# Patient Record
Sex: Female | Born: 1955 | Race: White | Hispanic: No | Marital: Married | Smoking: Former smoker
Health system: Western US, Academic
[De-identification: ages and names within clinical notes are randomized; demographics above are authoritative.]

## PROBLEM LIST (undated history)

## (undated) DIAGNOSIS — F419 Anxiety disorder, unspecified: Secondary | ICD-10-CM

## (undated) DIAGNOSIS — J45909 Unspecified asthma, uncomplicated: Secondary | ICD-10-CM

## (undated) DIAGNOSIS — I7300 Raynaud's syndrome without gangrene: Secondary | ICD-10-CM

## (undated) DIAGNOSIS — K219 Gastro-esophageal reflux disease without esophagitis: Secondary | ICD-10-CM

## (undated) DIAGNOSIS — I10 Essential (primary) hypertension: Secondary | ICD-10-CM

## (undated) DIAGNOSIS — B182 Chronic viral hepatitis C: Secondary | ICD-10-CM

## (undated) DIAGNOSIS — N951 Menopausal and female climacteric states: Secondary | ICD-10-CM

## (undated) DIAGNOSIS — J302 Other seasonal allergic rhinitis: Secondary | ICD-10-CM

## (undated) DIAGNOSIS — D582 Other hemoglobinopathies: Secondary | ICD-10-CM

## (undated) DIAGNOSIS — I341 Nonrheumatic mitral (valve) prolapse: Secondary | ICD-10-CM

## (undated) DIAGNOSIS — M349 Systemic sclerosis, unspecified: Secondary | ICD-10-CM

## (undated) DIAGNOSIS — Z9981 Dependence on supplemental oxygen: Secondary | ICD-10-CM

## (undated) DIAGNOSIS — K589 Irritable bowel syndrome without diarrhea: Secondary | ICD-10-CM

## (undated) DIAGNOSIS — J8410 Pulmonary fibrosis, unspecified: Secondary | ICD-10-CM

## (undated) DIAGNOSIS — B070 Plantar wart: Secondary | ICD-10-CM

## (undated) DIAGNOSIS — T7840XA Allergy, unspecified, initial encounter: Secondary | ICD-10-CM

## (undated) DIAGNOSIS — M545 Low back pain, unspecified: Secondary | ICD-10-CM

## (undated) DIAGNOSIS — M4310 Spondylolisthesis, site unspecified: Secondary | ICD-10-CM

## (undated) DIAGNOSIS — G4700 Insomnia, unspecified: Secondary | ICD-10-CM

## (undated) DIAGNOSIS — D751 Secondary polycythemia: Secondary | ICD-10-CM

## (undated) DIAGNOSIS — M1990 Unspecified osteoarthritis, unspecified site: Secondary | ICD-10-CM

## (undated) HISTORY — DX: Unspecified osteoarthritis, unspecified site: M19.90

## (undated) HISTORY — DX: Pulmonary fibrosis, unspecified: J84.10

## (undated) HISTORY — DX: Spondylolisthesis, site unspecified: M43.10

## (undated) HISTORY — DX: Chronic viral hepatitis C: B18.2

## (undated) HISTORY — DX: Irritable bowel syndrome, unspecified: K58.9

## (undated) HISTORY — PX: ABDOMINAL HYSTERECTOMY: SHX81

## (undated) HISTORY — DX: Low back pain, unspecified: M54.50

## (undated) HISTORY — DX: Anxiety disorder, unspecified: F41.9

## (undated) HISTORY — PX: VAGINAL HYSTERECTOMY: SHX2639

## (undated) HISTORY — DX: Menopausal and female climacteric states: N95.1

## (undated) HISTORY — DX: Nonrheumatic mitral (valve) prolapse: I34.1

## (undated) HISTORY — DX: Essential (primary) hypertension: I10

## (undated) HISTORY — DX: Unspecified asthma, uncomplicated: J45.909

## (undated) HISTORY — DX: Gastro-esophageal reflux disease without esophagitis: K21.9

## (undated) HISTORY — DX: Insomnia, unspecified: G47.00

## (undated) HISTORY — DX: Dependence on supplemental oxygen: Z99.81

## (undated) HISTORY — DX: Secondary polycythemia: D75.1

## (undated) HISTORY — DX: Allergy, unspecified, initial encounter: T78.40XA

## (undated) HISTORY — DX: Other hemoglobinopathies: D58.2

## (undated) HISTORY — DX: Systemic sclerosis, unspecified: M34.9

## (undated) HISTORY — DX: Low back pain: M54.5

## (undated) HISTORY — DX: Other seasonal allergic rhinitis: J30.2

## (undated) HISTORY — PX: LUNG TRANSPLANT, DOUBLE: SHX704

## (undated) HISTORY — DX: Plantar wart: B07.0

---

## 2003-10-29 ENCOUNTER — Other Ambulatory Visit: Payer: Self-pay

## 2004-12-23 ENCOUNTER — Ambulatory Visit: Payer: Self-pay

## 2005-01-31 ENCOUNTER — Ambulatory Visit: Payer: Self-pay

## 2005-07-09 ENCOUNTER — Encounter: Payer: Self-pay | Admitting: Family Medicine

## 2005-07-10 ENCOUNTER — Encounter: Payer: Self-pay | Admitting: Family Medicine

## 2005-08-07 ENCOUNTER — Encounter: Payer: Self-pay | Admitting: Family Medicine

## 2005-09-22 ENCOUNTER — Ambulatory Visit: Payer: Self-pay | Admitting: Family Medicine

## 2005-10-08 ENCOUNTER — Ambulatory Visit: Payer: Self-pay

## 2005-12-26 ENCOUNTER — Other Ambulatory Visit: Payer: Self-pay

## 2005-12-29 ENCOUNTER — Inpatient Hospital Stay: Payer: Self-pay | Admitting: Obstetrics and Gynecology

## 2007-07-22 IMAGING — US US PELV - US TRANSVAGINAL
1 series · 17 of 25 positions shown · non-contrast
Comparison: none

REASON FOR EXAM: Abd and Pelvic Pain
COMMENTS:

[Series 1: us pelv - us transvaginal · 17 of 46 slices shown]
[im 1/46]
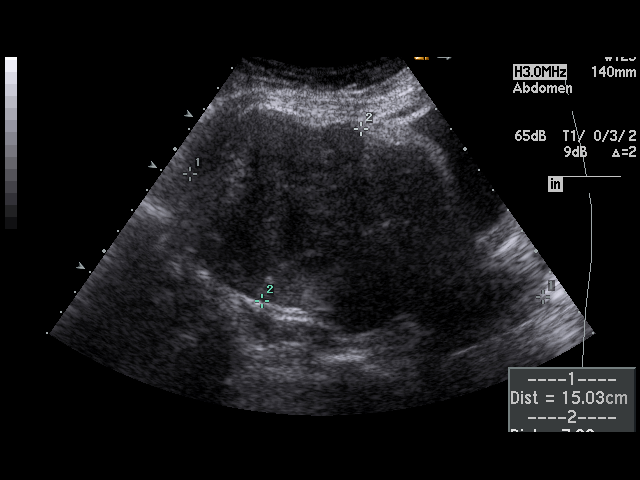
[im 4/46]
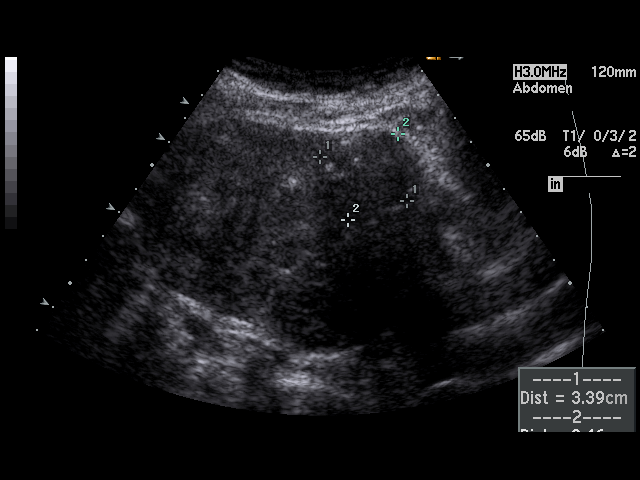
[im 6/46]
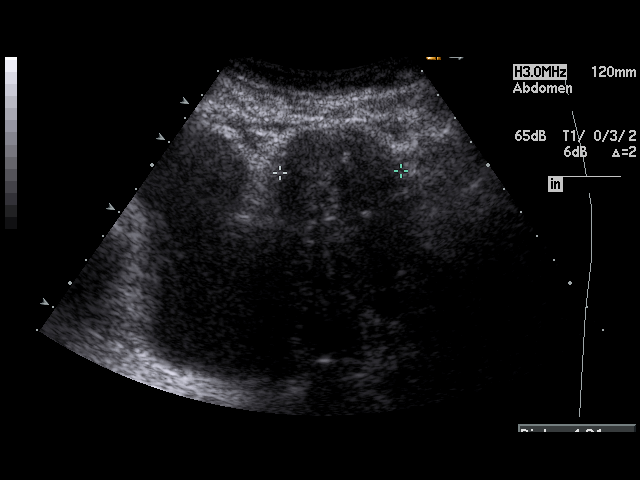
[im 10/46]
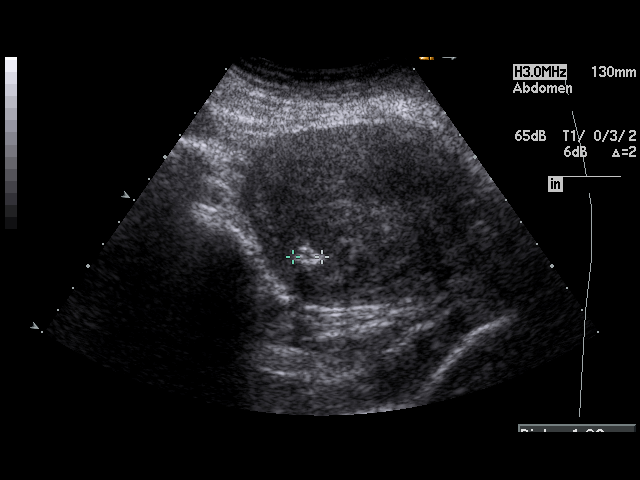
[im 12/46]
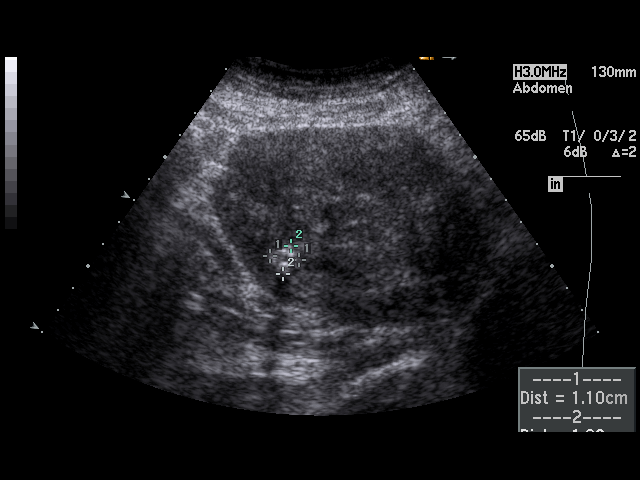
[im 16/46]
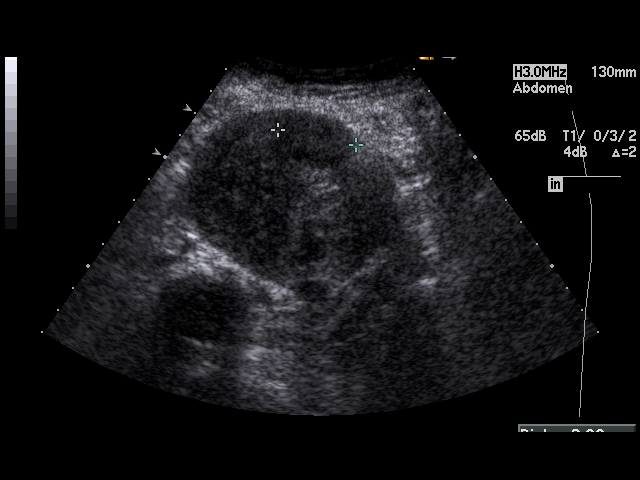
[im 17/46]
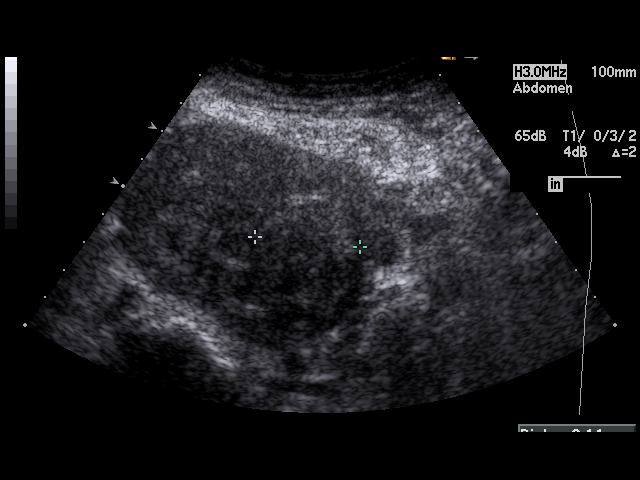
[im 21/46]
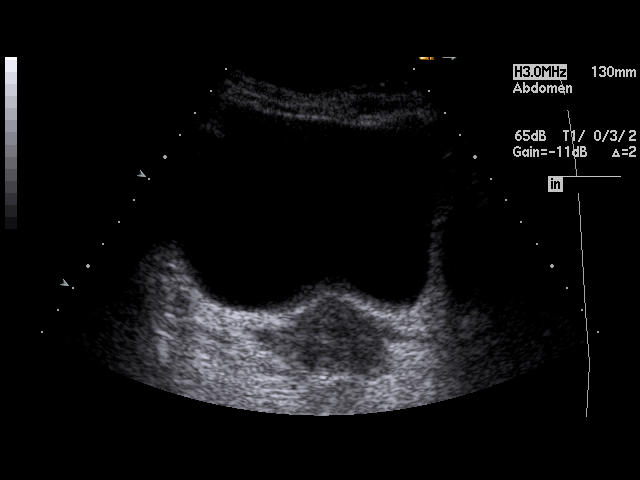
[im 23/46]
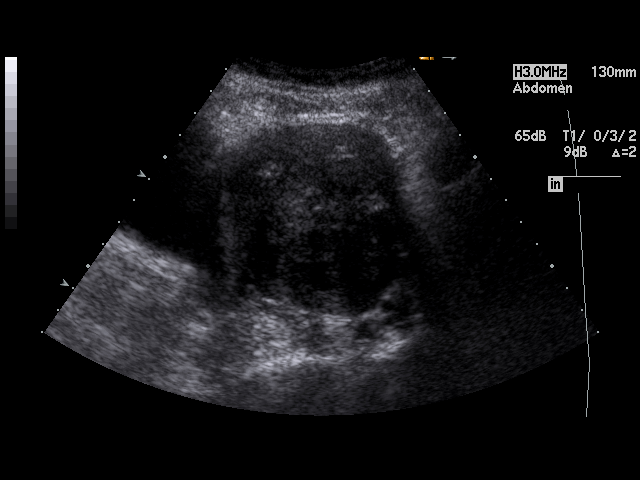
[im 25/46]
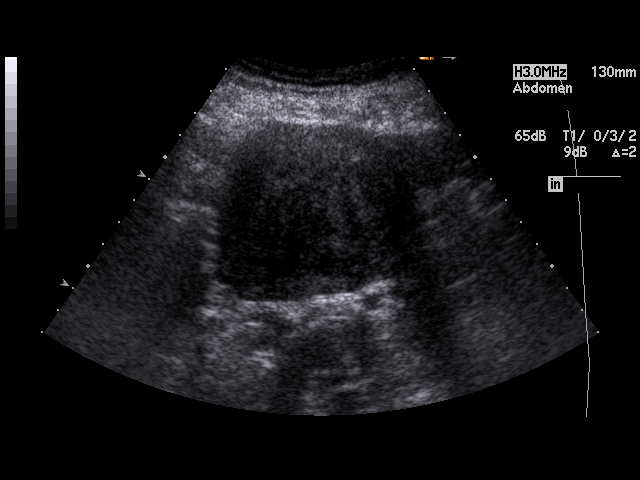
[im 29/46]
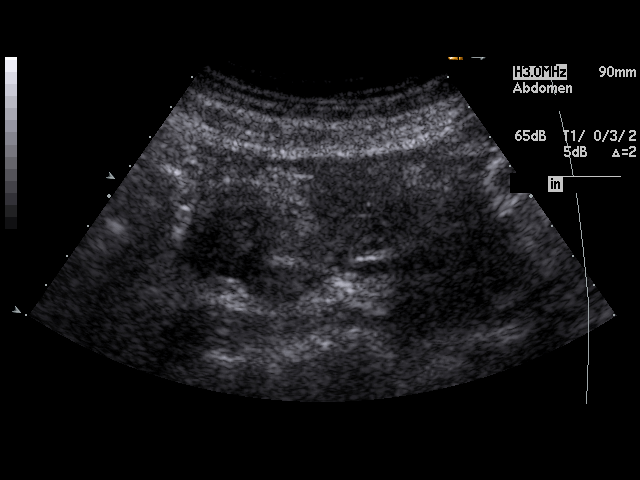
[im 31/46]
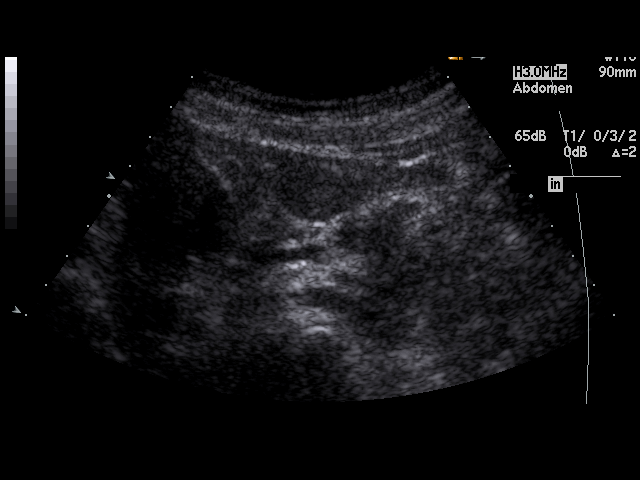
[im 34/46]
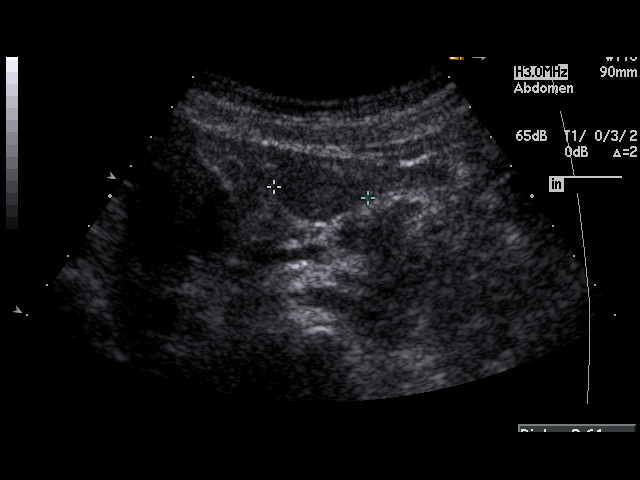
[im 36/46]
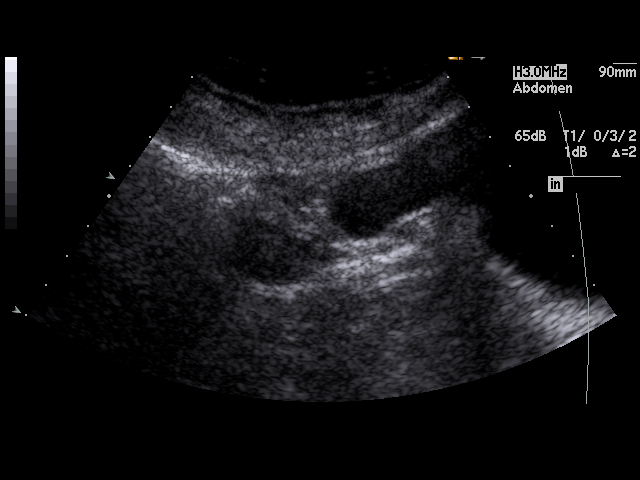
[im 40/46]
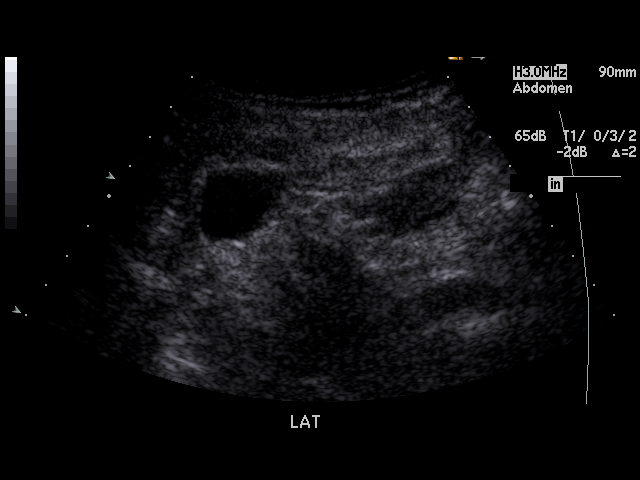
[im 42/46]
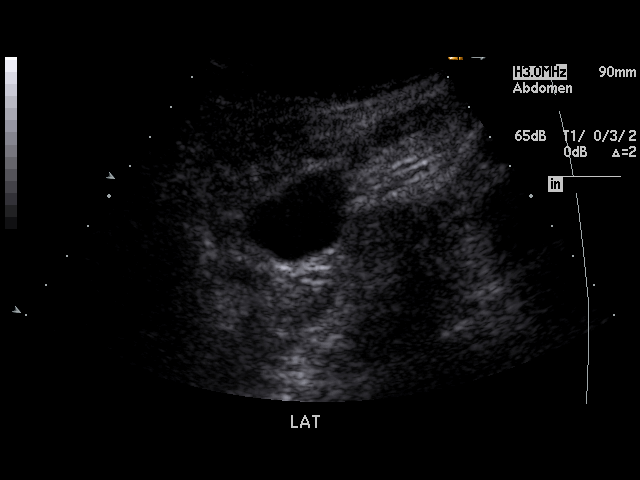
[im 46/46]
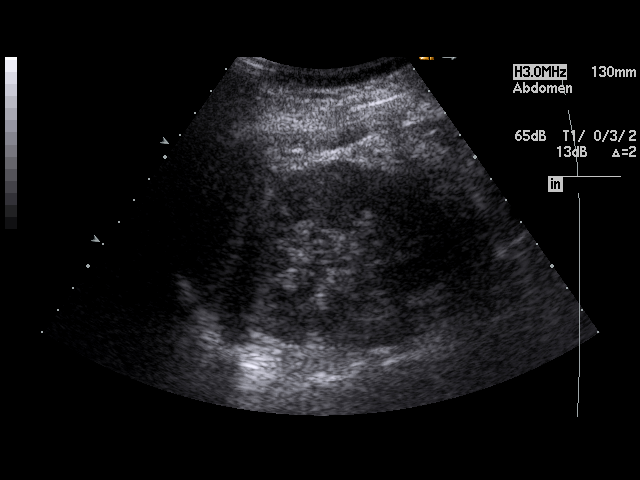

[17 of 25 positions shown; findings below may reference images not displayed]

PROCEDURE:     US  - US PELVIS MASS EXAM  - [DATE] [DATE] [DATE] [DATE]

RESULT:

REASON:  Abdominal and pelvic pain.
Ultrasound of the pelvis shows the uterus in the midline and measures 15 x 8
x 8.8 cm.  There are at least six separate fibroids noted by the
sonographer.  The largest of these measured approximately 6.8 x 3.9 cm.  The
endometrial stripe was normal at 7-mm.  The RIGHT ovary measures 2.7 x 1.7 x
2.8 cm and the LEFT ovary measures 2.1 x 1.7 x 2.6 cm.  No suspicious
adnexal mass is noted but there is evidence of anechoic structure lateral to
the LEFT ovary measuring 2.7 x 3.1 x 2.1 cm.  I am unsure what this may
represent.  It is perhaps a seroma or loculated fluid collection.  I would
recommend a follow-up ultrasound in 4-6 weeks to ensure resolution.
IMPRESSION: 1     Enlarged fibroid uterus.
2     No free fluid or suspicious adnexal mass is noted.
3     A 2.7 x 3.1 x 2.1 cm cystic lesion lateral to the LEFT ovary.
Follow-up study in 4-6 weeks is recommended to ensure resolution.

## 2007-07-30 DIAGNOSIS — N951 Menopausal and female climacteric states: Secondary | ICD-10-CM | POA: Insufficient documentation

## 2011-12-03 ENCOUNTER — Ambulatory Visit: Payer: Self-pay | Admitting: Internal Medicine

## 2011-12-03 LAB — CBC CANCER CENTER
Basophil %: 0.3 %
Eosinophil #: 0.3 x10 3/mm (ref 0.0–0.7)
HCT: 46.8 % (ref 35.0–47.0)
HGB: 15.9 g/dL (ref 12.0–16.0)
Lymphocyte #: 2 x10 3/mm (ref 1.0–3.6)
Lymphocyte %: 17.1 %
MCH: 30.5 pg (ref 26.0–34.0)
MCHC: 33.9 g/dL (ref 32.0–36.0)
MCV: 90 fL (ref 80–100)
Monocyte #: 0.7 x10 3/mm (ref 0.2–0.9)
Neutrophil #: 8.5 x10 3/mm — ABNORMAL HIGH (ref 1.4–6.5)
Platelet: 255 x10 3/mm (ref 150–440)
WBC: 11.6 x10 3/mm — ABNORMAL HIGH (ref 3.6–11.0)

## 2011-12-03 LAB — FERRITIN: Ferritin (ARMC): 238 ng/mL (ref 8–388)

## 2011-12-03 LAB — IRON AND TIBC: Iron Bind.Cap.(Total): 376 ug/dL (ref 250–450)

## 2011-12-08 ENCOUNTER — Ambulatory Visit: Payer: Self-pay | Admitting: Internal Medicine

## 2012-01-08 ENCOUNTER — Ambulatory Visit: Payer: Self-pay | Admitting: Internal Medicine

## 2013-07-14 LAB — LIPID PANEL
Cholesterol: 169 mg/dL (ref 0–200)
HDL: 58 mg/dL (ref 35–70)
LDL Cholesterol: 95 mg/dL
Triglycerides: 78 mg/dL (ref 40–160)

## 2013-07-21 ENCOUNTER — Ambulatory Visit: Payer: Self-pay | Admitting: Internal Medicine

## 2013-07-29 LAB — CBC CANCER CENTER
BASOS PCT: 0.5 %
Basophil #: 0.1 x10 3/mm (ref 0.0–0.1)
EOS PCT: 3.4 %
Eosinophil #: 0.4 x10 3/mm (ref 0.0–0.7)
HCT: 50.3 % — AB (ref 35.0–47.0)
HGB: 16.5 g/dL — AB (ref 12.0–16.0)
Lymphocyte #: 2.4 x10 3/mm (ref 1.0–3.6)
Lymphocyte %: 19.2 %
MCH: 29.1 pg (ref 26.0–34.0)
MCHC: 32.7 g/dL (ref 32.0–36.0)
MCV: 89 fL (ref 80–100)
MONOS PCT: 6.1 %
Monocyte #: 0.8 x10 3/mm (ref 0.2–0.9)
Neutrophil #: 8.8 x10 3/mm — ABNORMAL HIGH (ref 1.4–6.5)
Neutrophil %: 70.8 %
Platelet: 246 x10 3/mm (ref 150–440)
RBC: 5.65 10*6/uL — ABNORMAL HIGH (ref 3.80–5.20)
RDW: 13.5 % (ref 11.5–14.5)
WBC: 12.4 x10 3/mm — AB (ref 3.6–11.0)

## 2013-07-29 LAB — IRON AND TIBC
IRON SATURATION: 23 %
Iron Bind.Cap.(Total): 416 ug/dL (ref 250–450)
Iron: 97 ug/dL (ref 50–170)
Unbound Iron-Bind.Cap.: 319 ug/dL

## 2013-08-05 LAB — CANCER CENTER HEMATOCRIT: HCT: 42.5 % (ref 35.0–47.0)

## 2013-08-07 ENCOUNTER — Ambulatory Visit: Payer: Self-pay | Admitting: Internal Medicine

## 2013-08-17 ENCOUNTER — Ambulatory Visit: Payer: Self-pay | Admitting: Family Medicine

## 2013-08-19 LAB — CANCER CENTER HEMATOCRIT: HCT: 43.7 % (ref 35.0–47.0)

## 2013-09-03 ENCOUNTER — Inpatient Hospital Stay: Payer: Self-pay | Admitting: Internal Medicine

## 2013-09-03 LAB — URINALYSIS, COMPLETE
BACTERIA: NONE SEEN
Bilirubin,UR: NEGATIVE
Blood: NEGATIVE
Glucose,UR: NEGATIVE mg/dL (ref 0–75)
Ketone: NEGATIVE
Leukocyte Esterase: NEGATIVE
Nitrite: NEGATIVE
PH: 7 (ref 4.5–8.0)
Protein: NEGATIVE
Specific Gravity: 1.003 (ref 1.003–1.030)
Squamous Epithelial: 1
WBC UR: <1 /HPF (ref 0–5)

## 2013-09-03 LAB — COMPREHENSIVE METABOLIC PANEL
Albumin: 3.3 g/dL — ABNORMAL LOW (ref 3.4–5.0)
Alkaline Phosphatase: 106 U/L
Anion Gap: 5 — ABNORMAL LOW (ref 7–16)
BILIRUBIN TOTAL: 0.6 mg/dL (ref 0.2–1.0)
BUN: 10 mg/dL (ref 7–18)
CALCIUM: 8.4 mg/dL — AB (ref 8.5–10.1)
CHLORIDE: 104 mmol/L (ref 98–107)
CO2: 27 mmol/L (ref 21–32)
Creatinine: 1.02 mg/dL (ref 0.60–1.30)
EGFR (African American): >60
Glucose: 137 mg/dL — ABNORMAL HIGH (ref 65–99)
OSMOLALITY: 273 (ref 275–301)
Potassium: 3.3 mmol/L — ABNORMAL LOW (ref 3.5–5.1)
SGOT(AST): 39 U/L — ABNORMAL HIGH (ref 15–37)
SGPT (ALT): 30 U/L (ref 12–78)
Sodium: 136 mmol/L (ref 136–145)
TOTAL PROTEIN: 8.8 g/dL — AB (ref 6.4–8.2)

## 2013-09-03 LAB — CK TOTAL AND CKMB (NOT AT ARMC)
CK, Total: 173 U/L
CK-MB: 5.8 ng/mL — ABNORMAL HIGH (ref 0.5–3.6)

## 2013-09-03 LAB — CBC WITH DIFFERENTIAL/PLATELET
Basophil #: 0.1 10*3/uL (ref 0.0–0.1)
Basophil %: 0.5 %
EOS PCT: 0.8 %
Eosinophil #: 0.1 10*3/uL (ref 0.0–0.7)
HCT: 45.5 % (ref 35.0–47.0)
HGB: 15.2 g/dL (ref 12.0–16.0)
Lymphocyte #: 2.2 10*3/uL (ref 1.0–3.6)
Lymphocyte %: 12.8 %
MCH: 29.8 pg (ref 26.0–34.0)
MCHC: 33.4 g/dL (ref 32.0–36.0)
MCV: 89 fL (ref 80–100)
MONOS PCT: 6 %
Monocyte #: 1 x10 3/mm — ABNORMAL HIGH (ref 0.2–0.9)
NEUTROS PCT: 79.9 %
Neutrophil #: 14 10*3/uL — ABNORMAL HIGH (ref 1.4–6.5)
PLATELETS: 259 10*3/uL (ref 150–440)
RBC: 5.09 10*6/uL (ref 3.80–5.20)
RDW: 13.6 % (ref 11.5–14.5)
WBC: 17.5 10*3/uL — AB (ref 3.6–11.0)

## 2013-09-03 LAB — TROPONIN I
TROPONIN-I: 0.58 ng/mL — AB
TROPONIN-I: 0.65 ng/mL — AB

## 2013-09-03 LAB — PRO B NATRIURETIC PEPTIDE: B-Type Natriuretic Peptide: 1507 pg/mL — ABNORMAL HIGH (ref 0–125)

## 2013-09-03 LAB — MAGNESIUM: Magnesium: 2 mg/dL

## 2013-09-03 LAB — CK-MB: CK-MB: 5.5 ng/mL — ABNORMAL HIGH (ref 0.5–3.6)

## 2013-09-04 LAB — TROPONIN I: Troponin-I: 0.47 ng/mL — ABNORMAL HIGH

## 2013-09-04 LAB — CK-MB: CK-MB: 5 ng/mL — ABNORMAL HIGH (ref 0.5–3.6)

## 2013-09-05 LAB — BASIC METABOLIC PANEL
Anion Gap: 4 — ABNORMAL LOW (ref 7–16)
BUN: 8 mg/dL (ref 7–18)
Calcium, Total: 8.1 mg/dL — ABNORMAL LOW (ref 8.5–10.1)
Chloride: 110 mmol/L — ABNORMAL HIGH (ref 98–107)
Co2: 26 mmol/L (ref 21–32)
Creatinine: 1.01 mg/dL (ref 0.60–1.30)
EGFR (African American): >60
EGFR (Non-African Amer.): >60
GLUCOSE: 101 mg/dL — AB (ref 65–99)
OSMOLALITY: 278 (ref 275–301)
POTASSIUM: 3.6 mmol/L (ref 3.5–5.1)
Sodium: 140 mmol/L (ref 136–145)

## 2013-09-05 LAB — CBC WITH DIFFERENTIAL/PLATELET
Basophil #: 0 10*3/uL (ref 0.0–0.1)
Basophil %: 0.4 %
EOS PCT: 3.6 %
Eosinophil #: 0.4 10*3/uL (ref 0.0–0.7)
HCT: 39 % (ref 35.0–47.0)
HGB: 13 g/dL (ref 12.0–16.0)
LYMPHS PCT: 14.8 %
Lymphocyte #: 1.5 10*3/uL (ref 1.0–3.6)
MCH: 30.2 pg (ref 26.0–34.0)
MCHC: 33.5 g/dL (ref 32.0–36.0)
MCV: 90 fL (ref 80–100)
MONOS PCT: 9.9 %
Monocyte #: 1 x10 3/mm — ABNORMAL HIGH (ref 0.2–0.9)
NEUTROS PCT: 71.3 %
Neutrophil #: 7.3 10*3/uL — ABNORMAL HIGH (ref 1.4–6.5)
Platelet: 210 10*3/uL (ref 150–440)
RBC: 4.31 10*6/uL (ref 3.80–5.20)
RDW: 13.9 % (ref 11.5–14.5)
WBC: 10.2 10*3/uL (ref 3.6–11.0)

## 2013-09-07 ENCOUNTER — Ambulatory Visit: Payer: Self-pay | Admitting: Internal Medicine

## 2013-09-08 LAB — CULTURE, BLOOD (SINGLE)

## 2013-09-13 ENCOUNTER — Ambulatory Visit (INDEPENDENT_AMBULATORY_CARE_PROVIDER_SITE_OTHER): Payer: BC Managed Care – PPO | Admitting: Pulmonary Disease

## 2013-09-13 ENCOUNTER — Encounter: Payer: Self-pay | Admitting: Pulmonary Disease

## 2013-09-13 VITALS — BP 136/74 | HR 103 | Ht 62.0 in | Wt 118.0 lb

## 2013-09-13 DIAGNOSIS — J8410 Pulmonary fibrosis, unspecified: Secondary | ICD-10-CM | POA: Insufficient documentation

## 2013-09-13 DIAGNOSIS — J849 Interstitial pulmonary disease, unspecified: Secondary | ICD-10-CM

## 2013-09-13 DIAGNOSIS — J189 Pneumonia, unspecified organism: Secondary | ICD-10-CM

## 2013-09-13 DIAGNOSIS — J84112 Idiopathic pulmonary fibrosis: Secondary | ICD-10-CM | POA: Insufficient documentation

## 2013-09-13 DIAGNOSIS — D751 Secondary polycythemia: Secondary | ICD-10-CM

## 2013-09-13 DIAGNOSIS — J9610 Chronic respiratory failure, unspecified whether with hypoxia or hypercapnia: Secondary | ICD-10-CM

## 2013-09-13 DIAGNOSIS — R0902 Hypoxemia: Secondary | ICD-10-CM

## 2013-09-13 DIAGNOSIS — J9611 Chronic respiratory failure with hypoxia: Secondary | ICD-10-CM

## 2013-09-13 DIAGNOSIS — Z8701 Personal history of pneumonia (recurrent): Secondary | ICD-10-CM | POA: Insufficient documentation

## 2013-09-13 NOTE — Progress Notes (Signed)
Subjective:    Patient ID: Erika Martinez, female    DOB: 1955-10-30, 58 y.o.   MRN: [id number]  HPI  Erika Martinez has asthma and is here to see me because she was hospitalized a week ago for pneumonia.  A few weeks ago she started getting symptoms of a virus (diarrhea, nausea, vomiting) which made her feel pretty poorly.  She began noting that her heart was racing and pounding.  She was eventually hospitalized for pneumonia about a week after the virus.  She was told that she had sepsis and severe pneumonia.  She was treated with IV antibiotics and IVF and breathing treatments.  She received her regular asthma medicines.  She was told that her oxygen level was low during that visit. She never really felt terrible or short of breath during most of the hospitalization.  She never had a fever and never had a cough apart from her chronic cough due to asthma.  She did lose her appetite however.  She would note some dyspnea on exertion only.  She was hospitalized for so long because her oxygen level was low and wouldn't go up.  She actually had oxygen brought out to her house.  She wore the oxygen the first night and monitored her own oxygen saturation and it was normal.  After that she stared leaving the house without the oxygen and monitored her O2 saturation (personally bought a probe) and it has remained in the 90's with the exception of a few readings at 88%.  She now feels great.    She has had a chronic ocugh for years attributed to asthma, post nasal drip and acid reflux.  She has a lot of sinus drainage and she takes flonase and loratidine.  She also takes omeprazole daily.  She rarely notices heart burn.  She has had allergic rhinitis and allergies ever since childhood.  She was diagnosed with asthma when she moved into a house with carpet about 4-5 years ago.  She primary symptom of the asthma has been cough with mild dyspnea.  The dyspnea only occurred with the cough and never caused limitation of  activity.  She joined the gym last year and was able to exercise (bike) for 35-40 mintues witout dyspnea.    She has had Reynaud's since being an young adult.  Her hands will turn blue when cold.  She has been tested for lupus and other problems and it is normal.  She has recently been found to have polycythemia and has undergone phlebotomy recently at the [facility] cancer center.  Past Medical History  Diagnosis Date  . Asthma   . Mitral valve prolapse   . Seasonal allergies   . Hypertension   . Abnormal hemoglobin      Family History  Problem Relation Age of Onset  . Lung cancer Father      History   Social History  . Marital Status: Single    Spouse Name: N/A    Number of Children: N/A  . Years of Education: N/A   Occupational History  . Not on file.   Social History Main Topics  . Smoking status: Never Smoker   . Smokeless tobacco: Never Used  . Alcohol Use: 2.0 oz/week    4 drink(s) per week     Comment: "social drinker"  . Drug Use: No  . Sexual Activity: Not on file   Other Topics Concern  . Not on file   Social History Narrative  . No narrative  on file     Not on File   No outpatient prescriptions prior to visit.   No facility-administered medications prior to visit.      Review of Systems  Constitutional: Negative for fever and unexpected weight change.  HENT: Positive for congestion, postnasal drip and sinus pressure. Negative for dental problem, ear pain, nosebleeds, rhinorrhea, sneezing, sore throat and trouble swallowing.   Eyes: Negative for redness and itching.  Respiratory: Positive for cough and shortness of breath. Negative for chest tightness and wheezing.   Cardiovascular: Negative for palpitations and leg swelling.  Gastrointestinal: Negative for nausea and vomiting.  Genitourinary: Negative for dysuria.  Musculoskeletal: Negative for joint swelling.  Skin: Negative for rash.  Neurological: Negative for headaches.  Hematological:  Does not bruise/bleed easily.  Psychiatric/Behavioral: Negative for dysphoric mood. The patient is not nervous/anxious.        Objective:   Physical Exam Filed Vitals:   09/13/13 1411  BP: 136/74  Pulse: 103  Height: 5\' 2"  (1.575 m)  Weight: 118 lb (53.524 kg)  SpO2: 97%  RA  Ambulated 500 feet on RA and O2 saturation dropped to 86%, recovered well with 2L O2  Gen: well appearing, no acute distress HEENT: NCAT, PERRL, EOMi, OP clear, neck supple without masses PULM: CTA B CV: RRR, no mgr, no JVD AB: BS+, soft, nontender, no hsm Ext: warm, no edema, no clubbing, no cyanosis Derm: no rash or skin breakdown Neuro: A&Ox4, CN II-XII intact, strength 5/5 in all 4 extremities  Multiple Chest X-rays from 2007 were reviewed all demonstrating progressive peripheral based interstitial infiltrates and volume loss  The most recent CXR from March showed acute airspace disease bilaterally       Assessment & Plan:   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  Despite this she has minimal symptoms.    Today we discussed the possible etiologies.  In a woman her age with longstanding Reynaud's I would first consider NSIP or UIP related to a connective tissue disease.  Chronic HP may also be a possibility.  She may ultimately need a biopsy to reach a diagnosis.    Hopefully the recent episode of "pneumonia" was truly community acquired pneumonia and not a flare of NSIP.  Plan: -ILD serology work up -CT chest  -PFT -she has requested that we order these for one month from now as she recovers from pneumonia  CAP (community acquired pneumonia) Recovering well.  Plan: -repeat Chest imaging by the end of the month  Chronic hypoxemic respiratory failure I advised her to remain compliant with her O2 regimen of 2 L with exertion and qHS.  Polycythemia, secondary This problem is most likely secondary, will request  records from Hematology.   Updated Medication List Outpatient Encounter Prescriptions as of 09/13/2013  Medication Sig  . albuterol (PROVENTIL HFA;VENTOLIN HFA) 108 (90 BASE) MCG/ACT inhaler Inhale 2 puffs into the lungs every 4 (four) hours as needed for wheezing or shortness of breath.  . ALPRAZolam (XANAX) 0.5 MG tablet Take 0.5 mg by mouth 3 (three) times daily as needed for anxiety.  . cholecalciferol (VITAMIN D) 1000 UNITS tablet Take 2,000 Units by mouth daily.  Marland Kitchen diltiazem (CARDIZEM) 60 MG tablet Take 60 mg by mouth daily.  . fluticasone (FLONASE) 50 MCG/ACT nasal spray Place 2 sprays into both nostrils daily.  . Fluticasone-Salmeterol (ADVAIR DISKUS) 250-50 MCG/DOSE AEPB Inhale 1 puff into the lungs 2 (two) times daily.  Marland Kitchen  loratadine (LORATADINE ALLERGY RELIEF) 10 MG dissolvable tablet Take 10 mg by mouth daily.  Marland Kitchen omeprazole (PRILOSEC) 20 MG capsule Take 20 mg by mouth daily.  . vitamin B-12 (CYANOCOBALAMIN) 250 MCG tablet Place 250 mcg under the tongue daily.

## 2013-09-13 NOTE — Assessment & Plan Note (Signed)
This problem is most likely secondary, will request records from Hematology.

## 2013-09-13 NOTE — Assessment & Plan Note (Signed)
I advised her to remain compliant with her O2 regimen of 2 L with exertion and qHS.

## 2013-09-13 NOTE — Patient Instructions (Signed)
We will arrange a CT scan and lung function test in four weeks at [facility] We will arrange an overnight oximetry test on room air Come to our office for blood work in 3 weeks We will see you back in 4 weeks

## 2013-09-13 NOTE — Assessment & Plan Note (Signed)
Recovering well.  Plan: -repeat Chest imaging by the end of the month

## 2013-09-13 NOTE — Assessment & Plan Note (Signed)
Erika Martinez clearly has ILD and has had an abnormal Chest X-ray since 2007.  Despite this she has minimal symptoms.    Today we discussed the possible etiologies.  In a woman her age with longstanding Reynaud's I would first consider NSIP or UIP related to a connective tissue disease.  Chronic HP may also be a possibility.  She may ultimately need a biopsy to reach a diagnosis.    Hopefully the recent episode of "pneumonia" was truly community acquired pneumonia and not a flare of NSIP.  Plan: -ILD serology work up -CT chest  -PFT -she has requested that we order these for one month from now as she recovers from pneumonia

## 2013-09-16 LAB — CANCER CENTER HEMATOCRIT: HCT: 43.9 % (ref 35.0–47.0)

## 2013-10-03 ENCOUNTER — Encounter: Payer: Self-pay | Admitting: Pulmonary Disease

## 2013-10-03 ENCOUNTER — Telehealth: Payer: Self-pay

## 2013-10-03 NOTE — Telephone Encounter (Signed)
Dr Lake Bells, I spoke with Ms. Sarinana, she verified that her ONO was on room air.  She wanted me to know that her SOB comes from exertion, but she feels like she has no trouble when she is sleeping.  She is aware of the results of the ONO.

## 2013-10-03 NOTE — Telephone Encounter (Signed)
Message copied by Len Blalock on Mon Oct 03, 2013 11:48 AM ------      Message from: Simonne Maffucci B      Created: Mon Oct 03, 2013  2:35 AM       A,            Her ONO looked surprisingly good.  Can you confirm if this was performed off of oxygen?            Thanks      B ------

## 2013-10-06 ENCOUNTER — Ambulatory Visit: Payer: Self-pay | Admitting: Pulmonary Disease

## 2013-10-06 LAB — PULMONARY FUNCTION TEST

## 2013-10-07 ENCOUNTER — Ambulatory Visit: Payer: Self-pay | Admitting: Internal Medicine

## 2013-10-07 LAB — CBC CANCER CENTER
Basophil #: 0.1 x10 3/mm (ref 0.0–0.1)
Basophil %: 0.7 %
EOS ABS: 0.5 x10 3/mm (ref 0.0–0.7)
Eosinophil %: 3.4 %
HCT: 43 % (ref 35.0–47.0)
HGB: 14.6 g/dL (ref 12.0–16.0)
LYMPHS PCT: 18.3 %
Lymphocyte #: 2.5 x10 3/mm (ref 1.0–3.6)
MCH: 29.9 pg (ref 26.0–34.0)
MCHC: 34.1 g/dL (ref 32.0–36.0)
MCV: 88 fL (ref 80–100)
MONO ABS: 1 x10 3/mm — AB (ref 0.2–0.9)
Monocyte %: 7.5 %
NEUTROS ABS: 9.5 x10 3/mm — AB (ref 1.4–6.5)
NEUTROS PCT: 70.1 %
Platelet: 225 x10 3/mm (ref 150–440)
RBC: 4.9 10*6/uL (ref 3.80–5.20)
RDW: 13.2 % (ref 11.5–14.5)
WBC: 13.5 x10 3/mm — AB (ref 3.6–11.0)

## 2013-10-11 ENCOUNTER — Ambulatory Visit (INDEPENDENT_AMBULATORY_CARE_PROVIDER_SITE_OTHER): Payer: BC Managed Care – PPO | Admitting: Pulmonary Disease

## 2013-10-11 ENCOUNTER — Encounter: Payer: Self-pay | Admitting: Pulmonary Disease

## 2013-10-11 ENCOUNTER — Telehealth: Payer: Self-pay | Admitting: Pulmonary Disease

## 2013-10-11 VITALS — BP 148/88 | HR 56 | Ht 62.0 in | Wt 118.0 lb

## 2013-10-11 DIAGNOSIS — D751 Secondary polycythemia: Secondary | ICD-10-CM

## 2013-10-11 DIAGNOSIS — J9610 Chronic respiratory failure, unspecified whether with hypoxia or hypercapnia: Secondary | ICD-10-CM

## 2013-10-11 DIAGNOSIS — R0902 Hypoxemia: Secondary | ICD-10-CM

## 2013-10-11 DIAGNOSIS — J9611 Chronic respiratory failure with hypoxia: Secondary | ICD-10-CM

## 2013-10-11 DIAGNOSIS — J8410 Pulmonary fibrosis, unspecified: Secondary | ICD-10-CM

## 2013-10-11 MED ORDER — BENZONATATE 100 MG PO CAPS: 100.0000 mg | ORAL_CAPSULE | Freq: Three times a day (TID) | ORAL | Status: DC | PRN

## 2013-10-11 NOTE — Telephone Encounter (Signed)
Pt is wanting to know if BQ would mind giving her a prescription for Tessalon. She has had this in the past and has worked well to control her cough.  BQ - please advise. Thanks!

## 2013-10-11 NOTE — Assessment & Plan Note (Addendum)
Due to chronic hypoxemia from ILD I instructed her to use her oxygen regularly Continue F/U with [facility] oncology

## 2013-10-11 NOTE — Patient Instructions (Signed)
Please return for lab work in a month Use oxygen with exertion and at night We will see you back in 3 months

## 2013-10-11 NOTE — Assessment & Plan Note (Addendum)
She really needs to use her oxygen consistently with exertion.  I told her that if she travels soon I want her to use it on the plane.  Plan: -change oxygen company to [employer] -use 2L with exertion and qHS -Rx oxygen concentrator -obtain results of echocardiogram from 2015

## 2013-10-11 NOTE — Progress Notes (Signed)
Subjective:    Patient ID: Erika Martinez, female    DOB: 1955-10-03, 58 y.o.   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  2007 CXR > question ILD 08/2013 CXR > clear progression of interstitial infiltrate and volume loss 08/2013 cXR > pneumonia 10/06/2013 CT chest ([place] read) > compatible with UIP, mildly enlarged right paratracheal lymph node, likely old granulomatous disease, mild cardiomegaly 10/06/2013 pulmonary function test [facility]> flow volume loop compatible with obstruction, ratio 77%, FEV1 0.83 L (39% predicted, -4% change with bronchodilator), unable to perform lung volumes, DLCO 9.3 (54% predicted)  HPI 10/11/2013 ROV > Erika Martinez says that she feels fine throughout the day.  She goes shopping, get her hair down, gets her nails done and works full time without feeling terrible.  She can feel some shortness of breath and fatigue but she doesn't have to stop doing anything.  She never has to stop to rest, but she may slow her pace some.  She hasn't been back to the gym since the hospitzliation because she wasn't sure what I wanted her to do.  She had been doing as much as 45 minutes of time on the stationary bike as recent as 5 months ago.   She had a hard time with the lung function tests because she was worn out from the CT scan and she had an anxiety attack when they clamped her nose.  Past Medical History  Diagnosis Date  . Asthma   . Mitral valve prolapse   . Seasonal allergies   . Hypertension   . Abnormal hemoglobin      Review of Systems  Constitutional: Negative for fever, chills and fatigue.  HENT: Negative for postnasal drip, rhinorrhea and sinus pressure.   Respiratory: Positive for shortness of breath. Negative for cough and wheezing.   Cardiovascular: Negative for chest pain, palpitations and leg swelling.       Objective:    Physical Exam  Filed Vitals:   10/11/13 1528  BP: 148/88  Pulse: 56  Height: 5\' 2"  (1.575 m)  Weight: 118 lb (53.524 kg)  SpO2: 98%  RA  Ambulated 300 feet on RA and dropped to 85%, placed on 2LNC and O2 saturation remained > 92%  Gen: well appearing, no acute distress HEENT: NCAT, EOMi, OP clear PULM: Crackles in bases bilaterally  CV: RRR, no mgr, no JVD AB: BS+, soft, nontender, no hsm Ext: warm, no edema, no clubbing, no cyanosis Derm: no rash or skin breakdown       Assessment & Plan:   Postinflammatory pulmonary fibrosis Erika Martinez has classic UIP on her CT chest from 10/06/2013.  I doubt a biopsy would reveal anything different, but I do suspect that she has an underlying connective tissue since she has Raynaud's phenomena.  If labwork confirmed evidence of systemic inflammation or a specific connective tissue disease I would be inclined to treat with immunosuppressants rather than IPF medicines.  She has adapted to the disease remarkably well and was exercising as much as 45 minutes at a time on a stationary bike just 5 months ago.  She has a significant portion of lung that is well preserved (also consistent with UIP) and she has had evidence  Despite this, her oxygen needs show that her disease is not mild.    I explained to her that her ILD could take and unpredictable course and could suddenly  worsen.  Based on that she needs to stay in contact with [place].  I explained to her today that should she worsen I believe she would be a good transplant candidate.  Plan: -obtain ILD serology -based on labwork, consider starting treatment with prednisone/cellcept/bactrim -start pulmonary rehab -6 minute walk at pulmonary rehab -repeat PFT and 6MW at 6 months -likely will refer to [facility] this year to establish familiarity with them    Chronic hypoxemic respiratory failure She really needs to use her oxygen consistently with exertion.  I told her that if she  travels soon I want her to use it on the plane.  Plan: -change oxygen company to [employer] -use 2L with exertion and qHS -Rx oxygen concentrator -obtain results of echocardiogram from 2015  Polycythemia, secondary Due to chronic hypoxemia from ILD I instructed her to use her oxygen regularly Continue F/U with [facility] oncology   I spent greater than 35 minutes today in direct counseling with her  Updated Medication List Outpatient Encounter Prescriptions as of 10/11/2013  Medication Sig  . albuterol (PROVENTIL HFA;VENTOLIN HFA) 108 (90 BASE) MCG/ACT inhaler Inhale 2 puffs into the lungs every 4 (four) hours as needed for wheezing or shortness of breath.  . ALPRAZolam (XANAX) 0.5 MG tablet Take 0.5 mg by mouth 3 (three) times daily as needed for anxiety.  . cholecalciferol (VITAMIN D) 1000 UNITS tablet Take 2,000 Units by mouth daily.  Marland Kitchen diltiazem (CARDIZEM) 60 MG tablet Take 60 mg by mouth 2 (two) times daily.   . fluticasone (FLONASE) 50 MCG/ACT nasal spray Place 2 sprays into both nostrils daily.  . Fluticasone-Salmeterol (ADVAIR DISKUS) 250-50 MCG/DOSE AEPB Inhale 1 puff into the lungs 2 (two) times daily.  Marland Kitchen loratadine (LORATADINE ALLERGY RELIEF) 10 MG dissolvable tablet Take 10 mg by mouth daily.  Marland Kitchen omeprazole (PRILOSEC) 20 MG capsule Take 20 mg by mouth daily.  . vitamin B-12 (CYANOCOBALAMIN) 250 MCG tablet Place 250 mcg under the tongue daily.

## 2013-10-11 NOTE — Assessment & Plan Note (Addendum)
Erika Martinez has classic UIP on her CT chest from 10/06/2013.  I doubt a biopsy would reveal anything different, but I do suspect that she has an underlying connective tissue since she has Raynaud's phenomena.  If labwork confirmed evidence of systemic inflammation or a specific connective tissue disease I would be inclined to treat with immunosuppressants rather than IPF medicines.  She has adapted to the disease remarkably well and was exercising as much as 45 minutes at a time on a stationary bike just 5 months ago.  She has a significant portion of lung that is well preserved (also consistent with UIP) and she has had evidence  Despite this, her oxygen needs show that her disease is not mild.    I explained to her that her ILD could take and unpredictable course and could suddenly worsen.  Based on that she needs to stay in contact with [place].  I explained to her today that should she worsen I believe she would be a good transplant candidate.  Plan: -obtain ILD serology -based on labwork, consider starting treatment with prednisone/cellcept/bactrim -start pulmonary rehab -6 minute walk at pulmonary rehab -repeat PFT and 6MW at 6 months -likely will refer to [facility] this year to establish familiarity with them

## 2013-10-11 NOTE — Addendum Note (Signed)
Addended by: Len Blalock on: 10/11/2013 04:53 PM   Modules accepted: Orders

## 2013-10-11 NOTE — Telephone Encounter (Signed)
Pt is aware of BQ's recs. Rx has been sent in.

## 2013-10-11 NOTE — Telephone Encounter (Signed)
Fine by me Please order 100 mg by mouth every 8 hours when necessary cough dispense #50, refill 2

## 2013-10-12 ENCOUNTER — Telehealth: Payer: Self-pay | Admitting: Pulmonary Disease

## 2013-10-12 NOTE — Telephone Encounter (Signed)
Called and spoke with pt and she stated that she is scheduled to go to [place] June 13-21st and she stated that [place] told her that she would not be able to take the oxygen out of the country.  Pt is wanting to know what BQ feels that she should do about this.  BQ please advise. Thanks

## 2013-10-12 NOTE — Telephone Encounter (Signed)
Have her call the airline to ask their policy for this

## 2013-10-13 NOTE — Telephone Encounter (Signed)
I called spoke with pt. She reports she called airline and BQ will need to fill out form. She will drop this off. Nothing further needed

## 2013-10-24 ENCOUNTER — Telehealth: Payer: Self-pay | Admitting: Pulmonary Disease

## 2013-10-24 NOTE — Telephone Encounter (Signed)
ATC fast busy signal wcb 

## 2013-10-25 NOTE — Telephone Encounter (Signed)
Pt states that [employer] has not contacted her in regards to delivering her POC This was ordered 10/11/13 Note: Please change pt's DME referral to [facility], and set her up with a portable oxygen concentrator 2LPM continuous. Thank you  Pt states that she spoke with [employer] and that they were supposed to be contacting [employer] to get some information--this is the patients old [place]. Pt states that she has not heard anything further. ------- Spoke with Leafy Ro w/[employer]-- will contact our office back when she finds out what is going on.

## 2013-10-25 NOTE — Telephone Encounter (Signed)
[phone] returning a call

## 2013-10-25 NOTE — Telephone Encounter (Signed)
Per Leafy Ro, request for information was sent to [facility] instead of [employer].  Per Leafy Ro, pt was notified of this yesterday.  Request has been expedited so that patient will not have to wait a longer period of time for POC. Pt aware.

## 2013-10-25 NOTE — Telephone Encounter (Signed)
Mandy returning call.  [phone]

## 2013-10-25 NOTE — Telephone Encounter (Signed)
[email or web address].Jennifer Castillo, CMA  

## 2013-10-28 ENCOUNTER — Encounter: Payer: Self-pay | Admitting: Pulmonary Disease

## 2013-11-02 ENCOUNTER — Telehealth: Payer: Self-pay | Admitting: Pulmonary Disease

## 2013-11-02 ENCOUNTER — Ambulatory Visit: Payer: BC Managed Care – PPO | Admitting: Pulmonary Disease

## 2013-11-02 NOTE — Telephone Encounter (Signed)
Pt states she needs someone to contact [employer] about an order for a POC as patient is due to travel in a couple of weeks. She states a guy from [place] told her that it would take at least a month to get her a POC of her own and she can't afford to rent one.   I called [employer]-spoke with Estill Bamberg; she checked and told me that it takes about 1-2 months to get due to the patient needing to "bill" with then first(this requires [employer] to bill her insurance and get payment back from them prior to setting anyone up for a POC. They do have a "travel program" that the patient could set up with [employer]-which means they would assist her with her O2 needs while traveling and when she arrives.  I called patient back and explained the above to her; she will contact [place] to make arrangements. Nothing more needed from our office at this time.

## 2013-11-04 LAB — CANCER CENTER HEMATOCRIT: HCT: 43.2 % (ref 35.0–47.0)

## 2013-11-07 ENCOUNTER — Ambulatory Visit: Payer: Self-pay | Admitting: Internal Medicine

## 2013-11-08 ENCOUNTER — Encounter: Payer: Self-pay | Admitting: Pulmonary Disease

## 2013-11-08 ENCOUNTER — Ambulatory Visit (INDEPENDENT_AMBULATORY_CARE_PROVIDER_SITE_OTHER): Payer: BC Managed Care – PPO | Admitting: Pulmonary Disease

## 2013-11-08 VITALS — BP 138/74 | HR 103 | Ht 62.0 in | Wt 118.0 lb

## 2013-11-08 DIAGNOSIS — J9610 Chronic respiratory failure, unspecified whether with hypoxia or hypercapnia: Secondary | ICD-10-CM

## 2013-11-08 DIAGNOSIS — J8410 Pulmonary fibrosis, unspecified: Secondary | ICD-10-CM

## 2013-11-08 DIAGNOSIS — R0902 Hypoxemia: Secondary | ICD-10-CM

## 2013-11-08 DIAGNOSIS — J9611 Chronic respiratory failure with hypoxia: Secondary | ICD-10-CM

## 2013-11-08 NOTE — Progress Notes (Signed)
Subjective:    Patient ID: Erika Martinez, female    DOB: 1956/02/09, 58 y.o.   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  2007 CXR > question ILD 08/2013 CXR > clear progression of interstitial infiltrate and volume loss 08/2013 cXR > pneumonia 10/06/2013 CT chest ([place] read) > compatible with UIP, mildly enlarged right paratracheal lymph node, likely old granulomatous disease, mild cardiomegaly 10/06/2013 pulmonary function test [facility]> flow volume loop compatible with obstruction, ratio 77%, FEV1 0.83 L (39% predicted, -4% change with bronchodilator), unable to perform lung volumes, DLCO 9.3 (54% predicted)  HPI  11/08/2013> Erika Martinez is here today only because she needs to have me sign some form so that she can use a portable oxygen concentrator. She has been doing fine. She has no complaints today. She had a few questions about the use of her portable oxygen concentrator. She is going to be flying and she needed to know how much oxygen to use for that.  Past Medical History  Diagnosis Date  . Asthma   . Mitral valve prolapse   . Seasonal allergies   . Hypertension   . Abnormal hemoglobin      Review of Systems  Constitutional: Negative for fever, chills and fatigue.  HENT: Negative for postnasal drip, rhinorrhea and sinus pressure.   Respiratory: Positive for shortness of breath. Negative for cough and wheezing.   Cardiovascular: Negative for chest pain, palpitations and leg swelling.       Objective:   Physical Exam   Filed Vitals:   11/08/13 1617  BP: 138/74  Pulse: 103  Height: 5\' 2"  (1.575 m)  Weight: 118 lb (53.524 kg)  SpO2: 92%  RA    Gen: well appearing, no acute distress HEENT: NCAT, EOMi, OP clear PULM: Crackles in bases bilaterally  CV: RRR, no mgr, no JVD AB: BS+, soft, nontender, no hsm Ext: warm, no  edema, no clubbing, no cyanosis Derm: no rash or skin breakdown       Assessment & Plan:   Chronic hypoxemic respiratory failure She is here today so that I can sign forms for her to have a portable oxygen concentrator for upcoming travel. Otherwise she is stable. I answered a few questions about traveling using oxygen concentrator.  Plan: -Portable oxygen concentrator for travel -Continue followup with me as previously been documented.    Updated Medication List Outpatient Encounter Prescriptions as of 11/08/2013  Medication Sig  . albuterol (PROVENTIL HFA;VENTOLIN HFA) 108 (90 BASE) MCG/ACT inhaler Inhale 2 puffs into the lungs every 4 (four) hours as needed for wheezing or shortness of breath.  . ALPRAZolam (XANAX) 0.5 MG tablet Take 0.5 mg by mouth 3 (three) times daily as needed for anxiety.  . benzonatate (TESSALON) 100 MG capsule Take 1 capsule (100 mg total) by mouth every 8 (eight) hours as needed for cough.  . cholecalciferol (VITAMIN D) 1000 UNITS tablet Take 2,000 Units by mouth daily.  Marland Kitchen diltiazem (CARDIZEM) 60 MG tablet Take 60 mg by mouth 2 (two) times daily.   . fluticasone (FLONASE) 50 MCG/ACT nasal spray Place 2 sprays into both nostrils daily.  . Fluticasone-Salmeterol (ADVAIR DISKUS) 250-50 MCG/DOSE AEPB Inhale 1 puff into the lungs 2 (two) times daily.  Marland Kitchen loratadine (LORATADINE ALLERGY RELIEF) 10 MG dissolvable tablet Take 10 mg by mouth daily.  Marland Kitchen omeprazole (PRILOSEC) 20 MG capsule Take 20 mg by mouth  daily.  . vitamin B-12 (CYANOCOBALAMIN) 250 MCG tablet Place 250 mcg under the tongue daily.

## 2013-11-08 NOTE — Patient Instructions (Signed)
We will order a portable oxygen concentrator for you from [employer] Use the oxygen on the plane Come back to see [place] in about a month, we will order the bloodwork (be sure to let the nurse know that we need to order the blood test)

## 2013-11-08 NOTE — Assessment & Plan Note (Signed)
She is here today so that I can sign forms for her to have a portable oxygen concentrator for upcoming travel. Otherwise she is stable. I answered a few questions about traveling using oxygen concentrator.  Plan: -Portable oxygen concentrator for travel -Continue followup with me as previously been documented.

## 2013-11-16 ENCOUNTER — Encounter: Payer: Self-pay | Admitting: Pulmonary Disease

## 2013-12-05 ENCOUNTER — Other Ambulatory Visit: Payer: BC Managed Care – PPO

## 2013-12-06 ENCOUNTER — Encounter: Payer: Self-pay | Admitting: Pulmonary Disease

## 2013-12-07 ENCOUNTER — Encounter: Payer: Self-pay | Admitting: Pulmonary Disease

## 2013-12-14 ENCOUNTER — Encounter: Payer: Self-pay | Admitting: Pulmonary Disease

## 2013-12-20 ENCOUNTER — Telehealth: Payer: Self-pay | Admitting: Pulmonary Disease

## 2013-12-20 NOTE — Telephone Encounter (Signed)
Spoke with pt to verify that she came to [place] [facility] to have her labs drawn and had the incident as described below.  After verbally addressing this with BQ, I let the patient know that I would write up the labs and have them waiting up front for her at the [place] office as we would not be here for a few weeks.  Also, BQ verified that she is okay to keep her appt for September.  Pt is aware of this.  Nothing further needed at this time.

## 2013-12-20 NOTE — Telephone Encounter (Addendum)
Pt was supposed to have labs drawn per Dr Lake Bells Pt states that she went to [facility] office lab and the lab told her that there were too many labs ordered to be drawn and they were not able to identify a few of the tests Pt is requesting a handwritten/signed Rx with the labs listed that are ordered so that she may go to [place] to have these drawn. Pt is requesting to pick this up today from [place].  Pt also wants to know if she needs to make a follow visit at this time. Dr Anastasia Pall first available appt in [place] is in September. Not wanting to wait that long for OV unless okay to wait that long per BQ. Pt advised that she can come to [place] office to be seen--pt not sure about getting transportation to [place].  Spoke with Caryl Pina, made her aware of what the patient was requesting. Per Caryl Pina she will write Rx ad contact pt.  Caryl Pina, please advise on lab Rx. Thanks.  Please advise Dr Lake Bells / Caryl Pina if patient needs to schedule anything at this time or if patient can wait until September opening. Thanks.

## 2013-12-29 ENCOUNTER — Ambulatory Visit: Payer: Self-pay | Admitting: Internal Medicine

## 2014-01-07 ENCOUNTER — Encounter: Payer: Self-pay | Admitting: Pulmonary Disease

## 2014-01-26 ENCOUNTER — Encounter: Payer: Self-pay | Admitting: Pulmonary Disease

## 2014-01-26 ENCOUNTER — Other Ambulatory Visit: Payer: BC Managed Care – PPO

## 2014-01-26 ENCOUNTER — Encounter (INDEPENDENT_AMBULATORY_CARE_PROVIDER_SITE_OTHER): Payer: Self-pay

## 2014-01-26 ENCOUNTER — Ambulatory Visit (INDEPENDENT_AMBULATORY_CARE_PROVIDER_SITE_OTHER): Payer: BC Managed Care – PPO | Admitting: Pulmonary Disease

## 2014-01-26 VITALS — BP 138/76 | HR 96 | Ht 62.0 in | Wt 120.0 lb

## 2014-01-26 DIAGNOSIS — J8410 Pulmonary fibrosis, unspecified: Secondary | ICD-10-CM

## 2014-01-26 DIAGNOSIS — R0902 Hypoxemia: Secondary | ICD-10-CM

## 2014-01-26 DIAGNOSIS — J9611 Chronic respiratory failure with hypoxia: Secondary | ICD-10-CM

## 2014-01-26 DIAGNOSIS — J9610 Chronic respiratory failure, unspecified whether with hypoxia or hypercapnia: Secondary | ICD-10-CM

## 2014-01-26 NOTE — Assessment & Plan Note (Addendum)
This has been a stable interval for Erika Martinez. However, we have yet to complete the workup for the E. allergy of her interstitial lung disease. It appears that she has usual interstitial pneumonitis on CT scan but she has a strong history suggestive of a connective tissue disease of uncertain etiology.  She needs to be on a medication to slow the progression of her pulmonary fibrosis.  Plan: -Continue oxygen as written - Connective tissue disease serologic panel sent today -Followup in 4-6 weeks to discuss the results of the lab testing and we will then decide how we will proceed with medication.

## 2014-01-26 NOTE — Patient Instructions (Signed)
We will see you back after all the blood work is back Send [place] the results from your primary care doctors' blood work

## 2014-01-26 NOTE — Progress Notes (Signed)
Subjective:    Patient ID: Erika Martinez, female    DOB: [birth year], 58 y.o.   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  2007 CXR > question ILD 08/2013 CXR > clear progression of interstitial infiltrate and volume loss 08/2013 cXR > pneumonia 10/06/2013 CT chest ([place] read) > compatible with UIP, mildly enlarged right paratracheal lymph node, likely old granulomatous disease, mild cardiomegaly 10/06/2013 pulmonary function test [facility]> flow volume loop compatible with obstruction, ratio 77%, FEV1 0.83 L (39% predicted, -4% change with bronchodilator), unable to perform lung volumes, DLCO 9.3 (54% predicted) 12/09/2014 6MW > 990 feet, O2 saturation 80% on 2L > improved with 3L  HPI  01/26/2014 ROV > Erika Martinez went to [place] on vacation recently and she used oxygen the whole time.  She went with friends who helped her carry the oxygen.  She has been working out a lot with pulmonary rehab and has been feeling stronger there.  She uses 3L when on the treadmill but otherwise she only needs 2L.  She feels like this is going well.  She is not coughing more.  Overall she is feeling better.  She really thinks that the oxygen helps.  Past Medical History  Diagnosis Date  . Asthma   . Mitral valve prolapse   . Seasonal allergies   . Hypertension   . Abnormal hemoglobin      Review of Systems  Constitutional: Negative for fever, chills and fatigue.  HENT: Negative for postnasal drip, rhinorrhea and sinus pressure.   Respiratory: Positive for shortness of breath. Negative for cough and wheezing.   Cardiovascular: Negative for chest pain, palpitations and leg swelling.       Objective:   Physical Exam   Filed Vitals:   01/26/14 1554  BP: 138/76  Pulse: 96  Height: 5\' 2"  (1.575 m)  Weight: 120 lb (54.432 kg)  SpO2: 99%  2L [place]   Gen: well  appearing, no acute distress HEENT: NCAT, EOMi, OP clear PULM: Crackles in bases bilaterally  CV: RRR, no mgr, no JVD AB: BS+, soft, nontender, no hsm Ext: warm, no edema, no clubbing, no cyanosis Derm: no rash or skin breakdown       Assessment & Plan:   Postinflammatory pulmonary fibrosis This has been a stable interval for Erika Martinez. However, we have yet to complete the workup for the E. allergy of her interstitial lung disease. It appears that she has usual interstitial pneumonitis on CT scan but she has a strong history suggestive of a connective tissue disease of uncertain etiology.  She needs to be on a medication to slow the progression of her pulmonary fibrosis.  Plan: -Continue oxygen as written - Connective tissue disease serologic panel sent today -Followup in 4-6 weeks to discuss the results of the lab testing and we will then decide how we will proceed with medication.   Chronic hypoxemic respiratory failure Continue 2L O2 with exertion.    Updated Medication List Outpatient Encounter Prescriptions as of 01/26/2014  Medication Sig  . albuterol (PROVENTIL HFA;VENTOLIN HFA) 108 (90 BASE) MCG/ACT inhaler Inhale 2 puffs into the lungs every 4 (four) hours as needed for wheezing or shortness of breath.  . ALPRAZolam (XANAX) 0.5 MG tablet Take 0.5 mg by mouth 3 (three) times daily as needed for anxiety.  . benzonatate (TESSALON) 100 MG capsule Take 1 capsule (100 mg total) by  mouth every 8 (eight) hours as needed for cough.  . cholecalciferol (VITAMIN D) 1000 UNITS tablet Take 2,000 Units by mouth daily.  Marland Kitchen diltiazem (CARDIZEM) 60 MG tablet Take 60 mg by mouth daily.   . Fluticasone-Salmeterol (ADVAIR DISKUS) 250-50 MCG/DOSE AEPB Inhale 1 puff into the lungs 2 (two) times daily.  Marland Kitchen loratadine (LORATADINE ALLERGY RELIEF) 10 MG dissolvable tablet Take 10 mg by mouth daily.  Marland Kitchen omeprazole (PRILOSEC) 20 MG capsule Take 20 mg by mouth daily.  . vitamin B-12 (CYANOCOBALAMIN) 250  MCG tablet Place 250 mcg under the tongue daily.  . fluticasone (FLONASE) 50 MCG/ACT nasal spray Place 2 sprays into both nostrils daily.

## 2014-01-26 NOTE — Assessment & Plan Note (Signed)
Continue 2L O2 with exertion.

## 2014-01-27 LAB — CENTROMERE ANTIBODIES: CENTROMERE AB SCREEN: NEGATIVE

## 2014-01-27 LAB — ANTI-SCLERODERMA ANTIBODY: Scleroderma (Scl-70) (ENA) Antibody, IgG: <1

## 2014-01-27 LAB — CYCLIC CITRUL PEPTIDE ANTIBODY, IGG: Cyclic Citrullin Peptide Ab: <2 U/mL (ref 0.0–5.0)

## 2014-01-27 LAB — SJOGRENS SYNDROME-B EXTRACTABLE NUCLEAR ANTIBODY: SSB (La) (ENA) Antibody, IgG: <1

## 2014-01-27 LAB — SJOGRENS SYNDROME-A EXTRACTABLE NUCLEAR ANTIBODY: SSA (RO) (ENA) ANTIBODY, IGG: NEGATIVE

## 2014-02-01 ENCOUNTER — Ambulatory Visit: Payer: Self-pay | Admitting: Internal Medicine

## 2014-02-01 LAB — CANCER CENTER HEMATOCRIT: HCT: 42.7 % (ref 35.0–47.0)

## 2014-02-02 ENCOUNTER — Encounter: Payer: Self-pay | Admitting: Pulmonary Disease

## 2014-02-03 ENCOUNTER — Telehealth: Payer: Self-pay | Admitting: Pulmonary Disease

## 2014-02-03 ENCOUNTER — Encounter: Payer: Self-pay | Admitting: Pulmonary Disease

## 2014-02-03 NOTE — Telephone Encounter (Signed)
So far her labs look OK, but we need to see the ones from her PCP, haven't received yet

## 2014-02-03 NOTE — Telephone Encounter (Signed)
Called and spoke with pt and she is aware of lab results per BQ.  Pt was given the fax number and she will call her PCP and have these labs faxed over to the office.

## 2014-02-03 NOTE — Telephone Encounter (Signed)
Called and spoke to pt. Informed pt that BQ will review the results of the auto immune labs that were drawn here and then will call back with the results. Pt also questioning if we received the results from her PCP, labs drawn at [place]. Will forward to [place] if she has seen the results faxed in from [place].   BQ please advise on the results.

## 2014-02-07 ENCOUNTER — Telehealth: Payer: Self-pay

## 2014-02-07 ENCOUNTER — Ambulatory Visit: Payer: Self-pay | Admitting: Internal Medicine

## 2014-02-07 ENCOUNTER — Encounter: Payer: Self-pay | Admitting: Pulmonary Disease

## 2014-02-07 DIAGNOSIS — J8410 Pulmonary fibrosis, unspecified: Secondary | ICD-10-CM

## 2014-02-07 NOTE — Telephone Encounter (Signed)
Spoke with pt, she is aware of results and recs.  Referral sent to rheumatology.  Nothing further needed.

## 2014-02-07 NOTE — Telephone Encounter (Signed)
lmtcb X1 to relay results/recs.

## 2014-02-07 NOTE — Telephone Encounter (Signed)
Pt returned call [phone]

## 2014-02-07 NOTE — Telephone Encounter (Signed)
Message copied by Len Blalock on Tue Feb 07, 2014  1:31 PM ------      Message from: Juanito Doom      Created: Fri Feb 03, 2014  4:45 PM       A,            Please let her know that I was able to review her labs from Dr. Ancil Boozer' office.  The lab for rheumatoid arthritis came back positive.  This does not mean that she has rheumatoid arthritis and may just be a false positive.  However, she needs to be seen by a rheumatologist just to be sure. Please set up an appointment with Dr. Jefm Bryant in [place].            Thanks      B ------

## 2014-02-22 ENCOUNTER — Encounter: Payer: Self-pay | Admitting: Pulmonary Disease

## 2014-02-22 ENCOUNTER — Ambulatory Visit (INDEPENDENT_AMBULATORY_CARE_PROVIDER_SITE_OTHER): Payer: BC Managed Care – PPO | Admitting: Pulmonary Disease

## 2014-02-22 VITALS — BP 132/72 | HR 101 | Ht 62.0 in | Wt 120.0 lb

## 2014-02-22 DIAGNOSIS — R0902 Hypoxemia: Secondary | ICD-10-CM

## 2014-02-22 DIAGNOSIS — J9610 Chronic respiratory failure, unspecified whether with hypoxia or hypercapnia: Secondary | ICD-10-CM

## 2014-02-22 DIAGNOSIS — J9611 Chronic respiratory failure with hypoxia: Secondary | ICD-10-CM

## 2014-02-22 DIAGNOSIS — J8410 Pulmonary fibrosis, unspecified: Secondary | ICD-10-CM

## 2014-02-22 MED ORDER — PREDNISONE 10 MG PO TABS: ORAL_TABLET | ORAL | Status: DC

## 2014-02-22 NOTE — Assessment & Plan Note (Addendum)
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 Jefm Bryant that there is some suspicion for scleroderma.  However all of her serology was negative and she has not other supporting evidence right now.  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  Today we had a long discussion about her condition.  She has read a lot about pulmonary fibrosis and understands the difference between connective tissue disease associated UIP and IPF.  She knows that both are treated differently.  The significance of this lies in the form of treatment.  However, she has been very slow to let me start treatment of any kind.    Plan: - I would like to try a trial of prednisone for three months with objective measurements (PFT/6MW) before and after the medication to see if this is a steroid responsive process - if no response to steroid then will start pirfenidone or nintedamib - Will refer to [facility] as connective tissue disease associated ILD can progress rapidly and I want her to be connected with the transplant team should that be needed - continue pulmonary rehab - will need an echo this year to screen for pulmonary hypertension - f/u three months

## 2014-02-22 NOTE — Progress Notes (Signed)
Subjective:    Patient ID: Erika Martinez, female    DOB: [birth year], 58 y.o.   MRN: [id number]  Synopsis: Ms Boan has connective tissue disease associated UIP and she first saw [facility] in 2015 after a hospitalization for hypoxemic respiratory failure.  2007 CXR > question ILD 08/2013 CXR > clear progression of interstitial infiltrate and volume loss 08/2013 cXR > pneumonia 10/06/2013 CT chest ([place] read) > compatible with UIP, mildly enlarged right paratracheal lymph node, likely old granulomatous disease, mild cardiomegaly 10/06/2013 pulmonary function test [facility]> flow volume loop compatible with obstruction, ratio 77%, FEV1 0.83 L (39% predicted, -4% change with bronchodilator), unable to perform lung volumes, DLCO 9.3 (54% predicted) 12/09/2014 6MW > 990 feet, O2 saturation 80% on 2L > improved with 3L 01/2014 ANA neg, RF positive 44.7, SCL-70 neg, SSA neg, SSB neg, CCP negative, adolase normal, CRP 0.35 (normal)  HPI  02/22/14 ROV> Emmogene has been doing [place] since I saw her the last time.  She has been really tired with pulmonary rehab.  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  She can tell a difference when she stays off oxygen during the daytime in that she feels more fatigued at the end of the day.  She has not been coughing much.  She says that she is getting stronger at pulmonary rehab and has really enjoyed it.  She continues to use her oxygen concentrator and benefits from it.  No leg swelling, no chest pain.  Her weight has been stable.  Past Medical History  Diagnosis Date  . Asthma   . Mitral valve prolapse   . Seasonal allergies   . Hypertension   . Abnormal hemoglobin      Review of Systems  Constitutional: Negative for fever, chills and fatigue.  HENT: Negative for postnasal drip, rhinorrhea and sinus pressure.   Respiratory: Positive for shortness of breath. Negative for cough and wheezing.   Cardiovascular: Negative for chest  pain, palpitations and leg swelling.       Objective:   Physical Exam   Filed Vitals:   02/22/14 1615  BP: 132/72  Pulse: 101  Height: 5\' 2"  (1.575 m)  Weight: 120 lb (54.432 kg)  SpO2: 94%  2L [place]   Gen: well appearing, no acute distress HEENT: NCAT, EOMi, OP clear PULM: Crackles in bases bilaterally  CV: RRR, no mgr, no JVD AB: BS+, soft, nontender, no hsm Ext: warm, no edema, no clubbing, no cyanosis Derm: no rash or skin breakdown       Assessment & Plan:   UIP (usual interstitial pneumonitis) Based on her longstanding Reynaud's symptoms I agree with Dr. Jefm Bryant that there is some suspicion for scleroderma.  However all of her serology was negative and she has not other supporting evidence right now.  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  Today we had a long discussion about her condition.  She has read a lot about pulmonary fibrosis and understands the difference between connective tissue disease associated UIP and IPF.  She knows that both are treated differently.  The significance of this lies in the form of treatment.  However, she has been very slow to let me start treatment of any kind.    Plan: - I would like to try a trial of prednisone for three months with objective measurements (PFT/6MW) before and after the medication to see if  this is a steroid responsive process - if no response to steroid then will start pirfenidone or nintedamib - Will refer to [facility] as connective tissue disease associated ILD can progress rapidly and I want her to be connected with the transplant team should that be needed - continue pulmonary rehab - f/u three months  Chronic hypoxemic respiratory failure Continue 2 L with exertion and qHS    Updated Medication List Outpatient Encounter Prescriptions as of 02/22/2014  Medication Sig  . albuterol (PROVENTIL HFA;VENTOLIN HFA) 108 (90 BASE) MCG/ACT  inhaler Inhale 2 puffs into the lungs every 4 (four) hours as needed for wheezing or shortness of breath.  . ALPRAZolam (XANAX) 0.5 MG tablet Take 0.5 mg by mouth 3 (three) times daily as needed for anxiety.  . cholecalciferol (VITAMIN D) 1000 UNITS tablet Take 2,000 Units by mouth daily.  Marland Kitchen diltiazem (CARDIZEM) 60 MG tablet Take 60 mg by mouth daily.   . fluticasone (FLONASE) 50 MCG/ACT nasal spray Place 2 sprays into both nostrils daily as needed.   . Fluticasone-Salmeterol (ADVAIR DISKUS) 250-50 MCG/DOSE AEPB Inhale 1 puff into the lungs 2 (two) times daily.  Marland Kitchen loratadine (LORATADINE ALLERGY RELIEF) 10 MG dissolvable tablet Take 10 mg by mouth daily.  Marland Kitchen omeprazole (PRILOSEC) 20 MG capsule Take 20 mg by mouth daily.  . vitamin B-12 (CYANOCOBALAMIN) 250 MCG tablet Place 250 mcg under the tongue daily.  . benzonatate (TESSALON) 100 MG capsule Take 1 capsule (100 mg total) by mouth every 8 (eight) hours as needed for cough.  . predniSONE (DELTASONE) 10 MG tablet Prednisone 30mg  daily for one month, then 20mg  daily for one month, then 10mg  daily for one month

## 2014-02-22 NOTE — Assessment & Plan Note (Signed)
Continue 2 L with exertion and qHS

## 2014-02-22 NOTE — Patient Instructions (Signed)
We will arrange a pulmonary function test next week and three months from now Keep using your exercise as regularly Start taking the prednisone next week, take 30mg  daily for a month, 20mg  daily for a month, then 10mg  daily for a month We will see you back in 3 months or sooner if needed

## 2014-02-28 ENCOUNTER — Telehealth: Payer: Self-pay | Admitting: Pulmonary Disease

## 2014-02-28 NOTE — Telephone Encounter (Signed)
Spoke with the pt She is asking if okay to take her xanax if needed while on pred  I advised that yes, this is fine  Nothing further needed

## 2014-03-01 ENCOUNTER — Telehealth: Payer: Self-pay | Admitting: Pulmonary Disease

## 2014-03-01 NOTE — Telephone Encounter (Signed)
Pt returned call [phone]

## 2014-03-01 NOTE — Telephone Encounter (Signed)
lmtcb

## 2014-03-01 NOTE — Telephone Encounter (Signed)
lmomtcb x1 

## 2014-03-02 NOTE — Telephone Encounter (Signed)
Called spoke with pt/ she cancelled appt for PFT today and r/s next Thursday. Pt reports she is having some allergy problems and wanted the test to be accurate. She needed nothing further

## 2014-03-05 ENCOUNTER — Encounter: Payer: Self-pay | Admitting: Pulmonary Disease

## 2014-03-06 ENCOUNTER — Telehealth: Payer: Self-pay

## 2014-03-06 NOTE — Telephone Encounter (Signed)
Message copied by Len Blalock on Mon Mar 06, 2014  3:40 PM ------      Message from: Juanito Doom      Created: Sun Mar 05, 2014  2:43 AM       A,      Please let her know that I am concerned about how low her O2 dropped with her 6MW at [facility].  It showed that she needs more than 3L with walking.  Please arrange for a home O2 eval with one of the DME companies so we can sort out how much she needs.  If that isn't possible then she may need a nurse visit in the office.            Thanks      B ------

## 2014-03-06 NOTE — Telephone Encounter (Signed)
Spoke with pt to relay results and recs.  She states that she had a stomach virus that day and was not feeling well, and had not worn her 02 until she arrived to [facility] to do the walk test.  She would prefer not to have her dme company come out.  She states she's been monitoring her 02 at home with her pulse oximeter and states that she normally stays above 90% on 2lpm pulse, and has always used 3lpm when she's at pulmonary rehab.  She states she would be willing to do walk in the office at her next visit.  Just forwarding as an fyi.

## 2014-03-09 ENCOUNTER — Ambulatory Visit: Payer: Self-pay | Admitting: Pulmonary Disease

## 2014-03-13 ENCOUNTER — Encounter: Payer: Self-pay | Admitting: Pulmonary Disease

## 2014-03-14 ENCOUNTER — Telehealth: Payer: Self-pay

## 2014-03-14 NOTE — Telephone Encounter (Signed)
Pt aware of results and recs.  Is taking pred taper as prescribed.  We referred pt to lung transplant clinic at last visit, [facility] is currently getting info from pt's other providers per pt.  Just forwarding as an fyi.

## 2014-03-14 NOTE — Telephone Encounter (Signed)
Message copied by Len Blalock on Tue Mar 14, 2014  5:30 PM ------      Message from: Juanito Doom      Created: Mon Mar 13, 2014  8:33 PM       A,      Please let her know that her PFTs are a little worse than the last ones we did.  This tells me she needs to be on the prednisone I prescribed and needs to see the lung transplant clinic.            Thanks      B ------

## 2014-03-24 ENCOUNTER — Telehealth: Payer: Self-pay | Admitting: Pulmonary Disease

## 2014-03-24 NOTE — Telephone Encounter (Signed)
Spoke with the pt  She states that she was seen by her PCP yesterday for sores in her mouth which they told her was thrush  She states that she thinks it is coming from taking prednisone  She was given "some sort of mouth rinse and a once a wk fungal pill"- does not know the name of either med  PCP told her to ask BQ if she needs to go ahead and take the "fungal pill" since he has pt on a "long list of meds" Please advise thanks!

## 2014-03-24 NOTE — Telephone Encounter (Signed)
Called work number and pt had already left  LMTCB on her mobile

## 2014-03-24 NOTE — Telephone Encounter (Signed)
Spoke with the pt and notified of recs per BQ  She verbalized understanding  Nothing further needed 

## 2014-03-24 NOTE — Telephone Encounter (Signed)
Fluconazole 200mg  po x1 is OK

## 2014-04-13 ENCOUNTER — Telehealth: Payer: Self-pay | Admitting: Pulmonary Disease

## 2014-04-13 MED ORDER — FLUTICASONE-SALMETEROL 250-50 MCG/DOSE IN AEPB: 1.0000 | INHALATION_SPRAY | Freq: Two times a day (BID) | RESPIRATORY_TRACT | Status: DC

## 2014-04-13 NOTE — Telephone Encounter (Signed)
Called spoke with pt. Aware advair refilled. Nothing further needed

## 2014-04-14 ENCOUNTER — Other Ambulatory Visit: Payer: Self-pay

## 2014-04-14 MED ORDER — FLUTICASONE-SALMETEROL 250-50 MCG/DOSE IN AEPB: 1.0000 | INHALATION_SPRAY | Freq: Two times a day (BID) | RESPIRATORY_TRACT | Status: DC

## 2014-04-19 ENCOUNTER — Ambulatory Visit: Payer: Self-pay | Admitting: Internal Medicine

## 2014-04-19 LAB — CBC CANCER CENTER
Basophil #: 0.1 x10 3/mm (ref 0.0–0.1)
Basophil %: 0.5 %
EOS ABS: 0 x10 3/mm (ref 0.0–0.7)
Eosinophil %: 0.1 %
HCT: 45.8 % (ref 35.0–47.0)
HGB: 15 g/dL (ref 12.0–16.0)
LYMPHS ABS: 1 x10 3/mm (ref 1.0–3.6)
Lymphocyte %: 6 %
MCH: 30.8 pg (ref 26.0–34.0)
MCHC: 32.6 g/dL (ref 32.0–36.0)
MCV: 95 fL (ref 80–100)
MONO ABS: 0.3 x10 3/mm (ref 0.2–0.9)
MONOS PCT: 2 %
NEUTROS ABS: 14.9 x10 3/mm — AB (ref 1.4–6.5)
Neutrophil %: 91.4 %
Platelet: 247 x10 3/mm (ref 150–440)
RBC: 4.85 10*6/uL (ref 3.80–5.20)
RDW: 14 % (ref 11.5–14.5)
WBC: 16.3 x10 3/mm — ABNORMAL HIGH (ref 3.6–11.0)

## 2014-05-09 ENCOUNTER — Ambulatory Visit: Payer: Self-pay | Admitting: Internal Medicine

## 2014-05-15 ENCOUNTER — Other Ambulatory Visit: Payer: Self-pay | Admitting: *Deleted

## 2014-05-15 ENCOUNTER — Telehealth: Payer: Self-pay | Admitting: Pulmonary Disease

## 2014-05-15 MED ORDER — FLUTICASONE-SALMETEROL 250-50 MCG/DOSE IN AEPB: 1.0000 | INHALATION_SPRAY | Freq: Two times a day (BID) | RESPIRATORY_TRACT | Status: DC

## 2014-05-15 NOTE — Telephone Encounter (Signed)
Patient notified that 5 refills were sent to the pharmacy on 11/6.  Patient was concerned that her insurance company may be causing the delay.  I advised patient that we have not received anything from her insurance company, that if she is concerned about whether they will pay for the Advair, she needs to call them and confirm if they will pay for it or if she needs a prior authorization.  She said she would have the pharmacy run her card again and see if she can get it paid for.  If not, she will let us know.

## 2014-05-17 ENCOUNTER — Telehealth: Payer: Self-pay | Admitting: Pulmonary Disease

## 2014-05-17 NOTE — Telephone Encounter (Signed)
Called and spoke to pt. Pt stated she got a coupon on the advair website and got the advair for $50 instead of $100. Pt stated she not longer needs anything. Will sign off.

## 2014-05-31 ENCOUNTER — Telehealth: Payer: Self-pay | Admitting: Pulmonary Disease

## 2014-05-31 MED ORDER — PREDNISONE 10 MG PO TABS: 10.0000 mg | ORAL_TABLET | Freq: Every day | ORAL | Status: DC

## 2014-05-31 NOTE — Telephone Encounter (Signed)
Prednisone sent in.  lmtcb X1 to make pt aware.

## 2014-05-31 NOTE — Telephone Encounter (Signed)
Fine by me 

## 2014-05-31 NOTE — Telephone Encounter (Signed)
Called and spoke to pt. Pt requesting to extend the 10 mg pred qd for an additional 8 days till appt with BQ. Pt is scheduled to run out of pred on 06/06/14. Pt stated she had an URI and was given abx and completed the abx and is feeling better. Pt is wanting the extension because of the URI she had the week of 05/22/14. Pt has pending appt with BQ on 06/14/13  Dr. Pennie Banter please advise.   Last OV- 02/22/14 Patient Instructions     We will arrange a pulmonary function test next week and three months from now Keep using your exercise as regularly Start taking the prednisone next week, take 30mg  daily for a month, 20mg  daily for a month, then 10mg  daily for a month We will see you back in 3 months or sooner if needed

## 2014-06-01 NOTE — Telephone Encounter (Signed)
Pt is aware medication has been sent in. Nothing further was needed.

## 2014-06-14 ENCOUNTER — Ambulatory Visit (INDEPENDENT_AMBULATORY_CARE_PROVIDER_SITE_OTHER): Payer: BC Managed Care – PPO | Admitting: Pulmonary Disease

## 2014-06-14 ENCOUNTER — Encounter: Payer: Self-pay | Admitting: Pulmonary Disease

## 2014-06-14 VITALS — BP 132/74 | HR 110 | Ht 62.0 in | Wt 132.0 lb

## 2014-06-14 DIAGNOSIS — J84112 Idiopathic pulmonary fibrosis: Secondary | ICD-10-CM

## 2014-06-14 DIAGNOSIS — R768 Other specified abnormal immunological findings in serum: Secondary | ICD-10-CM | POA: Insufficient documentation

## 2014-06-14 DIAGNOSIS — R894 Abnormal immunological findings in specimens from other organs, systems and tissues: Secondary | ICD-10-CM

## 2014-06-14 DIAGNOSIS — J9611 Chronic respiratory failure with hypoxia: Secondary | ICD-10-CM

## 2014-06-14 DIAGNOSIS — J8410 Pulmonary fibrosis, unspecified: Secondary | ICD-10-CM

## 2014-06-14 NOTE — Assessment & Plan Note (Signed)
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 She says that when she was on the 20 mg dose of prednisone she did feel significantly better but it sounds like this was primarily an energy level issue and not a breathing problem. Her shortness of breath seems to have increased in the last few weeks which is primarily attributable to her weight gain. She has been needing to use more oxygen.  She was initially evaluated for lung transplant by [facility] but was turned down because of the presence of a positive hep C antibody.  Up until this point had been hoping to treat her with immunosuppressants while she waits for lung transplant. Now with the new knowledge of the hepatitis C we will have to have this more thoroughly evaluated by hepatology. It sounds as if she may be a candidate for antiviral therapy for her hepatitis C. During this time I will stop the prednisone.   Plan:  -stop prednisone  -repeat pulmonary function tests now as a point of reference after 3 months of anti-inflammatory therapy with prednisone -Continue to participate in pulmonary rehabilitation -Continue to use oxygen regularly at line-reassess possibility for lung transplant after treatment of hepatitis C.

## 2014-06-14 NOTE — Assessment & Plan Note (Signed)
As I understand it this is a new problem for her, and was detected on an antibody test recently collected. She does not have evidence of cirrhosis. She is currently undergoing evaluation by hepatology for antiviral therapy which I completely agree with. I'm going to stop the prednisone because a don't want to interfere with her treatment. Hopefully she could be considered for lung transplantation after treatment with one of the newer antiviral therapies.  Plan: -Continue follow-up with hepatology -I will request records from her hepatologist

## 2014-06-14 NOTE — Assessment & Plan Note (Signed)
Continue 2 L of oxygen continuously, 3 L with exertion 

## 2014-06-14 NOTE — Progress Notes (Signed)
Subjective:    Patient ID: Erika Martinez, female    DOB: Nov 08, 1955, 59 y.o.   MRN: [id number]  Synopsis: Erika Martinez has connective tissue disease associated UIP and she first saw [facility] in 2015 after a hospitalization for hypoxemic respiratory failure.  She was found to have connective tissue disease associated UIP and was referred to the [facility] center.  She was found to have Hepatitis C in 2015 and her transplant evaluation has been held further as of 06/2014.  2007 CXR > question ILD 08/2013 CXR > clear progression of interstitial infiltrate and volume loss 08/2013 cXR > pneumonia 10/06/2013 CT chest ([place] read) > compatible with UIP, mildly enlarged right paratracheal lymph node, likely old granulomatous disease, mild cardiomegaly 10/06/2013 pulmonary function test [facility]> flow volume loop compatible with obstruction, ratio 77%, FEV1 0.83 L (39% predicted, -4% change with bronchodilator), unable to perform lung volumes, DLCO 9.3 (54% predicted) 12/09/2014 6MW > 990 feet, O2 saturation 80% on 2L > improved with 3L 01/2014 ANA neg, RF positive 44.7, SCL-70 neg, SSA neg, SSB neg, CCP negative, adolase normal, CRP 0.35 (normal) 02/2014 Started prednisone, referred to [facility] 03/01/2014 6MW 900 feet, dropped to 79% on 3L [place] 03/09/2014 Ratio 77%, FEV1 0.70L (33% pred), FVC 0.91L (32% pred), DLCO 5.4 (31% pred)  HPI Chief Complaint  Patient presents with  . Follow-up    pt c/o fatigue with exertion.  has occasional prod cough with white mucus.     Erika Martinez saw the folks at [facility] for her lung transplant team and she was rejected because she was found to have Hepatitis C. She has been taking the prednisone and she said that the 30mg  dose made her have a hard time sleeping, and she had joint stiffness and swelling.  However on the 20mg  dose she has been feeling much better.  She felt a lot more energy and felt like she could "rule the world".  She says that her  O2 saturation has been lower despite taking the prednisone.  She has been eating more and gaining weight on the prednisone.    Past Medical History  Diagnosis Date  . Asthma   . Mitral valve prolapse   . Seasonal allergies   . Hypertension   . Abnormal hemoglobin      Review of Systems  Constitutional: Negative for fever, chills and fatigue.  HENT: Negative for postnasal drip, rhinorrhea and sinus pressure.   Respiratory: Positive for shortness of breath. Negative for cough and wheezing.   Cardiovascular: Negative for chest pain, palpitations and leg swelling.       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 She says that when she was on the 20 mg dose of prednisone she did feel significantly better but it sounds like this was primarily an energy level issue and not a breathing problem.  Her shortness of breath seems to have increased in the last few weeks which is primarily attributable to her weight gain. She has been needing to use more oxygen.  She was initially evaluated for lung transplant by [facility] but was turned down because of the presence of a positive hep C antibody.  Up until this point had been hoping to treat her with immunosuppressants while she waits for lung transplant. Now with the new knowledge of the hepatitis C we will have to have this more thoroughly evaluated by hepatology. It  sounds as if she may be a candidate for antiviral therapy for her hepatitis C. During this time I will stop the prednisone.   Plan:  -stop prednisone  -repeat pulmonary function tests now as a point of reference after 3 months of anti-inflammatory therapy with prednisone -Continue to participate in pulmonary rehabilitation -Continue to use oxygen regularly at line-reassess possibility for lung transplant after treatment of hepatitis C.  Chronic hypoxemic respiratory failure Continue 2 L of oxygen continuously, 3 L with exertion  Hepatitis C antibody test positive As I understand it this is a new problem for her, and was detected on an antibody test recently collected. She does not have evidence of cirrhosis. She is currently undergoing evaluation by hepatology for antiviral therapy which I completely agree with. I'm going to stop the prednisone because a don't want to interfere with her treatment. Hopefully she could be considered for lung transplantation after treatment with one of the newer antiviral therapies.  Plan: -Continue follow-up with hepatology -I will request records from her hepatologist  > 25 minutes spent today in clinic with Erika Martinez  Updated Medication List Outpatient Encounter Prescriptions as of 06/14/2014  Medication Sig  . albuterol (PROVENTIL HFA;VENTOLIN HFA) 108 (90 BASE) MCG/ACT inhaler Inhale 2 puffs into the lungs every 4 (four) hours as needed for wheezing or shortness of breath.  . ALPRAZolam (XANAX) 0.5 MG tablet Take 0.5 mg by mouth 3 (three) times daily as needed for anxiety.  . cholecalciferol (VITAMIN D) 1000 UNITS tablet Take 2,000 Units by mouth daily.  Marland Kitchen diltiazem (CARDIZEM) 60 MG tablet Take 60 mg by mouth daily.   . fluticasone (FLONASE) 50 MCG/ACT nasal spray Place 2 sprays into both nostrils daily as needed.   . Fluticasone-Salmeterol (ADVAIR DISKUS) 250-50 MCG/DOSE AEPB Inhale 1 puff into the lungs 2 (two) times daily.  Marland Kitchen loratadine (LORATADINE  ALLERGY RELIEF) 10 MG dissolvable tablet Take 10 mg by mouth daily.  Marland Kitchen omeprazole (PRILOSEC) 20 MG capsule Take 20 mg by mouth daily.  . predniSONE (DELTASONE) 10 MG tablet Take 1 tablet (10 mg total) by mouth daily with breakfast.  . vitamin B-12 (CYANOCOBALAMIN) 250 MCG tablet Place 250 mcg under the tongue daily.  . [DISCONTINUED] benzonatate (TESSALON) 100 MG capsule Take 1 capsule (100 mg total) by mouth every 8 (eight) hours as needed for cough.

## 2014-06-14 NOTE — Patient Instructions (Signed)
1) Take 5mg  prednisone for a week, then stop; if you need too, take 5mg  every other day for a week to help prevent "crashing" 2) We will arrange the Pulmonary function test and 6 minute walk 3) Go back to the gym 4) Get your Hepatitis C treated 5) Take Delsym as needed for the cough  We will see you back in 4 months or sooner if needed

## 2014-06-26 ENCOUNTER — Ambulatory Visit: Payer: Self-pay | Admitting: Gastroenterology

## 2014-06-29 ENCOUNTER — Other Ambulatory Visit: Payer: Self-pay

## 2014-06-29 MED ORDER — PREDNISONE 10 MG PO TABS: 10.0000 mg | ORAL_TABLET | Freq: Every day | ORAL | Status: DC

## 2014-07-17 ENCOUNTER — Telehealth: Payer: Self-pay | Admitting: Pulmonary Disease

## 2014-07-17 NOTE — Telephone Encounter (Signed)
Called and spoke to pt. Pt is requesting a letter or documentation indicating pt has postinflammatory pulmonary fibrosis. Informed pt that her OV note has all her pulmonary issues on the note. Pt stated she would like the most recent OV note mailed to her. Addressed verified. Pt verbalized understanding and denied any further questions or concerns at this time.

## 2014-07-17 NOTE — Telephone Encounter (Signed)
Pt returned call [phone]

## 2014-07-17 NOTE — Telephone Encounter (Signed)
LMTCB x 1 

## 2014-07-19 ENCOUNTER — Telehealth: Payer: Self-pay | Admitting: Pulmonary Disease

## 2014-07-19 NOTE — Telephone Encounter (Signed)
lmtcb x1.  I placed the note in the pt's chart not to reschedule her due to the fact that today was the 5th appointment we had made for her to do her PFT. She canceled and rescheduled her appointment on 06/26/14, 06/27/14, 07/04/14, 07/12/14 and today 07/19/14. We have limited space on our PFT schedule in [place]. Pt canceled all of her appointments on the day of her appointment instead of 24 hours in advance, which we have placed in our no show policy.

## 2014-07-20 ENCOUNTER — Telehealth: Payer: Self-pay | Admitting: Pulmonary Disease

## 2014-07-20 NOTE — Telephone Encounter (Signed)
Spoke with the pt  She states that she is needing to have last ov note faxed to her work tomorrow  She never received the note we mailed to her  She is unsure of fax number at this time and will call us in the am to provide number  Will await her call

## 2014-07-20 NOTE — Telephone Encounter (Signed)
BQ d/t pt's multiple cancellations, please advise if PFT is indeed still needed.

## 2014-07-21 NOTE — Telephone Encounter (Signed)
OV note has been faxed. Pt is aware. Nothing further was needed.

## 2014-07-21 NOTE — Telephone Encounter (Signed)
Patient cb to give nurse fax number, [phone], please call patient at [phone] when this is done.

## 2014-07-22 NOTE — Telephone Encounter (Signed)
Just don't reschedule any more

## 2014-07-24 NOTE — Telephone Encounter (Signed)
Called and spoke to pt. Informed pt that after this PFT appt she will no longer be able to reschedule. Appt made for 08/07/14 at [facility] office. Pt verbalized understanding and denied any further questions or concerns at this time.

## 2014-07-26 ENCOUNTER — Telehealth: Payer: Self-pay | Admitting: Pulmonary Disease

## 2014-07-26 NOTE — Telephone Encounter (Signed)
Spoke with pt. Advised that since she gave [place] enough notice this time that we could reschedule this one last time. PFT has been moved to 08/08/14 at 4pm. Pt is aware that if she cancels this appointment she will not be rescheduled.

## 2014-08-08 ENCOUNTER — Encounter (INDEPENDENT_AMBULATORY_CARE_PROVIDER_SITE_OTHER): Payer: 59 | Admitting: Pulmonary Disease

## 2014-08-08 DIAGNOSIS — J8410 Pulmonary fibrosis, unspecified: Secondary | ICD-10-CM | POA: Diagnosis not present

## 2014-08-08 LAB — PULMONARY FUNCTION TEST
FEF 25-75 POST: 0.28 L/s
FEF 25-75 PRE: 0.77 L/s
FEF2575-%CHANGE-POST: -63 %
FEF2575-%Pred-Post: 14 %
FEF2575-%Pred-Pre: 40 %
FEV1-%CHANGE-POST: -21 %
FEV1-%PRED-PRE: 50 %
FEV1-%Pred-Post: 39 %
FEV1-PRE: 0.95 L
FEV1-Post: 0.74 L
FEV1FVC-%CHANGE-POST: -19 %
FEV1FVC-%PRED-PRE: 98 %
FEV6-%Change-Post: -2 %
FEV6-%Pred-Post: 50 %
FEV6-%Pred-Pre: 52 %
FEV6-Post: 1.18 L
FEV6-Pre: 1.21 L
FEV6FVC-%PRED-POST: 104 %
FEV6FVC-%Pred-Pre: 104 %
FVC-%CHANGE-POST: -2 %
FVC-%PRED-POST: 48 %
FVC-%Pred-Pre: 50 %
FVC-POST: 1.18 L
FVC-Pre: 1.21 L
POST FEV1/FVC RATIO: 63 %
PRE FEV1/FVC RATIO: 78 %
PRE FEV6/FVC RATIO: 100 %
Post FEV6/FVC ratio: 100 %

## 2014-08-08 NOTE — Progress Notes (Signed)
Attempted to perform PFT today. Patient's anxiety has kept her from performing the test to the best of her ability.

## 2014-08-11 ENCOUNTER — Telehealth: Payer: Self-pay | Admitting: Pulmonary Disease

## 2014-08-11 NOTE — Telephone Encounter (Signed)
Patient says she is suppose to be taking a new medication for Hep C.  Patient was told to take Prednisone until she gets medication for Hep C.  Her job changed insurance without notice, so she e has [place] instead of [place].  She says that because of the change in insurance, the prior authorization for the Hep C medication needs to be started all over again and it will delay her in getting the medication. She wants to know if she needs to take a low dose of prednisone until they get the new Hep C medication approved.  Her next appointment with the Hep C clinic is not until April 1st.  She is not taking anything right now.    B - please advise.

## 2014-08-14 NOTE — Telephone Encounter (Signed)
Yes continue at current dosing

## 2014-08-14 NOTE — Telephone Encounter (Signed)
Spoke with pt, she is aware of recs.  Nothing further needed.  

## 2014-08-16 ENCOUNTER — Telehealth: Payer: Self-pay

## 2014-08-16 NOTE — Telephone Encounter (Signed)
Pt aware of results.  Nothing further needed.  

## 2014-08-16 NOTE — Telephone Encounter (Signed)
-----   Message from Juanito Doom, MD sent at 08/14/2014  1:13 PM EST ----- A, Please let her know that her PFT had not changed since October. Thanks B

## 2014-08-21 ENCOUNTER — Telehealth: Payer: Self-pay | Admitting: Pulmonary Disease

## 2014-08-21 MED ORDER — ALBUTEROL SULFATE HFA 108 (90 BASE) MCG/ACT IN AERS
2.0000 | INHALATION_SPRAY | RESPIRATORY_TRACT | Status: DC | PRN
Start: 2014-08-21 — End: 2014-12-18

## 2014-08-21 NOTE — Telephone Encounter (Signed)
Rx has been sent in for Albuterol HFA. Pt is aware. Nothing further was needed.

## 2014-09-14 ENCOUNTER — Telehealth: Payer: Self-pay | Admitting: Pulmonary Disease

## 2014-09-14 NOTE — Telephone Encounter (Signed)
Called spoke with pt. She reports her allergy doc wants Dr. Lake Bells to increase her advair from 250 mcg to 500 mcg d/t pollen season. Her allergies are acting up which at times makes her feel more SOB. Please advise Dr. Lake Bells thanks

## 2014-09-15 MED ORDER — FLUTICASONE-SALMETEROL 500-50 MCG/DOSE IN AEPB: 1.0000 | INHALATION_SPRAY | Freq: Two times a day (BID) | RESPIRATORY_TRACT | Status: DC

## 2014-09-15 NOTE — Telephone Encounter (Signed)
OK by me 

## 2014-09-15 NOTE — Telephone Encounter (Signed)
Pt is aware that BQ is okay with medication change. Needs rx sent in. This has been done. Nothing further was needed.

## 2014-09-27 ENCOUNTER — Telehealth: Payer: Self-pay | Admitting: Pulmonary Disease

## 2014-09-27 NOTE — Telephone Encounter (Signed)
Spoke with pt. States that she has been coughing a lot and wants to know if she can use her albuterol when this happens. Advised that she could use it but try to limit her use so that she will not become immune to it. She agreed.

## 2014-09-30 NOTE — Consult Note (Signed)
PATIENT NAME:  Erika Martinez, BACHICHA MR#:  [id number] DATE OF BIRTH:  03-Jun-1956  DATE OF CONSULTATION:  09/04/2013  REFERRING PHYSICIAN:   CONSULTING PHYSICIAN:  Dionisio David, MD  INDICATION FOR CONSULTATION: Elevated troponin.   HISTORY OF PRESENT ILLNESS: This is a 59 year old African American female with a past medical history of asthma, hypertension, mitral valve prolapse who presented to the Emergency Room with shortness of breath, cough, diarrhea. She denies any chest pain, PND, orthopnea or leg swelling. She apparently was found to have pneumonia and sepsis. I was asked to evaluate the patient because the patient suddenly started having elevated troponin. On further questioning, she says she never had any cardiac problems, no history of myocardial infarction, just was told that she had mitral valve prolapse in the past and had supraventricular tachycardia and GI reflux along with asthma.   SOCIAL HISTORY: She denies EtOH abuse or smoking.   FAMILY HISTORY: Positive for lung cancer.   ALLERGIES: None.   PHYSICAL EXAMINATION: GENERAL: She is alert and oriented, in mild distress due to shortness of breath.  VITALS: Blood pressure is 137/83, respirations 18, pulse 96, temperature 98.3.  NECK: No JVD.  LUNGS: Clear.  HEART: Regular rate and rhythm. Normal S1, S2. No audible murmur. She is tachycardic.  ABDOMEN: Soft. No tenderness.  EXTREMITIES: No pedal edema.   LABORATORY, DIAGNOSTIC AND RADIOLOGICAL DATA:  EKG shows sinus tachycardia, 109 beats per minute, low voltage, poor R wave progression with LVH, no acute changes. Troponin is 0.65, MB fraction is 5.5 and BNP is 1507. Repeat troponin was 0.58.   ASSESSMENT AND PLAN: Mildly elevated troponin most likely secondary to pneumonia and sepsis. The patient denies any chest pain. There no acute EKG changes. Advise continuation of troponin, another one in another 8 hours and seems like it will go down as she gets better with treatment for  pneumonia and sepsis. We will follow with you.   Thank you very much for the referral.    ____________________________ Dionisio David, MD sak:cs D: 09/04/2013 19:36:46 ET T: 09/04/2013 19:56:03 ET JOB#: [id number]  cc: Dionisio David, MD, <Dictator> Dionisio David MD ELECTRONICALLY SIGNED 10/14/2013 11:01

## 2014-09-30 NOTE — H&P (Signed)
PATIENT NAME:  Erika Martinez, MARCON MR#:  [id number] DATE OF BIRTH:  1956-02-03  DATE OF ADMISSION:  09/03/2013    PRIMARY CARE PHYSICIAN: Steele Sizer, MD  CHIEF COMPLAINT: Weakness.   HISTORY OF PRESENT ILLNESS: A 59 year old African American female with a past medical history of asthma, hypertension, mitral valve prolapse, presenting with weakness and cough. Approximately 1 week ago she had symptoms consisting of nausea and diarrhea without any vomiting. Multiple watery bouts of diarrhea; however, this has resolved for about 3-4 days now and she has associated decreased p.o. intake for that time period. She now is describing a 2-day duration of cough, nonproductive, with associated subjective fevers and chills with weakness and fatigue. She denies any chest pain, palpitations. Positive for occasional shortness of breath, mainly has dyspnea on exertion. Denies any orthopnea, edema. She has no further complaints. In the emergency department she is noted to be hypoxemic on arrival saturating in the high 80s on room air.   REVIEW OF SYSTEMS:  CONSTITUTIONAL: Positive for fevers, chills, fatigue, weakness.  EYES: Denies blurry, double vision, or eye pain.  HEENT: Denies tinnitus, ear pain, hearing loss.  RESPIRATORY: Positive for cough, shortness of breath as described above.  CARDIOVASCULAR: Denies chest pain or palpation.  GASTROINTESTINAL: Positive for nausea, vomiting, diarrhea, abdominal pain.  GENITOURINARY: Denies dysuria or hematuria.  ENDOCRINE: Denies nocturia or thyroid problems.  HEMATOLOGIC AND LYMPHATIC: Denies easy bruising, bleeding. SKIN: Denies rash or lesions. MUSCULOSKELETAL: Denies pain in neck, back, shoulders, knees, or hips.  NEUROLOGIC: Denies paralysis or paresthesias.  PSYCHIATRIC: Denies anxiety or depressive symptoms.   Otherwise, full review of systems performed by me and is negative.   PAST MEDICAL HISTORY: Hypertension, IBS, mitral valve prolapse, SVT,  gastroesophageal reflux disease, asthma.  SOCIAL HISTORY: Positive for occasional alcohol use. Denies any tobacco use or drug usage.   FAMILY HISTORY: Positive for lung cancer.  ALLERGIES: No known drug allergies.   HOME MEDICATIONS: Include Cardizem 60 mg p.o. daily, loratadine 10 mg p.o. daily, Xanax 0.5 mg p.o. q. 6 hours as needed for anxiety, Advair 115/21 mcg inhalation 2 puffs b.i.d., Flonase 50 mcg inhalation nasal spray at nighttime, Prilosec 20 mg p.o. daily, vitamin B12 at 250 mcg p.o. 3 times weekly, vitamin D 50,000 international units once weekly.   PHYSICAL EXAMINATION:  VITAL SIGNS: Temperature 98, heart rate 110, respirations 20, blood pressure 144/72, saturating 97% on 2 liters nasal cannula, saturating 88% on room air. Weight 54.4 kg, BMI 22.  GENERAL: Well-nourished, well-developed, African American apparent no acute distress.  HEAD: Normocephalic, atraumatic.  EYES: Pupils equal, round, reactive to light. Extraocular muscles intact. No scleral icterus.  MOUTH: Moist mucous membranes.  Dentition poor. No abscess noted.  EARS, NOSE, AND THROAT: Clear. No exudates. No external lesions.  NECK: Supple. No thyromegaly. No nodules. No JVD.  PULMONARY: Diffuse course breath sounds scattered throughout right lobe without frank wheezes, rales, or rhonchi. No use of accessory muscles. Good respiratory effort.  CHEST:  Nontender on palpation.  CARDIOVASCULAR: S1, S2, tachycardic and no murmurs, rubs, or gallops. No edema. Pedal pulses 2+ bilaterally.  GASTROINTESTINAL: Soft, nontender, nondistended. No masses. Positive bowel sounds. No hepatosplenomegaly.  MUSCULOSKELETAL: No swelling, clubbing, or edema. Range of motion is full in all extremities. NEUROLOGIC: Cranial nerves II through XII intact. No gross focal neurologic deficits. Sensation intact. Reflexes intact.  SKIN: No ulcerations, lesions, no rashes, or cyanosis. Skin warm and dry. Turgor intact. PSYCHIATRIC: Mood and affect  within normal limits. The patient  alert and oriented x 3. Insight and judgment intact.   LABORATORY DATA: Sodium 136, potassium 3.3, chloride 104, bicarbonate 27, BUN 10, creatinine 1.02, glucose 137. BNP 1507. LFTs: Protein 8.8, albumin 3.3, AST 39, the remainder within normal limits. Troponin I is 0.58, CK 173, WBC 17.5, hemoglobin 15.2, platelets 259,000.   Chest x-ray performed revealing a right upper lobe pneumonia.   ASSESSMENT AND PLAN: A 59 year old African American female with history of asthma presenting with weakness cough.  1. Sepsis: Meeting septic criteria by heart rate and leukocytosis secondary to community-acquired pneumonia. Pan culture including blood and sputum. Antibiotic coverage with Levaquin. Provide supplemental oxygen to keep oxygen saturation greater than 92%. DuoNeb q. 4 hours, incentive spirometry, intravenous fluids, normal saline.  Keep mean arterial pressure greater than 65.  2. Elevated troponin. No cardiac symptoms at this time. We will trend cardiac enzymes, placed on telemetry. 3. Hypokalemia: Replace to goal of 4 to 5. Check magnesium. 4. Gastroesophageal reflux disease, proton pump inhibitor therapy.  5. Venous thromboembolism prophylaxis with heparin subcutaneous.   CODE STATUS: The patient is full code.   TIME SPENT: 45 minutes   ____________________________ Aaron Mose. Hower, MD dkh:lt D: 09/03/2013 20:35:42 ET T: 09/03/2013 21:35:08 ET JOB#: [id number]  cc: Aaron Mose. Hower, MD, <Dictator> DAVID Woodfin Ganja MD ELECTRONICALLY SIGNED 09/04/2013 2:58

## 2014-09-30 NOTE — Discharge Summary (Signed)
PATIENT NAME:  Erika Martinez, Erika Martinez MR#:  [id number] DATE OF BIRTH:  08-07-1955  DATE OF ADMISSION:  09/03/2013 DATE OF DISCHARGE:  09/07/2013  PRIMARY CARE PHYSICIAN:  Dr. Ancil Boozer.  DISCHARGE DIAGNOSES: 1.  Sepsis due to pneumonia. 2.  Acute respiratory failure. 3.  Anxiety. 4.  Elevated troponin possibly due to demand ischemia.  5.  Asthma. 6.  Hypertension.   CONDITION: Stable.  CODE STATUS: Full code.   HOME MEDICATIONS: Please refer to the medication reconciliation list. The patient needs home oxygen 1 to 2 liters by nasal cannula.   DIET: Low-sodium diet.   ACTIVITY: As tolerated.  FOLLOWUP CARE:  Follow up with PCP within 1 to 2 weeks. The patient has a pulmonary physician appointment scheduled.  REASON FOR ADMISSION: Weakness.   HOSPITAL COURSE: The patient is a 59 year old African American female with a history of asthma, hypertension, mitral valve prolapse, presented to the ED with weakness and a cough for 1 week, associated with subjective fever and chills. In the ED, the patient was hypoxic with O2 sat at 80s. For detailed history and physical examination, please refer to the admission note dictated by Dr. Lavetta Nielsen.  Laboratory data on admission did show WBC 17.5, hemoglobin 15.2, BUN 10, creatinine 1.02. Chest x-ray showed right upper lobe pneumonia.  1.  Sepsis due to community-acquired pneumonia. After admission, the patient has been treated with Levaquin IV with oxygen by nasal cannula and nebulizer, incentive spirometry. The patient's symptoms have much improved.   2.  Acute respiratory failure. However, the patient's oxygen saturation decreased to 70s on exertion without oxygen. We tried several times, the patient still has low oxygen saturation without oxygen. The patient needs home oxygen 1 to 2 liters.   3.  Elevated troponin, possibly due to demand ischemia. The patient's troponin level was 0.58, 0.65, 0.47. Dr. Humphrey Rolls evaluated the patient, suggested the patient had  demand ischemia due to sepsis and pneumonia. He suggested continue aspirin.  4.  History of asthma. The patient had no wheezing. Asthma has been stable.  5.  Hypertension, controlled.  6.  The patient is clinically stable and will be discharged to home with home oxygen today.  I discussed the patient's discharge plan with patient, nurse and case manager.   TIME SPENT: About 39 minutes.   ____________________________ Demetrios Loll, MD qc:ce D: 09/07/2013 15:45:08 ET T: 09/07/2013 19:33:11 ET JOB#: [id number]  cc: Demetrios Loll, MD, <Dictator> Demetrios Loll MD ELECTRONICALLY SIGNED 09/08/2013 18:04

## 2014-10-19 ENCOUNTER — Encounter: Payer: Self-pay | Admitting: Pulmonary Disease

## 2014-10-19 ENCOUNTER — Ambulatory Visit (INDEPENDENT_AMBULATORY_CARE_PROVIDER_SITE_OTHER): Payer: 59 | Admitting: Pulmonary Disease

## 2014-10-19 VITALS — BP 140/78 | HR 57 | Ht 62.0 in | Wt 127.0 lb

## 2014-10-19 DIAGNOSIS — J9611 Chronic respiratory failure with hypoxia: Secondary | ICD-10-CM

## 2014-10-19 DIAGNOSIS — R768 Other specified abnormal immunological findings in serum: Secondary | ICD-10-CM

## 2014-10-19 DIAGNOSIS — J84112 Idiopathic pulmonary fibrosis: Secondary | ICD-10-CM

## 2014-10-19 DIAGNOSIS — R894 Abnormal immunological findings in specimens from other organs, systems and tissues: Secondary | ICD-10-CM | POA: Diagnosis not present

## 2014-10-19 NOTE — Assessment & Plan Note (Signed)
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 She has been steroid responsive in the past. However, she has had progression in her oxygen needs over the last year and her lung function testing has declined. We had to stop the prednisone so that she could be treated for hepatitis C as I believe she needs to continue on a workup for a lung transplant. Right now she seems to be stable without significant worsening of her symptoms, but I would like to get her back on prednisone as soon as possible.  Plan Today we spent a significant amount of time discussing the fact that I 1 her to stay as active as possible and I'm going to have her enroll in pulmonary rehabilitation again to stay fit so that she can be ready for transplant if her lung function suddenly declined. Apart from that the plan will be for her to resume prednisone after she completes the hepatitis C treatment, we will likely add a second agent such as Imuran at that time.

## 2014-10-19 NOTE — Assessment & Plan Note (Signed)
Continue 2 L at rest and 3 L with exertion

## 2014-10-19 NOTE — Assessment & Plan Note (Signed)
She is currently undergoing treatment for hepatitis C. She is tolerating the medication well and apparently her viral loads are low. I am optimistic that after she completes treatment we should be able to successfully fully restart prednisone.

## 2014-10-19 NOTE — Progress Notes (Signed)
Subjective:    Patient ID: Erika Martinez, female    DOB: August 12, 1955, 59 y.o.   MRN: [id number]  Synopsis: Erika Martinez has connective tissue disease associated UIP and she first saw [place] [facility] in 2015 after a hospitalization for hypoxemic respiratory failure.  She was found to have connective tissue disease associated UIP and was referred to the [facility] center.  She was found to have Hepatitis C in 2015 and her transplant evaluation has been held further as of 06/2014.  2007 CXR > question ILD 08/2013 CXR > clear progression of interstitial infiltrate and volume loss 08/2013 cXR > pneumonia 10/06/2013 CT chest ([place] read) > compatible with UIP, mildly enlarged right paratracheal lymph node, likely old granulomatous disease, mild cardiomegaly 10/06/2013 pulmonary function test [facility]> flow volume loop compatible with obstruction, ratio 77%, FEV1 0.83 L (39% predicted, -4% change with bronchodilator), unable to perform lung volumes, DLCO 9.3 (54% predicted) 12/09/2014 6MW > 990 feet, O2 saturation 80% on 2L > improved with 3L 01/2014 ANA neg, RF positive 44.7, SCL-70 neg, SSA neg, SSB neg, CCP negative, adolase normal, CRP 0.35 (normal) 02/2014 Started prednisone, referred to [place] [facility] 03/01/2014 6MW 900 feet, dropped to 79% on 3L [place] 03/09/2014 Ratio 77%, FEV1 0.70L (33% pred), FVC 0.91L (32% pred), DLCO 5.4 (31% pred) 08/08/2014 PFT> Ratio 63% post (but 78% pre), FEV1 0.74L (39% pred), FVC 1.18L (48% pred),   HPI Chief Complaint  Patient presents with  . Follow-up    pt c/o sob with exertion uphill, states hot/humid weather makes it worse.     Erika Martinez has been doing OK. She has been taking hepatitis C medication for the last several weeks.  She didn't get approved until recently because her insurance changed.  She has to take another month worth of the medications.  Apparently they are going to test her viral load next week and again in one month.    Since coming  off of the prednisone she has felt a bit more difficulty breathing.  She has checked into disability because of the breathing difficulty.  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  She says that her weight has gone up in the last few weeks.  She has been continuing to exercise regularly but she is using more oxygen lately (up to 3 with regularly activity, 4 with heavy exertion at the gym).  She is not coughing much.    Past Medical History  Diagnosis Date  . Asthma   . Mitral valve prolapse   . Seasonal allergies   . Hypertension   . Abnormal hemoglobin      Review of Systems  Constitutional: Negative for fever, chills and fatigue.  HENT: Negative for postnasal drip, rhinorrhea and sinus pressure.   Respiratory: Positive for shortness of breath. Negative for cough and wheezing.   Cardiovascular: Negative for chest pain, palpitations and leg swelling.       Objective:   Physical Exam  Filed Vitals:   10/19/14 1537  BP: 140/78  Pulse: 57  Height: 5\' 2"  (1.575 m)  Weight: 127 lb (57.607 kg)  SpO2: 98%  2L    Gen: well appearing, no acute distress HEENT: NCAT, EOMi, OP clear PULM: Crackles in bases bilaterally, CTA otherwise CV: RRR, no mgr, no JVD AB: BS+, soft, nontender, no hsm Ext: warm, no edema, + clubbing, no cyanosis Derm: no rash or  skin breakdown  Records from GI clinic reviewed, she is being treated with Harvoni for the hepatitis C Hepatitis C level 410 IU/ML, 2.3 log10      Assessment & Plan:   Chronic hypoxemic respiratory failure Continue 2 L at rest and 3 L with exertion   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 She has been steroid responsive in the past. However, she has had progression in her oxygen needs over the last year and her lung function testing has declined. We had to stop the prednisone so that she could be  treated for hepatitis C as I believe she needs to continue on a workup for a lung transplant. Right now she seems to be stable without significant worsening of her symptoms, but I would like to get her back on prednisone as soon as possible.  Plan Today we spent a significant amount of time discussing the fact that I 1 her to stay as active as possible and I'm going to have her enroll in pulmonary rehabilitation again to stay fit so that she can be ready for transplant if her lung function suddenly declined. Apart from that the plan will be for her to resume prednisone after she completes the hepatitis C treatment, we will likely add a second agent such as Imuran at that time.   Hepatitis C antibody test positive She is currently undergoing treatment for hepatitis C. She is tolerating the medication well and apparently her viral loads are low. I am optimistic that after she completes treatment we should be able to successfully fully restart prednisone.   > 25 minutes spent today in clinic with [place]  Updated Medication List Outpatient Encounter Prescriptions as of 10/19/2014  Medication Sig  . albuterol (PROVENTIL HFA;VENTOLIN HFA) 108 (90 BASE) MCG/ACT inhaler Inhale 2 puffs into the lungs every 4 (four) hours as needed for wheezing or shortness of breath.  . ALPRAZolam (XANAX) 0.5 MG tablet Take 0.5 mg by mouth 3 (three) times daily as needed for anxiety.  . Azelastine-Fluticasone 137-50 MCG/ACT SUSP Place 2 puffs into the nose daily.  . cetirizine (ZYRTEC) 10 MG tablet Take 10 mg by mouth daily.  . cholecalciferol (VITAMIN D) 1000 UNITS tablet Take 2,000 Units by mouth daily.  Marland Kitchen diltiazem (CARDIZEM) 60 MG tablet Take 60 mg by mouth daily.   . Fluticasone-Salmeterol (ADVAIR DISKUS) 500-50 MCG/DOSE AEPB Inhale 1 puff into the lungs 2 (two) times daily.  . Ledipasvir-Sofosbuvir 90-400 MG TABS Take 1 tablet by mouth daily.  Marland Kitchen omeprazole (PRILOSEC) 20 MG capsule Take 20 mg by mouth daily.  .  vitamin B-12 (CYANOCOBALAMIN) 250 MCG tablet Place 250 mcg under the tongue daily.  . [DISCONTINUED] predniSONE (DELTASONE) 10 MG tablet Take 1 tablet (10 mg total) by mouth daily with breakfast.   No facility-administered encounter medications on file as of 10/19/2014.

## 2014-10-19 NOTE — Patient Instructions (Signed)
Continue taking the hepatitis C medication I'm going to see you in 6 weeks after you have completed the medication so that we can start you back on prednisone I will make a phone call to the [place] lung transplant program to let them know which are statuses We will see you in 6 weeks

## 2014-12-05 ENCOUNTER — Encounter: Payer: 59 | Attending: Pulmonary Disease | Admitting: Respiratory Therapy

## 2014-12-05 ENCOUNTER — Encounter: Payer: Self-pay | Admitting: Internal Medicine

## 2014-12-05 ENCOUNTER — Encounter: Payer: Self-pay | Admitting: Respiratory Therapy

## 2014-12-05 DIAGNOSIS — J8410 Pulmonary fibrosis, unspecified: Secondary | ICD-10-CM

## 2014-12-05 NOTE — Patient Instructions (Signed)
Patient Instructions  Patient Details  Name: Erika Martinez MRN: [id number] Date of Birth: [birth year] Referring Provider:  Juanito Doom, MD  Below are the personal goals you chose as well as exercise and nutrition goals. Our goal is to help you keep on track towards obtaining and maintaining your goals. We will be discussing your progress on these goals with you throughout the program.  Initial Exercise Prescription:     Initial Exercise Prescription - 12/05/14 1300    Date of Initial Exercise Prescription   Date 12/05/14   Treadmill   MPH 1.5   Grade 0   Minutes 10   Recumbant Bike   Level 1   RPM 40   Watts 25   Minutes 10   NuStep   Level 2   Watts 40   Minutes 10   Arm Ergometer   Level 1   Watts 10   Minutes 10   REL-XR   Level 2   Watts 40   Minutes 10   Prescription Details   Frequency (times per week) 3   Duration Progress to 30 minutes of continuous aerobic without signs/symptoms of physical distress   Intensity   THRR REST +  30   Ratings of Perceived Exertion 11-15   Perceived Dyspnea 2-4   Progression Continue progressive overload as per policy without signs/symptoms or physical distress.   Resistance Training   Training Prescription Yes   Weight 2   Reps 10-15      Exercise Goals: Frequency: Be able to perform aerobic exercise three times per week working toward 3-5 days per week.  Intensity: Work with a perceived exertion of 11 (fairly light) - 15 (hard) as tolerated. Follow your new exercise prescription and watch for changes in prescription as you progress with the program. Changes will be reviewed with you when they are made.  Duration: You should be able to do 30 minutes of continuous aerobic exercise in addition to a 5 minute warm-up and a 5 minute cool-down routine.  Nutrition Goals: Your personal nutrition goals will be established when you do your nutrition analysis with the dietician.  The following are nutrition guidelines to  follow: Cholesterol < 200mg /day Sodium < 1500mg /day Fiber: Women over 50 yrs - 21 grams per day  Personal Goals:     Personal Goals and Risk Factors at Admission - 12/05/14 1039    Personal Goals and Risk Factors on Admission    Weight Management No   Increase Aerobic Exercise and Physical Activity Yes   Intervention While in program, learn and follow the exercise prescription taught. Start at a low level workload and increase workload after able to maintain previous level for 30 minutes. Increase time before increasing intensity.  [name] was a member of [employer] and plans to return there after LW.   Quit Smoking No   Take Less Medication No   Understand more about Heart/Pulmonary Disease. Yes   Intervention While in program utilize professionals for any questions, and attend the education sessions. Great websites to use are www.americanheart.org or www.lung.org for reliable information.  [name] is very motivated to learn new information on medical issues. She is planning to start a Pulmonary Support Group.   Improve shortness of breath with ADL's Yes   Intervention While in program, learn and follow the exercise prescription taught. Start at a low level workload and increase workload ad advised by the exercise physiologist. Increase time before increasing intensity.  Her UCSD SOB Questionnaire  was scored 15.  She knows to pace herself with activities.   Develop more efficient breathing techniques such as purse lipped breathing and diaphragmatic breathing; and practicing self-pacing with activity Yes   Intervention While in program, learn and utilize the specific breathing techniques taught to you. Continue to practice and use the techniques as needed.  [name] does perform PLB, but does forget to do it at times.   Increase knowledge of respiratory medications and ability to use respiratory devices properly.  Yes   Intervention While in program learn and demonstrate  appropriate use of your oxygen therapy by increasing flow with exertion, manage oxygen tank operation, including continuous and intermittent flow.  Understanding oxygen is a drug ordered by your physician.;While in program, learn to administer MDI, nebulizer, and spacer properly.;Learn to take respiratory medicine as ordered.;While in program, learn to Clean MDI, nebulizers, and spacers properly.  She has a good understanding of her Advair, Proventil, and spacer. She uses 2-4l/m of oxygen - adjusting the flows as needed; she has 2 portable tanks - one a concentrator and the other  [place].      Tobacco Use Initial Evaluation: History  Smoking status  . Never Smoker   Smokeless tobacco  . Never Used    Copy of goals given to participant.

## 2014-12-05 NOTE — Patient Instructions (Signed)
pul Patient Instructions  Patient Details  Name: JANAI MAUDLIN MRN: [id number] Date of Birth: 08-14-1955 Referring Provider:  Dr. Simonne Maffucci  Below are the personal goals you chose as well as exercise and nutrition goals. Our goal is to help you keep on track towards obtaining and maintaining your goals. We will be discussing your progress on these goals with you throughout the program.  Initial Exercise Prescription:     Initial Exercise Prescription - 12/05/14 1300    Date of Initial Exercise Prescription   Date 12/05/14   Treadmill   MPH 1.5   Grade 0   Minutes 10   Recumbant Bike   Level 1   RPM 40   Watts 25   Minutes 10   NuStep   Level 2   Watts 40   Minutes 10   Arm Ergometer   Level 1   Watts 10   Minutes 10   REL-XR   Level 2   Watts 40   Minutes 10   Prescription Details   Frequency (times per week) 3   Duration Progress to 30 minutes of continuous aerobic without signs/symptoms of physical distress   Intensity   THRR REST +  30   Ratings of Perceived Exertion 11-15   Perceived Dyspnea 2-4   Progression Continue progressive overload as per policy without signs/symptoms or physical distress.   Resistance Training   Training Prescription Yes   Weight 2   Reps 10-15      Exercise Goals: Frequency: Be able to perform aerobic exercise three times per week working toward 3-5 days per week.  Intensity: Work with a perceived exertion of 11 (fairly light) - 15 (hard) as tolerated. Follow your new exercise prescription and watch for changes in prescription as you progress with the program. Changes will be reviewed with you when they are made.  Duration: You should be able to do 30 minutes of continuous aerobic exercise in addition to a 5 minute warm-up and a 5 minute cool-down routine.  Nutrition Goals: Your personal nutrition goals will be established when you do your nutrition analysis with the dietician.  The following are nutrition guidelines  to follow: Cholesterol < 200mg /day Sodium < 1500mg /day Fiber: Women over 50 yrs - 21 grams per day  Personal Goals:     Personal Goals and Risk Factors at Admission - 12/05/14 1039    Personal Goals and Risk Factors on Admission    Weight Management No   Increase Aerobic Exercise and Physical Activity Yes   Intervention While in program, learn and follow the exercise prescription taught. Start at a low level workload and increase workload after able to maintain previous level for 30 minutes. Increase time before increasing intensity.  Ms Felicetti was a member of [employer] and plans to return there after LW.   Quit Smoking No   Take Less Medication No   Understand more about Heart/Pulmonary Disease. Yes   Intervention While in program utilize professionals for any questions, and attend the education sessions. Great websites to use are www.americanheart.org or www.lung.org for reliable information.  Ms Mcsorley is very motivated to learn new information on medical issues. She is planning to start a Pulmonary Support Group.   Improve shortness of breath with ADL's Yes   Intervention While in program, learn and follow the exercise prescription taught. Start at a low level workload and increase workload ad advised by the exercise physiologist. Increase time before increasing intensity.  Her UCSD SOB Questionnaire  was scored 15.  She knows to pace herself with activities.   Develop more efficient breathing techniques such as purse lipped breathing and diaphragmatic breathing; and practicing self-pacing with activity Yes   Intervention While in program, learn and utilize the specific breathing techniques taught to you. Continue to practice and use the techniques as needed.  Ms Lupi does perform PLB, but does forget to do it at times.   Increase knowledge of respiratory medications and ability to use respiratory devices properly.  Yes   Intervention While in program learn and demonstrate  appropriate use of your oxygen therapy by increasing flow with exertion, manage oxygen tank operation, including continuous and intermittent flow.  Understanding oxygen is a drug ordered by your physician.;While in program, learn to administer MDI, nebulizer, and spacer properly.;Learn to take respiratory medicine as ordered.;While in program, learn to Clean MDI, nebulizers, and spacers properly.  She has a good understanding of her Advair, Proventil, and spacer. She uses 2-4l/m of oxygen - adjusting the flows as needed; she has 2 portable tanks - one a concentrator and the other  [place].      Tobacco Use Initial Evaluation: History  Smoking status   Never Smoker   Smokeless tobacco   Never Used    Copy of goals given to participant.

## 2014-12-05 NOTE — Progress Notes (Signed)
Pulmonary Individual Treatment Plan  Patient Details  Name: Erika Martinez MRN: [id number] Date of Birth: [birth year] Referring Provider:  Juanito Doom, MD  Initial Encounter Date: 12/05/14  Visit Diagnosis: Pulmonary interstitial fibrosis  Patient's Home Medications on Admission:  Current outpatient prescriptions:    albuterol (PROVENTIL HFA;VENTOLIN HFA) 108 (90 BASE) MCG/ACT inhaler, Inhale 2 puffs into the lungs every 4 (four) hours as needed for wheezing or shortness of breath., Disp: 8 g, Rfl: 2   ALPRAZolam (XANAX) 0.5 MG tablet, Take 0.5 mg by mouth 3 (three) times daily as needed for anxiety., Disp: , Rfl:    Azelastine-Fluticasone 137-50 MCG/ACT SUSP, Place 2 puffs into the nose daily., Disp: , Rfl:    cetirizine (ZYRTEC) 10 MG tablet, Take 10 mg by mouth daily., Disp: , Rfl:    cholecalciferol (VITAMIN D) 1000 UNITS tablet, Take 2,000 Units by mouth daily., Disp: , Rfl:    diltiazem (CARDIZEM) 60 MG tablet, Take 60 mg by mouth daily. , Disp: , Rfl:    Fluticasone-Salmeterol (ADVAIR DISKUS) 500-50 MCG/DOSE AEPB, Inhale 1 puff into the lungs 2 (two) times daily., Disp: 60 each, Rfl: 2   Ledipasvir-Sofosbuvir 90-400 MG TABS, Take 1 tablet by mouth daily., Disp: , Rfl:    omeprazole (PRILOSEC) 20 MG capsule, Take 20 mg by mouth daily., Disp: , Rfl:    vitamin B-12 (CYANOCOBALAMIN) 250 MCG tablet, Place 250 mcg under the tongue daily., Disp: , Rfl:   Past Medical History: Past Medical History  Diagnosis Date   Asthma    Mitral valve prolapse    Seasonal allergies    Hypertension    Abnormal hemoglobin     Tobacco Use: History  Smoking status   Never Smoker   Smokeless tobacco   Never Used    Labs: Recent Review Flowsheet Data    There is no flowsheet data to display.       ADL [facility]:     ADL [facility]      12/05/14 0920       ADL [facility]   ADL Phase Entry     SOB Score total 15     Rest 0     Walk 0     Stairs 1     Bath 0     Dress 0      Shop 1         Pulmonary Function Assessment:     Pulmonary Function Assessment - 12/05/14 0920    Pulmonary Function Tests   FVC% 48 %   FEV1% 50 %   FEV1/FVC Ratio 63   DLCO% 54 %   Breath   Bilateral Breath Sounds Basilar;Rales   Shortness of Breath Yes      Exercise Target Goals:    Exercise Program Goal: Individual exercise prescription set with THRR, safety & activity barriers. Participant demonstrates ability to understand and report RPE using BORG scale, to self-measure pulse accurately, and to acknowledge the importance of the exercise prescription.  Exercise Prescription Goal: Starting with aerobic activity 30 plus minutes a day, 3 days per week for initial exercise prescription. Provide home exercise prescription and guidelines that participant acknowledges understanding prior to discharge.  Activity Barriers & Risk Stratification:     Activity Barriers & Risk Stratification - 12/05/14 0920    Activity Barriers & Risk Stratification   Activity Barriers None      6 Minute Walk:   Initial Exercise Prescription:   Exercise Prescription Changes:   Discharge Exercise Prescription (  Final Exercise Prescription Changes):    Nutrition:  Target Goals: Understanding of nutrition guidelines, daily intake of sodium 1500mg , cholesterol 200mg , calories 30% from fat and 7% or less from saturated fats, daily to have 5 or more servings of fruits and vegetables.  Biometrics:    Nutrition Therapy Plan and Nutrition Goals:     Nutrition Therapy & Goals - 12/05/14 0920    Nutrition Therapy   Diet Ms Zawistowski prefers not to meet with the dietitian; she manages her diet well with small portion sizes, limits sweets; drinks 6 glasses of water/day and 2 ginger ale soda/day; no caffeine.      Nutrition Discharge: Rate Your Plate Scores:   Psychosocial: Target Goals: Acknowledge presence or absence of depression, maximize coping skills, provide positive  support system. Participant is able to verbalize types and ability to use techniques and skills needed for reducing stress and depression.  Initial Review & Psychosocial Screening:   Quality of Life Scores:     Quality of Life - 12/05/14 1052    Quality of Life Scores   Health/Function Pre 17.22 %   Socioeconomic Pre 20.71 %   Psych/Spiritual Pre 24 %   Family Pre 22.8 %   GLOBAL Pre 20.07 %      PHQ-9:     Recent Review Flowsheet Data    Depression screen PHQ 2/9 12/05/2014   Decreased Interest 1   Down, Depressed, Hopeless 0   PHQ - 2 Score 1      Psychosocial Evaluation and Intervention:     Psychosocial Evaluation - 12/05/14 0920    Psychosocial Evaluation & Interventions   Interventions Stress management education;Relaxation education;Therapist referral;Encouraged to exercise with the program and follow exercise prescription   Comments Ms Kist states at times she is depressed. Recently she missed an annual trip to [place] do to her illness.  This was so disappointing, but she does manage her illness well and even plans to set-up a Pulmonary Fibrosis Support Group.   Continued Psychosocial Services Needed No      Psychosocial Re-Evaluation:  Education: Education Goals: Education classes will be provided on a weekly basis, covering required topics. Participant will state understanding/return demonstration of topics presented.  Learning Barriers/Preferences:     Learning Barriers/Preferences - 12/05/14 0920    Learning Barriers/Preferences   Learning Barriers None   Learning Preferences Group Instruction;Individual Instruction;Pictoral;Skilled Demonstration;Verbal Instruction;Video;Written Material      Education Topics: Initial Evaluation Education: - Verbal, written and demonstration of respiratory meds, RPE/PD scales, oximetry and breathing techniques. Instruction on use of nebulizers and MDIs: cleaning and proper use, rinsing mouth with steroid doses  and importance of monitoring MDI activations.          Most Recent Value   Date  12/05/14   Educator  LB   Instruction Review Code  2- meets goals/outcomes      General Nutrition Guidelines/Fats and Fiber: -Group instruction provided by verbal, written material, models and posters to present the general guidelines for heart healthy nutrition. Gives an explanation and review of dietary fats and fiber.   Controlling Sodium/Reading Food Labels: -Group verbal and written material supporting the discussion of sodium use in heart healthy nutrition. Review and explanation with models, verbal and written materials for utilization of the food label.   Exercise Physiology & Risk Factors: - Group verbal and written instruction with models to review the exercise physiology of the cardiovascular system and associated critical values. Details cardiovascular disease risk factors and the goals  associated with each risk factor.   Aerobic Exercise & Resistance Training: - Gives group verbal and written discussion on the health impact of inactivity. On the components of aerobic and resistive training programs and the benefits of this training and how to safely progress through these programs.   Flexibility, Balance, General Exercise Guidelines: - Provides group verbal and written instruction on the benefits of flexibility and balance training programs. Provides general exercise guidelines with specific guidelines to those with heart or lung disease. Demonstration and skill practice provided.   Stress Management: - Provides group verbal and written instruction about the health risks of elevated stress, cause of high stress, and healthy ways to reduce stress.   Depression: - Provides group verbal and written instruction on the correlation between heart/lung disease and depressed mood, treatment options, and the stigmas associated with seeking treatment.   Exercise & Equipment Safety: - Individual  verbal instruction and demonstration of equipment use and safety with use of the equipment.   Infection Prevention: - Provides verbal and written material to individual with discussion of infection control including proper hand washing and proper equipment cleaning during exercise session.   Falls Prevention: - Provides verbal and written material to individual with discussion of falls prevention and safety.      Most Recent Value   Date  12/05/14   Educator  LB   Instruction Review Code  2- meets goals/outcomes      Diabetes: - Individual verbal and written instruction to review signs/symptoms of diabetes, desired ranges of glucose level fasting, after meals and with exercise. Advice that pre and post exercise glucose checks will be done for 3 sessions at entry of program.   Chronic Lung Diseases: - Group verbal and written instruction to review new updates, new respiratory medications, new advancements in procedures and treatments. Provide informative websites and "800" numbers of self-education.   Lung Procedures: - Group verbal and written instruction to describe testing methods done to diagnose lung disease. Review the outcome of test results. Describe the treatment choices: Pulmonary Function Tests, ABGs and oximetry.   Energy Conservation: - Provide group verbal and written instruction for methods to conserve energy, plan and organize activities. Instruct on pacing techniques, use of adaptive equipment and posture/positioning to relieve shortness of breath.   Triggers: - Group verbal and written instruction to review types of environmental controls: home humidity, furnaces, filters, dust mite/pet prevention, HEPA vacuums. To discuss weather changes, air quality and the benefits of nasal washing.   Exacerbations: - Group verbal and written instruction to provide: warning signs, infection symptoms, calling MD promptly, preventive modes, and value of vaccinations. Review:  effective airway clearance, coughing and/or vibration techniques. Create an Sports administrator.   Oxygen: - Individual and group verbal and written instruction on oxygen therapy. Includes supplement oxygen, available portable oxygen systems, continuous and intermittent flow rates, oxygen safety, concentrators, and Medicare reimbursement for oxygen.      Most Recent Value   Date  12/05/14   Educator  LB   Instruction Review Code  2- meets goals/outcomes      Respiratory Medications: - Group verbal and written instruction to review medications for lung disease. Drug class, frequency, complications, importance of spacers, rinsing mouth after steroid MDI's, and proper cleaning methods for nebulizers.      Most Recent Value   Date  12/05/14   Educator  LB   Instruction Review Code  2- meets goals/outcomes      AED/CPR: - Group verbal and written  instruction with the use of models to demonstrate the basic use of the AED with the basic ABC's of resuscitation.   Breathing Retraining: - Provides individuals verbal and written instruction on purpose, frequency, and proper technique of diaphragmatic breathing and pursed-lipped breathing. Applies individual practice skills.      Most Recent Value   Date  12/05/14   Educator  LB   Instruction Review Code  2- meets goals/outcomes      Anatomy and Physiology of the Lungs: - Group verbal and written instruction with the use of models to provide basic lung anatomy and physiology related to function, structure and complications of lung disease.   Heart Failure: - Group verbal and written instruction on the basics of heart failure: signs/symptoms, treatments, explanation of ejection fraction, enlarged heart and cardiomyopathy.   Sleep Apnea: - Individual verbal and written instruction to review Obstructive Sleep Apnea. Review of risk factors, methods for diagnosing and types of masks and machines for OSA.   Anxiety: - Provides group, verbal and  written instruction on the correlation between heart/lung disease and anxiety, treatment options, and management of anxiety.   Relaxation: - Provides group, verbal and written instruction about the benefits of relaxation for patients with heart/lung disease. Also provides patients with examples of relaxation techniques.   Knowledge Questionnaire Score:     Knowledge Questionnaire Score - 12/05/14 0920    Knowledge Questionnaire Score   Pre Score -2      Personal Goals and Risk Factors at Admission:     Personal Goals and Risk Factors at Admission - 12/05/14 1039    Personal Goals and Risk Factors on Admission    Weight Management No   Increase Aerobic Exercise and Physical Activity Yes   Intervention While in program, learn and follow the exercise prescription taught. Start at a low level workload and increase workload after able to maintain previous level for 30 minutes. Increase time before increasing intensity.  Ms Celestin was a member of [facility] and plans to return there after LW.   Quit Smoking No   Take Less Medication No   Understand more about Heart/Pulmonary Disease. Yes   Intervention While in program utilize professionals for any questions, and attend the education sessions. Great websites to use are www.americanheart.org or www.lung.org for reliable information.  Ms Mclester is very motivated to learn new information on medical issues. She is planning to start a Pulmonary Support Group.   Improve shortness of breath with ADL's Yes   Intervention While in program, learn and follow the exercise prescription taught. Start at a low level workload and increase workload ad advised by the exercise physiologist. Increase time before increasing intensity.  Her [facility] SOB Questionnaire was scored 15.  She knows to pace herself with activities.   Develop more efficient breathing techniques such as purse lipped breathing and diaphragmatic breathing; and practicing self-pacing  with activity Yes   Intervention While in program, learn and utilize the specific breathing techniques taught to you. Continue to practice and use the techniques as needed.  Ms Mable does perform PLB, but does forget to do it at times.   Increase knowledge of respiratory medications and ability to use respiratory devices properly.  Yes   Intervention While in program learn and demonstrate appropriate use of your oxygen therapy by increasing flow with exertion, manage oxygen tank operation, including continuous and intermittent flow.  Understanding oxygen is a drug ordered by your physician.;While in program, learn to administer MDI, nebulizer, and  spacer properly.;Learn to take respiratory medicine as ordered.;While in program, learn to Clean MDI, nebulizers, and spacers properly.  She has a good understanding of her Advair, Proventil, and spacer. She uses 2-4l/m of oxygen - adjusting the flows as needed; she has 2 portable tanks - one a concentrator and the other  Clute.      Personal Goals and Risk Factors Review:    Personal Goals Discharge:    Comments: Initial Evaluation Day; Ms Apuzzo will start 12/08/14 and attend 3 days/wk.

## 2014-12-05 NOTE — Addendum Note (Signed)
Addended by: Joretta Bachelor on: 12/05/2014 02:19 PM   Modules accepted: Orders

## 2014-12-06 ENCOUNTER — Encounter: Payer: Self-pay | Admitting: Internal Medicine

## 2014-12-08 ENCOUNTER — Encounter: Payer: 59 | Attending: Pulmonary Disease | Admitting: Respiratory Therapy

## 2014-12-08 DIAGNOSIS — J189 Pneumonia, unspecified organism: Secondary | ICD-10-CM

## 2014-12-08 DIAGNOSIS — J8410 Pulmonary fibrosis, unspecified: Secondary | ICD-10-CM | POA: Diagnosis not present

## 2014-12-08 NOTE — Progress Notes (Signed)
Daily Session Note  Patient Details  Name: Erika Martinez MRN: [id number] Date of Birth: Feb 16, 1956 Referring Provider:  Juanito Doom, MD  Encounter Date: 12/08/2014  Check In:     Session Check In - 12/08/14 1146    Check-In   Staff Present Frederich Cha RRT, RCP Respiratory Therapist;Carroll Enterkin RN, BSN;Susanne Bice RN, BSN, CCRP;Renee Dillard [place] MS, ACSM CEP Exercise Physiologist   ER physicians immediately available to respond to emergencies [name] immediately available ER MD   Physician(s) Dr. Thomasene Lot and Dr. Clearnce Hasten   Medication changes reported     No   Fall or balance concerns reported    No   Warm-up and Cool-down Performed on first and last piece of equipment   Pain Assessment   Currently in Pain? No/denies   Multiple Pain Sites No         Goals Met:  Proper associated with RPD/PD & O2 Sat Exercise tolerated well Personal goals reviewed Queuing for purse lip breathing Strength training completed today  Goals Unmet:  Not Applicable  Goals Comments: First day of exercise   Dr. Emily Filbert is Medical Director for [facility] and [name].

## 2014-12-12 ENCOUNTER — Telehealth: Payer: Self-pay | Admitting: Pulmonary Disease

## 2014-12-12 ENCOUNTER — Telehealth: Payer: Self-pay | Admitting: Family Medicine

## 2014-12-12 NOTE — Telephone Encounter (Signed)
Spoke with pt and informed her per BQs notes that she is to f/u in 6 weeks after finishing her medication. Pt was scheduled for her f/u. Nothing further needed.

## 2014-12-12 NOTE — Telephone Encounter (Signed)
She should not go on prednisone to have more energy, only if she has a flare of pulmonary fibrosis./COPD

## 2014-12-12 NOTE — Telephone Encounter (Signed)
Patient states she needs a call back.

## 2014-12-12 NOTE — Telephone Encounter (Signed)
Patient is moving out of her apartment and has to be out by the end of the month, and wanted to know if she should be back on Prednisone due to her having to do so much. Called the pulmologist and can not be seen until January 17, 2015.

## 2014-12-13 ENCOUNTER — Ambulatory Visit (INDEPENDENT_AMBULATORY_CARE_PROVIDER_SITE_OTHER): Payer: 59 | Admitting: Pulmonary Disease

## 2014-12-13 ENCOUNTER — Encounter: Payer: Self-pay | Admitting: Pulmonary Disease

## 2014-12-13 DIAGNOSIS — J84112 Idiopathic pulmonary fibrosis: Secondary | ICD-10-CM

## 2014-12-13 DIAGNOSIS — J9611 Chronic respiratory failure with hypoxia: Secondary | ICD-10-CM | POA: Diagnosis not present

## 2014-12-13 MED ORDER — PREDNISONE 20 MG PO TABS: 20.0000 mg | ORAL_TABLET | Freq: Every day | ORAL | Status: DC

## 2014-12-13 NOTE — Telephone Encounter (Signed)
Patient notified

## 2014-12-13 NOTE — Assessment & Plan Note (Signed)
She has completed anti-viral therapy and is now hepatitis C free as per the test from June 30.  Plan: She will have a repeat hepatitis C value checked in 3 months

## 2014-12-13 NOTE — Assessment & Plan Note (Signed)
We will prescribe a portable oxygen concentrator, 2 L at rest, 3 L with exertion and daily at bedtime

## 2014-12-13 NOTE — Patient Instructions (Addendum)
We will arrange for you to have an Oxy-go portable oxygen concentratror Start taking prednisone 20mg  daily We will arrange a pulmonary function test and 6 minute walk in 3 months We will see you back in 3 months after the repeat hepatitis C level has been resulted

## 2014-12-13 NOTE — Assessment & Plan Note (Signed)
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 She has been steroid responsive in the past. I do not believe that this is idiopathic pulmonary fibrosis.  Now that she has completed treatment for the hepatitis C I feel comfortable resuming immunosuppression.  Plan: Resume prednisone 20 mg daily After 3 months of prednisone she will have a repeat hepatitis C level checked and if that is negative then we will start another immunosuppressant such as Imuran Continue pulmonary rehabilitation Stay active Continue to stay in contact with the [place] [facility] Repeat pulmonary function test and 6 minute walk in 3 months

## 2014-12-13 NOTE — Progress Notes (Signed)
Subjective:    Patient ID: Erika Martinez, female    DOB: [birth year], 59 y.o.   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  Erika Martinez was found to have connective tissue disease associated UIP and was referred to the [facility] center.  Erika Martinez was found to have Hepatitis C in 2015 and her transplant evaluation has been held further as of 06/2014.  2007 CXR > question ILD 08/2013 CXR > clear progression of interstitial infiltrate and volume loss 08/2013 cXR > pneumonia 10/06/2013 CT chest ([place] read) > compatible with UIP, mildly enlarged right paratracheal lymph node, likely old granulomatous disease, mild cardiomegaly 10/06/2013 pulmonary function test [facility]> flow volume loop compatible with obstruction, ratio 77%, FEV1 0.83 L (39% predicted, -4% change with bronchodilator), unable to perform lung volumes, DLCO 9.3 (54% predicted) 12/09/2014 6MW > 990 feet, O2 saturation 80% on 2L > improved with 3L 01/2014 ANA neg, RF positive 44.7, SCL-70 neg, SSA neg, SSB neg, CCP negative, adolase normal, CRP 0.35 (normal) 02/2014 Started prednisone, referred to [place] [facility] 03/01/2014 6MW 900 feet, dropped to 79% on 3L [place] 03/09/2014 Ratio 77%, FEV1 0.70L (33% pred), FVC 0.91L (32% pred), DLCO 5.4 (31% pred) 08/08/2014 PFT> Ratio 63% post (but 78% pre), FEV1 0.74L (39% pred), FVC 1.18L (48% pred),   HPI Chief Complaint  Patient presents with  . Acute Visit    Pt states hep C is gone, also interested in seeing about getting a POC that will accomodate her while exercising.     Erika Martinez completed her Harvoni treatment last week and Erika Martinez was told this morning Erika Martinez was Hepatitis C free. Her breathing has been stable with the exception of when the humidity is out.  Erika Martinez has been walking up to a mile.  Erika Martinez is nervous about starting prednisone back, but is OK with  the idea. Erika Martinez plans to return to work this year in September.  Erika Martinez is frustrated by the distance that Erika Martinez has to walk to get in to our clinic.  Erika Martinez says it makes her short of breath.  Erika Martinez is still going to the gym and has been going regularly for 2 weeks now. Erika Martinez wants a POC script.  Past Medical History  Diagnosis Date  . Asthma   . Mitral valve prolapse   . Seasonal allergies   . Hypertension   . Abnormal hemoglobin      Review of Systems  Constitutional: Negative for fever, chills and fatigue.  HENT: Negative for postnasal drip, rhinorrhea and sinus pressure.   Respiratory: Positive for shortness of breath. Negative for cough and wheezing.   Cardiovascular: Negative for chest pain, palpitations and leg swelling.       Objective:   Physical Exam  Filed Vitals:   12/13/14 1049  BP: 134/72  Pulse: 103  Height: 5\' 2"  (1.575 m)  Weight: 124 lb (56.246 kg)  SpO2: 100%  2L [place]   Gen: well appearing, no acute distress HEENT: NCAT, EOMi, OP clear PULM: Crackles R base, CTA otherwise CV: RRR, no mgr, no JVD AB: BS+, soft, nontender, no hsm Ext: warm, no edema, + clubbing, no cyanosis Derm: no rash or skin breakdown  Records from GI clinic reviewed, Erika Martinez is being treated with Harvoni for the hepatitis C  12/07/2014 Hep C level: Hepatitis C Quantitation - [facility] HCV Not Detected IU/mL Test Information: Comment Comment:  The quantitative range of this assay is 15 IU/mL to 100 million IU/mL.      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 Erika Martinez has a scleroderma type picture. Erika Martinez has been steroid responsive in the past. I do not believe that this is idiopathic pulmonary fibrosis.  Now that Erika Martinez has completed treatment for the hepatitis C I feel comfortable resuming immunosuppression.  Plan: Resume prednisone 20 mg daily After 3 months of prednisone Erika Martinez will  have a repeat hepatitis C level checked and if that is negative then we will start another immunosuppressant such as Imuran Continue pulmonary rehabilitation Stay active Continue to stay in contact with the [facility] transplant clinic Repeat pulmonary function test and 6 minute walk in 3 months  Chronic hypoxemic respiratory failure We will prescribe a portable oxygen concentrator, 2 L at rest, 3 L with exertion and daily at bedtime  Hepatitis C antibody test positive Erika Martinez has completed anti-viral therapy and is now hepatitis C free as per the test from June 30.  Plan: Erika Martinez will have a repeat hepatitis C value checked in 3 months  > 25 minutes spent today in clinic with [place]  Updated Medication List Outpatient Encounter Prescriptions as of 12/13/2014  Medication Sig  . albuterol (PROVENTIL HFA;VENTOLIN HFA) 108 (90 BASE) MCG/ACT inhaler Inhale 2 puffs into the lungs every 4 (four) hours as needed for wheezing or shortness of breath.  . ALPRAZolam (XANAX) 0.5 MG tablet Take 0.5 mg by mouth 3 (three) times daily as needed for anxiety.  . Azelastine-Fluticasone 137-50 MCG/ACT SUSP Place 2 puffs into the nose daily.  . cetirizine (ZYRTEC) 10 MG tablet Take 10 mg by mouth daily.  . cholecalciferol (VITAMIN D) 1000 UNITS tablet Take 2,000 Units by mouth daily.  Marland Kitchen diltiazem (CARDIZEM) 60 MG tablet Take 60 mg by mouth daily.   . Fluticasone-Salmeterol (ADVAIR DISKUS) 500-50 MCG/DOSE AEPB Inhale 1 puff into the lungs 2 (two) times daily.  Marland Kitchen omeprazole (PRILOSEC) 20 MG capsule Take 20 mg by mouth daily.  . vitamin B-12 (CYANOCOBALAMIN) 250 MCG tablet Place 250 mcg under the tongue daily.  . predniSONE (DELTASONE) 20 MG tablet Take 1 tablet (20 mg total) by mouth daily.  . [DISCONTINUED] Ledipasvir-Sofosbuvir 90-400 MG TABS Take 1 tablet by mouth daily.   No facility-administered encounter medications on file as of 12/13/2014.

## 2014-12-14 ENCOUNTER — Telehealth: Payer: Self-pay | Admitting: Pulmonary Disease

## 2014-12-14 MED ORDER — FLUTICASONE-SALMETEROL 500-50 MCG/DOSE IN AEPB: 1.0000 | INHALATION_SPRAY | Freq: Two times a day (BID) | RESPIRATORY_TRACT | Status: DC

## 2014-12-14 NOTE — Telephone Encounter (Signed)
Spoke with pt. States that she needs a refill on Advair. This has been taken care of. Nothing further was needed.

## 2014-12-15 ENCOUNTER — Encounter: Payer: 59 | Admitting: *Deleted

## 2014-12-15 DIAGNOSIS — J8410 Pulmonary fibrosis, unspecified: Secondary | ICD-10-CM | POA: Diagnosis not present

## 2014-12-15 NOTE — Progress Notes (Signed)
Daily Session Note  Patient Details  Name: Erika Martinez MRN: [id number] Date of Birth: 13-May-1956 Referring Provider:  Steele Sizer, MD  Encounter Date: 12/15/2014  Check In:     Session Check In - 12/15/14 1220    Check-In   Staff Present Frederich Cha RRT, RCP Respiratory Therapist;Milan Perkins Dillard Essex MS, ACSM CEP Exercise Physiologist   ER physicians immediately available to respond to emergencies [name] immediately available ER MD   Physician(s) Kerman Passey and Lord   Medication changes reported     No   Fall or balance concerns reported    No   Warm-up and Cool-down Performed on first and last piece of equipment   VAD Patient? No   Pain Assessment   Currently in Pain? No/denies   Multiple Pain Sites No         Goals Met:  Proper associated with RPD/PD & O2 Sat Independence with exercise equipment Using PLB without cueing & demonstrates good technique Exercise tolerated well Personal goals reviewed Strength training completed today  Goals Unmet:  O2 Sat- O2 at 80 on XR and quickly increased to 94 with rest, O2 at 84 on treadmill while demonstrating pursed lip breathing, increased to 93 with rest.   Goals Comments:   Dr. Emily Filbert is Medical Director for [place] and [name].

## 2014-12-17 ENCOUNTER — Encounter: Payer: Self-pay | Admitting: Family Medicine

## 2014-12-18 ENCOUNTER — Telehealth: Payer: Self-pay | Admitting: Pulmonary Disease

## 2014-12-18 DIAGNOSIS — J8410 Pulmonary fibrosis, unspecified: Secondary | ICD-10-CM

## 2014-12-18 MED ORDER — ALBUTEROL SULFATE HFA 108 (90 BASE) MCG/ACT IN AERS: 2.0000 | INHALATION_SPRAY | RESPIRATORY_TRACT | Status: DC | PRN

## 2014-12-18 NOTE — Progress Notes (Signed)
Daily Session Note  Patient Details  Name: SUN KIHN MRN: [id number] Date of Birth: 21-Jul-1955 Referring Provider:  Steele Sizer, MD  Encounter Date: 12/18/2014  Check In:     Session Check In - 12/18/14 1221    Check-In   Staff Present Carson Myrtle BS, RRT, Respiratory Therapist;Macaila Tahir BS, ACSM EP-C, Exercise Physiologist   ER physicians immediately available to respond to emergencies [name] immediately available ER MD   Physician(s) goodman and wiilliams   Medication changes reported     No   Fall or balance concerns reported    No   Warm-up and Cool-down Performed on first and last piece of equipment   VAD Patient? No   Pain Assessment   Currently in Pain? No/denies         Goals Met:  Proper associated with RPD/PD & O2 Sat Exercise tolerated well No report of cardiac concerns or symptoms Strength training completed today  Goals Unmet:  Not Applicable  Goals Comments:   Dr. Emily Filbert is Medical Director for [place] and [name].

## 2014-12-18 NOTE — Telephone Encounter (Signed)
Called pt and aware RX sent in. Nothing further needed 

## 2014-12-20 ENCOUNTER — Encounter: Payer: 59 | Admitting: *Deleted

## 2014-12-20 DIAGNOSIS — J189 Pneumonia, unspecified organism: Secondary | ICD-10-CM

## 2014-12-20 DIAGNOSIS — J8410 Pulmonary fibrosis, unspecified: Secondary | ICD-10-CM | POA: Diagnosis not present

## 2014-12-20 NOTE — Progress Notes (Signed)
Daily Session Note  Patient Details  Name: ASHMI BLAS MRN: [id number] Date of Birth: 1956-04-30 Referring Provider:  Steele Sizer, MD  Encounter Date: 12/20/2014  Check In:     Session Check In - 12/20/14 1150    Check-In   Staff Present Laureen Owens Shark BS, RRT, Respiratory Therapist;Katrianna Friesenhahn Dillard Essex MS, ACSM CEP Exercise Physiologist;Steven Way BS, ACSM EP-C, Exercise Physiologist   ER physicians immediately available to respond to emergencies [name] immediately available ER MD   Physician(s) Clearnce Hasten and Quale   Medication changes reported     No   Fall or balance concerns reported    No   Warm-up and Cool-down Performed on first and last piece of equipment   VAD Patient? No   Pain Assessment   Currently in Pain? No/denies   Multiple Pain Sites No         Goals Met:  Proper associated with RPD/PD & O2 Sat Independence with exercise equipment Using PLB without cueing & demonstrates good technique Exercise tolerated well Strength training completed today  Goals Unmet:  Not Applicable  Goals Comments:    Dr. Emily Filbert is Medical Director for [place] and [name].

## 2014-12-22 ENCOUNTER — Encounter: Payer: 59 | Admitting: *Deleted

## 2014-12-22 DIAGNOSIS — J8410 Pulmonary fibrosis, unspecified: Secondary | ICD-10-CM | POA: Diagnosis not present

## 2014-12-22 NOTE — Progress Notes (Signed)
Daily Session Note  Patient Details  Name: Erika Martinez MRN: [id number] Date of Birth: 1956-04-03 Referring Provider:  Steele Sizer, MD  Encounter Date: 12/22/2014  Check In:     Session Check In - 12/22/14 1222    Check-In   Staff Present Frederich Cha RRT, RCP Respiratory Therapist;Kail Fraley Dillard Essex MS, ACSM CEP Exercise Physiologist;Carroll Enterkin RN, BSN   ER physicians immediately available to respond to emergencies [name] immediately available ER MD   Physician(s) Edd Fabian and Joni Fears   Medication changes reported     No   Fall or balance concerns reported    No   Warm-up and Cool-down Performed on first and last piece of equipment   VAD Patient? No   Pain Assessment   Currently in Pain? No/denies   Multiple Pain Sites No         Goals Met:  Proper associated with RPD/PD & O2 Sat Independence with exercise equipment Using PLB without cueing & demonstrates good technique Exercise tolerated well Personal goals reviewed Strength training completed today  Goals Unmet:  Not Applicable  Goals Comments: Using 4-6L/O2 on equipment instead of only 4L/O2. Needs the high O2 for more strenuous exercise such as the treadmill.    Dr. Emily Filbert is Medical Director for [place] and [name].

## 2014-12-25 ENCOUNTER — Encounter: Payer: 59 | Admitting: Respiratory Therapy

## 2014-12-25 ENCOUNTER — Other Ambulatory Visit: Payer: Self-pay

## 2014-12-25 DIAGNOSIS — J8410 Pulmonary fibrosis, unspecified: Secondary | ICD-10-CM

## 2014-12-25 MED ORDER — ALBUTEROL SULFATE HFA 108 (90 BASE) MCG/ACT IN AERS: 2.0000 | INHALATION_SPRAY | RESPIRATORY_TRACT | Status: DC | PRN

## 2014-12-25 NOTE — Progress Notes (Signed)
Daily Session Note  Patient Details  Name: Erika Martinez MRN: [id number] Date of Birth: 07-03-1955 Referring Provider:  Juanito Doom, MD  Encounter Date: 12/25/2014  Check In:      Goals Met:  Proper associated with RPD/PD & O2 Sat Independence with exercise equipment Using PLB without cueing & demonstrates good technique Exercise tolerated well Strength training completed today  Goals Unmet:  Not Applicable  Goals Comments: [name] is moving to another apartment which does have a gym. She plans to add a 4th day of exercise once moved in to her new place.   Dr. Emily Filbert is Medical Director for [place] and [name].

## 2014-12-25 NOTE — Progress Notes (Signed)
Pulmonary Individual Treatment Plan  Patient Details  Name: Erika Martinez MRN: [id number] Date of Birth: October 05, 1955 Referring Provider:  Juanito Doom, MD  Initial Encounter Date: 12/05/2014  Visit Diagnosis: Pulmonary interstitial fibrosis  Patient's Home Medications on Admission:  Current outpatient prescriptions:    albuterol (PROVENTIL HFA;VENTOLIN HFA) 108 (90 BASE) MCG/ACT inhaler, Inhale 2 puffs into the lungs every 4 (four) hours as needed for wheezing or shortness of breath., Disp: 8 g, Rfl: 2   ALPRAZolam (XANAX) 0.5 MG tablet, Take 0.5 mg by mouth 3 (three) times daily as needed for anxiety., Disp: , Rfl:    Azelastine-Fluticasone 137-50 MCG/ACT SUSP, Place 2 puffs into the nose daily., Disp: , Rfl:    cetirizine (ZYRTEC) 10 MG tablet, Take 10 mg by mouth daily., Disp: , Rfl:    cholecalciferol (VITAMIN D) 1000 UNITS tablet, Take 2,000 Units by mouth daily., Disp: , Rfl:    diltiazem (CARDIZEM) 60 MG tablet, Take 60 mg by mouth daily. , Disp: , Rfl:    Fluticasone-Salmeterol (ADVAIR DISKUS) 500-50 MCG/DOSE AEPB, Inhale 1 puff into the lungs 2 (two) times daily., Disp: 60 each, Rfl: 5   omeprazole (PRILOSEC) 20 MG capsule, Take 20 mg by mouth daily., Disp: , Rfl:    predniSONE (DELTASONE) 20 MG tablet, Take 1 tablet (20 mg total) by mouth daily., Disp: 30 tablet, Rfl: 3   vitamin B-12 (CYANOCOBALAMIN) 250 MCG tablet, Place 250 mcg under the tongue daily., Disp: , Rfl:   Past Medical History: Past Medical History  Diagnosis Date   Asthma    Mitral valve prolapse    Seasonal allergies    Hypertension    Abnormal hemoglobin     Tobacco Use: History  Smoking status   Never Smoker   Smokeless tobacco   Never Used    Labs: Recent Review Flowsheet Data    There is no flowsheet data to display.       ADL UCSD:     ADL UCSD      12/05/14 0920       ADL UCSD   ADL Phase Entry     SOB Score total 15     Rest 0     Walk 0     Stairs 1      Bath 0     Dress 0     Shop 1         Pulmonary Function Assessment:     Pulmonary Function Assessment - 12/05/14 0920    Pulmonary Function Tests   FVC% 48 %   FEV1% 50 %   FEV1/FVC Ratio 63   DLCO% 54 %   Breath   Bilateral Breath Sounds Basilar;Rales   Shortness of Breath Yes      Exercise Target Goals:    Exercise Program Goal: Individual exercise prescription set with THRR, safety & activity barriers. Participant demonstrates ability to understand and report RPE using BORG scale, to self-measure pulse accurately, and to acknowledge the importance of the exercise prescription.  Exercise Prescription Goal: Starting with aerobic activity 30 plus minutes a day, 3 days per week for initial exercise prescription. Provide home exercise prescription and guidelines that participant acknowledges understanding prior to discharge.  Activity Barriers & Risk Stratification:     Activity Barriers & Risk Stratification - 12/05/14 0920    Activity Barriers & Risk Stratification   Activity Barriers None      6 Minute Walk:     6 Minute Walk  12/05/14 1341       6 Minute Walk   Phase Initial     Distance 830 feet     Walk Time 6 minutes     Resting HR 72 bpm     Resting BP 132/72 mmHg     Max Ex. HR 98 bpm     Max Ex. BP 146/78 mmHg     RPE 15     Perceived Dyspnea  4     Symptoms No        Initial Exercise Prescription:     Initial Exercise Prescription - 12/08/14 1100    Treadmill   MPH 1.5   Grade 0   Minutes 10   NuStep   Level 2   Watts 40   Minutes 15   REL-XR   Level 2   Watts 40   Minutes 10   Resistance Training   Training Prescription Yes   Weight 2   Reps 10-15      Exercise Prescription Changes:     Exercise Prescription Changes      12/19/14 0800           Response to Exercise   Blood Pressure (Admit) --  Entry for 12/18/2014          Discharge Exercise Prescription (Final Exercise Prescription Changes):      Exercise Prescription Changes - 12/19/14 0800    Response to Exercise   Blood Pressure (Admit) --  Entry for 12/18/2014       Nutrition:  Target Goals: Understanding of nutrition guidelines, daily intake of sodium <1584m, cholesterol <2016m calories 30% from fat and 7% or less from saturated fats, daily to have 5 or more servings of fruits and vegetables.  Biometrics:    Nutrition Therapy Plan and Nutrition Goals:     Nutrition Therapy & Goals - 12/05/14 0920    Nutrition Therapy   Diet Ms BaCullensrefers not to meet with the dietitian; she manages her diet well with small portion sizes, limits sweets; drinks 6 glasses of water/day and 2 ginger ale soda/day; no caffeine.      Nutrition Discharge: Rate Your Plate Scores:   Psychosocial: Target Goals: Acknowledge presence or absence of depression, maximize coping skills, provide positive support system. Participant is able to verbalize types and ability to use techniques and skills needed for reducing stress and depression.  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 Her physician did not want her traveling due to recent treatment..   Family Dynamics   Good Support System? Yes  Ms BaTobeyas good support from her friends and she is very involved in a IPF Support Group.   Barriers   Psychosocial barriers to participate in program The patient should benefit from training in stress management and relaxation.   Screening  Interventions   Interventions Program counselor consult;Encouraged to exercise      Quality of Life Scores:     Quality of Life - 12/05/14 1052  Quality of Life Scores   Health/Function Pre 17.22 %   Socioeconomic Pre 20.71 %   Psych/Spiritual Pre 24 %   Family Pre 22.8 %   GLOBAL Pre 20.07 %      PHQ-9:     Recent Review Flowsheet Data    Depression screen [facility] 2/9 12/05/2014   Decreased Interest 1   Down, Depressed, Hopeless 0   PHQ - 2 Score 1      Psychosocial Evaluation and Intervention:     Psychosocial Evaluation - 12/20/14 1237    Psychosocial Evaluation & Interventions   Comments Counselor met with Ms. Fitzsimmons today for initial psychosocial evaluation.  She has returned to this program due to shortness of breath.  She reports she stopped exercising since she was working so many hours and "regressed" as a result.  Her mood is reportedly better with no depressive symptoms currently.  She continues to struggle with some anxiety symptoms and takes her medication for this as needed.  Ms. Markgraf reports she is moving in the near future to a facility that has a 24 hour gym which will help her be more consistent in maintaining her goals once she completes this program.  Counselor will continue to follow with her as needed.     Continued Psychosocial Services Needed Yes  Follow up on mood and anxiety symptoms closer to her job starting back up for the fall.        Psychosocial Re-Evaluation:  Education: Education Goals: Education classes will be provided on a weekly basis, covering required topics. Participant will state understanding/return demonstration of topics presented.  Learning Barriers/Preferences:     Learning Barriers/Preferences - 12/05/14 0920    Learning Barriers/Preferences   Learning Barriers None   Learning Preferences Group Instruction;Individual Instruction;Pictoral;Skilled Demonstration;Verbal Instruction;Video;Written Material       Education Topics: Initial Evaluation Education: - Verbal, written and demonstration of respiratory meds, RPE/PD scales, oximetry and breathing techniques. Instruction on use of nebulizers and MDIs: cleaning and proper use, rinsing mouth with steroid doses and importance of monitoring MDI activations.          Pulmonary Rehab from 12/22/2014 in [place]   Date  12/05/14   Educator  LB   Instruction Review Code  2- meets goals/outcomes      General Nutrition Guidelines/Fats and Fiber: -Group instruction provided by verbal, written material, models and posters to present the general guidelines for heart healthy nutrition. Gives an explanation and review of dietary fats and fiber.   Controlling Sodium/Reading Food Labels: -Group verbal and written material supporting the discussion of sodium use in heart healthy nutrition. Review and explanation with models, verbal and written materials for utilization of the food label.   Exercise Physiology & Risk Factors: - Group verbal and written instruction with models to review the exercise physiology of the cardiovascular system and associated critical values. Details cardiovascular disease risk factors and the goals associated with each risk factor.   Aerobic Exercise & Resistance Training: - Gives group verbal and written discussion on the health impact of inactivity. On the components of aerobic and resistive training programs and the benefits of this training and how to safely progress through these programs.   Flexibility, Balance, General Exercise Guidelines: - Provides group verbal and written instruction on the benefits of flexibility and balance training programs. Provides general exercise guidelines with specific guidelines to those with heart or lung disease. Demonstration and skill practice provided.   Stress Management: -  Provides group verbal and written instruction about the health risks  of elevated stress, cause of high stress, and healthy ways to reduce stress.   Depression: - Provides group verbal and written instruction on the correlation between heart/lung disease and depressed mood, treatment options, and the stigmas associated with seeking treatment.   Exercise & Equipment Safety: - Individual verbal instruction and demonstration of equipment use and safety with use of the equipment.      Pulmonary Rehab from 12/22/2014 in [place]   Date  12/08/14   Educator  [place]   Instruction Review Code  2- meets goals/outcomes      Infection Prevention: - Provides verbal and written material to individual with discussion of infection control including proper hand washing and proper equipment cleaning during exercise session.      Pulmonary Rehab from 12/22/2014 in [place]   Date  12/08/14   Educator  [place]   Instruction Review Code  2- meets goals/outcomes      Falls Prevention: - Provides verbal and written material to individual with discussion of falls prevention and safety.      Pulmonary Rehab from 12/22/2014 in [place]   Date  12/05/14   Educator  LB   Instruction Review Code  2- meets goals/outcomes      Diabetes: - Individual verbal and written instruction to review signs/symptoms of diabetes, desired ranges of glucose level fasting, after meals and with exercise. Advice that pre and post exercise glucose checks will be done for 3 sessions at entry of program.   Chronic Lung Diseases: - Group verbal and written instruction to review new updates, new respiratory medications, new advancements in procedures and treatments. Provide informative websites and "800" numbers of self-education.   Lung Procedures: - Group verbal and written instruction to describe testing methods done to diagnose lung disease. Review the outcome of test results.  Describe the treatment choices: Pulmonary Function Tests, ABGs and oximetry.   Energy Conservation: - Provide group verbal and written instruction for methods to conserve energy, plan and organize activities. Instruct on pacing techniques, use of adaptive equipment and posture/positioning to relieve shortness of breath.   Triggers: - Group verbal and written instruction to review types of environmental controls: home humidity, furnaces, filters, dust mite/pet prevention, HEPA vacuums. To discuss weather changes, air quality and the benefits of nasal washing.      Pulmonary Rehab from 12/22/2014 in [place]   Date  12/18/14   Educator  LB   Instruction Review Code  2- meets goals/outcomes      Exacerbations: - Group verbal and written instruction to provide: warning signs, infection symptoms, calling MD promptly, preventive modes, and value of vaccinations. Review: effective airway clearance, coughing and/or vibration techniques. Create an Sports administrator.   Oxygen: - Individual and group verbal and written instruction on oxygen therapy. Includes supplement oxygen, available portable oxygen systems, continuous and intermittent flow rates, oxygen safety, concentrators, and Medicare reimbursement for oxygen.      Pulmonary Rehab from 12/22/2014 in [place]   Date  12/05/14   Educator  LB   Instruction Review Code  2- meets goals/outcomes      Respiratory Medications: - Group verbal and written instruction to review medications for lung disease. Drug class, frequency, complications, importance of spacers, rinsing mouth after steroid MDI's, and proper cleaning methods for nebulizers.  Pulmonary Rehab from 12/22/2014 in [place]   Date  12/22/14   Educator  [place]   Instruction Review Code  2- meets goals/outcomes [Spacer and instructions given to pt. ]      AED/CPR: -  Group verbal and written instruction with the use of models to demonstrate the basic use of the AED with the basic ABC's of resuscitation.   Breathing Retraining: - Provides individuals verbal and written instruction on purpose, frequency, and proper technique of diaphragmatic breathing and pursed-lipped breathing. Applies individual practice skills.      Pulmonary Rehab from 12/22/2014 in [place]   Date  12/05/14   Educator  LB   Instruction Review Code  2- meets goals/outcomes      Anatomy and Physiology of the Lungs: - Group verbal and written instruction with the use of models to provide basic lung anatomy and physiology related to function, structure and complications of lung disease.   Heart Failure: - Group verbal and written instruction on the basics of heart failure: signs/symptoms, treatments, explanation of ejection fraction, enlarged heart and cardiomyopathy.   Sleep Apnea: - Individual verbal and written instruction to review Obstructive Sleep Apnea. Review of risk factors, methods for diagnosing and types of masks and machines for OSA.   Anxiety: - Provides group, verbal and written instruction on the correlation between heart/lung disease and anxiety, treatment options, and management of anxiety.   Relaxation: - Provides group, verbal and written instruction about the benefits of relaxation for patients with heart/lung disease. Also provides patients with examples of relaxation techniques.   Knowledge Questionnaire Score:     Knowledge Questionnaire Score - 12/05/14 0920    Knowledge Questionnaire Score   Pre Score -2      Personal Goals and Risk Factors at Admission:     Personal Goals and Risk Factors at Admission - 12/05/14 1039    Personal Goals and Risk Factors on Admission    Weight Management No   Increase Aerobic Exercise and Physical Activity Yes   Intervention While in program, learn and follow the  exercise prescription taught. Start at a low level workload and increase workload after able to maintain previous level for 30 minutes. Increase time before increasing intensity.  Ms Angelini was a member of [facility] and plans to return there after LW.   Quit Smoking No   Take Less Medication No   Understand more about Heart/Pulmonary Disease. Yes   Intervention While in program utilize professionals for any questions, and attend the education sessions. Great websites to use are www.americanheart.org or www.lung.org for reliable information.  Ms Arrambide is very motivated to learn new information on medical issues. She is planning to start a Pulmonary Support Group.   Improve shortness of breath with ADL's Yes   Intervention While in program, learn and follow the exercise prescription taught. Start at a low level workload and increase workload ad advised by the exercise physiologist. Increase time before increasing intensity.  Her UCSD SOB Questionnaire was scored 15.  She knows to pace herself with activities.   Develop more efficient breathing techniques such as purse lipped breathing and diaphragmatic breathing; and practicing self-pacing with activity Yes   Intervention While in program, learn and utilize the specific breathing techniques taught to you. Continue to practice and use the techniques as needed.  Ms Pillars does perform PLB, but does forget to do it at times.   Increase knowledge of respiratory medications  and ability to use respiratory devices properly.  Yes   Intervention While in program learn and demonstrate appropriate use of your oxygen therapy by increasing flow with exertion, manage oxygen tank operation, including continuous and intermittent flow.  Understanding oxygen is a drug ordered by your physician.;While in program, learn to administer MDI, nebulizer, and spacer properly.;Learn to take respiratory medicine as ordered.;While in program, learn to Clean MDI, nebulizers,  and spacers properly.  She has a good understanding of her Advair, Proventil, and spacer. She uses 2-4l/m of oxygen - adjusting the flows as needed; she has 2 portable tanks - one a concentrator and the other  [place].      Personal Goals and Risk Factors Review:      Goals and Risk Factor Review      12/18/14 1130 12/20/14 1130         Understand more about Heart/Pulmonary Disease   Goals Progress/Improvement seen  Yes       Comments Ms Burnsed is planning to start a Pulmonary Fibrosis Support Group in the [place], [place] area. She has support from the IPF Association and good educational material.       Improve shortness of breath with ADL's   Goals Progress/Improvement seen  Yes       Comments Ms Dargan states she already feels a difference with her breathing even after 5 sessions.       Breathing Techniques   Goals Progress/Improvement seen   Yes      Comments  Ms Bieker uses PLB during her exercise goals and during activity at home.      Increase knowledge of respiratory medications   Goals Progress/Improvement seen  Yes       Comments Increased Ms Mehan oxygen to 6l/m on the XR - discussed the importance of keeping her O2Sat's in the 90's during activity.          Personal Goals Discharge:    Comments:30 day review

## 2014-12-26 ENCOUNTER — Encounter: Payer: Self-pay | Admitting: Internal Medicine

## 2014-12-27 ENCOUNTER — Other Ambulatory Visit: Payer: Self-pay

## 2014-12-27 ENCOUNTER — Encounter: Payer: 59 | Admitting: *Deleted

## 2014-12-27 ENCOUNTER — Inpatient Hospital Stay: Payer: 59

## 2014-12-27 ENCOUNTER — Inpatient Hospital Stay: Payer: 59 | Attending: Internal Medicine

## 2014-12-27 DIAGNOSIS — D751 Secondary polycythemia: Secondary | ICD-10-CM | POA: Insufficient documentation

## 2014-12-27 DIAGNOSIS — J8410 Pulmonary fibrosis, unspecified: Secondary | ICD-10-CM

## 2014-12-27 LAB — HEMATOCRIT: HEMATOCRIT: 43.7 % (ref 35.0–47.0)

## 2014-12-27 NOTE — Progress Notes (Signed)
Daily Session Note  Patient Details  Name: Erika Martinez MRN: [id number] Date of Birth: 12-Apr-1956 Referring Provider:  Steele Sizer, MD  Encounter Date: 12/27/2014  Check In:     Session Check In - 12/27/14 1150    Check-In   Staff Present Laureen Owens Shark BS, RRT, Respiratory Therapist;Aranda Bihm Dillard Essex MS, ACSM CEP Exercise Physiologist;Steven Way BS, ACSM EP-C, Exercise Physiologist   ER physicians immediately available to respond to emergencies [name] immediately available ER MD   Physician(s) Joni Fears and Edd Fabian   Medication changes reported     No   Fall or balance concerns reported    No   Warm-up and Cool-down Performed on first and last piece of equipment   VAD Patient? No   Pain Assessment   Currently in Pain? No/denies   Multiple Pain Sites No         Goals Met:  Proper associated with RPD/PD & O2 Sat [facility] with exercise equipment Using PLB without cueing & demonstrates good technique Exercise tolerated well Strength training completed today  Goals Unmet:  Not Applicable  Goals Comments: [name] has given me information on a germ free vaporizer for the bedroom and information on a dehumidifier.   Dr. Emily Filbert is Medical Director for [place] and [name].

## 2014-12-29 ENCOUNTER — Encounter: Payer: 59 | Admitting: *Deleted

## 2014-12-29 DIAGNOSIS — J8410 Pulmonary fibrosis, unspecified: Secondary | ICD-10-CM

## 2014-12-29 NOTE — Progress Notes (Signed)
Daily Session Note  Patient Details  Name: Erika Martinez MRN: [id number] Date of Birth: June 03, 1956 Referring Provider:  Steele Sizer, MD  Encounter Date: 12/29/2014  Check In:     Session Check In - 12/29/14 1201    Check-In   Staff Present Frederich Cha RRT, RCP Respiratory Therapist;Carroll Enterkin RN, Drusilla Kanner MS, ACSM CEP Exercise Physiologist   ER physicians immediately available to respond to emergencies [name] immediately available ER MD   Physician(s) Archie Balboa and Corky Downs   Medication changes reported     No   Fall or balance concerns reported    No   Warm-up and Cool-down Performed on first and last piece of equipment   VAD Patient? No   Pain Assessment   Currently in Pain? No/denies   Multiple Pain Sites No         Goals Met:  Proper associated with RPD/PD & O2 Sat [facility] with exercise equipment Using PLB without cueing & demonstrates good technique Exercise tolerated well Strength training completed today  Goals Unmet:  Not Applicable  Goals Comments:    Dr. Emily Filbert is Medical Director for [place] and [name].

## 2015-01-01 ENCOUNTER — Encounter: Payer: 59 | Admitting: *Deleted

## 2015-01-01 ENCOUNTER — Other Ambulatory Visit: Payer: Self-pay | Admitting: Family Medicine

## 2015-01-01 ENCOUNTER — Telehealth: Payer: Self-pay | Admitting: Pulmonary Disease

## 2015-01-01 DIAGNOSIS — J8410 Pulmonary fibrosis, unspecified: Secondary | ICD-10-CM | POA: Diagnosis not present

## 2015-01-01 NOTE — Telephone Encounter (Signed)
Patient requesting refill. 

## 2015-01-01 NOTE — Telephone Encounter (Signed)
Called spoke with pt. Made her aware we did send an order to [facility] on 7/6. Nothing further needed

## 2015-01-01 NOTE — Progress Notes (Signed)
Daily Session Note  Patient Details  Name: Erika Martinez MRN: [id number] Date of Birth: [birth year] Referring Provider:  Juanito Doom, MD  Encounter Date: 01/01/2015  Check In:     Session Check In - 01/01/15 1226    Check-In   Staff Present Laureen Owens Shark BS, RRT, Respiratory Therapist;Susanne Bice RN, BSN, CCRP;Adda Stokes RN, BSN   ER physicians immediately available to respond to emergencies [name] immediately available ER MD   Physician(s) Dr. Archie Balboa and Dr. Corky Downs   Medication changes reported     No   Fall or balance concerns reported    No   Warm-up and Cool-down Performed on first and last piece of equipment   VAD Patient? No   Pain Assessment   Currently in Pain? No/denies         Goals Met:  Proper associated with RPD/PD & O2 Sat Exercise tolerated well  Goals Unmet:  Not Applicable  Goals Comments: Doing well on the Treadmill. Increased XR REL to 15 minutes.   Dr. Emily Filbert is Medical Director for [place] and [name].

## 2015-01-03 DIAGNOSIS — J8410 Pulmonary fibrosis, unspecified: Secondary | ICD-10-CM

## 2015-01-03 NOTE — Progress Notes (Signed)
Daily Session Note  Patient Details  Name: Erika Martinez MRN: [id number] Date of Birth: January 05, 1956 Referring Provider:  Juanito Doom, MD  Encounter Date: 01/03/2015  Check In:     Session Check In - 01/03/15 1218    Check-In   Staff Present Lestine Box BS, ACSM EP-C, Exercise Physiologist;Stacey Blanch Media RRT, RCP Respiratory Therapist   ER physicians immediately available to respond to emergencies [name] immediately available ER MD   Physician(s) paduchowski and lord   Medication changes reported     No   Fall or balance concerns reported    No   Warm-up and Cool-down Performed on first and last piece of equipment   VAD Patient? No   Pain Assessment   Currently in Pain? No/denies         Goals Met:  Proper associated with RPD/PD & O2 Sat Exercise tolerated well No report of cardiac concerns or symptoms Strength training completed today  Goals Unmet:  Not Applicable  Goals Comments:    Dr. Emily Filbert is Medical Director for [place] and [name].

## 2015-01-08 ENCOUNTER — Encounter: Payer: 59 | Attending: Pulmonary Disease | Admitting: *Deleted

## 2015-01-08 DIAGNOSIS — J8410 Pulmonary fibrosis, unspecified: Secondary | ICD-10-CM | POA: Insufficient documentation

## 2015-01-08 NOTE — Progress Notes (Signed)
Daily Session Note  Patient Details  Name: Erika Martinez MRN: [id number] Date of Birth: September 29, 1955 Referring Provider:  Juanito Doom, MD  Encounter Date: 01/08/2015  Check In:     Session Check In - 01/08/15 1252    Check-In   Staff Present Erika Lark RN, BSN, CCRP;Erika Martinez BS, RRT, Respiratory Therapist;Erika Enterkin RN, BSN   ER physicians immediately available to respond to emergencies [name] immediately available ER MD   Physician(s) Drs: Erika Martinez and Erika Martinez   Medication changes reported     No   Fall or balance concerns reported    No   Warm-up and Cool-down Performed on first and last piece of equipment   VAD Patient? No   Pain Assessment   Currently in Pain? No/denies   Multiple Pain Sites No           Exercise Prescription Changes - 01/08/15 1200    Exercise Review   Progression Yes   Treadmill   MPH 1.5   Grade 0   Minutes 15   NuStep   Level 3   Watts 50   Minutes 15   REL-XR   Minutes 15      Goals Met:  Independence with exercise equipment Exercise tolerated well Personal goals reviewed  Goals Unmet:  Not Applicable  Goals Comments: Doing well with exercise prescription progression.                                  Discussed with Erika Martinez to come earlier - she has a lot of set-up, so she cannot get to the last exercise goal. She plans to arrive earlier on Wednesday.   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

## 2015-01-10 ENCOUNTER — Other Ambulatory Visit: Payer: Self-pay | Admitting: Family Medicine

## 2015-01-10 NOTE — Telephone Encounter (Signed)
Patient requesting refill. 

## 2015-01-12 ENCOUNTER — Encounter: Payer: 59 | Admitting: Respiratory Therapy

## 2015-01-12 DIAGNOSIS — J8410 Pulmonary fibrosis, unspecified: Secondary | ICD-10-CM | POA: Diagnosis not present

## 2015-01-12 NOTE — Progress Notes (Signed)
Daily Session Note  Patient Details  Name: Erika Martinez MRN: [id number] Date of Birth: [birth year] Referring Provider:  Juanito Doom, MD  Encounter Date: 01/12/2015  Check In:     Session Check In - 01/12/15 1652    Check-In   Staff Present Frederich Cha RRT, RCP Respiratory Therapist;Carroll Enterkin RN, Drusilla Kanner MS, ACSM CEP Exercise Physiologist   ER physicians immediately available to respond to emergencies [name] immediately available ER MD   Physician(s) Dr. Jimmye Norman and Marcelene Butte   Medication changes reported     No   Fall or balance concerns reported    No   Warm-up and Cool-down Performed on first and last piece of equipment   VAD Patient? No   Pain Assessment   Currently in Pain? No/denies         Goals Met:  Proper associated with RPD/PD & O2 Sat Independence with exercise equipment Using PLB without cueing & demonstrates good technique Exercise tolerated well Strength training completed today  Goals Unmet:  Not Applicable  Goals Comments:    Dr. Emily Filbert is Medical Director for [facility] and [name].

## 2015-01-15 ENCOUNTER — Other Ambulatory Visit: Payer: Self-pay | Admitting: Family Medicine

## 2015-01-15 ENCOUNTER — Telehealth: Payer: Self-pay | Admitting: Family Medicine

## 2015-01-15 MED ORDER — ALPRAZOLAM 0.5 MG PO TABS: 0.5000 mg | ORAL_TABLET | Freq: Three times a day (TID) | ORAL | Status: DC | PRN

## 2015-01-15 NOTE — Telephone Encounter (Signed)
Printed for 30 pills.

## 2015-01-15 NOTE — Telephone Encounter (Signed)
Patient requesting refill. 

## 2015-01-15 NOTE — Telephone Encounter (Signed)
Pt states she needs refill on Alprazolam. Pt is down to 1 or two pills. She states the pharmacy faxed a refill request last week.

## 2015-01-15 NOTE — Telephone Encounter (Signed)
Called in prescription to [facility] for patient.

## 2015-01-17 ENCOUNTER — Encounter: Payer: 59 | Admitting: *Deleted

## 2015-01-17 ENCOUNTER — Ambulatory Visit: Payer: 59 | Admitting: Pulmonary Disease

## 2015-01-17 ENCOUNTER — Encounter: Payer: Self-pay | Admitting: Internal Medicine

## 2015-01-17 DIAGNOSIS — J8410 Pulmonary fibrosis, unspecified: Secondary | ICD-10-CM | POA: Diagnosis not present

## 2015-01-17 NOTE — Progress Notes (Signed)
Daily Session Note  Patient Details  Name: Erika Martinez MRN: [id number] Date of Birth: 13-Nov-1955 Referring Provider:  Juanito Doom, MD  Encounter Date: 01/17/2015  Check In:     Session Check In - 01/17/15 1228    Check-In   Staff Present Candiss Norse MS, ACSM CEP Exercise Physiologist;Laureen Janell Quiet, RRT, Respiratory Therapist;Steven Way BS, ACSM EP-C, Exercise Physiologist;Carroll Enterkin RN, BSN   ER physicians immediately available to respond to emergencies [name] immediately available ER MD   Physician(s) Jimmye Norman and Reita Cliche   Medication changes reported     No   Fall or balance concerns reported    No   Warm-up and Cool-down Performed on first and last piece of equipment   VAD Patient? No   Pain Assessment   Currently in Pain? No/denies   Multiple Pain Sites No         Goals Met:  Proper associated with RPD/PD & O2 Sat Independence with exercise equipment Using PLB without cueing & demonstrates good technique Exercise tolerated well Strength training completed today  Goals Unmet:  Not Applicable  Goals Comments: Reviewed individualized exercise prescription and made increases per departmental policy. Exercise increases were discussed with the patient and they were able to perform the new work loads without issue (no signs or symptoms).     Dr. Emily Filbert is Medical Director for [place] and [name].

## 2015-01-19 ENCOUNTER — Encounter: Payer: 59 | Admitting: *Deleted

## 2015-01-19 DIAGNOSIS — J8410 Pulmonary fibrosis, unspecified: Secondary | ICD-10-CM

## 2015-01-19 NOTE — Progress Notes (Signed)
Daily Session Note  Patient Details  Name: Erika Martinez MRN: [id number] Date of Birth: [birth year] Referring Provider:  Steele Sizer, MD  Encounter Date: 01/19/2015  Check In:     Session Check In - 01/19/15 1141    Check-In   Staff Present Gerlene Burdock RN, BSN;Stacey Blanch Media RRT, RCP Respiratory Therapist;Sherline Eberwein Dillard Essex MS, ACSM CEP Exercise Physiologist   ER physicians immediately available to respond to emergencies [name] immediately available ER MD   Physician(s) Jacqualine Code and K[place]   Medication changes reported     No   Fall or balance concerns reported    No   Warm-up and Cool-down Performed on first and last piece of equipment   VAD Patient? No   Pain Assessment   Currently in Pain? No/denies   Multiple Pain Sites No         Goals Met:  Proper associated with RPD/PD & O2 Sat Independence with exercise equipment Using PLB without cueing & demonstrates good technique Exercise tolerated well Personal goals reviewed Strength training completed today  Goals Unmet:  Not Applicable  Goals Comments:    Dr. Emily Filbert is Medical Director for [place] and [name].

## 2015-01-21 ENCOUNTER — Other Ambulatory Visit: Payer: Self-pay | Admitting: Family Medicine

## 2015-01-22 ENCOUNTER — Encounter: Payer: 59 | Admitting: *Deleted

## 2015-01-22 DIAGNOSIS — J8410 Pulmonary fibrosis, unspecified: Secondary | ICD-10-CM

## 2015-01-22 NOTE — Progress Notes (Signed)
Pulmonary Individual Treatment Plan  Patient Details  Name: Erika Martinez MRN: [id number] Date of Birth: 10/23/1955 Referring Provider:  Juanito Doom, MD  Initial Encounter Date: 12/05/2014  Visit Diagnosis: Pulmonary fibrosis  Patient's Home Medications on Admission:  Current outpatient prescriptions:    albuterol (PROVENTIL HFA;VENTOLIN HFA) 108 (90 BASE) MCG/ACT inhaler, Inhale 2 puffs into the lungs every 4 (four) hours as needed for wheezing or shortness of breath., Disp: 8 g, Rfl: 5   ALPRAZolam (XANAX) 0.5 MG tablet, Take 1 tablet (0.5 mg total) by mouth 3 (three) times daily as needed for anxiety., Disp: 30 tablet, Rfl: 0   Azelastine-Fluticasone 137-50 MCG/ACT SUSP, Place 2 puffs into the nose daily., Disp: , Rfl:    cetirizine (ZYRTEC) 10 MG tablet, Take 10 mg by mouth daily., Disp: , Rfl:    cholecalciferol (VITAMIN D) 1000 UNITS tablet, Take 2,000 Units by mouth daily., Disp: , Rfl:    diltiazem (CARDIZEM) 60 MG tablet, TAKE 1 TABLET DAILY, Disp: 30 tablet, Rfl: 5   Fluticasone-Salmeterol (ADVAIR DISKUS) 500-50 MCG/DOSE AEPB, Inhale 1 puff into the lungs 2 (two) times daily., Disp: 60 each, Rfl: 5   omeprazole (PRILOSEC) 20 MG capsule, TAKE ONE CAPSULE BY MOUTH DAILY WITH HARVONI, Disp: 30 capsule, Rfl: 5   predniSONE (DELTASONE) 20 MG tablet, Take 1 tablet (20 mg total) by mouth daily., Disp: 30 tablet, Rfl: 3   vitamin B-12 (CYANOCOBALAMIN) 250 MCG tablet, Place 250 mcg under the tongue daily., Disp: , Rfl:   Past Medical History: Past Medical History  Diagnosis Date   Asthma    Mitral valve prolapse    Seasonal allergies    Hypertension    Abnormal hemoglobin    Plantar wart    Symptomatic menopausal or female climacteric states    Scleroderma     Dr. Pennie Banter   Pulmonary fibrosis    Allergy    Oxygen dependent    Erythrocytosis    Hep C w/o coma, chronic    Lumbago    Anxiety    GERD (gastroesophageal reflux disease)    IBS  (irritable bowel syndrome)    Controlled insomnia    Spondylolisthesis     Tobacco Use: History  Smoking status   Never Smoker   Smokeless tobacco   Never Used    Labs: Recent Review Flowsheet Data    Labs for ITP Cardiac and Pulmonary Rehab Latest Ref Rng 07/14/2013   Cholestrol 0 - 200 mg/dL 169   LDLCALC - 95   HDL 35 - 70 mg/dL 58   Trlycerides 40 - 160 mg/dL 78       ADL UCSD:     ADL UCSD      12/05/14 0920       ADL UCSD   ADL Phase Entry     SOB Score total 15     Rest 0     Walk 0     Stairs 1     Bath 0     Dress 0     Shop 1         Pulmonary Function Assessment:     Pulmonary Function Assessment - 12/05/14 0920    Pulmonary Function Tests   FVC% 48 %   FEV1% 50 %   FEV1/FVC Ratio 63   DLCO% 54 %   Breath   Bilateral Breath Sounds Basilar;Rales   Shortness of Breath Yes      Exercise Target Goals:    Exercise Program Goal:  Individual exercise prescription set with THRR, safety & activity barriers. Participant demonstrates ability to understand and report RPE using BORG scale, to self-measure pulse accurately, and to acknowledge the importance of the exercise prescription.  Exercise Prescription Goal: Starting with aerobic activity 30 plus minutes a day, 3 days per week for initial exercise prescription. Provide home exercise prescription and guidelines that participant acknowledges understanding prior to discharge.  Activity Barriers & Risk Stratification:     Activity Barriers & Risk Stratification - 12/05/14 0920    Activity Barriers & Risk Stratification   Activity Barriers None      6 Minute Walk:     6 Minute Walk      12/05/14 1341       6 Minute Walk   Phase Initial     Distance 830 feet     Walk Time 6 minutes     Resting HR 72 bpm     Resting BP 132/72 mmHg     Max Ex. HR 98 bpm     Max Ex. BP 146/78 mmHg     RPE 15     Perceived Dyspnea  4     Symptoms No        Initial Exercise Prescription:      Initial Exercise Prescription - 12/08/14 1100    Treadmill   MPH 1.5   Grade 0   Minutes 10   NuStep   Level 2   Watts 40   Minutes 15   REL-XR   Level 2   Watts 40   Minutes 10   Resistance Training   Training Prescription Yes   Weight 2   Reps 10-15      Exercise Prescription Changes:     Exercise Prescription Changes      12/19/14 0800 12/27/14 1200 01/01/15 1200 01/08/15 1200 01/17/15 1500   Exercise Review   Progression   Yes Yes Yes   Response to Exercise   Blood Pressure (Admit) --  Entry for 12/18/2014    110/68 mmHg   Blood Pressure (Exercise)     126/74 mmHg   Blood Pressure (Exit)     104/66 mmHg   Heart Rate (Admit)     61 bpm   Heart Rate (Exercise)     110 bpm   Heart Rate (Exit)     88 bpm   Oxygen Saturation (Admit)     100 %  4l/m   Oxygen Saturation (Exercise)     96 %  6l/m   Oxygen Saturation (Exit)     100 %   Rating of Perceived Exertion (Exercise)     12   Perceived Dyspnea (Exercise)     2   Resistance Training   Training Prescription     Yes   Weight     2   Reps     10-12   Treadmill   MPH    1.5 1.5   Grade    0 0   Minutes    15 15   NuStep   Level  _0 Watts  50 50 50 50   Minutes  _1 REL-XR   Level     3   Watts     50   Minutes   _2 01/22/15 1100           Exercise Review   Progression Yes  Response to Exercise   Blood Pressure (Admit) 110/68 mmHg       Blood Pressure (Exercise) 126/74 mmHg       Blood Pressure (Exit) 104/66 mmHg       Heart Rate (Admit) 61 bpm       Heart Rate (Exercise) 110 bpm       Heart Rate (Exit) 88 bpm       Oxygen Saturation (Admit) 100 %  4l/m       Oxygen Saturation (Exercise) 96 %  6l/m       Oxygen Saturation (Exit) 100 %       Rating of Perceived Exertion (Exercise) 12       Perceived Dyspnea (Exercise) 2       Resistance Training   Training Prescription Yes       Weight 2       Reps 10-12       Treadmill   MPH 1.7       Grade 0        Minutes 15       NuStep   Level 3       Watts 50       Minutes 15       REL-XR   Level 3       Watts 50       Minutes 15          Discharge Exercise Prescription (Final Exercise Prescription Changes):     Exercise Prescription Changes - 01/22/15 1100    Exercise Review   Progression Yes   Response to Exercise   Blood Pressure (Admit) 110/68 mmHg   Blood Pressure (Exercise) 126/74 mmHg   Blood Pressure (Exit) 104/66 mmHg   Heart Rate (Admit) 61 bpm   Heart Rate (Exercise) 110 bpm   Heart Rate (Exit) 88 bpm   Oxygen Saturation (Admit) 100 %  4l/m   Oxygen Saturation (Exercise) 96 %  6l/m   Oxygen Saturation (Exit) 100 %   Rating of Perceived Exertion (Exercise) 12   Perceived Dyspnea (Exercise) 2   Resistance Training   Training Prescription Yes   Weight 2   Reps 10-12   Treadmill   MPH 1.7   Grade 0   Minutes 15   NuStep   Level 3   Watts 50   Minutes 15   REL-XR   Level 3   Watts 50   Minutes 15       Nutrition:  Target Goals: Understanding of nutrition guidelines, daily intake of sodium <1570m, cholesterol <2046m calories 30% from fat and 7% or less from saturated fats, daily to have 5 or more servings of fruits and vegetables.  Biometrics:    Nutrition Therapy Plan and Nutrition Goals:     Nutrition Therapy & Goals - 12/05/14 0920    Nutrition Therapy   Diet Ms BaMinervinirefers not to meet with the dietitian; she manages her diet well with small portion sizes, limits sweets; drinks 6 glasses of water/day and 2 ginger ale soda/day; no caffeine.      Nutrition Discharge: Rate Your Plate Scores:   Psychosocial: Target Goals: Acknowledge presence or absence of depression, maximize coping skills, provide positive support system. Participant is able to verbalize types and ability to use techniques and skills needed for reducing stress and depression.  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    Comments --  Recently Ms Boehning could not go on a yearly trip to [place] and was very diappointed. Her physician did not want her traveling due to recent treatment..   Family Dynamics   Good Support System? Yes  Ms Burggraf has good support from her friends and she is very involved in a IPF Support Group.   Barriers   Psychosocial barriers to participate in program The patient should benefit from training in stress management and relaxation.   Screening Interventions   Interventions Program counselor consult;Encouraged to exercise      Quality of Life Scores:     Quality of Life - 12/05/14 1052    Quality of Life Scores   Health/Function Pre 17.22 %   Socioeconomic Pre 20.71 %   Psych/Spiritual Pre 24 %   Family Pre 22.8 %   GLOBAL Pre 20.07 %      PHQ-9:     Recent Review Flowsheet Data    Depression screen [facility] 2/9 12/05/2014   Decreased Interest 1   Down, Depressed, Hopeless 0   PHQ - 2 Score 1      Psychosocial Evaluation and Intervention:     Psychosocial Evaluation - 12/20/14 1237    Psychosocial Evaluation & Interventions   Comments Counselor met with Ms. Marut today for initial psychosocial evaluation.  She has returned to this program due to shortness of breath.  She reports she stopped exercising since she was working so many hours and "regressed" as a result.  Her mood is reportedly better with no depressive symptoms currently.  She continues to struggle with some anxiety symptoms and takes her medication for this as needed.  Ms. Lemaire reports she is moving in the near future to a facility that has a 24 hour gym  which will help her be more consistent in maintaining her goals once she completes this program.  Counselor will continue to follow with her as needed.     Continued Psychosocial Services Needed Yes  Follow up on mood and anxiety symptoms closer to her job starting back up for the fall.        Psychosocial Re-Evaluation:  Education: Education Goals: Education classes will be provided on a weekly basis, covering required topics. Participant will state understanding/return demonstration of topics presented.  Learning Barriers/Preferences:     Learning Barriers/Preferences - 12/05/14 0920    Learning Barriers/Preferences   Learning Barriers None   Learning Preferences Group Instruction;Individual Instruction;Pictoral;Skilled Demonstration;Verbal Instruction;Video;Written Material      Education Topics: Initial Evaluation Education: - Verbal, written and demonstration of respiratory meds, RPE/PD scales, oximetry and breathing techniques. Instruction on use of nebulizers and MDIs: cleaning and proper use, rinsing mouth with steroid doses and importance of monitoring MDI activations.          Pulmonary Rehab from 01/22/2015 in [place]   Date  12/05/14   Educator  LB   Instruction Review Code  2- meets goals/outcomes      General Nutrition Guidelines/Fats and Fiber: -Group instruction provided by verbal, written material, models and posters to present the general guidelines for heart healthy nutrition. Gives an explanation and review of dietary fats and fiber.  Pulmonary Rehab from 01/22/2015 in [place]   Date  01/22/15   Educator  C. Joneen Caraway, RD   Instruction Review Code  2- meets goals/outcomes      Controlling Sodium/Reading Food Labels: -Group verbal and written material supporting the discussion of sodium use in heart healthy nutrition. Review and explanation with models, verbal and written  materials for utilization of the food label.   Exercise Physiology & Risk Factors: - Group verbal and written instruction with models to review the exercise physiology of the cardiovascular system and associated critical values. Details cardiovascular disease risk factors and the goals associated with each risk factor.   Aerobic Exercise & Resistance Training: - Gives group verbal and written discussion on the health impact of inactivity. On the components of aerobic and resistive training programs and the benefits of this training and how to safely progress through these programs.   Flexibility, Balance, General Exercise Guidelines: - Provides group verbal and written instruction on the benefits of flexibility and balance training programs. Provides general exercise guidelines with specific guidelines to those with heart or lung disease. Demonstration and skill practice provided.   Stress Management: - Provides group verbal and written instruction about the health risks of elevated stress, cause of high stress, and healthy ways to reduce stress.      Pulmonary Rehab from 01/22/2015 in [place]   Date  01/03/15   Educator  [facility]   Instruction Review Code  2- meets goals/outcomes      Depression: - Provides group verbal and written instruction on the correlation between heart/lung disease and depressed mood, treatment options, and the stigmas associated with seeking treatment.   Exercise & Equipment Safety: - Individual verbal instruction and demonstration of equipment use and safety with use of the equipment.      Pulmonary Rehab from 01/22/2015 in [place]   Date  01/01/15   Educator  L. Owens Shark, RRT   Instruction Review Code  2- meets goals/outcomes      Infection Prevention: - Provides verbal and written material to individual with discussion of infection control including proper hand washing and proper  equipment cleaning during exercise session.      Pulmonary Rehab from 01/22/2015 in [place]   Date  12/08/14   Educator  [place]   Instruction Review Code  2- meets goals/outcomes      Falls Prevention: - Provides verbal and written material to individual with discussion of falls prevention and safety.      Pulmonary Rehab from 01/22/2015 in [place]   Date  12/05/14   Educator  LB   Instruction Review Code  2- meets goals/outcomes      Diabetes: - Individual verbal and written instruction to review signs/symptoms of diabetes, desired ranges of glucose level fasting, after meals and with exercise. Advice that pre and post exercise glucose checks will be done for 3 sessions at entry of program.   Chronic Lung Diseases: - Group verbal and written instruction to review new updates, new respiratory medications, new advancements in procedures and treatments. Provide informative websites and "800" numbers of self-education.   Lung Procedures: - Group verbal and written instruction to describe testing methods done to diagnose lung disease. Review the outcome of test results. Describe the treatment choices: Pulmonary Function Tests, ABGs and oximetry.      Pulmonary Rehab from 01/22/2015 in [place]  [facility]   Date  01/12/15   Educator  sj   Instruction Review Code  2- meets goals/outcomes      Energy Conservation: - Provide group verbal and written instruction for methods to conserve energy, plan and organize activities. Instruct on pacing techniques, use of adaptive equipment and posture/positioning to relieve shortness of breath.   Triggers: - Group verbal and written instruction to review types of environmental controls: home humidity, furnaces, filters, dust mite/pet prevention, HEPA vacuums. To discuss weather changes, air quality and the benefits of nasal washing.       Pulmonary Rehab from 01/22/2015 in [place]   Date  12/18/14   Educator  LB   Instruction Review Code  2- meets goals/outcomes      Exacerbations: - Group verbal and written instruction to provide: warning signs, infection symptoms, calling MD promptly, preventive modes, and value of vaccinations. Review: effective airway clearance, coughing and/or vibration techniques. Create an Sports administrator.   Oxygen: - Individual and group verbal and written instruction on oxygen therapy. Includes supplement oxygen, available portable oxygen systems, continuous and intermittent flow rates, oxygen safety, concentrators, and Medicare reimbursement for oxygen.      Pulmonary Rehab from 01/22/2015 in [place]   Date  12/05/14   Educator  LB   Instruction Review Code  2- meets goals/outcomes      Respiratory Medications: - Group verbal and written instruction to review medications for lung disease. Drug class, frequency, complications, importance of spacers, rinsing mouth after steroid MDI's, and proper cleaning methods for nebulizers.      Pulmonary Rehab from 01/22/2015 in [place]   Date  12/22/14   Educator  [place]   Instruction Review Code  2- meets goals/outcomes [Spacer and instructions given to pt. ]      AED/CPR: - Group verbal and written instruction with the use of models to demonstrate the basic use of the AED with the basic ABC's of resuscitation.      Pulmonary Rehab from 01/22/2015 in [place]   Date  01/19/15   Educator  CE   Instruction Review Code  2- meets goals/outcomes      Breathing Retraining: - Provides individuals verbal and written instruction on purpose, frequency, and proper technique of diaphragmatic breathing and pursed-lipped breathing. Applies individual practice skills.      Pulmonary Rehab from 01/22/2015 in  [place]   Date  12/05/14   Educator  LB   Instruction Review Code  2- meets goals/outcomes      Anatomy and Physiology of the Lungs: - Group verbal and written instruction with the use of models to provide basic lung anatomy and physiology related to function, structure and complications of lung disease.   Heart Failure: - Group verbal and written instruction on the basics of heart failure: signs/symptoms, treatments, explanation of ejection fraction, enlarged heart and cardiomyopathy.      Pulmonary Rehab from 01/22/2015 in [place]   Date  12/29/14   Educator  [place]   Instruction Review Code  2- meets goals/outcomes      Sleep Apnea: - Individual verbal and written instruction to review Obstructive Sleep Apnea. Review of risk factors, methods for diagnosing and types of masks and machines for OSA.   Anxiety: - Provides group, verbal and written instruction on the correlation between heart/lung disease  and anxiety, treatment options, and management of anxiety.      Pulmonary Rehab from 01/22/2015 in [place]   Date  01/03/15   Educator  [facility]   Instruction Review Code  2- Meets goals/outcomes      Relaxation: - Provides group, verbal and written instruction about the benefits of relaxation for patients with heart/lung disease. Also provides patients with examples of relaxation techniques.   Knowledge Questionnaire Score:     Knowledge Questionnaire Score - 12/05/14 0920    Knowledge Questionnaire Score   Pre Score -2      Personal Goals and Risk Factors at Admission:     Personal Goals and Risk Factors at Admission - 12/05/14 1039    Personal Goals and Risk Factors on Admission    Weight Management No   Increase Aerobic Exercise and Physical Activity Yes   Intervention While in program, learn and follow the exercise prescription taught. Start at a low  level workload and increase workload after able to maintain previous level for 30 minutes. Increase time before increasing intensity.  Ms Keener was a member of [facility] and plans to return there after LW.   Quit Smoking No   Take Less Medication No   Understand more about Heart/Pulmonary Disease. Yes   Intervention While in program utilize professionals for any questions, and attend the education sessions. Great websites to use are www.americanheart.org or www.lung.org for reliable information.  Ms Zuver is very motivated to learn new information on medical issues. She is planning to start a Pulmonary Support Group.   Improve shortness of breath with ADL's Yes   Intervention While in program, learn and follow the exercise prescription taught. Start at a low level workload and increase workload ad advised by the exercise physiologist. Increase time before increasing intensity.  Her UCSD SOB Questionnaire was scored 15.  She knows to pace herself with activities.   Develop more efficient breathing techniques such as purse lipped breathing and diaphragmatic breathing; and practicing self-pacing with activity Yes   Intervention While in program, learn and utilize the specific breathing techniques taught to you. Continue to practice and use the techniques as needed.  Ms Gills does perform PLB, but does forget to do it at times.   Increase knowledge of respiratory medications and ability to use respiratory devices properly.  Yes   Intervention While in program learn and demonstrate appropriate use of your oxygen therapy by increasing flow with exertion, manage oxygen tank operation, including continuous and intermittent flow.  Understanding oxygen is a drug ordered by your physician.;While in program, learn to administer MDI, nebulizer, and spacer properly.;Learn to take respiratory medicine as ordered.;While in program, learn to Clean MDI, nebulizers, and spacers properly.  She has a good  understanding of her Advair, Proventil, and spacer. She uses 2-4l/m of oxygen - adjusting the flows as needed; she has 2 portable tanks - one a concentrator and the other  [place].      Personal Goals and Risk Factors Review:      Goals and Risk Factor Review      12/18/14 1130 12/20/14 1130 12/27/14 1130 12/27/14 1513 01/08/15 1130   Increase Aerobic Exercise and Physical Activity   Goals Progress/Improvement seen      Yes   Comments     Ms Schmale states she has noticed an improvement in her stamina and endurance with her move into her new apartment. She is tired, but she has  accomplished more physically than she expected due to [place].   Understand more about Heart/Pulmonary Disease   Goals Progress/Improvement seen  Yes   Yes    Comments Ms Ting is planning to start a Pulmonary Fibrosis Support Group in the [place], [place] area. She has support from the IPF Association and good educational material.   Ms Narain shared information with me about a germ free vaporizer for use at night, and information on a humidifier monitor which I can in turn share in my Trigger education session.    Improve shortness of breath with ADL's   Goals Progress/Improvement seen  Yes       Comments Ms Moyano states she already feels a difference with her breathing even after 5 sessions.       Breathing Techniques   Goals Progress/Improvement seen   Yes   Yes   Comments  Ms Vora uses PLB during her exercise goals and during activity at home.   Ms Kemnitz uses PLB with her exercise goals and activity at home. She also paces herself.   Increase knowledge of respiratory medications   Goals Progress/Improvement seen  Yes  Yes  Yes   Comments Increased Ms Pae oxygen to 6l/m on the XR - discussed the importance of keeping her O2Sat's in the 90's during activity.  Reviewed technique for Albuterol MDI with spacer - good understanding and demonstration  Ms Brandner is adjusting her oxygen  independently with activites and use of her oximeter.     01/19/15 1418           Improve shortness of breath with ADL's   Goals Progress/Improvement seen  Yes       Comments Ms. Lariccia states that she is feeling much stronger.  She is excited about setteling into her new appartment and using the gym at her complex.  She feels like she can do more now and has less SOB.          Personal Goals Discharge:    Comments: Notification of 30 day evaluation in [name]; no response necessary.

## 2015-01-22 NOTE — Progress Notes (Signed)
Daily Session Note  Patient Details  Name: Erika Martinez MRN: [id number] Date of Birth: 1955/11/20 Referring Provider:  Juanito Doom, MD  Encounter Date: 01/22/2015  Check In:     Session Check In - 01/22/15 1151    Check-In   Staff Present Laureen Owens Shark BS, RRT, Respiratory Therapist;Ulus Hazen RN, BSN;Kelly Hayes BS, ACSM CEP Exercise Physiologist   Medication changes reported     No   Fall or balance concerns reported    No   Warm-up and Cool-down Performed on first and last piece of equipment   VAD Patient? No   Pain Assessment   Currently in Pain? No/denies           Exercise Prescription Changes - 01/22/15 1100    Exercise Review   Progression Yes   Response to Exercise   Blood Pressure (Admit) 110/68 mmHg   Blood Pressure (Exercise) 126/74 mmHg   Blood Pressure (Exit) 104/66 mmHg   Heart Rate (Admit) 61 bpm   Heart Rate (Exercise) 110 bpm   Heart Rate (Exit) 88 bpm   Oxygen Saturation (Admit) 100 %  4l/m   Oxygen Saturation (Exercise) 96 %  6l/m   Oxygen Saturation (Exit) 100 %   Rating of Perceived Exertion (Exercise) 12   Perceived Dyspnea (Exercise) 2   Resistance Training   Training Prescription Yes   Weight 2   Reps 10-12   Treadmill   MPH 1.7   Grade 0   Minutes 15   NuStep   Level 3   Watts 50   Minutes 15   REL-XR   Level 3   Watts 50   Minutes 15      Goals Met:  Proper associated with RPD/PD & O2 Sat Exercise tolerated well  Goals Unmet:  Not Applicable  Goals Comments: Does well on the treadmill.    Dr. Emily Filbert is Medical Director for [place] and [name].

## 2015-01-24 DIAGNOSIS — J8410 Pulmonary fibrosis, unspecified: Secondary | ICD-10-CM

## 2015-01-24 NOTE — Progress Notes (Signed)
Daily Session Note  Patient Details  Name: COTINA FREEDMAN MRN: [id number] Date of Birth: [birth year] Referring Provider:  Juanito Doom, MD  Encounter Date: 01/24/2015  Check In:     Session Check In - 01/24/15 1253    Check-In   Staff Present Lestine Box BS, ACSM EP-C, Exercise Physiologist;Laureen Janell Quiet, RRT, Respiratory Therapist   ER physicians immediately available to respond to emergencies [name] immediately available ER MD   Physician(s) gayle and schaevit   Medication changes reported     No   Fall or balance concerns reported    No   Warm-up and Cool-down Performed on first and last piece of equipment   VAD Patient? No   Pain Assessment   Currently in Pain? No/denies         Goals Met:  Proper associated with RPD/PD & O2 Sat Exercise tolerated well No report of cardiac concerns or symptoms Strength training completed today  Goals Unmet:  Not Applicable  Goals Comments: Ms Cocuzza did too much at home this morning and would rather do her mid 70md on Friday.   Dr. MEmily Filbertis Medical Director [facility] [name].

## 2015-01-25 ENCOUNTER — Other Ambulatory Visit: Payer: Self-pay | Admitting: Family Medicine

## 2015-01-26 ENCOUNTER — Encounter: Payer: 59 | Admitting: *Deleted

## 2015-01-26 DIAGNOSIS — J8410 Pulmonary fibrosis, unspecified: Secondary | ICD-10-CM

## 2015-01-26 NOTE — Progress Notes (Signed)
Daily Session Note  Patient Details  Name: Erika Martinez MRN: [id number] Date of Birth: June 10, 1955 Referring Provider:  Juanito Doom, MD  Encounter Date: 01/26/2015  Check In:     Session Check In - 01/26/15 1203    Check-In   Staff Present Gerlene Burdock RN, BSN;Stacey Blanch Media RRT, RCP Respiratory Therapist  Hessie Knows   ER physicians immediately available to respond to emergencies [name] immediately available ER MD   Physician(s) Dr. Jacqualine Code and Dr. Karma Greaser   Medication changes reported     No   Fall or balance concerns reported    No   Warm-up and Cool-down Performed on first and last piece of equipment   VAD Patient? No   Pain Assessment   Currently in Pain? No/denies         Goals Met:  Proper associated with RPD/PD & O2 Sat Independence with exercise equipment Using PLB without cueing & demonstrates good technique Exercise tolerated well Strength training completed today  Goals Unmet:  Not Applicable  Goals Comments: Doing well exercising  on REL XR6000.   Dr. Emily Filbert is Medical Director for [place] and [name].

## 2015-01-29 ENCOUNTER — Encounter: Payer: 59 | Admitting: *Deleted

## 2015-01-29 DIAGNOSIS — J8410 Pulmonary fibrosis, unspecified: Secondary | ICD-10-CM | POA: Diagnosis not present

## 2015-01-29 NOTE — Progress Notes (Signed)
Daily Session Note  Patient Details  Name: Erika Martinez MRN: [id number] Date of Birth: Apr 14, 1956 Referring Provider:  Juanito Doom, MD  Encounter Date: 01/29/2015  Check In:     Session Check In - 01/29/15 1143    Check-In   Staff Present Carson Myrtle BS, RRT, Respiratory Therapist;Ronrico Dupin RN, BSN;Steven Way BS, ACSM EP-C, Exercise Physiologist   ER physicians immediately available to respond to emergencies [name] immediately available ER MD   Physician(s) Dr. Marcelene Butte and Dr. Thomasene Lot    Medication changes reported     No   Fall or balance concerns reported    No   Warm-up and Cool-down Performed on first and last piece of equipment   VAD Patient? No   Pain Assessment   Currently in Pain? No/denies         Goals Met:  Proper associated with RPD/PD & O2 Sat Exercise tolerated well  Goals Unmet:  Not Applicable  Goals Comments: On XR 6000 will increase her level to level 4 and 55 watts.    Dr. Emily Filbert is Medical Director for [place] and [name].

## 2015-01-31 ENCOUNTER — Encounter: Payer: 59 | Admitting: *Deleted

## 2015-01-31 DIAGNOSIS — J8410 Pulmonary fibrosis, unspecified: Secondary | ICD-10-CM | POA: Diagnosis not present

## 2015-01-31 NOTE — Progress Notes (Signed)
Daily Session Note  Patient Details  Name: Erika Martinez MRN: [id number] Date of Birth: Jan 05, 1956 Referring Provider:  Steele Sizer, MD  Encounter Date: 01/31/2015  Check In:     Session Check In - 01/31/15 1145    Check-In   Staff Present Laureen Owens Shark BS, RRT, Respiratory Therapist;Yonatan Guitron Dillard Essex MS, ACSM CEP Exercise Physiologist;Steven Way BS, ACSM EP-C, Exercise Physiologist   Medication changes reported     No   Fall or balance concerns reported    No   Warm-up and Cool-down Performed on first and last piece of equipment   VAD Patient? No   Pain Assessment   Currently in Pain? No/denies   Multiple Pain Sites No         Goals Met:  Proper associated with RPD/PD & O2 Sat Independence with exercise equipment Using PLB without cueing & demonstrates good technique Exercise tolerated well Strength training completed today  Goals Unmet:  Not Applicable  Goals Comments:   Dr. Emily Filbert is Medical Director for [place] and [name].

## 2015-02-02 ENCOUNTER — Ambulatory Visit (INDEPENDENT_AMBULATORY_CARE_PROVIDER_SITE_OTHER): Payer: 59 | Admitting: Family Medicine

## 2015-02-02 ENCOUNTER — Encounter: Payer: Self-pay | Admitting: Family Medicine

## 2015-02-02 VITALS — BP 118/70 | HR 107 | Temp 98.3°F | Resp 17 | Ht 60.0 in | Wt 127.7 lb

## 2015-02-02 DIAGNOSIS — F419 Anxiety disorder, unspecified: Secondary | ICD-10-CM | POA: Diagnosis not present

## 2015-02-02 DIAGNOSIS — Z9981 Dependence on supplemental oxygen: Secondary | ICD-10-CM | POA: Insufficient documentation

## 2015-02-02 DIAGNOSIS — G4700 Insomnia, unspecified: Secondary | ICD-10-CM | POA: Diagnosis not present

## 2015-02-02 DIAGNOSIS — E538 Deficiency of other specified B group vitamins: Secondary | ICD-10-CM | POA: Insufficient documentation

## 2015-02-02 DIAGNOSIS — M4310 Spondylolisthesis, site unspecified: Secondary | ICD-10-CM | POA: Insufficient documentation

## 2015-02-02 DIAGNOSIS — J3089 Other allergic rhinitis: Secondary | ICD-10-CM

## 2015-02-02 DIAGNOSIS — K219 Gastro-esophageal reflux disease without esophagitis: Secondary | ICD-10-CM | POA: Diagnosis not present

## 2015-02-02 DIAGNOSIS — Z8619 Personal history of other infectious and parasitic diseases: Secondary | ICD-10-CM | POA: Insufficient documentation

## 2015-02-02 DIAGNOSIS — E559 Vitamin D deficiency, unspecified: Secondary | ICD-10-CM | POA: Insufficient documentation

## 2015-02-02 DIAGNOSIS — J302 Other seasonal allergic rhinitis: Secondary | ICD-10-CM | POA: Insufficient documentation

## 2015-02-02 DIAGNOSIS — K589 Irritable bowel syndrome without diarrhea: Secondary | ICD-10-CM | POA: Insufficient documentation

## 2015-02-02 DIAGNOSIS — M349 Systemic sclerosis, unspecified: Secondary | ICD-10-CM | POA: Insufficient documentation

## 2015-02-02 DIAGNOSIS — I471 Supraventricular tachycardia: Secondary | ICD-10-CM | POA: Insufficient documentation

## 2015-02-02 MED ORDER — ALPRAZOLAM 0.5 MG PO TABS: 0.5000 mg | ORAL_TABLET | Freq: Three times a day (TID) | ORAL | Status: DC | PRN

## 2015-02-02 MED ORDER — DILTIAZEM HCL 60 MG PO TABS: 60.0000 mg | ORAL_TABLET | Freq: Every day | ORAL | Status: DC

## 2015-02-02 MED ORDER — OMEPRAZOLE 40 MG PO CPDR: 40.0000 mg | DELAYED_RELEASE_CAPSULE | Freq: Every day | ORAL | Status: DC

## 2015-02-02 NOTE — Progress Notes (Signed)
Name: Erika Martinez   MRN: [id number]    DOB: [birth year]   Date:02/02/2015       Progress Note  Subjective  Chief Complaint  Chief Complaint  Patient presents with  . 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 Going back to work in one week , works in [employer] and explained the importance of flu vaccine, she will return in about 6 weeks when to have a Fluzone - when prednisone dose will go down.   AR: doing well at this time  Anxiety: symptoms worse when on prednisone and when she goes back to work, she got 30 day prescription but would like to have 45 per month.   GERD: a little worse since resumed dose of prednisone, having symptoms with prednisone 20mg    Patient Active Problem List   Diagnosis Date Noted  . Chronic hepatitis C 02/02/2015  . Insomnia 02/02/2015  . Gastro-esophageal reflux disease without esophagitis 02/02/2015  . IBS (irritable bowel syndrome) 02/02/2015  . Hypoxemia 02/02/2015  . Low serum cobalamin 02/02/2015  . Dependence on supplemental oxygen 02/02/2015  . Perennial allergic rhinitis with seasonal variation 02/02/2015  . Scleroderma 02/02/2015  . SPL (spondylolisthesis) 02/02/2015  . Supraventricular tachycardia 02/02/2015  . Vitamin D deficiency 02/02/2015  . Anxiety 02/02/2015  . Hepatitis C antibody test positive 06/14/2014  . UIP (usual interstitial pneumonitis) 09/13/2013  . History of pneumococcal pneumonia 09/13/2013  . Chronic hypoxemic respiratory failure 09/13/2013  . Polycythemia, secondary 09/13/2013    Past Surgical History  Procedure Laterality Date  . Vaginal hysterectomy    . Abdominal hysterectomy      Family History  Problem Relation Age of Onset  . Lung cancer Father   .  Cancer Father   . Alzheimer's disease Mother     Social History   Social History  . Marital Status: Divorced    Spouse Name: N/A  . Number of Children: N/A  . Years of Education: N/A   Occupational History  . Not on file.   Social History Main Topics  . Smoking status: Never Smoker   . Smokeless tobacco: Never Used  . Alcohol Use: 2.0 oz/week    4 Standard drinks or equivalent per week     Comment: "social drinker"  . Drug Use: No  . Sexual Activity: Not on file   Other Topics Concern  . Not on file   Social History Narrative     Current outpatient prescriptions:  .  albuterol (PROVENTIL HFA;VENTOLIN HFA) 108 (90 BASE) MCG/ACT inhaler, Inhale 2 puffs into the lungs every 4 (four) hours as needed for wheezing or shortness of breath., Disp: 8 g, Rfl: 5 .  ALPRAZolam (XANAX) 0.5 MG tablet, Take 1 tablet (0.5 mg total) by mouth 3 (three) times daily as needed for anxiety., Disp: 45 tablet, Rfl: 2 .  Azelastine-Fluticasone (DYMISTA) 137-50 MCG/ACT SUSP, Place into the nose., Disp: , Rfl:  .  cholecalciferol (VITAMIN D) 1000 UNITS tablet, Take 2,000 Units by mouth daily., Disp: , Rfl:  .  diltiazem (CARDIZEM) 60 MG tablet, Take 1 tablet (60 mg total) by mouth daily., Disp: 30 tablet, Rfl: 2 .  Fluticasone-Salmeterol (ADVAIR DISKUS) 500-50 MCG/DOSE AEPB, Inhale 1 puff into the lungs 2 (two) times daily., Disp: 60 each,  Rfl: 5 .  Fluticasone-Salmeterol (ADVAIR DISKUS) 500-50 MCG/DOSE AEPB, Inhale into the lungs., Disp: , Rfl:  .  loratadine (CLARITIN) 10 MG tablet, TAKE 1 TABLET DAILY, Disp: 30 tablet, Rfl: 3 .  nystatin (MYCOSTATIN) 100000 UNIT/ML suspension, TAKE 1 TABLESPOON BY MOUTH BEFORE MEALS AND AT BEDTIME (SWISH AND SPIT OUT), Disp: , Rfl: 2 .  predniSONE (DELTASONE) 20 MG tablet, Take 1 tablet (20 mg total) by mouth daily., Disp: 30 tablet, Rfl: 3 .  vitamin B-12 (CYANOCOBALAMIN) 250 MCG tablet, Place 250 mcg under the tongue daily., Disp: , Rfl:  .  omeprazole (PRILOSEC)  40 MG capsule, Take 1 capsule (40 mg total) by mouth daily., Disp: 30 capsule, Rfl: 3  No Known Allergies   ROS  Constitutional: Negative for fever or weight change.  Respiratory: Occasional  cough positive for  shortness of breath.   Cardiovascular: Negative for chest pain or palpitations.  Gastrointestinal: Negative for abdominal pain, no bowel changes.  Musculoskeletal: Negative for gait problem or joint swelling.  Skin: Negative for rash.  Neurological: Negative for dizziness or headache.  No other specific complaints in a complete review of systems (except as listed in HPI above).   Objective  Filed Vitals:   02/02/15 1515  BP: 118/70  Pulse: 107  Temp: 98.3 F (36.8 C)  TempSrc: Oral  Resp: 17  Height: 5' (1.524 m)  Weight: 127 lb 11.2 oz (57.924 kg)  SpO2: 97%    Body mass index is 24.94 kg/(m^2).  Physical Exam  Constitutional: Patient appears well-developed and well-nourished.  No distress.  HEENT: head atraumatic, normocephalic, pupils equal and reactive to light, , neck supple, throat within normal limits Cardiovascular: Normal rate, regular rhythm and normal heart sounds.  No murmur heard. No BLE edema. Pulmonary/Chest: Effort normal and breath sounds normal. On nasal canula oxygen Abdominal: Soft.  There is no tenderness. Psychiatric: Patient has a normal mood and affect. behavior is normal. Judgment and thought content normal.  Recent Results (from the past 2160 hour(s))  [facility])     Status: None   Collection Time: 12/27/14  3:28 PM  Result Value Ref Range   HCT 43.7 35.0 - 47.0 %     PHQ2/9: Depression screen [facility] 2/9 02/02/2015 12/05/2014  Decreased Interest 0 1  Down, Depressed, Hopeless 0 0  PHQ - 2 Score 0 1     Fall Risk: Fall Risk  02/02/2015 12/05/2014  Falls in the past year? No No      Assessment & Plan  1. Anxiety Increase amount of alprazolam to 45 pills month  2. Insomnia A little worse with prednisone  3.  Dependence on supplemental oxygen Continue current dose and follow up with Dr. Lake Bells  4. Gastro-esophageal reflux disease without esophagitis Symptoms get worse on prednisone, increase dose - omeprazole (PRILOSEC) 40 MG capsule; Take 1 capsule (40 mg total) by mouth daily.  Dispense: 30 capsule; Refill: 3  5. Supraventricular tachycardia  - diltiazem (CARDIZEM) 60 MG tablet; Take 1 tablet (60 mg total) by mouth daily.  Dispense: 30 tablet; Refill: 2

## 2015-02-05 ENCOUNTER — Encounter: Payer: 59 | Admitting: *Deleted

## 2015-02-05 ENCOUNTER — Ambulatory Visit: Payer: Self-pay | Admitting: Family Medicine

## 2015-02-05 DIAGNOSIS — J8410 Pulmonary fibrosis, unspecified: Secondary | ICD-10-CM

## 2015-02-05 NOTE — Progress Notes (Signed)
Daily Session Note  Patient Details  Name: Erika Martinez MRN: [id number] Date of Birth: 04/12/1956 Referring Provider:  Juanito Doom, MD  Encounter Date: 02/05/2015  Check In:     Session Check In - 02/05/15 1221    Check-In   Staff Present Carson Myrtle BS, RRT, Respiratory Therapist;Amir Glaus RN, BSN;Steven Way BS, ACSM EP-C, Exercise Physiologist   ER physicians immediately available to respond to emergencies [name] immediately available ER MD   Physician(s) Dr. Joni Fears, Dr. Junita Push   Medication changes reported     No   Fall or balance concerns reported    No   Warm-up and Cool-down Performed on first and last piece of equipment   VAD Patient? No   Pain Assessment   Currently in Pain? 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 Nicholas is already on oxygen for her PUlmonary Fibrosis.    Dr. Emily Filbert is Medical Director for [place] and [name].

## 2015-02-07 ENCOUNTER — Encounter: Payer: 59 | Admitting: *Deleted

## 2015-02-07 VITALS — BP 128/80 | HR 96 | Wt 127.0 lb

## 2015-02-07 DIAGNOSIS — J8410 Pulmonary fibrosis, unspecified: Secondary | ICD-10-CM

## 2015-02-07 NOTE — Progress Notes (Signed)
Daily Session Note  Patient Details  Name: Erika Martinez MRN: [id number] Date of Birth: [birth year] Referring Provider:  Juanito Doom, MD  Encounter Date: 02/07/2015  Check In:     Session Check In - 02/07/15 1222    Check-In   Staff Present Laureen Owens Shark BS, RRT, Respiratory Therapist;Shaylan Tutton Dillard Essex MS, ACSM CEP Exercise Physiologist;Steven Way BS, ACSM EP-C, Exercise Physiologist   ER physicians immediately available to respond to emergencies [name] immediately available ER MD   Physician(s) Cinda Quest and Quale   Medication changes reported     No   Fall or balance concerns reported    No   Warm-up and Cool-down Performed on first and last piece of equipment   VAD Patient? No   Pain Assessment   Currently in Pain? No/denies   Multiple Pain Sites No         Goals Met:  Proper associated with RPD/PD & O2 Sat Independence with exercise equipment Improved SOB with ADL's Using PLB without cueing & demonstrates good technique Exercise tolerated well Personal goals reviewed Strength training completed today  Goals Unmet:  Not Applicable  Goals Comments: Reviewed goals with Xayla. She is very happy with her progress in the program.                                   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 She will check on her schedule and determine how many more sessions she can complete. She is also checking on the exercise  equipment at her apartment for future exercise after LW.   Dr. Emily Filbert is Medical Director for [place] and [name].

## 2015-02-09 ENCOUNTER — Encounter: Payer: 59 | Attending: Pulmonary Disease

## 2015-02-09 DIAGNOSIS — J8410 Pulmonary fibrosis, unspecified: Secondary | ICD-10-CM | POA: Insufficient documentation

## 2015-02-19 ENCOUNTER — Telehealth: Payer: Self-pay | Admitting: Pulmonary Disease

## 2015-02-19 ENCOUNTER — Other Ambulatory Visit: Payer: Self-pay | Admitting: *Deleted

## 2015-02-19 ENCOUNTER — Encounter: Payer: 59 | Admitting: Respiratory Therapy

## 2015-02-19 DIAGNOSIS — J8410 Pulmonary fibrosis, unspecified: Secondary | ICD-10-CM

## 2015-02-19 NOTE — Telephone Encounter (Signed)
OK to get flu shot

## 2015-02-19 NOTE — Telephone Encounter (Signed)
lmtcb x1 for pt. 

## 2015-02-19 NOTE — Telephone Encounter (Signed)
Called pt. Her PCP Dr. Ancil Boozer wants pt to get clearance from Dr. Lake Bells stating it is okay for her to geet the flu shot since she takes pred 20 mg daily. PCP was hesitant to give flu shot. Please advise Dr. Lake Bells thanks

## 2015-02-19 NOTE — Telephone Encounter (Signed)
Called spoke with pt. She has an oxygo4 and now [employer] has an Museum/gallery exhibitions officer. She reports we ordered this back in July and she never received it.  I called [employer] and spoke with Daneil Dan. They have ordered this for pt from the warehouse and once it comes in they will call back.  Called made pt aware. She verbalized understanding and needed nothing further.

## 2015-02-19 NOTE — Telephone Encounter (Signed)
Patient notified.  Nothing further needed. 

## 2015-02-19 NOTE — Progress Notes (Signed)
Pulmonary Individual Treatment Plan  Patient Details  Name: Erika Martinez MRN: [id number] Date of Birth: Dec 19, 1955 Referring Provider:  Juanito Doom, MD  Initial Encounter Date:  12/05/2014  Visit Diagnosis: No diagnosis found.  Patient's Home Medications on Admission:  Current outpatient prescriptions:  .  albuterol (PROVENTIL HFA;VENTOLIN HFA) 108 (90 BASE) MCG/ACT inhaler, Inhale 2 puffs into the lungs every 4 (four) hours as needed for wheezing or shortness of breath., Disp: 8 g, Rfl: 5 .  ALPRAZolam (XANAX) 0.5 MG tablet, Take 1 tablet (0.5 mg total) by mouth 3 (three) times daily as needed for anxiety., Disp: 45 tablet, Rfl: 2 .  Azelastine-Fluticasone (DYMISTA) 137-50 MCG/ACT SUSP, Place into the nose., Disp: , Rfl:  .  cholecalciferol (VITAMIN D) 1000 UNITS tablet, Take 2,000 Units by mouth daily., Disp: , Rfl:  .  diltiazem (CARDIZEM) 60 MG tablet, Take 1 tablet (60 mg total) by mouth daily., Disp: 30 tablet, Rfl: 2 .  Fluticasone-Salmeterol (ADVAIR DISKUS) 500-50 MCG/DOSE AEPB, Inhale 1 puff into the lungs 2 (two) times daily., Disp: 60 each, Rfl: 5 .  Fluticasone-Salmeterol (ADVAIR DISKUS) 500-50 MCG/DOSE AEPB, Inhale into the lungs., Disp: , Rfl:  .  loratadine (CLARITIN) 10 MG tablet, TAKE 1 TABLET DAILY, Disp: 30 tablet, Rfl: 3 .  nystatin (MYCOSTATIN) 100000 UNIT/ML suspension, TAKE 1 TABLESPOON BY MOUTH BEFORE MEALS AND AT BEDTIME (SWISH AND SPIT OUT), Disp: , Rfl: 2 .  omeprazole (PRILOSEC) 40 MG capsule, Take 1 capsule (40 mg total) by mouth daily., Disp: 30 capsule, Rfl: 3 .  predniSONE (DELTASONE) 20 MG tablet, Take 1 tablet (20 mg total) by mouth daily., Disp: 30 tablet, Rfl: 3 .  vitamin B-12 (CYANOCOBALAMIN) 250 MCG tablet, Place 250 mcg under the tongue daily., Disp: , Rfl:   Past Medical History: Past Medical History  Diagnosis Date  . Asthma   . Mitral valve prolapse   . Seasonal allergies   . Hypertension   . Abnormal hemoglobin   . Plantar wart    . Symptomatic menopausal or female climacteric states   . Scleroderma     Dr. Pennie Banter  . Pulmonary fibrosis   . Allergy   . Oxygen dependent   . Erythrocytosis   . Hep C w/o coma, chronic   . Lumbago   . Anxiety   . GERD (gastroesophageal reflux disease)   . IBS (irritable bowel syndrome)   . Controlled insomnia   . Spondylolisthesis     Tobacco Use: History  Smoking status  . Never Smoker   Smokeless tobacco  . Never Used    Labs: Recent Review Flowsheet Data    Labs for ITP Cardiac and Pulmonary Rehab Latest Ref Rng 07/14/2013   Cholestrol 0 - 200 mg/dL 169   LDLCALC - 95   HDL 35 - 70 mg/dL 58   Trlycerides 40 - 160 mg/dL 78       ADL UCSD:     ADL UCSD      12/05/14 0920 01/29/15 1130     ADL UCSD   ADL Phase Entry Mid    SOB Score total 15 19    Rest 0 0    Walk 0 1    Stairs 1 3    Bath 0 1    Dress 0 0    Shop 1 1        Pulmonary Function Assessment:     Pulmonary Function Assessment - 12/05/14 0920    Pulmonary Function Tests   FVC%  48 %   FEV1% 50 %   FEV1/FVC Ratio 63   DLCO% 54 %   Breath   Bilateral Breath Sounds Basilar;Rales   Shortness of Breath Yes      Exercise Target Goals:    Exercise Program Goal: Individual exercise prescription set with THRR, safety & activity barriers. Participant demonstrates ability to understand and report RPE using BORG scale, to self-measure pulse accurately, and to acknowledge the importance of the exercise prescription.  Exercise Prescription Goal: Starting with aerobic activity 30 plus minutes a day, 3 days per week for initial exercise prescription. Provide home exercise prescription and guidelines that participant acknowledges understanding prior to discharge.  Activity Barriers & Risk Stratification:     Activity Barriers & Risk Stratification - 12/05/14 0920    Activity Barriers & Risk Stratification   Activity Barriers None      6 Minute Walk:     6 Minute Walk       12/05/14 1341 01/29/15 1240     6 Minute Walk   Phase Initial Mid Program    Distance 830 feet 950 feet    Walk Time 6 minutes 6 minutes    Resting HR 72 bpm 94 bpm    Resting BP 132/72 mmHg 110/62 mmHg    Max Ex. HR 98 bpm 121 bpm    Max Ex. BP 146/78 mmHg 178/78 mmHg    RPE 15 16    Perceived Dyspnea  4 4    Symptoms No No       Initial Exercise Prescription:     Initial Exercise Prescription - 12/08/14 1100    Treadmill   MPH 1.5   Grade 0   Minutes 10   NuStep   Level 2   Watts 40   Minutes 15   REL-XR   Level 2   Watts 40   Minutes 10   Resistance Training   Training Prescription Yes   Weight 2   Reps 10-15      Exercise Prescription Changes:     Exercise Prescription Changes      12/19/14 0800 12/27/14 1200 01/01/15 1200 01/08/15 1200 01/17/15 1500   Exercise Review   Progression   Yes Yes Yes   Response to Exercise   Blood Pressure (Admit) --  Entry for 12/18/2014    110/68 mmHg   Blood Pressure (Exercise)     126/74 mmHg   Blood Pressure (Exit)     104/66 mmHg   Heart Rate (Admit)     61 bpm   Heart Rate (Exercise)     110 bpm   Heart Rate (Exit)     88 bpm   Oxygen Saturation (Admit)     100 %  4l/m   Oxygen Saturation (Exercise)     96 %  6l/m   Oxygen Saturation (Exit)     100 %   Rating of Perceived Exertion (Exercise)     12   Perceived Dyspnea (Exercise)     2   Resistance Training   Training Prescription     Yes   Weight     2   Reps     10-12   Treadmill   MPH    1.5 1.5   Grade    0 0   Minutes    15 15   NuStep   Level  '3 3 3 3   ' Watts  50 50 50 50   Minutes  '15 15 15 ' 15  REL-XR   Level     3   Watts     50   Minutes   '15 15 15     ' 01/22/15 1100 01/29/15 1100         Exercise Review   Progression Yes Yes      Response to Exercise   Blood Pressure (Admit) 110/68 mmHg 110/68 mmHg      Blood Pressure (Exercise) 126/74 mmHg 126/74 mmHg      Blood Pressure (Exit) 104/66 mmHg 104/66 mmHg      Heart Rate (Admit) 61 bpm  61 bpm      Heart Rate (Exercise) 110 bpm 110 bpm      Heart Rate (Exit) 88 bpm 88 bpm      Oxygen Saturation (Admit) 100 %  4l/m 100 %  4l/m      Oxygen Saturation (Exercise) 96 %  6l/m 96 %  6l/m      Oxygen Saturation (Exit) 100 % 100 %      Rating of Perceived Exertion (Exercise) 12 12      Perceived Dyspnea (Exercise) 2 2      Resistance Training   Training Prescription Yes Yes      Weight 2 2      Reps 10-12 10-12      Treadmill   MPH 1.7 1.7      Grade 0 0      Minutes 15 15      NuStep   Level 3 3      Watts 50 50      Minutes 15 15      REL-XR   Level 3 4      Watts 50 55      Minutes 15 15         Discharge Exercise Prescription (Final Exercise Prescription Changes):     Exercise Prescription Changes - 01/29/15 1100    Exercise Review   Progression Yes   Response to Exercise   Blood Pressure (Admit) 110/68 mmHg   Blood Pressure (Exercise) 126/74 mmHg   Blood Pressure (Exit) 104/66 mmHg   Heart Rate (Admit) 61 bpm   Heart Rate (Exercise) 110 bpm   Heart Rate (Exit) 88 bpm   Oxygen Saturation (Admit) 100 %  4l/m   Oxygen Saturation (Exercise) 96 %  6l/m   Oxygen Saturation (Exit) 100 %   Rating of Perceived Exertion (Exercise) 12   Perceived Dyspnea (Exercise) 2   Resistance Training   Training Prescription Yes   Weight 2   Reps 10-12   Treadmill   MPH 1.7   Grade 0   Minutes 15   NuStep   Level 3   Watts 50   Minutes 15   REL-XR   Level 4   Watts 55   Minutes 15       Nutrition:  Target Goals: Understanding of nutrition guidelines, daily intake of sodium <1595m, cholesterol <2067m calories 30% from fat and 7% or less from saturated fats, daily to have 5 or more servings of fruits and vegetables.  Biometrics:    Nutrition Therapy Plan and Nutrition Goals:     Nutrition Therapy & Goals - 12/05/14 0920    Nutrition Therapy   Diet Erika Martinez not to meet with the dietitian; she manages her diet well with small portion  sizes, limits sweets; drinks 6 glasses of water/day and 2 ginger ale soda/day; no caffeine.      Nutrition Discharge: Rate Your Plate  Scores:   Psychosocial: Target Goals: Acknowledge presence or absence of depression, maximize coping skills, provide positive support system. Participant is able to verbalize types and ability to use techniques and skills needed for reducing stress and depression.  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    Comments --  Recently Erika Martinez could not go on a yearly trip to [place] and was very diappointed. Her physician did not want her traveling due to recent treatment..   Family Dynamics   Good Support System? Yes  Erika Martinez has good support from her friends and she is very involved in a IPF Support Group.   Barriers   Psychosocial barriers to participate in program The patient should benefit from training in stress management and relaxation.   Screening Interventions   Interventions Program counselor consult;Encouraged to exercise      Quality of Life Scores:     Quality of Life - 12/05/14 1052    Quality of Life Scores   Health/Function Pre 17.22 %   Socioeconomic Pre 20.71 %   Psych/Spiritual Pre 24 %   Family Pre 22.8 %   GLOBAL Pre 20.07 %      PHQ-9:     Recent Review Flowsheet Data    Depression screen [facility] 2/9 02/02/2015 12/05/2014   Decreased Interest 0 1   Down, Depressed, Hopeless 0 0   PHQ - 2 Score 0 1      Psychosocial Evaluation and Intervention:     Psychosocial Evaluation - 12/20/14 1237    Psychosocial Evaluation &  Interventions   Comments Counselor met with Erika. Martinez today for initial psychosocial evaluation.  She has returned to this program due to shortness of breath.  She reports she stopped exercising since she was working so many hours and "regressed" as a result.  Her mood is reportedly better with no depressive symptoms currently.  She continues to struggle with some anxiety symptoms and takes her medication for this as needed.  Erika Martinez reports she is moving in the near future to a facility that has a 24 hour gym which will help her be more consistent in maintaining her goals once she completes this program.  Counselor will continue to follow with her as needed.     Continued Psychosocial Services Needed Yes  Follow up on mood and anxiety symptoms closer to her job starting back up for the fall.        Psychosocial Re-Evaluation:  Education: Education Goals: Education classes will be provided on a weekly basis, covering required topics. Participant will state understanding/return demonstration of topics presented.  Learning Barriers/Preferences:     Learning Barriers/Preferences - 12/05/14 0920    Learning Barriers/Preferences   Learning Barriers None   Learning Preferences Group Instruction;Individual Instruction;Pictoral;Skilled Demonstration;Verbal Instruction;Video;Written Material      Education Topics: Initial Evaluation Education: - Verbal, written and demonstration of respiratory meds, RPE/PD scales, oximetry and breathing techniques. Instruction on use of nebulizers and MDIs: cleaning and proper use, rinsing mouth with steroid doses and importance of monitoring MDI activations.  Pulmonary Rehab from 01/31/2015 in [place]   Date  12/05/14   Educator  LB   Instruction Review Code  2- meets goals/outcomes      General Nutrition Guidelines/Fats and Fiber: -Group instruction provided by verbal, written material, models and  posters to present the general guidelines for heart healthy nutrition. Gives an explanation and review of dietary fats and fiber.      Pulmonary Rehab from 01/31/2015 in [place]   Date  01/22/15   Educator  C. Joneen Caraway, RD   Instruction Review Code  2- meets goals/outcomes      Controlling Sodium/Reading Food Labels: -Group verbal and written material supporting the discussion of sodium use in heart healthy nutrition. Review and explanation with models, verbal and written materials for utilization of the food label.      Pulmonary Rehab from 01/31/2015 in [place]   Date  01/29/15   Educator  C. Joneen Caraway, RD   Instruction Review Code  2- meets goals/outcomes      Exercise Physiology & Risk Factors: - Group verbal and written instruction with models to review the exercise physiology of the cardiovascular system and associated critical values. Details cardiovascular disease risk factors and the goals associated with each risk factor.   Aerobic Exercise & Resistance Training: - Gives group verbal and written discussion on the health impact of inactivity. On the components of aerobic and resistive training programs and the benefits of this training and how to safely progress through these programs.   Flexibility, Balance, General Exercise Guidelines: - Provides group verbal and written instruction on the benefits of flexibility and balance training programs. Provides general exercise guidelines with specific guidelines to those with heart or lung disease. Demonstration and skill practice provided.   Stress Management: - Provides group verbal and written instruction about the health risks of elevated stress, cause of high stress, and healthy ways to reduce stress.      Pulmonary Rehab from 01/31/2015 in [place]   Date  01/03/15   Educator  [facility]   Instruction Review Code  2-  meets goals/outcomes      Depression: - Provides group verbal and written instruction on the correlation between heart/lung disease and depressed mood, treatment options, and the stigmas associated with seeking treatment.   Exercise & Equipment Safety: - Individual verbal instruction and demonstration of equipment use and safety with use of the equipment.      Pulmonary Rehab from 01/31/2015 in [place]   Date  01/01/15   Educator  L. Owens Shark, RRT   Instruction Review Code  2- meets goals/outcomes      Infection Prevention: - Provides verbal and written material to individual with discussion of infection control including proper hand washing and proper equipment cleaning during exercise session.      Pulmonary Rehab from 01/31/2015 in [place]   Date  12/08/14   Educator  [place]   Instruction Review Code  2- meets goals/outcomes      Falls Prevention: - Provides verbal and written material to individual with discussion of falls prevention and safety.      Pulmonary Rehab from 01/31/2015 in [place]   Date  12/05/14   Educator  LB   Instruction Review Code  2- meets goals/outcomes      Diabetes: - Individual verbal and written  instruction to review signs/symptoms of diabetes, desired ranges of glucose level fasting, after meals and with exercise. Advice that pre and post exercise glucose checks will be done for 3 sessions at entry of program.   Chronic Lung Diseases: - Group verbal and written instruction to review new updates, new respiratory medications, new advancements in procedures and treatments. Provide informative websites and "800" numbers of self-education.   Lung Procedures: - Group verbal and written instruction to describe testing methods done to diagnose lung disease. Review the outcome of test results. Describe the treatment choices: Pulmonary  Function Tests, ABGs and oximetry.      Pulmonary Rehab from 01/31/2015 in [place]   Date  01/12/15   Educator  sj   Instruction Review Code  2- meets goals/outcomes      Energy Conservation: - Provide group verbal and written instruction for methods to conserve energy, plan and organize activities. Instruct on pacing techniques, use of adaptive equipment and posture/positioning to relieve shortness of breath.      Pulmonary Rehab from 01/31/2015 in [place]   Date  01/24/15   Educator  SW   Instruction Review Code  2- meets goals/outcomes      Triggers: - Group verbal and written instruction to review types of environmental controls: home humidity, furnaces, filters, dust mite/pet prevention, HEPA vacuums. To discuss weather changes, air quality and the benefits of nasal washing.      Pulmonary Rehab from 01/31/2015 in [place]   Date  12/18/14   Educator  LB   Instruction Review Code  2- meets goals/outcomes      Exacerbations: - Group verbal and written instruction to provide: warning signs, infection symptoms, calling MD promptly, preventive modes, and value of vaccinations. Review: effective airway clearance, coughing and/or vibration techniques. Create an Sports administrator.   Oxygen: - Individual and group verbal and written instruction on oxygen therapy. Includes supplement oxygen, available portable oxygen systems, continuous and intermittent flow rates, oxygen safety, concentrators, and Medicare reimbursement for oxygen.      Pulmonary Rehab from 01/31/2015 in [place]   Date  12/05/14   Educator  LB   Instruction Review Code  2- meets goals/outcomes      Respiratory Medications: - Group verbal and written instruction to review medications for lung disease. Drug class, frequency, complications, importance of spacers,  rinsing mouth after steroid MDI's, and proper cleaning methods for nebulizers.      Pulmonary Rehab from 01/31/2015 in [place]   Date  12/22/14   Educator  [place]   Instruction Review Code  2- meets goals/outcomes [Spacer and instructions given to pt. ]      AED/CPR: - Group verbal and written instruction with the use of models to demonstrate the basic use of the AED with the basic ABC's of resuscitation.      Pulmonary Rehab from 01/31/2015 in [place]   Date  01/19/15   Educator  CE   Instruction Review Code  2- meets goals/outcomes      Breathing Retraining: - Provides individuals verbal and written instruction on purpose, frequency, and proper technique of diaphragmatic breathing and pursed-lipped breathing. Applies individual practice skills.      Pulmonary Rehab from 01/31/2015 in [place]   Date  12/05/14   Educator  LB   Instruction Review Code  2- meets goals/outcomes      Anatomy and Physiology of the Lungs: - Group verbal and written instruction with the use of models to provide basic lung anatomy and physiology related to function, structure and complications of lung disease.   Heart Failure: - Group verbal and written instruction on the basics of heart failure: signs/symptoms, treatments, explanation of ejection fraction, enlarged heart and cardiomyopathy.      Pulmonary Rehab from 01/31/2015 in [place]   Date  12/29/14   Educator  [place]   Instruction Review Code  2- meets goals/outcomes      Sleep Apnea: - Individual verbal and written instruction to review Obstructive Sleep Apnea. Review of risk factors, methods for diagnosing and types of masks and machines for OSA.   Anxiety: - Provides group, verbal and written instruction on the correlation between heart/lung disease and anxiety, treatment options, and  management of anxiety.      Pulmonary Rehab from 01/31/2015 in [place]   Date  01/31/15   Educator  [facility]   Instruction Review Code  2- Meets goals/outcomes      Relaxation: - Provides group, verbal and written instruction about the benefits of relaxation for patients with heart/lung disease. Also provides patients with examples of relaxation techniques.   Knowledge Questionnaire Score:     Knowledge Questionnaire Score - 12/05/14 0920    Knowledge Questionnaire Score   Pre Score -2      Personal Goals and Risk Factors at Admission:     Personal Goals and Risk Factors at Admission - 12/05/14 1039    Personal Goals and Risk Factors on Admission    Weight Management No   Increase Aerobic Exercise and Physical Activity Yes   Intervention While in program, learn and follow the exercise prescription taught. Start at a low level workload and increase workload after able to maintain previous level for 30 minutes. Increase time before increasing intensity.  Erika Martinez was a member of [facility] and plans to return there after LW.   Quit Smoking No   Take Less Medication No   Understand more about Heart/Pulmonary Disease. Yes   Intervention While in program utilize professionals for any questions, and attend the education sessions. Great websites to use are www.americanheart.org or www.lung.org for reliable information.  Erika Martinez is very motivated to learn new information on medical issues. She is planning to start a Pulmonary Support Group.   Improve shortness of breath with ADL's Yes   Intervention While in program, learn and follow the exercise prescription taught. Start at a low level workload and increase workload ad advised by the exercise physiologist. Increase time before increasing intensity.  Her UCSD SOB Questionnaire was scored 15.  She knows to pace herself with activities.   Develop more efficient breathing techniques such as purse  lipped breathing and diaphragmatic breathing; and practicing self-pacing with activity Yes   Intervention While in program, learn and utilize the specific breathing techniques taught to you. Continue to practice and use the techniques as needed.  Erika Martinez does perform PLB, but does forget to do it at times.   Increase knowledge of respiratory medications and ability to use respiratory devices properly.  Yes   Intervention While in program learn and demonstrate appropriate use of your oxygen therapy by increasing flow with exertion, manage oxygen tank operation, including continuous and intermittent flow.  Understanding oxygen is a drug ordered by your physician.;While in program, learn  to administer MDI, nebulizer, and spacer properly.;Learn to take respiratory medicine as ordered.;While in program, learn to Clean MDI, nebulizers, and spacers properly.  She has a good understanding of her Advair, Proventil, and spacer. She uses 2-4l/m of oxygen - adjusting the flows as needed; she has 2 portable tanks - one a concentrator and the other  [place].      Personal Goals and Risk Factors Review:      Goals and Risk Factor Review      12/18/14 1130 12/20/14 1130 12/27/14 1130 12/27/14 1513 01/08/15 1130   Increase Aerobic Exercise and Physical Activity   Goals Progress/Improvement seen      Yes   Comments     Erika Martinez states she has noticed an improvement in her stamina and endurance with her move into her new apartment. She is tired, but she has accomplished more physically than she expected due to [place].   Understand more about Heart/Pulmonary Disease   Goals Progress/Improvement seen  Yes   Yes    Comments Erika Martinez is planning to start a Pulmonary Fibrosis Support Group in the [place], [place] area. She has support from the [employer] and good educational material.   Erika Martinez shared information with me about a germ free vaporizer for use at night, and information on a  humidifier monitor which I can in turn share in my Trigger education session.    Improve shortness of breath with ADL's   Goals Progress/Improvement seen  Yes       Comments Erika Martinez states she already feels a difference with her breathing even after 5 sessions.       Breathing Techniques   Goals Progress/Improvement seen   Yes   Yes   Comments  Erika Martinez uses PLB during her exercise goals and during activity at home.   Erika Martinez uses PLB with her exercise goals and activity at home. She also paces herself.   Increase knowledge of respiratory medications   Goals Progress/Improvement seen  Yes  Yes  Yes   Comments Increased Erika Martinez oxygen to 6l/m on the XR - discussed the importance of keeping her O2Sat's in the 90's during activity.  Reviewed technique for Albuterol MDI with spacer - good understanding and demonstration  Erika Martinez is adjusting her oxygen independently with activites and use of her oximeter.     01/19/15 1418 01/29/15 1130 02/05/15 1130 02/07/15 1224     Increase Aerobic Exercise and Physical Activity   Goals Progress/Improvement seen   Yes      Comments  Erika Martinez improved her mid 15md by 1247f Minimal Importance Difference for Pulmonary Fibrosis is 78.72 to [id number]     Understand more about Heart/Pulmonary Disease   Goals Progress/Improvement seen   Yes Yes     Comments  Erika BalBraves been introduced to 2 other participants in [facility] have PF and are interested in her new support group she is starting in October. Erika BalSalley Hews very pro active for PF. 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 She is very excited about this opportunity.     Improve shortness of breath with ADL's   Goals Progress/Improvement seen  Yes   Yes    Comments Erika. BalWillowates that she is feeling much stronger.  She is excited about setteling into her new appartment and using the gym at her  complex.  She  feels like she can do more now and has less SOB.   SOB is decreasing on the exercise equipment. She doesn't really notice SOB around the house and sometimes doesn't recognize when her 02 saturation is low, but she has noticed an ability to do more without feeling tired. 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 She is still on prednisone which is helping with the SOB.     Breathing Techniques   Goals Progress/Improvement seen     Yes    Comments    She is still practicing the deep breathing. It is easy for her to take slow exhales, but still difficult to take deep breaths in. She is continuing to work on her breathing techniques.       Personal Goals Discharge:    Comments: Doing well in [facility]. Suzi Roots is concerned since she has to go back to work around children who frequently have contagious coughs/colds etc.

## 2015-02-19 NOTE — Progress Notes (Signed)
Incomplete Session Note  Patient Details  Name: Erika Martinez MRN: [id number] Date of Birth: December 17, 1955 Referring Provider:  Steele Sizer, MD  [name] did not complete her rehab session. 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

## 2015-02-19 NOTE — Progress Notes (Signed)
Pulmonary Individual Treatment Plan  Patient Details  Name: Erika Martinez MRN: [id number] Date of Birth: [birth year] Referring Provider:  Juanito Doom, MD  Initial Encounter Date:  12/05/2014 Visit Diagnosis: Pulmonary fibrosis  Patient's Home Medications on Admission:  Current outpatient prescriptions:  .  albuterol (PROVENTIL HFA;VENTOLIN HFA) 108 (90 BASE) MCG/ACT inhaler, Inhale 2 puffs into the lungs every 4 (four) hours as needed for wheezing or shortness of breath., Disp: 8 g, Rfl: 5 .  ALPRAZolam (XANAX) 0.5 MG tablet, Take 1 tablet (0.5 mg total) by mouth 3 (three) times daily as needed for anxiety., Disp: 45 tablet, Rfl: 2 .  Azelastine-Fluticasone (DYMISTA) 137-50 MCG/ACT SUSP, Place into the nose., Disp: , Rfl:  .  cholecalciferol (VITAMIN D) 1000 UNITS tablet, Take 2,000 Units by mouth daily., Disp: , Rfl:  .  diltiazem (CARDIZEM) 60 MG tablet, Take 1 tablet (60 mg total) by mouth daily., Disp: 30 tablet, Rfl: 2 .  Fluticasone-Salmeterol (ADVAIR DISKUS) 500-50 MCG/DOSE AEPB, Inhale 1 puff into the lungs 2 (two) times daily., Disp: 60 each, Rfl: 5 .  Fluticasone-Salmeterol (ADVAIR DISKUS) 500-50 MCG/DOSE AEPB, Inhale into the lungs., Disp: , Rfl:  .  loratadine (CLARITIN) 10 MG tablet, TAKE 1 TABLET DAILY, Disp: 30 tablet, Rfl: 3 .  nystatin (MYCOSTATIN) 100000 UNIT/ML suspension, TAKE 1 TABLESPOON BY MOUTH BEFORE MEALS AND AT BEDTIME (SWISH AND SPIT OUT), Disp: , Rfl: 2 .  omeprazole (PRILOSEC) 40 MG capsule, Take 1 capsule (40 mg total) by mouth daily., Disp: 30 capsule, Rfl: 3 .  predniSONE (DELTASONE) 20 MG tablet, Take 1 tablet (20 mg total) by mouth daily., Disp: 30 tablet, Rfl: 3 .  vitamin B-12 (CYANOCOBALAMIN) 250 MCG tablet, Place 250 mcg under the tongue daily., Disp: , Rfl:   Past Medical History: Past Medical History  Diagnosis Date  . Asthma   . Mitral valve prolapse   . Seasonal allergies   . Hypertension   . Abnormal hemoglobin   . Plantar wart   .  Symptomatic menopausal or female climacteric states   . Scleroderma     Dr. Pennie Banter  . Pulmonary fibrosis   . Allergy   . Oxygen dependent   . Erythrocytosis   . Hep C w/o coma, chronic   . Lumbago   . Anxiety   . GERD (gastroesophageal reflux disease)   . IBS (irritable bowel syndrome)   . Controlled insomnia   . Spondylolisthesis     Tobacco Use: History  Smoking status  . Never Smoker   Smokeless tobacco  . Never Used    Labs: Recent Review Flowsheet Data    Labs for ITP Cardiac and Pulmonary Rehab Latest Ref Rng 07/14/2013   Cholestrol 0 - 200 mg/dL 169   LDLCALC - 95   HDL 35 - 70 mg/dL 58   Trlycerides 40 - 160 mg/dL 78       ADL UCSD:     ADL UCSD      12/05/14 0920 01/29/15 1130     ADL UCSD   ADL Phase Entry Mid    SOB Score total 15 19    Rest 0 0    Walk 0 1    Stairs 1 3    Bath 0 1    Dress 0 0    Shop 1 1        Pulmonary Function Assessment:     Pulmonary Function Assessment - 12/05/14 0920    Pulmonary Function Tests   FVC% 48 %  FEV1% 50 %   FEV1/FVC Ratio 63   DLCO% 54 %   Breath   Bilateral Breath Sounds Basilar;Rales   Shortness of Breath Yes      Exercise Target Goals:    Exercise Program Goal: Individual exercise prescription set with THRR, safety & activity barriers. Participant demonstrates ability to understand and report RPE using BORG scale, to self-measure pulse accurately, and to acknowledge the importance of the exercise prescription.  Exercise Prescription Goal: Starting with aerobic activity 30 plus minutes a day, 3 days per week for initial exercise prescription. Provide home exercise prescription and guidelines that participant acknowledges understanding prior to discharge.  Activity Barriers & Risk Stratification:     Activity Barriers & Risk Stratification - 12/05/14 0920    Activity Barriers & Risk Stratification   Activity Barriers None      6 Minute Walk:     6 Minute Walk      12/05/14  1341 01/29/15 1240     6 Minute Walk   Phase Initial Mid Program    Distance 830 feet 950 feet    Walk Time 6 minutes 6 minutes    Resting HR 72 bpm 94 bpm    Resting BP 132/72 mmHg 110/62 mmHg    Max Ex. HR 98 bpm 121 bpm    Max Ex. BP 146/78 mmHg 178/78 mmHg    RPE 15 16    Perceived Dyspnea  4 4    Symptoms No No       Initial Exercise Prescription:     Initial Exercise Prescription - 12/08/14 1100    Treadmill   MPH 1.5   Grade 0   Minutes 10   NuStep   Level 2   Watts 40   Minutes 15   REL-XR   Level 2   Watts 40   Minutes 10   Resistance Training   Training Prescription Yes   Weight 2   Reps 10-15      Exercise Prescription Changes:     Exercise Prescription Changes      12/19/14 0800 12/27/14 1200 01/01/15 1200 01/08/15 1200 01/17/15 1500   Exercise Review   Progression   Yes Yes Yes   Response to Exercise   Blood Pressure (Admit) --  Entry for 12/18/2014    110/68 mmHg   Blood Pressure (Exercise)     126/74 mmHg   Blood Pressure (Exit)     104/66 mmHg   Heart Rate (Admit)     61 bpm   Heart Rate (Exercise)     110 bpm   Heart Rate (Exit)     88 bpm   Oxygen Saturation (Admit)     100 %  4l/m   Oxygen Saturation (Exercise)     96 %  6l/m   Oxygen Saturation (Exit)     100 %   Rating of Perceived Exertion (Exercise)     12   Perceived Dyspnea (Exercise)     2   Resistance Training   Training Prescription     Yes   Weight     2   Reps     10-12   Treadmill   MPH    1.5 1.5   Grade    0 0   Minutes    15 15   NuStep   Level  _0 Watts  50 50 50 50   Minutes  _1 REL-XR  Level     3   Watts     50   Minutes   _0 01/22/15 1100 01/29/15 1100         Exercise Review   Progression Yes Yes      Response to Exercise   Blood Pressure (Admit) 110/68 mmHg 110/68 mmHg      Blood Pressure (Exercise) 126/74 mmHg 126/74 mmHg      Blood Pressure (Exit) 104/66 mmHg 104/66 mmHg      Heart Rate (Admit) 61 bpm 61 bpm       Heart Rate (Exercise) 110 bpm 110 bpm      Heart Rate (Exit) 88 bpm 88 bpm      Oxygen Saturation (Admit) 100 %  4l/m 100 %  4l/m      Oxygen Saturation (Exercise) 96 %  6l/m 96 %  6l/m      Oxygen Saturation (Exit) 100 % 100 %      Rating of Perceived Exertion (Exercise) 12 12      Perceived Dyspnea (Exercise) 2 2      Resistance Training   Training Prescription Yes Yes      Weight 2 2      Reps 10-12 10-12      Treadmill   MPH 1.7 1.7      Grade 0 0      Minutes 15 15      NuStep   Level 3 3      Watts 50 50      Minutes 15 15      REL-XR   Level 3 4      Watts 50 55      Minutes 15 15         Discharge Exercise Prescription (Final Exercise Prescription Changes):     Exercise Prescription Changes - 01/29/15 1100    Exercise Review   Progression Yes   Response to Exercise   Blood Pressure (Admit) 110/68 mmHg   Blood Pressure (Exercise) 126/74 mmHg   Blood Pressure (Exit) 104/66 mmHg   Heart Rate (Admit) 61 bpm   Heart Rate (Exercise) 110 bpm   Heart Rate (Exit) 88 bpm   Oxygen Saturation (Admit) 100 %  4l/m   Oxygen Saturation (Exercise) 96 %  6l/m   Oxygen Saturation (Exit) 100 %   Rating of Perceived Exertion (Exercise) 12   Perceived Dyspnea (Exercise) 2   Resistance Training   Training Prescription Yes   Weight 2   Reps 10-12   Treadmill   MPH 1.7   Grade 0   Minutes 15   NuStep   Level 3   Watts 50   Minutes 15   REL-XR   Level 4   Watts 55   Minutes 15       Nutrition:  Target Goals: Understanding of nutrition guidelines, daily intake of sodium <1558m, cholesterol <2066m calories 30% from fat and 7% or less from saturated fats, daily to have 5 or more servings of fruits and vegetables.  Biometrics:    Nutrition Therapy Plan and Nutrition Goals:     Nutrition Therapy & Goals - 12/05/14 0920    Nutrition Therapy   Diet Ms BaMclainerefers not to meet with the dietitian; she manages her diet well with small portion sizes,  limits sweets; drinks 6 glasses of water/day and 2 ginger ale soda/day; no caffeine.      Nutrition Discharge: Rate Your Plate Scores:  Psychosocial: Target Goals: Acknowledge presence or absence of depression, maximize coping skills, provide positive support system. Participant is able to verbalize types and ability to use techniques and skills needed for reducing stress and depression.  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    Comments --  Recently Ms Anagnos could not go on a yearly trip to [place] and was very diappointed. Her physician did not want her traveling due to recent treatment..   Family Dynamics   Good Support System? Yes  Ms Lenis has good support from her friends and she is very involved in a IPF Support Group.   Barriers   Psychosocial barriers to participate in program The patient should benefit from training in stress management and relaxation.   Screening Interventions   Interventions Program counselor consult;Encouraged to exercise      Quality of Life Scores:     Quality of Life - 12/05/14 1052    Quality of Life Scores   Health/Function Pre 17.22 %   Socioeconomic Pre 20.71 %   Psych/Spiritual Pre 24 %   Family Pre 22.8 %   GLOBAL Pre 20.07 %      PHQ-9:     Recent Review Flowsheet Data    Depression screen [facility] 2/9 02/02/2015 12/05/2014   Decreased Interest 0 1   Down, Depressed, Hopeless 0 0   PHQ - 2 Score 0 1      Psychosocial Evaluation and Intervention:     Psychosocial Evaluation - 12/20/14 1237    Psychosocial Evaluation &  Interventions   Comments Counselor met with Ms. Radabaugh today for initial psychosocial evaluation.  She has returned to this program due to shortness of breath.  She reports she stopped exercising since she was working so many hours and "regressed" as a result.  Her mood is reportedly better with no depressive symptoms currently.  She continues to struggle with some anxiety symptoms and takes her medication for this as needed.  Ms. Harnish reports she is moving in the near future to a facility that has a 24 hour gym which will help her be more consistent in maintaining her goals once she completes this program.  Counselor will continue to follow with her as needed.     Continued Psychosocial Services Needed Yes  Follow up on mood and anxiety symptoms closer to her job starting back up for the fall.        Psychosocial Re-Evaluation:  Education: Education Goals: Education classes will be provided on a weekly basis, covering required topics. Participant will state understanding/return demonstration of topics presented.  Learning Barriers/Preferences:     Learning Barriers/Preferences - 12/05/14 0920    Learning Barriers/Preferences   Learning Barriers None   Learning Preferences Group Instruction;Individual Instruction;Pictoral;Skilled Demonstration;Verbal Instruction;Video;Written Material      Education Topics: Initial Evaluation Education: - Verbal, written and demonstration of respiratory meds, RPE/PD scales, oximetry and breathing techniques. Instruction on use of nebulizers and MDIs: cleaning and proper use, rinsing mouth with steroid doses and importance of monitoring MDI activations.          Pulmonary Rehab from  01/31/2015 in [place]   Date  12/05/14   Educator  LB   Instruction Review Code  2- meets goals/outcomes      General Nutrition Guidelines/Fats and Fiber: -Group instruction provided by verbal, written material, models and  posters to present the general guidelines for heart healthy nutrition. Gives an explanation and review of dietary fats and fiber.      Pulmonary Rehab from 01/31/2015 in [place]   Date  01/22/15   Educator  C. Joneen Caraway, RD   Instruction Review Code  2- meets goals/outcomes      Controlling Sodium/Reading Food Labels: -Group verbal and written material supporting the discussion of sodium use in heart healthy nutrition. Review and explanation with models, verbal and written materials for utilization of the food label.      Pulmonary Rehab from 01/31/2015 in [place]   Date  01/29/15   Educator  C. Joneen Caraway, RD   Instruction Review Code  2- meets goals/outcomes      Exercise Physiology & Risk Factors: - Group verbal and written instruction with models to review the exercise physiology of the cardiovascular system and associated critical values. Details cardiovascular disease risk factors and the goals associated with each risk factor.   Aerobic Exercise & Resistance Training: - Gives group verbal and written discussion on the health impact of inactivity. On the components of aerobic and resistive training programs and the benefits of this training and how to safely progress through these programs.   Flexibility, Balance, General Exercise Guidelines: - Provides group verbal and written instruction on the benefits of flexibility and balance training programs. Provides general exercise guidelines with specific guidelines to those with heart or lung disease. Demonstration and skill practice provided.   Stress Management: - Provides group verbal and written instruction about the health risks of elevated stress, cause of high stress, and healthy ways to reduce stress.      Pulmonary Rehab from 01/31/2015 in [place]   Date  01/03/15   Educator  [facility]   Instruction Review Code  2-  meets goals/outcomes      Depression: - Provides group verbal and written instruction on the correlation between heart/lung disease and depressed mood, treatment options, and the stigmas associated with seeking treatment.   Exercise & Equipment Safety: - Individual verbal instruction and demonstration of equipment use and safety with use of the equipment.      Pulmonary Rehab from 01/31/2015 in [place]   Date  01/01/15   Educator  L. Owens Shark, RRT   Instruction Review Code  2- meets goals/outcomes      Infection Prevention: - Provides verbal and written material to individual with discussion of infection control including proper hand washing and proper equipment cleaning during exercise session.      Pulmonary Rehab from 01/31/2015 in [place]   Date  12/08/14   Educator  [place]   Instruction Review Code  2- meets goals/outcomes      Falls Prevention: - Provides verbal and written material to individual with discussion of falls prevention and safety.      Pulmonary Rehab from 01/31/2015 in [place]   Date  12/05/14   Educator  LB   Instruction Review Code  2- meets goals/outcomes      Diabetes: - Individual verbal and written instruction to review  signs/symptoms of diabetes, desired ranges of glucose level fasting, after meals and with exercise. Advice that pre and post exercise glucose checks will be done for 3 sessions at entry of program.   Chronic Lung Diseases: - Group verbal and written instruction to review new updates, new respiratory medications, new advancements in procedures and treatments. Provide informative websites and "800" numbers of self-education.   Lung Procedures: - Group verbal and written instruction to describe testing methods done to diagnose lung disease. Review the outcome of test results. Describe the treatment choices: Pulmonary  Function Tests, ABGs and oximetry.      Pulmonary Rehab from 01/31/2015 in [place]   Date  01/12/15   Educator  sj   Instruction Review Code  2- meets goals/outcomes      Energy Conservation: - Provide group verbal and written instruction for methods to conserve energy, plan and organize activities. Instruct on pacing techniques, use of adaptive equipment and posture/positioning to relieve shortness of breath.      Pulmonary Rehab from 01/31/2015 in [place]   Date  01/24/15   Educator  SW   Instruction Review Code  2- meets goals/outcomes      Triggers: - Group verbal and written instruction to review types of environmental controls: home humidity, furnaces, filters, dust mite/pet prevention, HEPA vacuums. To discuss weather changes, air quality and the benefits of nasal washing.      Pulmonary Rehab from 01/31/2015 in [place]   Date  12/18/14   Educator  LB   Instruction Review Code  2- meets goals/outcomes      Exacerbations: - Group verbal and written instruction to provide: warning signs, infection symptoms, calling MD promptly, preventive modes, and value of vaccinations. Review: effective airway clearance, coughing and/or vibration techniques. Create an Sports administrator.   Oxygen: - Individual and group verbal and written instruction on oxygen therapy. Includes supplement oxygen, available portable oxygen systems, continuous and intermittent flow rates, oxygen safety, concentrators, and Medicare reimbursement for oxygen.      Pulmonary Rehab from 01/31/2015 in [place]   Date  12/05/14   Educator  LB   Instruction Review Code  2- meets goals/outcomes      Respiratory Medications: - Group verbal and written instruction to review medications for lung disease. Drug class, frequency, complications, importance of spacers,  rinsing mouth after steroid MDI's, and proper cleaning methods for nebulizers.      Pulmonary Rehab from 01/31/2015 in [place]   Date  12/22/14   Educator  [place]   Instruction Review Code  2- meets goals/outcomes [Spacer and instructions given to pt. ]      AED/CPR: - Group verbal and written instruction with the use of models to demonstrate the basic use of the AED with the basic ABC's of resuscitation.      Pulmonary Rehab from 01/31/2015 in [place]   Date  01/19/15   Educator  CE   Instruction Review Code  2- meets goals/outcomes      Breathing Retraining: - Provides individuals verbal and written instruction on purpose, frequency, and proper technique of diaphragmatic breathing and pursed-lipped breathing. Applies individual practice skills.      Pulmonary Rehab from 01/31/2015 in [place]   Date  12/05/14   Educator  LB   Instruction Review Code  2- meets  goals/outcomes      Anatomy and Physiology of the Lungs: - Group verbal and written instruction with the use of models to provide basic lung anatomy and physiology related to function, structure and complications of lung disease.   Heart Failure: - Group verbal and written instruction on the basics of heart failure: signs/symptoms, treatments, explanation of ejection fraction, enlarged heart and cardiomyopathy.      Pulmonary Rehab from 01/31/2015 in [place]   Date  12/29/14   Educator  [place]   Instruction Review Code  2- meets goals/outcomes      Sleep Apnea: - Individual verbal and written instruction to review Obstructive Sleep Apnea. Review of risk factors, methods for diagnosing and types of masks and machines for OSA.   Anxiety: - Provides group, verbal and written instruction on the correlation between heart/lung disease and anxiety, treatment options, and  management of anxiety.      Pulmonary Rehab from 01/31/2015 in [place]   Date  01/31/15   Educator  [facility]   Instruction Review Code  2- Meets goals/outcomes      Relaxation: - Provides group, verbal and written instruction about the benefits of relaxation for patients with heart/lung disease. Also provides patients with examples of relaxation techniques.   Knowledge Questionnaire Score:     Knowledge Questionnaire Score - 12/05/14 0920    Knowledge Questionnaire Score   Pre Score -2      Personal Goals and Risk Factors at Admission:     Personal Goals and Risk Factors at Admission - 12/05/14 1039    Personal Goals and Risk Factors on Admission    Weight Management No   Increase Aerobic Exercise and Physical Activity Yes   Intervention While in program, learn and follow the exercise prescription taught. Start at a low level workload and increase workload after able to maintain previous level for 30 minutes. Increase time before increasing intensity.  Ms Sylvestre was a member of [facility] and plans to return there after LW.   Quit Smoking No   Take Less Medication No   Understand more about Heart/Pulmonary Disease. Yes   Intervention While in program utilize professionals for any questions, and attend the education sessions. Great websites to use are www.americanheart.org or www.lung.org for reliable information.  Ms Wentworth is very motivated to learn new information on medical issues. She is planning to start a Pulmonary Support Group.   Improve shortness of breath with ADL's Yes   Intervention While in program, learn and follow the exercise prescription taught. Start at a low level workload and increase workload ad advised by the exercise physiologist. Increase time before increasing intensity.  Her UCSD SOB Questionnaire was scored 15.  She knows to pace herself with activities.   Develop more efficient breathing techniques such as purse  lipped breathing and diaphragmatic breathing; and practicing self-pacing with activity Yes   Intervention While in program, learn and utilize the specific breathing techniques taught to you. Continue to practice and use the techniques as needed.  Ms Irigoyen does perform PLB, but does forget to do it at times.   Increase knowledge of respiratory medications and ability to use respiratory devices properly.  Yes   Intervention While in program learn and demonstrate appropriate use of your oxygen therapy by increasing flow with exertion, manage oxygen tank operation, including continuous and intermittent flow.  Understanding oxygen is a drug ordered by your physician.;While in program, learn to administer  MDI, nebulizer, and spacer properly.;Learn to take respiratory medicine as ordered.;While in program, learn to Clean MDI, nebulizers, and spacers properly.  She has a good understanding of her Advair, Proventil, and spacer. She uses 2-4l/m of oxygen - adjusting the flows as needed; she has 2 portable tanks - one a concentrator and the other  [place].      Personal Goals and Risk Factors Review:      Goals and Risk Factor Review      12/18/14 1130 12/20/14 1130 12/27/14 1130 12/27/14 1513 01/08/15 1130   Increase Aerobic Exercise and Physical Activity   Goals Progress/Improvement seen      Yes   Comments     Ms Benham states she has noticed an improvement in her stamina and endurance with her move into her new apartment. She is tired, but she has accomplished more physically than she expected due to [place].   Understand more about Heart/Pulmonary Disease   Goals Progress/Improvement seen  Yes   Yes    Comments Ms Kuba is planning to start a Pulmonary Fibrosis Support Group in the [place], [place] area. She has support from the IPF Association and good educational material.   Ms Eddleman shared information with me about a germ free vaporizer for use at night, and information on a  humidifier monitor which I can in turn share in my Trigger education session.    Improve shortness of breath with ADL's   Goals Progress/Improvement seen  Yes       Comments Ms Graig states she already feels a difference with her breathing even after 5 sessions.       Breathing Techniques   Goals Progress/Improvement seen   Yes   Yes   Comments  Ms Lichtman uses PLB during her exercise goals and during activity at home.   Ms Chisenhall uses PLB with her exercise goals and activity at home. She also paces herself.   Increase knowledge of respiratory medications   Goals Progress/Improvement seen  Yes  Yes  Yes   Comments Increased Ms Mccomber oxygen to 6l/m on the XR - discussed the importance of keeping her O2Sat's in the 90's during activity.  Reviewed technique for Albuterol MDI with spacer - good understanding and demonstration  Ms Bourne is adjusting her oxygen independently with activites and use of her oximeter.     01/19/15 1418 01/29/15 1130 02/05/15 1130 02/07/15 1224     Increase Aerobic Exercise and Physical Activity   Goals Progress/Improvement seen   Yes      Comments  Ms Picazo improved her mid 60md by 1237f Minimal Importance Difference for Pulmonary Fibrosis is 78.72 to [id number]     Understand more about Heart/Pulmonary Disease   Goals Progress/Improvement seen   Yes Yes     Comments  Ms BalBarradass been introduced to 2 other participants in [place] have PF and are interested in her new support group she is starting in October. Ms BalSalley Hews very pro active for PF. 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 She is very excited about this opportunity.     Improve shortness of breath with ADL's   Goals Progress/Improvement seen  Yes   Yes    Comments Ms. BalNairnates that she is feeling much stronger.  She is excited about setteling into her new appartment and using the gym at her complex.  She  feels like she can do more now and has less SOB.   SOB is decreasing on the exercise equipment. She doesn't really notice SOB around the house and sometimes doesn't recognize when her 02 saturation is low, but she has noticed an ability to do more without feeling tired. 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 She is still on prednisone which is helping with the SOB.     Breathing Techniques   Goals Progress/Improvement seen     Yes    Comments    She is still practicing the deep breathing. It is easy for her to take slow exhales, but still difficult to take deep breaths in. She is continuing to work on her breathing techniques.       Personal Goals Discharge:    Comments: 30 day note day = Sept 12, 2016

## 2015-02-19 NOTE — Telephone Encounter (Signed)
Patient returned call and can be reached at [phone] can be reached until 1:00pm, then after 4:00 pm today.

## 2015-02-19 NOTE — Addendum Note (Signed)
Addended by: Gerlene Burdock on: 02/19/2015 12:50 PM   Modules accepted: Orders

## 2015-02-23 ENCOUNTER — Encounter: Payer: 59 | Admitting: *Deleted

## 2015-02-23 DIAGNOSIS — J8410 Pulmonary fibrosis, unspecified: Secondary | ICD-10-CM

## 2015-02-23 NOTE — Progress Notes (Signed)
Daily Session Note  Patient Details  Name: Erika Martinez MRN: [id number] Date of Birth: 1955/09/26 Referring Provider:  Juanito Doom, MD  Encounter Date: 02/23/2015  Check In:     Session Check In - 02/23/15 1253    Check-In   Staff Present Candiss Norse MS, ACSM CEP Exercise Physiologist;Stacey Blanch Media RRT, RCP Respiratory Therapist;Carroll Enterkin RN, BSN   ER physicians immediately available to respond to emergencies [name] immediately available ER MD   Physician(s) Dr. Jimmye Norman, Dr. Burlene Arnt   Medication changes reported     No   Fall or balance concerns reported    No   Warm-up and Cool-down Performed on first and last piece of equipment   VAD Patient? No   Pain Assessment   Currently in Pain? No/denies         Goals Met:  Proper associated with RPD/PD & O2 Sat Exercise tolerated well  Goals Unmet:  Not Applicable  Goals Comments: Parker is doing well on REL. Uses 6 liters of oxgyen for exercise.    Dr. Emily Filbert is Medical Director for [facility] and [name].

## 2015-02-23 NOTE — Progress Notes (Signed)
Cardiac Individual Treatment Plan  Patient Details  Name: Erika Martinez MRN: [id number] Date of Birth: [birth year] Referring Provider:  Juanito Doom, MD  Initial Encounter Date:    Visit Diagnosis: Pulmonary interstitial fibrosis  Patient's Home Medications on Admission:  Current outpatient prescriptions:  .  albuterol (PROVENTIL HFA;VENTOLIN HFA) 108 (90 BASE) MCG/ACT inhaler, Inhale 2 puffs into the lungs every 4 (four) hours as needed for wheezing or shortness of breath., Disp: 8 g, Rfl: 5 .  ALPRAZolam (XANAX) 0.5 MG tablet, Take 1 tablet (0.5 mg total) by mouth 3 (three) times daily as needed for anxiety., Disp: 45 tablet, Rfl: 2 .  Azelastine-Fluticasone (DYMISTA) 137-50 MCG/ACT SUSP, Place into the nose., Disp: , Rfl:  .  cholecalciferol (VITAMIN D) 1000 UNITS tablet, Take 2,000 Units by mouth daily., Disp: , Rfl:  .  diltiazem (CARDIZEM) 60 MG tablet, Take 1 tablet (60 mg total) by mouth daily., Disp: 30 tablet, Rfl: 2 .  Fluticasone-Salmeterol (ADVAIR DISKUS) 500-50 MCG/DOSE AEPB, Inhale 1 puff into the lungs 2 (two) times daily., Disp: 60 each, Rfl: 5 .  Fluticasone-Salmeterol (ADVAIR DISKUS) 500-50 MCG/DOSE AEPB, Inhale into the lungs., Disp: , Rfl:  .  loratadine (CLARITIN) 10 MG tablet, TAKE 1 TABLET DAILY, Disp: 30 tablet, Rfl: 3 .  nystatin (MYCOSTATIN) 100000 UNIT/ML suspension, TAKE 1 TABLESPOON BY MOUTH BEFORE MEALS AND AT BEDTIME (SWISH AND SPIT OUT), Disp: , Rfl: 2 .  omeprazole (PRILOSEC) 40 MG capsule, Take 1 capsule (40 mg total) by mouth daily., Disp: 30 capsule, Rfl: 3 .  predniSONE (DELTASONE) 20 MG tablet, Take 1 tablet (20 mg total) by mouth daily., Disp: 30 tablet, Rfl: 3 .  vitamin B-12 (CYANOCOBALAMIN) 250 MCG tablet, Place 250 mcg under the tongue daily., Disp: , Rfl:   Past Medical History: Past Medical History  Diagnosis Date  . Asthma   . Mitral valve prolapse   . Seasonal allergies   . Hypertension   . Abnormal hemoglobin   . Plantar wart    . Symptomatic menopausal or female climacteric states   . Scleroderma     Dr. Pennie Banter  . Pulmonary fibrosis   . Allergy   . Oxygen dependent   . Erythrocytosis   . Hep C w/o coma, chronic   . Lumbago   . Anxiety   . GERD (gastroesophageal reflux disease)   . IBS (irritable bowel syndrome)   . Controlled insomnia   . Spondylolisthesis     Tobacco Use: History  Smoking status  . Never Smoker   Smokeless tobacco  . Never Used    Labs: Recent Review Flowsheet Data    Labs for ITP Cardiac and Pulmonary Rehab Latest Ref Rng 07/14/2013   Cholestrol 0 - 200 mg/dL 169   LDLCALC - 95   HDL 35 - 70 mg/dL 58   Trlycerides 40 - 160 mg/dL 78       Exercise Target Goals:    Exercise Program Goal: Individual exercise prescription set with THRR, safety & activity barriers. Participant demonstrates ability to understand and report RPE using BORG scale, to self-measure pulse accurately, and to acknowledge the importance of the exercise prescription.  Exercise Prescription Goal: Starting with aerobic activity 30 plus minutes a day, 3 days per week for initial exercise prescription. Provide home exercise prescription and guidelines that participant acknowledges understanding prior to discharge.  Activity Barriers & Risk Stratification:     Activity Barriers & Risk Stratification - 12/05/14 0920    Activity Barriers & Risk  Stratification   Activity Barriers None      6 Minute Walk:     6 Minute Walk      12/05/14 1341 01/29/15 1240     6 Minute Walk   Phase Initial Mid Program    Distance 830 feet 950 feet    Walk Time 6 minutes 6 minutes    Resting HR 72 bpm 94 bpm    Resting BP 132/72 mmHg 110/62 mmHg    Max Ex. HR 98 bpm 121 bpm    Max Ex. BP 146/78 mmHg 178/78 mmHg    RPE 15 16    Perceived Dyspnea  4 4    Symptoms No No       Initial Exercise Prescription:     Initial Exercise Prescription - 12/08/14 1100    Treadmill   MPH 1.5   Grade 0   Minutes 10    NuStep   Level 2   Watts 40   Minutes 15   REL-XR   Level 2   Watts 40   Minutes 10   Resistance Training   Training Prescription Yes   Weight 2   Reps 10-15      Exercise Prescription Changes:     Exercise Prescription Changes      12/19/14 0800 12/27/14 1200 01/01/15 1200 01/08/15 1200 01/17/15 1500   Exercise Review   Progression   Yes Yes Yes   Response to Exercise   Blood Pressure (Admit) --  Entry for 12/18/2014    110/68 mmHg   Blood Pressure (Exercise)     126/74 mmHg   Blood Pressure (Exit)     104/66 mmHg   Heart Rate (Admit)     61 bpm   Heart Rate (Exercise)     110 bpm   Heart Rate (Exit)     88 bpm   Oxygen Saturation (Admit)     100 %  4l/m   Oxygen Saturation (Exercise)     96 %  6l/m   Oxygen Saturation (Exit)     100 %   Rating of Perceived Exertion (Exercise)     12   Perceived Dyspnea (Exercise)     2   Resistance Training   Training Prescription     Yes   Weight     2   Reps     10-12   Treadmill   MPH    1.5 1.5   Grade    0 0   Minutes    15 15   NuStep   Level  '3 3 3 3   ' Watts  50 50 50 50   Minutes  '15 15 15 15   ' REL-XR   Level     3   Watts     50   Minutes   '15 15 15     ' 01/22/15 1100 01/29/15 1100         Exercise Review   Progression Yes Yes      Response to Exercise   Blood Pressure (Admit) 110/68 mmHg 110/68 mmHg      Blood Pressure (Exercise) 126/74 mmHg 126/74 mmHg      Blood Pressure (Exit) 104/66 mmHg 104/66 mmHg      Heart Rate (Admit) 61 bpm 61 bpm      Heart Rate (Exercise) 110 bpm 110 bpm      Heart Rate (Exit) 88 bpm 88 bpm      Oxygen Saturation (Admit) 100 %  4l/m 100 %  4l/m      Oxygen Saturation (Exercise) 96 %  6l/m 96 %  6l/m      Oxygen Saturation (Exit) 100 % 100 %      Rating of Perceived Exertion (Exercise) 12 12      Perceived Dyspnea (Exercise) 2 2      Resistance Training   Training Prescription Yes Yes      Weight 2 2      Reps 10-12 10-12      Treadmill   MPH 1.7 1.7      Grade 0 0       Minutes 15 15      NuStep   Level 3 3      Watts 50 50      Minutes 15 15      REL-XR   Level 3 4      Watts 50 55      Minutes 15 15         Discharge Exercise Prescription (Final Exercise Prescription Changes):     Exercise Prescription Changes - 01/29/15 1100    Exercise Review   Progression Yes   Response to Exercise   Blood Pressure (Admit) 110/68 mmHg   Blood Pressure (Exercise) 126/74 mmHg   Blood Pressure (Exit) 104/66 mmHg   Heart Rate (Admit) 61 bpm   Heart Rate (Exercise) 110 bpm   Heart Rate (Exit) 88 bpm   Oxygen Saturation (Admit) 100 %  4l/m   Oxygen Saturation (Exercise) 96 %  6l/m   Oxygen Saturation (Exit) 100 %   Rating of Perceived Exertion (Exercise) 12   Perceived Dyspnea (Exercise) 2   Resistance Training   Training Prescription Yes   Weight 2   Reps 10-12   Treadmill   MPH 1.7   Grade 0   Minutes 15   NuStep   Level 3   Watts 50   Minutes 15   REL-XR   Level 4   Watts 55   Minutes 15      Nutrition:  Target Goals: Understanding of nutrition guidelines, daily intake of sodium <15105m, cholesterol <2018m calories 30% from fat and 7% or less from saturated fats, daily to have 5 or more servings of fruits and vegetables.  Biometrics:    Nutrition Therapy Plan and Nutrition Goals:     Nutrition Therapy & Goals - 12/05/14 0920    Nutrition Therapy   Diet Ms BaBabicrefers not to meet with the dietitian; she manages her diet well with small portion sizes, limits sweets; drinks 6 glasses of water/day and 2 ginger ale soda/day; no caffeine.      Nutrition Discharge: Rate Your Plate Scores:   Nutrition Goals Re-Evaluation:   Psychosocial: Target Goals: Acknowledge presence or absence of depression, maximize coping skills, provide positive support system. Participant is able to verbalize types and ability to use techniques and skills needed for reducing stress and depression.  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  Her physician did not want her traveling due to recent treatment..   Family Dynamics   Good Support System? Yes  Ms Deliz has good support from her friends and she is very involved in a IPF Support Group.   Barriers   Psychosocial barriers to participate in program The patient should benefit from training in stress management and relaxation.   Screening Interventions   Interventions Program counselor consult;Encouraged to exercise      Quality of Life Scores:     Quality of Life - 12/05/14 1052    Quality of Life Scores   Health/Function Pre 17.22 %   Socioeconomic Pre 20.71 %   Psych/Spiritual Pre 24 %   Family Pre 22.8 %   GLOBAL Pre 20.07 %      PHQ-9:     Recent Review Flowsheet Data    Depression screen [facility] 2/9 02/02/2015 12/05/2014   Decreased Interest 0 1   Down, Depressed, Hopeless 0 0   PHQ - 2 Score 0 1      Psychosocial Evaluation and Intervention:     Psychosocial Evaluation - 12/20/14 1237    Psychosocial Evaluation & Interventions   Comments Counselor met with Ms. Umbaugh today for initial psychosocial evaluation.  She has returned to this program due to shortness of breath.  She reports she stopped exercising since she was working so many hours and "regressed" as a result.  Her mood is reportedly better with no depressive symptoms currently.  She continues to struggle with some anxiety symptoms and takes her medication for this as needed.  Ms. Hochberg reports she  is moving in the near future to a facility that has a 24 hour gym which will help her be more consistent in maintaining her goals once she completes this program.  Counselor will continue to follow with her as needed.     Continued Psychosocial Services Needed Yes  Follow up on mood and anxiety symptoms closer to her job starting back up for the fall.        Psychosocial Re-Evaluation:   Vocational Rehabilitation: Provide vocational rehab assistance to qualifying candidates.   Vocational Rehab Evaluation & Intervention:   Education: Education Goals: Education classes will be provided on a weekly basis, covering required topics. Participant will state understanding/return demonstration of topics presented.  Learning Barriers/Preferences:     Learning Barriers/Preferences - 12/05/14 0920    Learning Barriers/Preferences   Learning Barriers None   Learning Preferences Group Instruction;Individual Instruction;Pictoral;Skilled Demonstration;Verbal Instruction;Video;Written Material      Education Topics: General Nutrition Guidelines/Fats and Fiber: -Group instruction provided by verbal, written material, models and posters to present the general guidelines for heart healthy nutrition. Gives an explanation and review of dietary fats and fiber.          Pulmonary Rehab from 02/23/2015 in [place]   Date  01/22/15   Educator  C. Joneen Caraway, RD   Instruction Review Code  2- meets goals/outcomes      Controlling Sodium/Reading Food Labels: -Group verbal and written material supporting the discussion of sodium use in heart healthy nutrition. Review and explanation with models, verbal and written materials for utilization of the food label.      Pulmonary Rehab from 02/23/2015 in [place]   Date  01/29/15   Educator  C. Joneen Caraway, RD   Instruction Review Code  2- meets goals/outcomes      Exercise Physiology  & Risk Factors: - Group verbal  and written instruction with models to review the exercise physiology of the cardiovascular system and associated critical values. Details cardiovascular disease risk factors and the goals associated with each risk factor.   Aerobic Exercise & Resistance Training: - Gives group verbal and written discussion on the health impact of inactivity. On the components of aerobic and resistive training programs and the benefits of this training and how to safely progress through these programs.   Flexibility, Balance, General Exercise Guidelines: - Provides group verbal and written instruction on the benefits of flexibility and balance training programs. Provides general exercise guidelines with specific guidelines to those with heart or lung disease. Demonstration and skill practice provided.   Stress Management: - Provides group verbal and written instruction about the health risks of elevated stress, cause of high stress, and healthy ways to reduce stress.      Pulmonary Rehab from 02/23/2015 in [place]   Date  01/03/15   Educator  [facility]   Instruction Review Code  2- meets goals/outcomes      Depression: - Provides group verbal and written instruction on the correlation between heart/lung disease and depressed mood, treatment options, and the stigmas associated with seeking treatment.   Anatomy & Physiology of the Heart: - Group verbal and written instruction and models provide basic cardiac anatomy and physiology, with the coronary electrical and arterial systems. Review of: AMI, Angina, Valve disease, Heart Failure, Cardiac Arrhythmia, Pacemakers, and the ICD.      Pulmonary Rehab from 02/23/2015 in [place]   Date  12/08/14   Educator  CE   Instruction Review Code  2- meets goals/outcomes      Cardiac Procedures: - Group verbal and written instruction and models to describe the  testing methods done to diagnose heart disease. Reviews the outcomes of the test results. Describes the treatment choices: Medical Management, Angioplasty, or Coronary Bypass Surgery.   Cardiac Medications: - Group verbal and written instruction to review commonly prescribed medications for heart disease. Reviews the medication, class of the drug, and side effects. Includes the steps to properly store meds and maintain the prescription regimen.      Pulmonary Rehab from 02/23/2015 in [place]   Date  12/22/14   Educator  CE   Instruction Review Code  2- meets goals/outcomes      Go Sex-Intimacy & Heart Disease, Get SMART - Goal Setting: - Group verbal and written instruction through game format to discuss heart disease and the return to sexual intimacy. Provides group verbal and written material to discuss and apply goal setting through the application of the S.M.A.R.T. Method.   Other Matters of the Heart: - Provides group verbal, written materials and models to describe Heart Failure, Angina, Valve Disease, and Diabetes in the realm of heart disease. Includes description of the disease process and treatment options available to the cardiac patient.   Exercise & Equipment Safety: - Individual verbal instruction and demonstration of equipment use and safety with use of the equipment.      Pulmonary Rehab from 02/23/2015 in [place]   Date  01/01/15   Educator  L. Owens Shark, RRT   Instruction Review Code  2- meets goals/outcomes      Infection Prevention: - Provides verbal and written material to individual with discussion of infection control including proper hand washing and proper equipment cleaning during exercise session.      Pulmonary  Rehab from 02/23/2015 in [place]   Date  12/08/14   Educator  [place]   Instruction Review Code  2- meets goals/outcomes      Falls  Prevention: - Provides verbal and written material to individual with discussion of falls prevention and safety.      Pulmonary Rehab from 02/23/2015 in [place]   Date  12/05/14   Educator  LB   Instruction Review Code  2- meets goals/outcomes      Diabetes: - Individual verbal and written instruction to review signs/symptoms of diabetes, desired ranges of glucose level fasting, after meals and with exercise. Advice that pre and post exercise glucose checks will be done for 3 sessions at entry of program.    Knowledge Questionnaire Score:     Knowledge Questionnaire Score - 12/05/14 0920    Knowledge Questionnaire Score   Pre Score -2      Personal Goals and Risk Factors at Admission:     Personal Goals and Risk Factors at Admission - 12/05/14 1039    Personal Goals and Risk Factors on Admission    Weight Management No   Increase Aerobic Exercise and Physical Activity Yes   Intervention While in program, learn and follow the exercise prescription taught. Start at a low level workload and increase workload after able to maintain previous level for 30 minutes. Increase time before increasing intensity.  Ms Enriques was a member of [facility] and plans to return there after LW.   Quit Smoking No   Take Less Medication No   Understand more about Heart/Pulmonary Disease. Yes   Intervention While in program utilize professionals for any questions, and attend the education sessions. Great websites to use are www.americanheart.org or www.lung.org for reliable information.  Ms Radebaugh is very motivated to learn new information on medical issues. She is planning to start a Pulmonary Support Group.   Improve shortness of breath with ADL's Yes   Intervention While in program, learn and follow the exercise prescription taught. Start at a low level workload and increase workload ad advised by the exercise physiologist. Increase time before increasing  intensity.  Her UCSD SOB Questionnaire was scored 15.  She knows to pace herself with activities.   Develop more efficient breathing techniques such as purse lipped breathing and diaphragmatic breathing; and practicing self-pacing with activity Yes   Intervention While in program, learn and utilize the specific breathing techniques taught to you. Continue to practice and use the techniques as needed.  Ms Jasinski does perform PLB, but does forget to do it at times.   Increase knowledge of respiratory medications and ability to use respiratory devices properly.  Yes   Intervention While in program learn and demonstrate appropriate use of your oxygen therapy by increasing flow with exertion, manage oxygen tank operation, including continuous and intermittent flow.  Understanding oxygen is a drug ordered by your physician.;While in program, learn to administer MDI, nebulizer, and spacer properly.;Learn to take respiratory medicine as ordered.;While in program, learn to Clean MDI, nebulizers, and spacers properly.  She has a good understanding of her Advair, Proventil, and spacer. She uses 2-4l/m of oxygen - adjusting the flows as needed; she has 2 portable tanks - one a concentrator and the other  [place].      Personal Goals and Risk Factors Review:      Goals and Risk Factor Review      12/18/14 1130 12/20/14  1130 12/27/14 1130 12/27/14 1513 01/08/15 1130   Increase Aerobic Exercise and Physical Activity   Goals Progress/Improvement seen      Yes   Comments     Ms Andrew states she has noticed an improvement in her stamina and endurance with her move into her new apartment. She is tired, but she has accomplished more physically than she expected due to [place].   Understand more about Heart/Pulmonary Disease   Goals Progress/Improvement seen  Yes   Yes    Comments Ms Belmares is planning to start a Pulmonary Fibrosis Support Group in the [place], [place] area. She has support from the  IPF Association and good educational material.   Ms Capano shared information with me about a germ free vaporizer for use at night, and information on a humidifier monitor which I can in turn share in my [facility] education session.    Improve shortness of breath with ADL's   Goals Progress/Improvement seen  Yes       Comments Ms Veloso states she already feels a difference with her breathing even after 5 sessions.       Breathing Techniques   Goals Progress/Improvement seen   Yes   Yes   Comments  Ms Zanella uses PLB during her exercise goals and during activity at home.   Ms Stroschein uses PLB with her exercise goals and activity at home. She also paces herself.   Increase knowledge of respiratory medications   Goals Progress/Improvement seen  Yes  Yes  Yes   Comments Increased Ms Freeman oxygen to 6l/m on the XR - discussed the importance of keeping her O2Sat's in the 90's during activity.  Reviewed technique for Albuterol MDI with spacer - good understanding and demonstration  Ms Sing is adjusting her oxygen independently with activites and use of her oximeter.     01/19/15 1418 01/29/15 1130 02/05/15 1130 02/07/15 1130 02/07/15 1224   Increase Aerobic Exercise and Physical Activity   Goals Progress/Improvement seen   Yes      Comments  Ms Kotas improved her mid 2md by 129f Minimal Importance Difference for Pulmonary Fibrosis is 78.72 to [id number]     Understand more about Heart/Pulmonary Disease   Goals Progress/Improvement seen   Yes Yes     Comments  Ms BalKirkers been introduced to 2 other [place] in [place] have PF and are interested in her new support group she is starting in October. Ms BalSalley Hews very pro active for PF. 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 She is very excited about this opportunity.     Improve shortness of breath with ADL's   Goals Progress/Improvement seen  Yes     Yes   Comments Ms. BalMailletates that she is feeling much stronger.  She is excited about setteling into her new appartment and using the gym at her complex.  She feels like she can do more now and has less SOB.    SOB is decreasing on the exercise equipment. She doesn't really notice SOB around the house and sometimes doesn't recognize when her 02 saturation is low, but she has noticed an ability to do more without feeling tired. 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 She is still on prednisone which is helping with the SOB.    Breathing Techniques   Goals Progress/Improvement seen  Yes   Comments     She is still practicing the deep breathing. It is easy for her to take slow exhales, but still difficult to take deep breaths in. She is continuing to work on her breathing techniques.   Increase knowledge of respiratory medications   Goals Progress/Improvement seen     Yes    Comments    Discussed with Ms Slaugh the advantage of using her oxygen cannula which is supplied by [employer] - this cannula has a small bubble at the nasal area which helps with high flows and  acts as a reservoir  to help with oxygenation.       Personal Goals Discharge (Final Personal Goals and Risk Factors Review):      Goals and Risk Factor Review - 02/07/15 1224    Improve shortness of breath with ADL's   Goals Progress/Improvement seen  Yes   Comments SOB is decreasing on the exercise equipment. She doesn't really notice SOB around the house and sometimes doesn't recognize when her 02 saturation is low, but she has noticed an ability to do more without feeling tired. 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 She is still on prednisone which is helping with the SOB.    Breathing Techniques   Goals Progress/Improvement seen  Yes   Comments She is still practicing the deep breathing. It is easy for her to take slow exhales, but still  difficult to take deep breaths in. She is continuing to work on her breathing techniques.       Comments: Madlynn is doing well in Pulm Rehab esp on the [id number] Recumbent elliptical

## 2015-02-26 ENCOUNTER — Encounter: Payer: 59 | Admitting: *Deleted

## 2015-02-26 DIAGNOSIS — J8410 Pulmonary fibrosis, unspecified: Secondary | ICD-10-CM | POA: Diagnosis not present

## 2015-02-26 NOTE — Progress Notes (Signed)
Daily Session Note  Patient Details  Name: Erika Martinez MRN: [id number] Date of Birth: 06-Oct-1955 Referring Provider:  Juanito Doom, MD  Encounter Date: 02/26/2015  Check In:     Session Check In - 02/26/15 1209    Check-In   Staff Present Candiss Norse MS, ACSM CEP Exercise Physiologist;Kelly Alfonso Patten, ACSM CEP Exercise Physiologist;Steven Way BS, ACSM EP-C, Exercise Physiologist   ER physicians immediately available to respond to emergencies [name] immediately available ER MD   Physician(s) Edd Fabian and Reita Cliche   Medication changes reported     No   Fall or balance concerns reported    No   Warm-up and Cool-down Performed on first and last piece of equipment   VAD Patient? No   Pain Assessment   Currently in Pain? No/denies   Multiple Pain Sites No           Exercise Prescription Changes - 02/26/15 1200    Exercise Review   Progression Yes   Resistance Training   Training Prescription Yes   Weight 2   Reps 10-12   Treadmill   MPH 1.7   Grade 0   Minutes 15   NuStep   Level 3   Watts 50   Minutes 15   REL-XR   Level 5   Watts 50   Minutes 15      Goals Met:  Proper associated with RPD/PD & O2 Sat Independence with exercise equipment Exercise tolerated well Personal goals reviewed Strength training completed today  Goals Unmet:  Not Applicable  Goals Comments: Progressing with exercise goals. Reviewed individualized exercise prescription and made increases per departmental policy. Exercise increases were discussed with the patient and they were able to perform the new work loads without issue (no signs or symptoms). [name]'s school is working with [employer], so she can continue attending [name].     Dr. Emily Filbert is Medical Director for [place] and [name].

## 2015-02-28 ENCOUNTER — Encounter: Payer: 59 | Admitting: *Deleted

## 2015-02-28 ENCOUNTER — Encounter: Payer: Self-pay | Admitting: Respiratory Therapy

## 2015-02-28 DIAGNOSIS — J8410 Pulmonary fibrosis, unspecified: Secondary | ICD-10-CM

## 2015-02-28 NOTE — Progress Notes (Signed)
Daily Session Note  Patient Details  Name: Erika Martinez MRN: [id number] Date of Birth: 12/13/1955 Referring :  Sowles, Krichna, MD  Encounter Date: 02/28/2015  Check In:     Session Check In - 02/28/15 1144    Check-In   Staff Present Laureen Brown BS, RRT, Respiratory Therapist;Renee MacMillan MS, ACSM CEP Exercise Physiologist;Steven Way BS, ACSM EP-C, Exercise Physiologist   ER physicians immediately available to respond to emergencies [name] immediately available ER MD   Physician(s) Williams and Goodman   Medication changes reported     No   Fall or balance concerns reported    No   Warm-up and Cool-down Performed on first and last piece of equipment   VAD Patient? No   Pain Assessment   Currently in Pain? No/denies   Multiple Pain Sites No         Goals Met:  Proper associated with RPD/PD & O2 Sat Independence with exercise equipment Using PLB without cueing & demonstrates good technique Exercise tolerated well Strength training completed today  Goals Unmet:  Not Applicable  Goals Comments: [name] is starting to get into the routine of working and coming to rehab and is adjusting to the energy demand of both. She is feeling good overall.    Dr. Mark Miller is Medical Director for [facility] and [name]. 

## 2015-03-05 DIAGNOSIS — J8410 Pulmonary fibrosis, unspecified: Secondary | ICD-10-CM | POA: Diagnosis not present

## 2015-03-05 NOTE — Progress Notes (Signed)
Daily Session Note  Patient Details  Name: TAI SYFERT MRN: [id number] Date of Birth: December 09, 1955 Referring Provider:  Juanito Doom, MD  Encounter Date: 03/05/2015  Check In:     Session Check In - 03/05/15 1249    Check-In   Staff Present Gerlene Burdock RN, BSN;Steven Way BS, ACSM EP-C, Exercise Physiologist;Laureen Janell Quiet, RRT, Respiratory Therapist   ER physicians immediately available to respond to emergencies [name] immediately available ER MD   Physician(s) quale and williams   Medication changes reported     No   Fall or balance concerns reported    No   Warm-up and Cool-down Performed on first and last piece of equipment   VAD Patient? No   Pain Assessment   Currently in Pain? No/denies         Goals Met:  Proper associated with RPD/PD & O2 Sat Exercise tolerated well No report of cardiac concerns or symptoms Strength training completed today  Goals Unmet:  Not Applicable  Goals Comments: Ms Mcconathy plans to graduate next Wednesday - her job has been working with her for several sessions.    Dr. Emily Filbert is Medical Director for [place] and [name].

## 2015-03-06 ENCOUNTER — Encounter: Payer: Self-pay | Admitting: Respiratory Therapy

## 2015-03-07 ENCOUNTER — Encounter: Payer: 59 | Admitting: Respiratory Therapy

## 2015-03-07 DIAGNOSIS — J8410 Pulmonary fibrosis, unspecified: Secondary | ICD-10-CM | POA: Diagnosis not present

## 2015-03-07 NOTE — Progress Notes (Signed)
Daily Session Note  Patient Details  Name: Erika Martinez MRN: [id number] Date of Birth: 1955/06/11 Referring Provider:  Juanito Doom, MD  Encounter Date: 03/07/2015  Check In:     Session Check In - 03/07/15 1130    Check-In   Staff Present Carson Myrtle BS, RRT, Respiratory Therapist;Renee Dillard Essex MS, ACSM CEP Exercise Physiologist   ER physicians immediately available to respond to emergencies [name] immediately available ER MD   Physician(s) Schaevit and Paduchowski         Goals Met:  Proper associated with RPD/PD & O2 Sat Independence with exercise equipment Using PLB without cueing & demonstrates good technique Exercise tolerated well Strength training completed today  Goals Unmet:  Not Applicable  Goals Comments: [name] has a start date for her Pulmonary Fibrosis Support Group. The hospital has given her a room for the meeting here in the hospital.   Dr. Emily Filbert is Medical Director for [place] and [name].

## 2015-03-09 ENCOUNTER — Encounter: Payer: 59 | Admitting: *Deleted

## 2015-03-09 DIAGNOSIS — J8410 Pulmonary fibrosis, unspecified: Secondary | ICD-10-CM

## 2015-03-09 NOTE — Progress Notes (Signed)
Daily Session Note  Patient Details  Name: LYNLEE STRATTON MRN: [id number] Date of Birth: [birth year] Referring Provider:  Juanito Doom, MD  Encounter Date: 03/09/2015  Check In:     Session Check In - 03/09/15 1214    Check-In   Staff Present Frederich Cha RRT, RCP Respiratory Therapist;Carroll Enterkin RN, Drusilla Kanner MS, ACSM CEP Exercise Physiologist   ER physicians immediately available to respond to emergencies [name] immediately available ER MD   Physician(s) Cinda Quest and Kinner   Medication changes reported     No   Fall or balance concerns reported    No   Warm-up and Cool-down Performed on first and last piece of equipment   VAD Patient? No   Pain Assessment   Currently in Pain? No/denies   Multiple Pain Sites No         Goals Met:  Proper associated with RPD/PD & O2 Sat Independence with exercise equipment Using PLB without cueing & demonstrates good technique Exercise tolerated well Strength training completed today  Goals Unmet:  Not Applicable  Goals Comments: Did well with all exercise activity today.    Dr. Emily Filbert is Medical Director for [place] and [name].

## 2015-03-12 ENCOUNTER — Encounter: Payer: 59 | Attending: Pulmonary Disease | Admitting: *Deleted

## 2015-03-12 VITALS — Ht 64.0 in | Wt 135.1 lb

## 2015-03-12 DIAGNOSIS — J8410 Pulmonary fibrosis, unspecified: Secondary | ICD-10-CM

## 2015-03-12 NOTE — Progress Notes (Signed)
Pulmonary Individual Treatment Plan  Patient Details  Name: Erika Martinez MRN: [id number] Date of Birth: Mar 05, 1956 Referring Provider:  Steele Sizer, MD  Initial Encounter Date:    Visit Diagnosis: Pulmonary fibrosis ([place])  Patient's Home Medications on Admission:  Current outpatient prescriptions:  .  albuterol (PROVENTIL HFA;VENTOLIN HFA) 108 (90 BASE) MCG/ACT inhaler, Inhale 2 puffs into the lungs every 4 (four) hours as needed for wheezing or shortness of breath., Disp: 8 g, Rfl: 5 .  ALPRAZolam (XANAX) 0.5 MG tablet, Take 1 tablet (0.5 mg total) by mouth 3 (three) times daily as needed for anxiety., Disp: 45 tablet, Rfl: 2 .  Azelastine-Fluticasone (DYMISTA) 137-50 MCG/ACT SUSP, Place into the nose., Disp: , Rfl:  .  cholecalciferol (VITAMIN D) 1000 UNITS tablet, Take 2,000 Units by mouth daily., Disp: , Rfl:  .  diltiazem (CARDIZEM) 60 MG tablet, Take 1 tablet (60 mg total) by mouth daily., Disp: 30 tablet, Rfl: 2 .  Fluticasone-Salmeterol (ADVAIR DISKUS) 500-50 MCG/DOSE AEPB, Inhale 1 puff into the lungs 2 (two) times daily., Disp: 60 each, Rfl: 5 .  Fluticasone-Salmeterol (ADVAIR DISKUS) 500-50 MCG/DOSE AEPB, Inhale into the lungs., Disp: , Rfl:  .  loratadine (CLARITIN) 10 MG tablet, TAKE 1 TABLET DAILY, Disp: 30 tablet, Rfl: 3 .  nystatin (MYCOSTATIN) 100000 UNIT/ML suspension, TAKE 1 TABLESPOON BY MOUTH BEFORE MEALS AND AT BEDTIME (SWISH AND SPIT OUT), Disp: , Rfl: 2 .  omeprazole (PRILOSEC) 40 MG capsule, Take 1 capsule (40 mg total) by mouth daily., Disp: 30 capsule, Rfl: 3 .  predniSONE (DELTASONE) 20 MG tablet, Take 1 tablet (20 mg total) by mouth daily., Disp: 30 tablet, Rfl: 3 .  vitamin B-12 (CYANOCOBALAMIN) 250 MCG tablet, Place 250 mcg under the tongue daily., Disp: , Rfl:   Past Medical History: Past Medical History  Diagnosis Date  . Asthma   . Mitral valve prolapse   . Seasonal allergies   . Hypertension   . Abnormal hemoglobin   . Plantar wart   .  Symptomatic menopausal or female climacteric states   . Scleroderma     Dr. Pennie Banter  . Pulmonary fibrosis   . Allergy   . Oxygen dependent   . Erythrocytosis   . Hep C w/o coma, chronic   . Lumbago   . Anxiety   . GERD (gastroesophageal reflux disease)   . IBS (irritable bowel syndrome)   . Controlled insomnia   . Spondylolisthesis     Tobacco Use: History  Smoking status  . Never Smoker   Smokeless tobacco  . Never Used    Labs: Recent Review Flowsheet Data    Labs for ITP Cardiac and Pulmonary Rehab Latest Ref Rng 07/14/2013   Cholestrol 0 - 200 mg/dL 169   LDLCALC - 95   HDL 35 - 70 mg/dL 58   Trlycerides 40 - 160 mg/dL 78       ADL UCSD:     ADL UCSD      12/05/14 0920 01/29/15 1130     ADL UCSD   ADL Phase Entry Mid    SOB Score total 15 19    Rest 0 0    Walk 0 1    Stairs 1 3    Bath 0 1    Dress 0 0    Shop 1 1        Pulmonary Function Assessment:     Pulmonary Function Assessment - 12/05/14 0920    Pulmonary Function Tests   FVC% 48 %  FEV1% 50 %   FEV1/FVC Ratio 63   DLCO% 54 %   Breath   Bilateral Breath Sounds Basilar;Rales   Shortness of Breath Yes      Exercise Target Goals:    Exercise Program Goal: Individual exercise prescription set with THRR, safety & activity barriers. Participant demonstrates ability to understand and report RPE using BORG scale, to self-measure pulse accurately, and to acknowledge the importance of the exercise prescription.  Exercise Prescription Goal: Starting with aerobic activity 30 plus minutes a day, 3 days per week for initial exercise prescription. Provide home exercise prescription and guidelines that participant acknowledges understanding prior to discharge.  Activity Barriers & Risk Stratification:     Activity Barriers & Risk Stratification - 12/05/14 0920    Activity Barriers & Risk Stratification   Activity Barriers None      6 Minute Walk:     6 Minute Walk      12/05/14  1341 01/29/15 1240     6 Minute Walk   Phase Initial Mid Program    Distance 830 feet 950 feet    Walk Time 6 minutes 6 minutes    Resting HR 72 bpm 94 bpm    Resting BP 132/72 mmHg 110/62 mmHg    Max Ex. HR 98 bpm 121 bpm    Max Ex. BP 146/78 mmHg 178/78 mmHg    RPE 15 16    Perceived Dyspnea  4 4    Symptoms No No       Initial Exercise Prescription:     Initial Exercise Prescription - 12/08/14 1100    Treadmill   MPH 1.5   Grade 0   Minutes 10   NuStep   Level 2   Watts 40   Minutes 15   REL-XR   Level 2   Watts 40   Minutes 10   Resistance Training   Training Prescription Yes   Weight 2   Reps 10-15      Exercise Prescription Changes:     Exercise Prescription Changes      12/19/14 0800 12/27/14 1200 01/01/15 1200 01/08/15 1200 01/17/15 1500   Exercise Review   Progression   Yes Yes Yes   Response to Exercise   Blood Pressure (Admit) --  Entry for 12/18/2014    110/68 mmHg   Blood Pressure (Exercise)     126/74 mmHg   Blood Pressure (Exit)     104/66 mmHg   Heart Rate (Admit)     61 bpm   Heart Rate (Exercise)     110 bpm   Heart Rate (Exit)     88 bpm   Oxygen Saturation (Admit)     100 %  4l/m   Oxygen Saturation (Exercise)     96 %  6l/m   Oxygen Saturation (Exit)     100 %   Rating of Perceived Exertion (Exercise)     12   Perceived Dyspnea (Exercise)     2   Resistance Training   Training Prescription     Yes   Weight     2   Reps     10-12   Treadmill   MPH    1.5 1.5   Grade    0 0   Minutes    15 15   NuStep   Level  _0 Watts  50 50 50 50   Minutes  _1 REL-XR  Level     3   Watts     50   Minutes   '15 15 15     '$ 01/22/15 1100 01/29/15 1100 02/26/15 1200 03/09/15 1200 03/12/15 1100   Exercise Review   Progression Yes Yes Yes Yes Yes   Response to Exercise   Blood Pressure (Admit) 110/68 mmHg 110/68 mmHg      Blood Pressure (Exercise) 126/74 mmHg 126/74 mmHg      Blood Pressure (Exit) 104/66 mmHg 104/66 mmHg       Heart Rate (Admit) 61 bpm 61 bpm      Heart Rate (Exercise) 110 bpm 110 bpm      Heart Rate (Exit) 88 bpm 88 bpm      Oxygen Saturation (Admit) 100 %  4l/m 100 %  4l/m      Oxygen Saturation (Exercise) 96 %  6l/m 96 %  6l/m      Oxygen Saturation (Exit) 100 % 100 %      Rating of Perceived Exertion (Exercise) 12 12      Perceived Dyspnea (Exercise) 2 2      Comments    Jessel worked before coming to class today and this always puts an increased drain on her energy. She definitely feels stronger than she did before the program but it continues to require a lot of effort from her to manage work and rehab.  Haruna worked before coming to class today and this always puts an increased drain on her energy. She definitely feels stronger than she did before the program but it continues to require a lot of effort from her to manage work and rehab.    Resistance Training   Training Prescription Yes Yes Yes Yes Yes   Weight $Remov'2 2 2 2 2   'NtBGxJ$ Reps 10-12 10-12 10-12 10-12 10-12   Treadmill   MPH 1.7 1.7 1.7 1.7 1.8   Grade 0 0 0 0 0   Minutes $Remove'15 15 15 15 15   'fEZxvpv$ NuStep   Level $Remo'3 3 3 3 3   'RFcDj$ Watts 50 50 50 50 50   Minutes $Remove'15 15 15 15 15   'mPCiIjA$ REL-XR   Level $Remo'3 4 5 5 5   'MIAyl$ Watts 50 55 50 50 50   Minutes $Remove'15 15 15 15 15      'nmjpKRd$ Discharge Exercise Prescription (Final Exercise Prescription Changes):     Exercise Prescription Changes - 03/12/15 1100    Exercise Review   Progression Yes   Response to Exercise   Comments Jordanne worked before coming to class today and this always puts an increased drain on her energy. She definitely feels stronger than she did before the program but it continues to require a lot of effort from her to manage work and rehab.    Resistance Training   Training Prescription Yes   Weight 2   Reps 10-12   Treadmill   MPH 1.8   Grade 0   Minutes 15   NuStep   Level 3   Watts 50   Minutes 15   REL-XR   Level 5   Watts 50   Minutes 15       Nutrition:  Target Goals:  Understanding of nutrition guidelines, daily intake of sodium '1500mg'$ , cholesterol '200mg'$ , calories 30% from fat and 7% or less from saturated fats, daily to have 5 or more servings of fruits and vegetables.  Biometrics:    Nutrition Therapy Plan and Nutrition Goals:  Nutrition Therapy & Goals - 12/05/14 0920    Nutrition Therapy   Diet Ms Bozich prefers not to meet with the dietitian; she manages her diet well with small portion sizes, limits sweets; drinks 6 glasses of water/day and 2 ginger ale soda/day; no caffeine.      Nutrition Discharge: Rate Your Plate Scores:   Psychosocial: Target Goals: Acknowledge presence or absence of depression, maximize coping skills, provide positive support system. Participant is able to verbalize types and ability to use techniques and skills needed for reducing stress and depression.  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    Comments --  Recently Ms Dike could not go on a yearly trip to [place] and was very diappointed. Her physician did not want her traveling due to recent treatment..   Family Dynamics   Good Support System? Yes  Ms Bin has good support from her friends and she is very involved in a IPF Support Group.   Barriers   Psychosocial barriers to participate in program The patient should benefit from training in stress management and relaxation.   Screening Interventions   Interventions Program counselor consult;Encouraged to exercise      Quality of Life Scores:     Quality of Life - 12/05/14 1052    Quality of Life  Scores   Health/Function Pre 17.22 %   Socioeconomic Pre 20.71 %   Psych/Spiritual Pre 24 %   Family Pre 22.8 %   GLOBAL Pre 20.07 %      PHQ-9:     Recent Review Flowsheet Data    Depression screen [facility] 2/9 02/02/2015 12/05/2014   Decreased Interest 0 1   Down, Depressed, Hopeless 0 0   PHQ - 2 Score 0 1      Psychosocial Evaluation and Intervention:     Psychosocial Evaluation - 12/20/14 1237    Psychosocial Evaluation & Interventions   Comments Counselor met with Ms. Kushnir today for initial psychosocial evaluation.  She has returned to this program due to shortness of breath.  She reports she stopped exercising since she was working so many hours and "regressed" as a result.  Her mood is reportedly better with no depressive symptoms currently.  She continues to struggle with some anxiety symptoms and takes her medication for this as needed.  Ms. Smaltz reports she is moving in the near future to a facility that has a 24 hour gym which will help her be more consistent in maintaining her goals once she completes this program.  Counselor will continue to follow with her as needed.     Continued Psychosocial Services Needed Yes  Follow up on mood and anxiety symptoms closer to her job starting back up for the fall.        Psychosocial Re-Evaluation:  Education: Education Goals: Education classes will be provided on a weekly basis, covering required topics. Participant will state understanding/return demonstration of topics presented.  Learning Barriers/Preferences:     Learning Barriers/Preferences - 12/05/14 0920    Learning Barriers/Preferences   Learning Barriers None   Learning Preferences Group Instruction;Individual Instruction;Pictoral;Skilled Demonstration;Verbal  Instruction;Video;Written Material      Education Topics: Initial Evaluation Education: - Verbal, written and demonstration of respiratory meds, RPE/PD scales, oximetry and breathing techniques.  Instruction on use of nebulizers and MDIs: cleaning and proper use, rinsing mouth with steroid doses and importance of monitoring MDI activations.          Pulmonary Rehab from 03/12/2015 in [place]   Date  12/05/14   Educator  LB   Instruction Review Code  2- meets goals/outcomes      General Nutrition Guidelines/Fats and Fiber: -Group instruction provided by verbal, written material, models and posters to present the general guidelines for heart healthy nutrition. Gives an explanation and review of dietary fats and fiber.      Pulmonary Rehab from 03/12/2015 in [employer] [employer]   Date  01/22/15   Educator  C. Joneen Caraway, RD   Instruction Review Code  2- meets goals/outcomes      Controlling Sodium/Reading Food Labels: -Group verbal and written material supporting the discussion of sodium use in heart healthy nutrition. Review and explanation with models, verbal and written materials for utilization of the food label.      Pulmonary Rehab from 03/12/2015 in [place]   Date  01/29/15   Educator  C. Joneen Caraway, RD   Instruction Review Code  2- meets goals/outcomes      Exercise Physiology & Risk Factors: - Group verbal and written instruction with models to review the exercise physiology of the cardiovascular system and associated critical values. Details cardiovascular disease risk factors and the goals associated with each risk factor.   Aerobic Exercise & Resistance Training: - Gives group verbal and written discussion on the health impact of inactivity. On the components of aerobic and resistive training programs and the benefits of this training and how to safely progress through these programs.   Flexibility, Balance, General Exercise Guidelines: - Provides group verbal and written instruction on the benefits of flexibility and balance training programs. Provides general  exercise guidelines with specific guidelines to those with heart or lung disease. Demonstration and skill practice provided.   Stress Management: - Provides group verbal and written instruction about the health risks of elevated stress, cause of high stress, and healthy ways to reduce stress.      Pulmonary Rehab from 03/12/2015 in [place]   Date  01/03/15   Educator  [facility]   Instruction Review Code  2- meets goals/outcomes      Depression: - Provides group verbal and written instruction on the correlation between heart/lung disease and depressed mood, treatment options, and the stigmas associated with seeking treatment.   Exercise & Equipment Safety: - Individual verbal instruction and demonstration of equipment use and safety with use of the equipment.      Pulmonary Rehab from 03/12/2015 in [place]   Date  03/12/15   Educator  L. Owens Shark, RRT   Instruction Review Code  2- meets goals/outcomes      Infection Prevention: - Provides verbal and written material to individual with discussion of infection control including proper hand washing and proper equipment cleaning during exercise session.      Pulmonary Rehab from 03/12/2015 in [place]   Date  12/08/14   Educator  [place]   Instruction Review Code  2- meets goals/outcomes      Falls Prevention: - Provides verbal and  written material to individual with discussion of falls prevention and safety.      Pulmonary Rehab from 03/12/2015 in [place]   Date  12/05/14   Educator  LB   Instruction Review Code  2- meets goals/outcomes      Diabetes: - Individual verbal and written instruction to review signs/symptoms of diabetes, desired ranges of glucose level fasting, after meals and with exercise. Advice that pre and post exercise glucose checks will be done for 3 sessions at  entry of program.   Chronic Lung Diseases: - Group verbal and written instruction to review new updates, new respiratory medications, new advancements in procedures and treatments. Provide informative websites and "800" numbers of self-education.   Lung Procedures: - Group verbal and written instruction to describe testing methods done to diagnose lung disease. Review the outcome of test results. Describe the treatment choices: Pulmonary Function Tests, ABGs and oximetry.      Pulmonary Rehab from 03/12/2015 in [place]   Date  01/12/15   Educator  sj   Instruction Review Code  2- meets goals/outcomes      Energy Conservation: - Provide group verbal and written instruction for methods to conserve energy, plan and organize activities. Instruct on pacing techniques, use of adaptive equipment and posture/positioning to relieve shortness of breath.      Pulmonary Rehab from 03/12/2015 in [place]   Date  01/24/15   Educator  SW   Instruction Review Code  2- meets goals/outcomes      Triggers: - Group verbal and written instruction to review types of environmental controls: home humidity, furnaces, filters, dust mite/pet prevention, HEPA vacuums. To discuss weather changes, air quality and the benefits of nasal washing.      Pulmonary Rehab from 03/12/2015 in [place]   Date  12/18/14   Educator  LB   Instruction Review Code  2- meets goals/outcomes      Exacerbations: - Group verbal and written instruction to provide: warning signs, infection symptoms, calling MD promptly, preventive modes, and value of vaccinations. Review: effective airway clearance, coughing and/or vibration techniques. Create an Sports administrator.   Oxygen: - Individual and group verbal and written instruction on oxygen therapy. Includes supplement oxygen, available portable oxygen systems, continuous  and intermittent flow rates, oxygen safety, concentrators, and Medicare reimbursement for oxygen.      Pulmonary Rehab from 03/12/2015 in [place]   Date  12/05/14   Educator  LB   Instruction Review Code  2- meets goals/outcomes      Respiratory Medications: - Group verbal and written instruction to review medications for lung disease. Drug class, frequency, complications, importance of spacers, rinsing mouth after steroid MDI's, and proper cleaning methods for nebulizers.      Pulmonary Rehab from 03/12/2015 in [place]   Date  12/22/14   Educator  [place]   Instruction Review Code  2- meets goals/outcomes [Spacer and instructions given to pt. ]      AED/CPR: - Group verbal and written instruction with the use of models to demonstrate the basic use of the AED with the basic ABC's of resuscitation.      Pulmonary Rehab from 03/12/2015 in [place]   Date  01/19/15   Educator  CE   Instruction Review Code  2- meets goals/outcomes  Breathing Retraining: - Provides individuals verbal and written instruction on purpose, frequency, and proper technique of diaphragmatic breathing and pursed-lipped breathing. Applies individual practice skills.      Pulmonary Rehab from 03/12/2015 in [place]   Date  12/05/14   Educator  LB   Instruction Review Code  2- meets goals/outcomes      Anatomy and Physiology of the Lungs: - Group verbal and written instruction with the use of models to provide basic lung anatomy and physiology related to function, structure and complications of lung disease.      Pulmonary Rehab from 03/12/2015 in [place]   Date  02/23/15   Educator  C. Nikolaus Pienta, RN   Instruction Review Code  2- meets goals/outcomes      Heart Failure: - Group verbal and written instruction on  the basics of heart failure: signs/symptoms, treatments, explanation of ejection fraction, enlarged heart and cardiomyopathy.      Pulmonary Rehab from 03/12/2015 in [place]   Date  12/29/14   Educator  [employer]   Instruction Review Code  2- meets goals/outcomes      Sleep Apnea: - Individual verbal and written instruction to review Obstructive Sleep Apnea. Review of risk factors, methods for diagnosing and types of masks and machines for OSA.   Anxiety: - Provides group, verbal and written instruction on the correlation between heart/lung disease and anxiety, treatment options, and management of anxiety.      Pulmonary Rehab from 03/12/2015 in [place]   Date  01/31/15   Educator  [facility]   Instruction Review Code  2- Meets goals/outcomes      Relaxation: - Provides group, verbal and written instruction about the benefits of relaxation for patients with heart/lung disease. Also provides patients with examples of relaxation techniques.   Knowledge Questionnaire Score:     Knowledge Questionnaire Score - 12/05/14 0920    Knowledge Questionnaire Score   Pre Score -2      Personal Goals and Risk Factors at Admission:     Personal Goals and Risk Factors at Admission - 12/05/14 1039    Personal Goals and Risk Factors on Admission    Weight Management No   Increase Aerobic Exercise and Physical Activity Yes   Intervention While in program, learn and follow the exercise prescription taught. Start at a low level workload and increase workload after able to maintain previous level for 30 minutes. Increase time before increasing intensity.  Ms Graefe was a member of [employer] and plans to return there after LW.   Quit Smoking No   Take Less Medication No   Understand more about Heart/Pulmonary Disease. Yes   Intervention While in program utilize professionals for any questions, and attend the education  sessions. Great websites to use are www.americanheart.org or www.lung.org for reliable information.  Ms Seneca is very motivated to learn new information on medical issues. She is planning to start a Pulmonary Support Group.   Improve shortness of breath with ADL's Yes   Intervention While in program, learn and follow the exercise prescription taught. Start at a low level workload and increase workload ad advised by the exercise physiologist. Increase time before increasing intensity.  Her UCSD SOB Questionnaire was scored 15.  She knows to pace herself with activities.   Develop more efficient breathing techniques such as purse lipped breathing and diaphragmatic breathing; and practicing self-pacing with activity Yes  Intervention While in program, learn and utilize the specific breathing techniques taught to you. Continue to practice and use the techniques as needed.  Ms Castell does perform PLB, but does forget to do it at times.   Increase knowledge of respiratory medications and ability to use respiratory devices properly.  Yes   Intervention While in program learn and demonstrate appropriate use of your oxygen therapy by increasing flow with exertion, manage oxygen tank operation, including continuous and intermittent flow.  Understanding oxygen is a drug ordered by your physician.;While in program, learn to administer MDI, nebulizer, and spacer properly.;Learn to take respiratory medicine as ordered.;While in program, learn to Clean MDI, nebulizers, and spacers properly.  She has a good understanding of her Advair, Proventil, and spacer. She uses 2-4l/m of oxygen - adjusting the flows as needed; she has 2 portable tanks - one a concentrator and the other  [place].      Personal Goals and Risk Factors Review:      Goals and Risk Factor Review      12/18/14 1130 12/20/14 1130 12/27/14 1130 12/27/14 1513 01/08/15 1130   Increase Aerobic Exercise and Physical Activity    Goals Progress/Improvement seen      Yes   Comments     Ms Jaffee states she has noticed an improvement in her stamina and endurance with her move into her new apartment. She is tired, but she has accomplished more physically than she expected due to [place].   Understand more about Heart/Pulmonary Disease   Goals Progress/Improvement seen  Yes   Yes    Comments Ms Menser is planning to start a Pulmonary Fibrosis Support Group in the [place], [place] area. She has support from the IPF [employer] and good educational material.   Ms Susi shared information with me about a germ free vaporizer for use at night, and information on a humidifier monitor which I can in turn share in my Trigger education session.    Improve shortness of breath with ADL's   Goals Progress/Improvement seen  Yes       Comments Ms Woldt states she already feels a difference with her breathing even after 5 sessions.       Breathing Techniques   Goals Progress/Improvement seen   Yes   Yes   Comments  Ms Masse uses PLB during her exercise goals and during activity at home.   Ms [place] uses PLB with her exercise goals and activity at home. She also paces herself.   Increase knowledge of respiratory medications   Goals Progress/Improvement seen  Yes  Yes  Yes   Comments Increased Ms Narvaiz oxygen to 6l/m on the XR - discussed the importance of keeping her O2Sat's in the 90's during activity.  Reviewed technique for Albuterol MDI with spacer - good understanding and demonstration  Ms Zbikowski is adjusting her oxygen independently with activites and use of her oximeter.     01/19/15 1418 01/29/15 1130 02/05/15 1130 02/07/15 1130 02/07/15 1224   Increase Aerobic Exercise and Physical Activity   Goals Progress/Improvement seen   Yes      Comments  Ms Tigue improved her mid 35mwd by 176ft. Minimal Importance Difference for Pulmonary Fibrosis is 78.72 to 147.58ft.      Understand more about Heart/Pulmonary Disease   Goals  Progress/Improvement seen   Yes Yes     Comments  Ms Montville has been introduced to 2 other participants in [place] who have PF and are interested in  her new support group she is starting in October. Ms Salley Hews is very pro active for PF. 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 She is very excited about this opportunity.     Improve shortness of breath with ADL's   Goals Progress/Improvement seen  Yes    Yes   Comments Ms. Cumberland states that she is feeling much stronger.  She is excited about setteling into her new appartment and using the gym at her complex.  She feels like she can do more now and has less SOB.    SOB is decreasing on the exercise equipment. She doesn't really notice SOB around the house and sometimes doesn't recognize when her 02 saturation is low, but she has noticed an ability to do more without feeling tired. 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 She is still on prednisone which is helping with the SOB.    Breathing Techniques   Goals Progress/Improvement seen      Yes   Comments     She is still practicing the deep breathing. It is easy for her to take slow exhales, but still difficult to take deep breaths in. She is continuing to work on her breathing techniques.   Increase knowledge of respiratory medications   Goals Progress/Improvement seen     Yes    Comments    Discussed with Ms Elford the advantage of using her oxygen cannula which is supplied by [employer] - this cannula has a small bubble at the nasal area which helps with high flows and  acts as a reservoir  to help with oxygenation.      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 This is her second year in a row for this event. She has such a  passion for spreading public awareness of IPF! Ms Bardon has a good understanding of her Pulmonary Fibrosis. She has activily increased her knowledge of the disease, has participated in the [employer], plans to start a PF Support Group here in [place], [place] a. Ms Popovich is such an inspiration for for patients with chronic lung disease!      Improve shortness of breath with ADL's   Goals Progress/Improvement seen   Yes      Comments  Ms Mayon  manages her shortness of breath with pacing, PLB, and use of her oxygen. She is a very active person and has noticed the ability to do more without feeling tired.      Breathing Techniques   Goals Progress/Improvement seen   Yes      Comments  Good technique with her PLB - it is extremely important for her activity.      Increase knowledge of respiratory medications   Goals Progress/Improvement seen   Yes      Comments  Ms Pourciau has a good understanding of her Advair, Proventil, and spacer. She uses 2-6l/m [place] and adjusts the flow appropriately. Ms Braddy knows the importance of these medications for her treatment of Pulmonary Fibrosis.         Personal Goals Discharge (Final Personal Goals and Risk Factors Review):      Goals and Risk  Factor Review - 03/05/15 1000    Understand more about Heart/Pulmonary Disease   Goals Progress/Improvement seen  Yes   Comments Ms Nicosia has a good understanding of her Pulmonary Fibrosis. She has activily increased her knowledge of the disease, has participated in the [employer], plans to start a PF Support Group here in [place], [place] a. Ms Losier is such an inspiration for for patients with chronic lung disease!   Improve shortness of breath with ADL's   Goals Progress/Improvement seen  Yes   Comments Ms Noguchi  manages her shortness of breath with pacing, PLB, and use of her oxygen. She is a very active person and has noticed the ability to do more without feeling tired.    Breathing Techniques   Goals Progress/Improvement seen  Yes   Comments Good technique with her PLB - it is extremely important for her activity.   Increase knowledge of respiratory medications   Goals Progress/Improvement seen  Yes   Comments Ms Reedy has a good understanding of her Advair, Proventil, and spacer. She uses 2-6l/m [place] and adjusts the flow appropriately. Ms Henault knows the importance of these medications for her treatment of Pulmonary Fibrosis.      Comments: Did 6 minute walk today after she had to rest since she said she takes her [employer] children out to play for 1/2 hour from 9:45am-10:15 am

## 2015-03-12 NOTE — Progress Notes (Signed)
Daily Session Note  Patient Details  Name: Erika Martinez MRN: [id number] Date of Birth: 07/10/1955 Referring Provider:  Steele Sizer, MD  Encounter Date: 03/12/2015  Check In:     Session Check In - 03/12/15 1154    Check-In   Staff Present Carson Myrtle BS, RRT, Respiratory Therapist;Steven Way BS, ACSM EP-C, Exercise Physiologist;Mckenzy Salazar RN, BSN   ER physicians immediately available to respond to emergencies [name] immediately available ER MD   Physician(s) Dr. Cathie Olden, Dr. Corky Downs   Medication changes reported     No   Fall or balance concerns reported    No   Warm-up and Cool-down Performed on first and last piece of equipment   VAD Patient? No   Pain Assessment   Currently in Pain? No/denies           Exercise Prescription Changes - 03/12/15 1100    Exercise Review   Progression Yes   Response to Exercise   Comments Kerry-Anne worked before coming to class today and this always puts an increased drain on her energy. She definitely feels stronger than she did before the program but it continues to require a lot of effort from her to manage work and rehab.    Resistance Training   Training Prescription Yes   Weight 2   Reps 10-12   Treadmill   MPH 1.8   Grade 0   Minutes 15   NuStep   Level 3   Watts 50   Minutes 15   REL-XR   Level 5   Watts 50   Minutes 15      Goals Met:  Proper associated with RPD/PD & O2 Sat Exercise tolerated well  Goals Unmet:  Not Applicable  Goals Comments: Did 6 minute walk today after she had to rest since she said she takes her [employer] children out to play for 1/2 hour from 9:45am-10:15 am.    Dr. Emily Filbert is Medical Director for [place] and [name].

## 2015-03-14 ENCOUNTER — Encounter: Payer: 59 | Admitting: *Deleted

## 2015-03-14 ENCOUNTER — Encounter: Payer: Self-pay | Admitting: Respiratory Therapy

## 2015-03-14 DIAGNOSIS — J8410 Pulmonary fibrosis, unspecified: Secondary | ICD-10-CM | POA: Diagnosis not present

## 2015-03-14 NOTE — Progress Notes (Signed)
Pulmonary Individual Treatment Plan  Patient Details  Name: Erika Martinez MRN: [id number] Date of Birth: 1956/02/06 Referring Provider:  Dr Simonne Maffucci  Initial Encounter Date: 12/05/2014  Visit Diagnosis: Pulmonary fibrosis ([place])  Patient's Home Medications on Admission:  Current outpatient prescriptions:    albuterol (PROVENTIL HFA;VENTOLIN HFA) 108 (90 BASE) MCG/ACT inhaler, Inhale 2 puffs into the lungs every 4 (four) hours as needed for wheezing or shortness of breath., Disp: 8 g, Rfl: 5   ALPRAZolam (XANAX) 0.5 MG tablet, Take 1 tablet (0.5 mg total) by mouth 3 (three) times daily as needed for anxiety., Disp: 45 tablet, Rfl: 2   Azelastine-Fluticasone (DYMISTA) 137-50 MCG/ACT SUSP, Place into the nose., Disp: , Rfl:    cholecalciferol (VITAMIN D) 1000 UNITS tablet, Take 2,000 Units by mouth daily., Disp: , Rfl:    diltiazem (CARDIZEM) 60 MG tablet, Take 1 tablet (60 mg total) by mouth daily., Disp: 30 tablet, Rfl: 2   Fluticasone-Salmeterol (ADVAIR DISKUS) 500-50 MCG/DOSE AEPB, Inhale 1 puff into the lungs 2 (two) times daily., Disp: 60 each, Rfl: 5   Fluticasone-Salmeterol (ADVAIR DISKUS) 500-50 MCG/DOSE AEPB, Inhale into the lungs., Disp: , Rfl:    loratadine (CLARITIN) 10 MG tablet, TAKE 1 TABLET DAILY, Disp: 30 tablet, Rfl: 3   nystatin (MYCOSTATIN) 100000 UNIT/ML suspension, TAKE 1 TABLESPOON BY MOUTH BEFORE MEALS AND AT BEDTIME (SWISH AND SPIT OUT), Disp: , Rfl: 2   omeprazole (PRILOSEC) 40 MG capsule, Take 1 capsule (40 mg total) by mouth daily., Disp: 30 capsule, Rfl: 3   predniSONE (DELTASONE) 20 MG tablet, Take 1 tablet (20 mg total) by mouth daily., Disp: 30 tablet, Rfl: 3   vitamin B-12 (CYANOCOBALAMIN) 250 MCG tablet, Place 250 mcg under the tongue daily., Disp: , Rfl:   Past Medical History: Past Medical History  Diagnosis Date   Asthma    Mitral valve prolapse    Seasonal allergies    Hypertension    Abnormal hemoglobin    Plantar wart     Symptomatic menopausal or female climacteric states    Scleroderma     Dr. Pennie Banter   Pulmonary fibrosis    Allergy    Oxygen dependent    Erythrocytosis    Hep C w/o coma, chronic    Lumbago    Anxiety    GERD (gastroesophageal reflux disease)    IBS (irritable bowel syndrome)    Controlled insomnia    Spondylolisthesis     Tobacco Use: History  Smoking status   Never Smoker   Smokeless tobacco   Never Used    Labs: Recent Review Flowsheet Data    Labs for ITP Cardiac and Pulmonary Rehab Latest Ref Rng 07/14/2013   Cholestrol 0 - 200 mg/dL 169   LDLCALC - 95   HDL 35 - 70 mg/dL 58   Trlycerides 40 - 160 mg/dL 78       ADL UCSD:     ADL UCSD      12/05/14 0920 01/29/15 1130 03/14/15 1130   ADL UCSD   ADL Phase Entry Mid Exit   SOB Score total '15 19 26   ' Rest 0 0 0   Walk 0 1 2   Stairs '1 3 3   ' Bath 0 1 1   Dress 0 0 0   Shop 1 1 0       Pulmonary Function Assessment:     Pulmonary Function Assessment - 12/05/14 0920    Pulmonary Function Tests   FVC% 48 %  FEV1% 50 %   FEV1/FVC Ratio 63   DLCO% 54 %   Breath   Bilateral Breath Sounds Basilar;Rales   Shortness of Breath Yes      Exercise Target Goals:    Exercise Program Goal: Individual exercise prescription set with THRR, safety & activity barriers. Participant demonstrates ability to understand and report RPE using BORG scale, to self-measure pulse accurately, and to acknowledge the importance of the exercise prescription.  Exercise Prescription Goal: Starting with aerobic activity 30 plus minutes a day, 3 days per week for initial exercise prescription. Provide home exercise prescription and guidelines that participant acknowledges understanding prior to discharge.  Activity Barriers & Risk Stratification:     Activity Barriers & Risk Stratification - 12/05/14 0920    Activity Barriers & Risk Stratification   Activity Barriers None      6 Minute Walk:     6  Minute Walk      12/05/14 1341 01/29/15 1240 03/12/15 1409   6 Minute Walk   Phase Initial Mid Program Discharge   Distance 830 feet 950 feet 985 feet   Distance % Change   19 %   Walk Time 6 minutes 6 minutes 6 minutes   Resting HR 72 bpm 94 bpm 102 bpm   Resting BP 132/72 mmHg 110/62 mmHg 138/74 mmHg   Max Ex. HR 98 bpm 121 bpm 117 bpm   Max Ex. BP 146/78 mmHg 178/78 mmHg 152/74 mmHg   RPE '15 16 16   ' Perceived Dyspnea  '4 4 5   ' Symptoms No No No      Initial Exercise Prescription:     Initial Exercise Prescription - 12/08/14 1100    Treadmill   MPH 1.5   Grade 0   Minutes 10   NuStep   Level 2   Watts 40   Minutes 15   REL-XR   Level 2   Watts 40   Minutes 10   Resistance Training   Training Prescription Yes   Weight 2   Reps 10-15      Exercise Prescription Changes:     Exercise Prescription Changes      12/19/14 0800 12/27/14 1200 01/01/15 1200 01/08/15 1200 01/17/15 1500   Exercise Review   Progression   Yes Yes Yes   Response to Exercise   Blood Pressure (Admit) --  Entry for 12/18/2014    110/68 mmHg   Blood Pressure (Exercise)     126/74 mmHg   Blood Pressure (Exit)     104/66 mmHg   Heart Rate (Admit)     61 bpm   Heart Rate (Exercise)     110 bpm   Heart Rate (Exit)     88 bpm   Oxygen Saturation (Admit)     100 %  4l/m   Oxygen Saturation (Exercise)     96 %  6l/m   Oxygen Saturation (Exit)     100 %   Rating of Perceived Exertion (Exercise)     12   Perceived Dyspnea (Exercise)     2   Resistance Training   Training Prescription     Yes   Weight     2   Reps     10-12   Treadmill   MPH    1.5 1.5   Grade    0 0   Minutes    15 15   NuStep   Level  '3 3 3 3   ' Watts  50 50  50 50   Minutes  '15 15 15 15   ' REL-XR   Level     3   Watts     50   Minutes   '15 15 15     ' 01/22/15 1100 01/29/15 1100 02/26/15 1200 03/09/15 1200 03/12/15 1100   Exercise Review   Progression Yes Yes Yes Yes Yes   Response to Exercise   Blood Pressure  (Admit) 110/68 mmHg 110/68 mmHg   138/74 mmHg   Blood Pressure (Exercise) 126/74 mmHg 126/74 mmHg   152/74 mmHg  BP with post 66md   Blood Pressure (Exit) 104/66 mmHg 104/66 mmHg   114/62 mmHg   Heart Rate (Admit) 61 bpm 61 bpm   102 bpm   Heart Rate (Exercise) 110 bpm 110 bpm   101 bpm   Heart Rate (Exit) 88 bpm 88 bpm   87 bpm   Oxygen Saturation (Admit) 100 %  4l/m 100 %  4l/m   96 %   Oxygen Saturation (Exercise) 96 %  6l/m 96 %  6l/m   98 %  6l/m   Oxygen Saturation (Exit) 100 % 100 %   100 %   Rating of Perceived Exertion (Exercise) '12 12   12   ' Perceived Dyspnea (Exercise) '2 2   2   ' Comments    DYarisaworked before coming to class today and this always puts an increased drain on her energy. She definitely feels stronger than she did before the program but it continues to require a lot of effort from her to manage work and rehab.  DIvaleeworked before coming to class today and this always puts an increased drain on her energy. She definitely feels stronger than she did before the program but it continues to require a lot of effort from her to manage work and rehab.    Frequency     Add 1 additional day to program exercise sessions.   Duration     Progress to 50 minutes of aerobic without signs/symptoms of physical distress   Intensity     Rest + 30   Progression     Continue progressive overload as per policy without signs/symptoms or physical distress.   Resistance Training   Training Prescription Yes Yes Yes Yes Yes   Weight '2 2 2 2 3   ' Reps 10-12 10-12 10-12 10-12 10-12   Treadmill   MPH 1.7 1.7 1.7 1.7 1.8   Grade 0 0 0 0 0   Minutes '15 15 15 15 15   ' NuStep   Level '3 3 3 3 3   ' Watts 50 50 50 50 50   Minutes '15 15 15 15 15   ' REL-XR   Level '3 4 5 5 5   ' Watts 50 55 50 50 50   Minutes '15 15 15 15 15   ' Home Exercise Plan   Plans to continue exercise at     Home  [place] - exercise there.      Discharge Exercise Prescription (Final Exercise  Prescription Changes):     Exercise Prescription Changes - 03/12/15 1100    Exercise Review   Progression Yes   Response to Exercise   Blood Pressure (Admit) 138/74 mmHg   Blood Pressure (Exercise) 152/74 mmHg  BP with post 652m   Blood Pressure (Exit) 114/62 mmHg   Heart Rate (Admit) 102 bpm   Heart Rate (Exercise) 101 bpm   Heart Rate (Exit) 87 bpm  Oxygen Saturation (Admit) 96 %   Oxygen Saturation (Exercise) 98 %  6l/m   Oxygen Saturation (Exit) 100 %   Rating of Perceived Exertion (Exercise) 12   Perceived Dyspnea (Exercise) 2   Comments Kymber worked before coming to class today and this always puts an increased drain on her energy. She definitely feels stronger than she did before the program but it continues to require a lot of effort from her to manage work and rehab.    Frequency Add 1 additional day to program exercise sessions.   Duration Progress to 50 minutes of aerobic without signs/symptoms of physical distress   Intensity Rest + 30   Progression Continue progressive overload as per policy without signs/symptoms or physical distress.   Resistance Training   Training Prescription Yes   Weight 3   Reps 10-12   Treadmill   MPH 1.8   Grade 0   Minutes 15   NuStep   Level 3   Watts 50   Minutes 15   REL-XR   Level 5   Watts 50   Minutes 15   Home Exercise Plan   Plans to continue exercise at Home  Ms [place] apartment complex has a gym - exercise there.       Nutrition:  Target Goals: Understanding of nutrition guidelines, daily intake of sodium <1578m, cholesterol <2049m calories 30% from fat and 7% or less from saturated fats, daily to have 5 or more servings of fruits and vegetables.  Biometrics:      Post Biometrics - 03/12/15 1411     Post  Biometrics   Height '5\' 4"'  (1.626 m)   Weight 135 lb 1.6 oz (61.281 kg)   Waist Circumference 34.5 inches   Hip Circumference 36.5 inches   Waist to Hip Ratio 0.95 %   BMI (Calculated) 23.2       Nutrition Therapy Plan and Nutrition Goals:     Nutrition Therapy & Goals - 12/05/14 0920    Nutrition Therapy   Diet Ms BaSchreursrefers not to meet with the dietitian; she manages her diet well with small portion sizes, limits sweets; drinks 6 glasses of water/day and 2 ginger ale soda/day; no caffeine.      Nutrition Discharge: Rate Your Plate Scores:   Psychosocial: Target Goals: Acknowledge presence or absence of depression, maximize coping skills, provide positive support system. Participant is able to verbalize types and ability to use techniques and skills needed for reducing stress and depression.  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 Her physician did not want her traveling due to recent treatment..   Family Dynamics   Good Support System? Yes  Ms BaMeckesas good support from her friends and she is very involved in a IPF Support Group.   Barriers   Psychosocial barriers to participate in program The patient should benefit from training in stress management and relaxation.   Screening Interventions   Interventions Program counselor consult;Encouraged to exercise  Quality of Life Scores:     Quality of Life - 03/14/15 1130    Quality of Life Scores   Health/Function Post 22.25 %   Health/Function % Change 29.22 %   Socioeconomic Post 27.57 %   Socioeconomic % Change 33.1 %   Psych/Spiritual Post 25.71 %   Psych/Spiritual % Change 7.14 %   Family Post 28.5  %   Family % Change 25 %   GLOBAL Post 24.79 %   GLOBAL % Change 23.53 %      PHQ-9:     Recent Review Flowsheet Data    Depression screen [facility] 2/9 03/14/2015 02/02/2015 12/05/2014   Decreased Interest 0 0 1   Down, Depressed, Hopeless 0 0 0   PHQ - 2 Score 0 0 1      Psychosocial Evaluation and Intervention:     Psychosocial Evaluation - 12/20/14 1237    Psychosocial Evaluation & Interventions   Comments Counselor met with Ms. Rump today for initial psychosocial evaluation.  She has returned to this program due to shortness of breath.  She reports she stopped exercising since she was working so many hours and "regressed" as a result.  Her mood is reportedly better with no depressive symptoms currently.  She continues to struggle with some anxiety symptoms and takes her medication for this as needed.  Ms. Trefry reports she is moving in the near future to a facility that has a 24 hour gym which will help her be more consistent in maintaining her goals once she completes this program.  Counselor will continue to follow with her as needed.     Continued Psychosocial Services Needed Yes  Follow up on mood and anxiety symptoms closer to her job starting back up for the fall.        Psychosocial Re-Evaluation:  Education: Education Goals: Education classes will be provided on a weekly basis, covering required topics. Participant will state understanding/return demonstration of topics presented.  Learning Barriers/Preferences:     Learning Barriers/Preferences - 12/05/14 0920    Learning Barriers/Preferences   Learning Barriers None   Learning Preferences Group Instruction;Individual Instruction;Pictoral;Skilled Demonstration;Verbal Instruction;Video;Written Material      Education Topics: Initial Evaluation Education: - Verbal, written and demonstration of respiratory meds, RPE/PD scales, oximetry and breathing techniques. Instruction on use of nebulizers and MDIs: cleaning  and proper use, rinsing mouth with steroid doses and importance of monitoring MDI activations.          Pulmonary Rehab from 03/12/2015 in [place]   Date  12/05/14   Educator  LB   Instruction Review Code  2- meets goals/outcomes      General Nutrition Guidelines/Fats and Fiber: -Group instruction provided by verbal, written material, models and posters to present the general guidelines for heart healthy nutrition. Gives an explanation and review of dietary fats and fiber.      Pulmonary Rehab from 03/12/2015 in [place]   Date  01/22/15   Educator  C. Joneen Caraway, RD   Instruction Review Code  2- meets goals/outcomes      Controlling Sodium/Reading Food Labels: -Group verbal and written material supporting the discussion of sodium use in heart healthy nutrition. Review and explanation with models, verbal and written materials for utilization of the food label.      Pulmonary Rehab from 03/12/2015 in [place]   Date  01/29/15   Educator  C. Joneen Caraway, [place]  Instruction Review Code  2- meets goals/outcomes      Exercise Physiology & Risk Factors: - Group verbal and written instruction with models to review the exercise physiology of the cardiovascular system and associated critical values. Details cardiovascular disease risk factors and the goals associated with each risk factor.   Aerobic Exercise & Resistance Training: - Gives group verbal and written discussion on the health impact of inactivity. On the components of aerobic and resistive training programs and the benefits of this training and how to safely progress through these programs.   Flexibility, Balance, General Exercise Guidelines: - Provides group verbal and written instruction on the benefits of flexibility and balance training programs. Provides general exercise guidelines with specific guidelines to those  with heart or lung disease. Demonstration and skill practice provided.   Stress Management: - Provides group verbal and written instruction about the health risks of elevated stress, cause of high stress, and healthy ways to reduce stress.      Pulmonary Rehab from 03/12/2015 in [place]   Date  01/03/15   Educator  [facility]   Instruction Review Code  2- meets goals/outcomes      Depression: - Provides group verbal and written instruction on the correlation between heart/lung disease and depressed mood, treatment options, and the stigmas associated with seeking treatment.   Exercise & Equipment Safety: - Individual verbal instruction and demonstration of equipment use and safety with use of the equipment.      Pulmonary Rehab from 03/12/2015 in [place]   Date  03/12/15   Educator  L. Owens Shark, RRT   Instruction Review Code  2- meets goals/outcomes      Infection Prevention: - Provides verbal and written material to individual with discussion of infection control including proper hand washing and proper equipment cleaning during exercise session.      Pulmonary Rehab from 03/12/2015 in [place]   Date  12/08/14   Educator  [place]   Instruction Review Code  2- meets goals/outcomes      Falls Prevention: - Provides verbal and written material to individual with discussion of falls prevention and safety.      Pulmonary Rehab from 03/12/2015 in [place]   Date  12/05/14   Educator  LB   Instruction Review Code  2- meets goals/outcomes      Diabetes: - Individual verbal and written instruction to review signs/symptoms of diabetes, desired ranges of glucose level fasting, after meals and with exercise. Advice that pre and post exercise glucose checks will be done for 3 sessions at entry of program.   Chronic Lung Diseases: - Group  verbal and written instruction to review new updates, new respiratory medications, new advancements in procedures and treatments. Provide informative websites and "800" numbers of self-education.   Lung Procedures: - Group verbal and written instruction to describe testing methods done to diagnose lung disease. Review the outcome of test results. Describe the treatment choices: Pulmonary Function Tests, ABGs and oximetry.      Pulmonary Rehab from 03/12/2015 in [place]   Date  01/12/15   Educator  sj   Instruction Review Code  2- meets goals/outcomes      Energy Conservation: - Provide group verbal and written instruction for methods to conserve energy, plan and organize activities. Instruct on pacing techniques, use of adaptive equipment and posture/positioning to relieve shortness of breath.  Pulmonary Rehab from 03/12/2015 in [place]   Date  01/24/15   Educator  SW   Instruction Review Code  2- meets goals/outcomes      Triggers: - Group verbal and written instruction to review types of environmental controls: home humidity, furnaces, filters, dust mite/pet prevention, HEPA vacuums. To discuss weather changes, air quality and the benefits of nasal washing.      Pulmonary Rehab from 03/12/2015 in [place]   Date  12/18/14   Educator  LB   Instruction Review Code  2- meets goals/outcomes      Exacerbations: - Group verbal and written instruction to provide: warning signs, infection symptoms, calling MD promptly, preventive modes, and value of vaccinations. Review: effective airway clearance, coughing and/or vibration techniques. Create an Sports administrator.   Oxygen: - Individual and group verbal and written instruction on oxygen therapy. Includes supplement oxygen, available portable oxygen systems, continuous and intermittent flow rates, oxygen safety,  concentrators, and Medicare reimbursement for oxygen.      Pulmonary Rehab from 03/12/2015 in [place]   Date  12/05/14   Educator  LB   Instruction Review Code  2- meets goals/outcomes      Respiratory Medications: - Group verbal and written instruction to review medications for lung disease. Drug class, frequency, complications, importance of spacers, rinsing mouth after steroid MDI's, and proper cleaning methods for nebulizers.      Pulmonary Rehab from 03/12/2015 in [place]   Date  12/22/14   Educator  [place]   Instruction Review Code  2- meets goals/outcomes [Spacer and instructions given to pt. ]      AED/CPR: - Group verbal and written instruction with the use of models to demonstrate the basic use of the AED with the basic ABC's of resuscitation.      Pulmonary Rehab from 03/12/2015 in [place]   Date  01/19/15   Educator  CE   Instruction Review Code  2- meets goals/outcomes      Breathing Retraining: - Provides individuals verbal and written instruction on purpose, frequency, and proper technique of diaphragmatic breathing and pursed-lipped breathing. Applies individual practice skills.      Pulmonary Rehab from 03/12/2015 in [place]   Date  12/05/14   Educator  LB   Instruction Review Code  2- meets goals/outcomes      Anatomy and Physiology of the Lungs: - Group verbal and written instruction with the use of models to provide basic lung anatomy and physiology related to function, structure and complications of lung disease.      Pulmonary Rehab from 03/12/2015 in [place]   Date  02/23/15   Educator  C. Enterkin, RN   Instruction Review Code  2- meets goals/outcomes      Heart Failure: - Group verbal and written instruction on the basics of heart failure: signs/symptoms,  treatments, explanation of ejection fraction, enlarged heart and cardiomyopathy.      Pulmonary Rehab from 03/12/2015 in [place]   Date  12/29/14   Educator  [place]   Instruction Review Code  2- meets goals/outcomes      Sleep Apnea: - Individual verbal and written instruction to review Obstructive Sleep Apnea. Review of risk factors, methods for diagnosing and types of masks and machines for OSA.   Anxiety: -  Provides group, verbal and written instruction on the correlation between heart/lung disease and anxiety, treatment options, and management of anxiety.      Pulmonary Rehab from 03/12/2015 in [place]   Date  01/31/15   Educator  [facility]   Instruction Review Code  2- Meets goals/outcomes      Relaxation: - Provides group, verbal and written instruction about the benefits of relaxation for patients with heart/lung disease. Also provides patients with examples of relaxation techniques.   Knowledge Questionnaire Score:     Knowledge Questionnaire Score - 12/05/14 0920    Knowledge Questionnaire Score   Pre Score -2      Personal Goals and Risk Factors at Admission:     Personal Goals and Risk Factors at Admission - 12/05/14 1039    Personal Goals and Risk Factors on Admission    Weight Management No   Increase Aerobic Exercise and Physical Activity Yes   Intervention While in program, learn and follow the exercise prescription taught. Start at a low level workload and increase workload after able to maintain previous level for 30 minutes. Increase time before increasing intensity.  Ms Spillers was a member of [facility] and plans to return there after LW.   Quit Smoking No   Take Less Medication No   Understand more about Heart/Pulmonary Disease. Yes   Intervention While in program utilize professionals for any questions, and attend the education sessions. Great websites to use are  www.americanheart.org or www.lung.org for reliable information.  Ms Houdek is very motivated to learn new information on medical issues. She is planning to start a Pulmonary Support Group.   Improve shortness of breath with ADL's Yes   Intervention While in program, learn and follow the exercise prescription taught. Start at a low level workload and increase workload ad advised by the exercise physiologist. Increase time before increasing intensity.  Her UCSD SOB Questionnaire was scored 15.  She knows to pace herself with activities.   Develop more efficient breathing techniques such as purse lipped breathing and diaphragmatic breathing; and practicing self-pacing with activity Yes   Intervention While in program, learn and utilize the specific breathing techniques taught to you. Continue to practice and use the techniques as needed.  Ms Goding does perform PLB, but does forget to do it at times.   Increase knowledge of respiratory medications and ability to use respiratory devices properly.  Yes   Intervention While in program learn and demonstrate appropriate use of your oxygen therapy by increasing flow with exertion, manage oxygen tank operation, including continuous and intermittent flow.  Understanding oxygen is a drug ordered by your physician.;While in program, learn to administer MDI, nebulizer, and spacer properly.;Learn to take respiratory medicine as ordered.;While in program, learn to Clean MDI, nebulizers, and spacers properly.  She has a good understanding of her Advair, Proventil, and spacer. She uses 2-4l/m of oxygen - adjusting the flows as needed; she has 2 portable tanks - one a concentrator and the other  [place].      Personal Goals and Risk Factors Review:      Goals and Risk Factor Review      12/18/14 1130 12/20/14 1130 12/27/14 1130 12/27/14 1513 01/08/15 1130   Increase Aerobic Exercise and Physical Activity   Goals Progress/Improvement seen      Yes    Comments     Ms Ferriss states she has noticed an improvement in her stamina and endurance with  her move into her new apartment. She is tired, but she has accomplished more physically than she expected due to [place].   Understand more about Heart/Pulmonary Disease   Goals Progress/Improvement seen  Yes   Yes    Comments Ms Hoot is planning to start a Pulmonary Fibrosis Support Group in the [place], [place] area. She has support from the IPF Association and good educational material.   Ms Wike shared information with me about a germ free vaporizer for use at night, and information on a humidifier monitor which I can in turn share in my Trigger education session.    Improve shortness of breath with ADL's   Goals Progress/Improvement seen  Yes       Comments Ms Lanza states she already feels a difference with her breathing even after 5 sessions.       Breathing Techniques   Goals Progress/Improvement seen   Yes   Yes   Comments  Ms Lawhorne uses PLB during her exercise goals and during activity at home.   Ms Boster uses PLB with her exercise goals and activity at home. She also paces herself.   Increase knowledge of respiratory medications   Goals Progress/Improvement seen  Yes  Yes  Yes   Comments Increased Ms Roen oxygen to 6l/m on the XR - discussed the importance of keeping her O2Sat's in the 90's during activity.  Reviewed technique for Albuterol MDI with spacer - good understanding and demonstration  Ms Mortellaro is adjusting her oxygen independently with activites and use of her oximeter.     01/19/15 1418 01/29/15 1130 02/05/15 1130 02/07/15 1130 02/07/15 1224   Increase Aerobic Exercise and Physical Activity   Goals Progress/Improvement seen   Yes      Comments  Ms Nussbaumer improved her mid 59md by 1270f Minimal Importance Difference for Pulmonary Fibrosis is 78.72 to 147.27f80f     Understand more about Heart/Pulmonary Disease   Goals Progress/Improvement seen   Yes Yes     Comments   Ms BalPrimianos been introduced to 2 other participants in [place] have PF and are interested in her new support group she is starting in October. Ms BalSalley Hews very pro active for PF. Ms BalMartinezgarcias been invited to the [employer] [facility] to be a volPsychologist, occupationalth her expenses paid by the PF Association. She is very excited about this opportunity.     Improve shortness of breath with ADL's   Goals Progress/Improvement seen  Yes    Yes   Comments Ms. BalVasbinderates that she is feeling much stronger.  She is excited about setteling into her new appartment and using the gym at her complex.  She feels like she can do more now and has less SOB.    SOB is decreasing on the exercise equipment. She doesn't really notice SOB around the house and sometimes doesn't recognize when her 02 saturation is low, but she has noticed an ability to do more without feeling tired. 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 She is still on prednisone which is helping with the SOB.    Breathing Techniques   Goals Progress/Improvement seen      Yes   Comments     She is still practicing the deep breathing. It is easy for her to take slow exhales, but still difficult to take deep breaths in. She is continuing to work on her breathing techniques.   Increase  knowledge of respiratory medications   Goals Progress/Improvement seen     Yes    Comments    Discussed with Ms Amundson the advantage of using her oxygen cannula which is supplied by [employer] - this cannula has a small bubble at the nasal area which helps with high flows and  acts as a reservoir  to help with oxygenation.      02/28/15 1130 03/05/15 1000 03/13/15 0702 03/14/15 1130     Increase Aerobic Exercise and Physical Activity   Goals Progress/Improvement seen    Yes     Comments   Ms Wilton has increased her exercise goals on the NS, XR, and Treadmill. She has advanced to 3lb weights for her resistive training. Her post  10md has exceeded the Minimal Importance Difference of  147.641ffor PF by 17.52f85fShe plans to continue her exercise at her apartment complex and maintain the 11m25mf exercise she has done in [facility] [place] increased her exercise goals on the NS, XR, and Treadmill. She has advanced to 3lb weights for her resistive training. Her post 6mwd19ms exceeded the Minimal Importance Difference of  147.6ft f15fPF by 17.52ft. 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 This is her second year in a row for this event. She has such a passion for spreading public awareness of IPF! Ms Robarge Vanderhoefood understanding of her Pulmonary Fibrosis. She has activily increased her knowledge of the disease, has participated in the [employer] start a PF Support Group [place] an inspiration for for patients with chronic lung disease! Ms Mable Foulkood understanding of her Pulmonary Fibrosis. She has activily increased her knowledge of the disease, has participated in the [employer] start a PF Support Group [place] an inspiration for for patients with chronic lung disease! Ms Keysor Durrettood understanding of her Pulmonary Fibrosis. She has activily increased her knowledge of the disease, has participated in the [employer] start a PF Support Group [place] an inspiration for for patients with chronic lung disease!    Improve shortness of breath with ADL's   Goals Progress/Improvement seen   Yes      Comments  Ms Quijas Kucheras her shortness of breath with pacing, PLB, and use of her oxygen. She is a very  active person and has noticed the ability to do more without feeling tired. Ms Martin Piatts her shortness of breath with pacing, PLB, and use of her oxygen. She is a very active person and has noticed the ability to do more without feeling tired. Ms Sayegh Weiskopfs her shortness of breath with pacing, PLB, and use of her oxygen. She is a very active person and has noticed the ability to do more without feeling tired.    Breathing Techniques   Goals Progress/Improvement seen   Yes      Comments  Good technique with her PLB - it is extremely important for her activity. Good technique with her PLB - it is extremely important for  her activity. Good technique with her PLB - it is extremely important for her activity.    Increase knowledge of respiratory medications   Goals Progress/Improvement seen   Yes      Comments  Ms Parsley has a good understanding of her Advair, Proventil, and spacer. She uses 2-6l/m [place] and adjusts the flow appropriately. Ms Viscomi knows the importance of these medications for her treatment of Pulmonary Fibrosis. Ms Lebeck has a good understanding of her Advair, Proventil, and spacer. She uses 2-6l/m [place] and adjusts the flow appropriately. Ms Olden knows the importance of these medications for her treatment of Pulmonary Fibrosis.        Personal Goals Discharge (Final Personal Goals and Risk Factors Review):      Goals and Risk Factor Review - 03/14/15 1130    Increase Aerobic Exercise and Physical Activity   Comments Ms Riga has increased her exercise goals on the NS, XR, and Treadmill. She has advanced to 3lb weights for her resistive training. Her post 39md has exceeded the Minimal Importance Difference of  147.692ffor PF by 17.45f69fShe plans to continue her exercise at her apartment complex and maintain the 74m68mf exercise she has done in [facility] more about Heart/Pulmonary Disease   Comments Ms BaldDeckman a good understanding of her Pulmonary  Fibrosis. She has activily increased her knowledge of the disease, has participated in the [employer] to start a PF Support Group [place] inspiration for for patients with chronic lung disease!   Improve shortness of breath with ADL's   Comments Ms BaldFlamnages her shortness of breath with pacing, PLB, and use of her oxygen. She is a very active person and has noticed the ability to do more without feeling tired.   Breathing Techniques   Comments Good technique with her PLB - it is extremely important for her activity.      Comments: Ms BaldChalkerduated today and plans to exercise at her apartment gym. Thank you for the opportunity to work with your Patient, Ms DebrJanessa Mickle

## 2015-03-14 NOTE — Progress Notes (Signed)
Daily Session Note  Patient Details  Name: Erika Martinez MRN: [id number] Date of Birth: 05-01-1956 Referring Provider:  Juanito Doom, MD  Encounter Date: 03/14/2015  Check In:     Session Check In - 03/14/15 1213    Check-In   Staff Present Gerlene Burdock RN, BSN;Steven Way BS, ACSM EP-C, Exercise Physiologist   ER physicians immediately available to respond to emergencies [name] immediately available ER MD   Physician(s) Dr. Claudette Head and DR. Yao   Medication changes reported     No   Fall or balance concerns reported    No   Warm-up and Cool-down Performed on first and last piece of equipment   VAD Patient? No   Pain Assessment   Currently in Pain? No/denies         Goals Met:  Proper associated with RPD/PD & O2 Sat Exercise tolerated well  Goals Unmet:  Not Applicable  Goals Comments: Today is [name]'s last day 36/36 sessions of Pulmonary Rehab. [name] is going to start a local Pulm Fibrosis Support group. [name] has received patient information brochures to hand out and recently had a Fundraiser/Info session at the local mall.    Dr. Emily Filbert is Medical Director for [facility] and [name].

## 2015-03-21 ENCOUNTER — Inpatient Hospital Stay: Payer: 59 | Admitting: Family Medicine

## 2015-03-21 ENCOUNTER — Inpatient Hospital Stay: Payer: 59

## 2015-03-21 ENCOUNTER — Inpatient Hospital Stay: Payer: 59 | Attending: Family Medicine

## 2015-03-22 ENCOUNTER — Encounter: Payer: Self-pay | Admitting: Pulmonary Disease

## 2015-03-22 ENCOUNTER — Ambulatory Visit (INDEPENDENT_AMBULATORY_CARE_PROVIDER_SITE_OTHER): Payer: 59 | Admitting: Pulmonary Disease

## 2015-03-22 VITALS — BP 110/62 | HR 109 | Ht 62.0 in | Wt 135.0 lb

## 2015-03-22 DIAGNOSIS — Z23 Encounter for immunization: Secondary | ICD-10-CM | POA: Diagnosis not present

## 2015-03-22 DIAGNOSIS — Z8619 Personal history of other infectious and parasitic diseases: Secondary | ICD-10-CM

## 2015-03-22 DIAGNOSIS — J84112 Idiopathic pulmonary fibrosis: Secondary | ICD-10-CM

## 2015-03-22 DIAGNOSIS — J9611 Chronic respiratory failure with hypoxia: Secondary | ICD-10-CM | POA: Diagnosis not present

## 2015-03-22 NOTE — Patient Instructions (Signed)
We will repeat lung function testing in [place] with a 6 minute walk in December We will see you back in January Call me as soon as you've heard the results of your hepatitis C test Keep taking the prednisone as prescribed Make sure you call the [facility] transplant center as soon if you've heard the results of the hepatitis C test

## 2015-03-22 NOTE — Progress Notes (Signed)
Subjective:    Patient ID: Erika Martinez, female    DOB: 1956/03/19, 59 y.o.   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  She was found to have connective tissue disease associated UIP and was referred to the [facility] center.  She was found to have Hepatitis C in 2015 and her transplant evaluation has been held further as of 06/2014.  2007 CXR > question ILD 08/2013 CXR > clear progression of interstitial infiltrate and volume loss 08/2013 cXR > pneumonia 10/06/2013 CT chest ([place] read) > compatible with UIP, mildly enlarged right paratracheal lymph node, likely old granulomatous disease, mild cardiomegaly 10/06/2013 pulmonary function test [facility]> flow volume loop compatible with obstruction, ratio 77%, FEV1 0.83 L (39% predicted, -4% change with bronchodilator), unable to perform lung volumes, DLCO 9.3 (54% predicted) 12/09/2014 6MW > 990 feet, O2 saturation 80% on 2L > improved with 3L 01/2014 ANA neg, RF positive 44.7, SCL-70 neg, SSA neg, SSB neg, CCP negative, adolase normal, CRP 0.35 (normal) 02/2014 Started prednisone, referred to [facility] 03/01/2014 6MW 900 feet, dropped to 79% on 3L [place] 03/09/2014 Ratio 77%, FEV1 0.70L (33% pred), FVC 0.91L (32% pred), DLCO 5.4 (31% pred) 08/08/2014 PFT> Ratio 63% post (but 78% pre), FEV1 0.74L (39% pred), FVC 1.18L (48% pred),   HPI Chief Complaint  Patient presents with  . Follow-up    pt. states she has a cold. no SOB.  no wheezing. dry cough x3d no chest pain/tightness.   Erika Martinez remains off of the [place] treatment for her Hepatitis C. She is supposed to have blood work to check for hepatitis C again.  She continue to be active in pulmonary fibrosis support groups in [place]. 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  She is actually being flown to  [facility] for this.  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   She plans to work through the year. So far it has bee ngoing OK.  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 She had a cold recently, but hasn't had any trouble breathing. She suffered from allergies recently. Still using 2L at rest and 4L O2 when exerting herself (on the POC)  She is coughing rarely, not too bad. Taking 20mg  prednisone right now.  Past Medical History  Diagnosis Date  . Asthma   . Mitral valve prolapse   . Seasonal allergies   . Hypertension   . Abnormal hemoglobin (HCC)   . Plantar wart   . Symptomatic menopausal or female climacteric states   . Scleroderma ([place])     Dr. Pennie Banter  . Pulmonary fibrosis ([place])   . Allergy   . Oxygen dependent   . Erythrocytosis   . Hep C w/o coma, chronic ([place])   . Lumbago   . Anxiety   . GERD (gastroesophageal reflux disease)   . IBS (irritable bowel syndrome)   . Controlled insomnia   . Spondylolisthesis      Review of Systems  Constitutional: Negative for fever, chills and fatigue.  HENT: Negative for postnasal drip, rhinorrhea and sinus pressure.   Respiratory: Positive for shortness of breath. Negative for cough and wheezing.   Cardiovascular: Negative for chest pain, palpitations and leg swelling.       Objective:   Physical Exam  Filed Vitals:  03/22/15 1408  BP: 110/62  Pulse: 109  Height: 5\' 2"  (1.575 m)  Weight: 135 lb (61.236 kg)  SpO2: 97%  2L [place]   Gen: well appearing, no acute distress HEENT: NCAT, EOMi, OP clear PULM: Crackles R base, CTA otherwise CV: RRR, no mgr, no JVD AB: BS+, soft, nontender, no hsm Ext: warm, no edema, + clubbing, no cyanosis Derm: no rash or skin breakdown  Records from GI clinic reviewed, she completed treatment for hepatitis C Records from pulmonary rehabilitation reviewed, unfortunately they did not send over her 6 minute walk         Assessment & Plan:   UIP (usual interstitial pneumonitis) (HCC) Again, it appears that she has 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 Her lung function testing in the last year has been relatively stable. We will plan to repeat pulmonary function testing in December. She continues with treatment as such with prednisone 20 mg daily. Before starting Imuran I will see results of a repeat hepatitis C panel and liver function testing.  Plan: She is to have hepatitis C repeat testing performed now, I want her to call me with those results. Follow-up liver function testing as well If her liver function testing is normal and her hepatitis C viral load is undetectable then I'm going to start Imuran If hepatitis C viral load is undetectable then she needs to call the [facility] to let her know that she has been curatively treated for hepatitis C with Harvoni Repeat pulmonary function testing and 6 minute walk in December (this will be 6 months since the last study) Follow-up January  Chronic hypoxemic respiratory failure ([place]) Continue 2 L at rest, 3 L with exertion (continuous) 4 L pulse with exertion  History of hepatitis C As detailed above  > 25 minutes spent today in clinic with Hilda Blades  Updated Medication List Outpatient Encounter Prescriptions as of 03/22/2015  Medication Sig  . albuterol (PROVENTIL HFA;VENTOLIN HFA) 108 (90 BASE) MCG/ACT inhaler Inhale 2 puffs into the lungs every 4 (four) hours as needed for wheezing or shortness of breath.  . ALPRAZolam (XANAX) 0.5 MG tablet Take 1 tablet (0.5 mg total) by mouth 3 (three) times daily as needed for anxiety.  . cholecalciferol (VITAMIN D) 1000 UNITS tablet Take 2,000 Units by mouth daily.  Marland Kitchen diltiazem (CARDIZEM) 60 MG tablet Take 1 tablet (60 mg total) by mouth daily.  Marland Kitchen DYMISTA 137-50 MCG/ACT SUSP SPRAY ONCE IN EACH NOSTRIL TWICE DAILY  . Fluticasone-Salmeterol (ADVAIR DISKUS) 500-50 MCG/DOSE  AEPB Inhale 1 puff into the lungs 2 (two) times daily.  . Fluticasone-Salmeterol (ADVAIR DISKUS) 500-50 MCG/DOSE AEPB Inhale into the lungs.  Marland Kitchen loratadine (CLARITIN) 10 MG tablet TAKE 1 TABLET DAILY  . nystatin (MYCOSTATIN) 100000 UNIT/ML suspension TAKE 1 TABLESPOON BY MOUTH BEFORE MEALS AND AT BEDTIME (SWISH AND SPIT OUT)  . omeprazole (PRILOSEC) 40 MG capsule Take 1 capsule (40 mg total) by mouth daily.  . predniSONE (DELTASONE) 20 MG tablet Take 1 tablet (20 mg total) by mouth daily.  . predniSONE (DELTASONE) 20 MG tablet Take 20 mg by mouth.  . vitamin B-12 (CYANOCOBALAMIN) 250 MCG tablet Place 250 mcg under the tongue daily.  . [DISCONTINUED] Azelastine-Fluticasone (DYMISTA) 137-50 MCG/ACT SUSP Place into the nose.  Marland Kitchen omeprazole (PRILOSEC) 20 MG capsule Take 20 mg by mouth daily.   No facility-administered encounter medications on file as of 03/22/2015.

## 2015-03-22 NOTE — Assessment & Plan Note (Addendum)
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 Her lung function testing in the last year has been relatively stable. We will plan to repeat pulmonary function testing in December. She continues with treatment as such with prednisone 20 mg daily. Before starting Imuran I will see results of a repeat hepatitis C panel and liver function testing.  Plan: She is to have hepatitis C repeat testing performed now, I want her to call me with those results. Follow-up liver function testing as well If her liver function testing is normal and her hepatitis C viral load is undetectable then I'm going to start Imuran If hepatitis C viral load is undetectable then she needs to call the [facility] to let her know that she has been curatively treated for hepatitis C with Harvoni Repeat pulmonary function testing and 6 minute walk in December (this will be 6 months since the last study) Follow-up January

## 2015-03-22 NOTE — Assessment & Plan Note (Signed)
Continue 2 L at rest, 3 L with exertion (continuous) 4 L pulse with exertion

## 2015-03-22 NOTE — Assessment & Plan Note (Signed)
As detailed above 

## 2015-03-23 ENCOUNTER — Ambulatory Visit: Payer: 59 | Admitting: Family Medicine

## 2015-03-29 ENCOUNTER — Ambulatory Visit: Payer: 59 | Admitting: Family Medicine

## 2015-04-04 ENCOUNTER — Other Ambulatory Visit: Payer: Self-pay | Admitting: Pulmonary Disease

## 2015-04-06 ENCOUNTER — Ambulatory Visit (INDEPENDENT_AMBULATORY_CARE_PROVIDER_SITE_OTHER): Payer: 59 | Admitting: Family Medicine

## 2015-04-06 ENCOUNTER — Encounter: Payer: Self-pay | Admitting: Family Medicine

## 2015-04-06 VITALS — BP 136/74 | HR 112 | Temp 98.3°F | Resp 18 | Ht 62.0 in | Wt 139.9 lb

## 2015-04-06 DIAGNOSIS — I471 Supraventricular tachycardia: Secondary | ICD-10-CM | POA: Diagnosis not present

## 2015-04-06 DIAGNOSIS — R7989 Other specified abnormal findings of blood chemistry: Secondary | ICD-10-CM | POA: Diagnosis not present

## 2015-04-06 DIAGNOSIS — K219 Gastro-esophageal reflux disease without esophagitis: Secondary | ICD-10-CM

## 2015-04-06 DIAGNOSIS — I10 Essential (primary) hypertension: Secondary | ICD-10-CM | POA: Diagnosis not present

## 2015-04-06 DIAGNOSIS — F419 Anxiety disorder, unspecified: Secondary | ICD-10-CM

## 2015-04-06 DIAGNOSIS — J8410 Pulmonary fibrosis, unspecified: Secondary | ICD-10-CM

## 2015-04-06 DIAGNOSIS — D751 Secondary polycythemia: Secondary | ICD-10-CM | POA: Diagnosis not present

## 2015-04-06 DIAGNOSIS — G4700 Insomnia, unspecified: Secondary | ICD-10-CM

## 2015-04-06 DIAGNOSIS — J9611 Chronic respiratory failure with hypoxia: Secondary | ICD-10-CM

## 2015-04-06 DIAGNOSIS — Z79899 Other long term (current) drug therapy: Secondary | ICD-10-CM | POA: Diagnosis not present

## 2015-04-06 DIAGNOSIS — E538 Deficiency of other specified B group vitamins: Secondary | ICD-10-CM

## 2015-04-06 DIAGNOSIS — M349 Systemic sclerosis, unspecified: Secondary | ICD-10-CM

## 2015-04-06 DIAGNOSIS — R0689 Other abnormalities of breathing: Secondary | ICD-10-CM

## 2015-04-06 MED ORDER — DILTIAZEM HCL 60 MG PO TABS: 60.0000 mg | ORAL_TABLET | Freq: Every day | ORAL | Status: DC

## 2015-04-06 MED ORDER — TRAZODONE HCL 50 MG PO TABS: 25.0000 mg | ORAL_TABLET | Freq: Every evening | ORAL | Status: DC | PRN

## 2015-04-06 NOTE — Progress Notes (Signed)
Name: Erika Martinez   MRN: [id number]    DOB: [birth year]   Date:04/06/2015       Progress Note  Subjective  Chief Complaint  Chief Complaint  Patient presents with  . Medication Refill    follow-up  . Hypertension  . Allergic Rhinitis     stuffiness at night    HPI  HTN and SVT ;she has been compliant with Cardizem and denies chest pain or palpitation.    AR: she states Dymista helps with her symptoms, she has nasal congestion and clear rhinorrhea  Pulmonary Fibrosis/ secondary to scleroderma: sees Dr. Lake Bells, she was initially seen diagnosed with Scleroderma by Dr. Jefm Bryant and advised to continue calcium channel blockers.  She is on continues oxygen from 2-4 , at night around 3.  Still using inhalers and back on prednisone 20 mg for the past few months. She has been able to work, has SOB with moderate activity  ( on oxygen ) . She was invited to volunteer at a Pulmonary Fibrosis Conference in [place] this November.  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 She recently had a fundraiser at the mall and raised $1000 to the [facility].   Erythrocytosis: seen by Dr. Berton Lan and had phlebotomy one time and has been doing well for a while, we will recheck level.   GERD: she is on high dose of prednisone, but is doing well since Omeprazole was increased to 40 mg daily . Denies regurgitation, she has heartburn at most once a week, when she over eats and resolves within a couple of minutes. Occasionally when she gets up.   Insomnia: able to fall asleep with alprazolam, but is waking up after 4 hours and cannot fall back asleep. She states she feels refreshed when she is able to sleep for 4 hours  Patient Active Problem List   Diagnosis Date Noted  . Pulmonary fibrosis ([place]) 04/06/2015   . History of hepatitis C 02/02/2015  . Insomnia 02/02/2015  . Gastro-esophageal reflux disease without esophagitis 02/02/2015  . IBS (irritable bowel syndrome) 02/02/2015  . Low serum cobalamin 02/02/2015  . Dependence on supplemental oxygen 02/02/2015  . Perennial allergic rhinitis with seasonal variation 02/02/2015  . Scleroderma ([place]) 02/02/2015  . SPL (spondylolisthesis) 02/02/2015  . Supraventricular tachycardia ([place]) 02/02/2015  . Vitamin D deficiency 02/02/2015  . Anxiety 02/02/2015  . History of pneumococcal pneumonia 09/13/2013  . Chronic hypoxemic respiratory failure ([place]) 09/13/2013  . Polycythemia, secondary 09/13/2013    Past Surgical History  Procedure Laterality Date  . Vaginal hysterectomy    . Abdominal hysterectomy      Family History  Problem Relation Age of Onset  . Lung cancer Father   . Cancer Father   . Alzheimer's disease Mother     Social History   Social History  . Marital Status: Divorced    Spouse Name: N/A  . Number of Children: N/A  . Years of Education: N/A   Occupational History  . Not on file.   Social History Main Topics  . Smoking status: Never Smoker   . Smokeless tobacco: Never Used  . Alcohol Use: 2.0 oz/week    4 Standard drinks or equivalent per week     Comment: "social drinker"  . Drug Use: No  . Sexual Activity:  Not on file   Other Topics Concern  . Not on file   Social History Narrative     Current outpatient prescriptions:  .  albuterol (PROVENTIL HFA;VENTOLIN HFA) 108 (90 BASE) MCG/ACT inhaler, Inhale 2 puffs into the lungs every 4 (four) hours as needed for wheezing or shortness of breath., Disp: 8 g, Rfl: 5 .  ALPRAZolam (XANAX) 0.5 MG tablet, Take 1 tablet (0.5 mg total) by mouth 3 (three) times daily as needed for anxiety., Disp: 45 tablet, Rfl: 2 .  cholecalciferol (VITAMIN D) 1000 UNITS tablet, Take 2,000 Units by mouth daily., Disp: , Rfl:  .  diltiazem (CARDIZEM) 60 MG tablet, Take 1 tablet (60 mg  total) by mouth daily., Disp: 30 tablet, Rfl: 2 .  DYMISTA 137-50 MCG/ACT SUSP, SPRAY ONCE IN EACH NOSTRIL TWICE DAILY, Disp: , Rfl: 12 .  Fluticasone-Salmeterol (ADVAIR DISKUS) 500-50 MCG/DOSE AEPB, Inhale 1 puff into the lungs 2 (two) times daily., Disp: 60 each, Rfl: 5 .  loratadine (CLARITIN) 10 MG tablet, TAKE 1 TABLET DAILY, Disp: 30 tablet, Rfl: 3 .  nystatin (MYCOSTATIN) 100000 UNIT/ML suspension, TAKE 1 TABLESPOON BY MOUTH BEFORE MEALS AND AT BEDTIME (SWISH AND SPIT OUT), Disp: , Rfl: 2 .  omeprazole (PRILOSEC) 40 MG capsule, Take 1 capsule (40 mg total) by mouth daily., Disp: 30 capsule, Rfl: 3 .  predniSONE (DELTASONE) 20 MG tablet, TAKE 1 TABLET (20 MG TOTAL) BY MOUTH DAILY., Disp: 30 tablet, Rfl: 3 .  vitamin B-12 (CYANOCOBALAMIN) 250 MCG tablet, Place 250 mcg under the tongue daily., Disp: , Rfl:   No Known Allergies   ROS  Constitutional: Negative for fever , positive for weight change.  Respiratory: Positive for cough and shortness of breath.   Cardiovascular: Negative for chest pain or palpitations.  Gastrointestinal: Negative for abdominal pain, no bowel changes.  Musculoskeletal: Negative for gait problem or joint swelling.  Skin: Negative for rash.  Neurological: Negative for dizziness or headache.  No other specific complaints in a complete review of systems (except as listed in HPI above).   Objective  Filed Vitals:   04/06/15 1418  BP: 136/74  Pulse: 112  Temp: 98.3 F (36.8 C)  TempSrc: Oral  Resp: 18  Height: 5\' 2"  (1.575 m)  Weight: 139 lb 14.4 oz (63.458 kg)  SpO2: 97%    Body mass index is 25.58 kg/(m^2).  Physical Exam  Constitutional: Patient appears well-developed and well-nourished. No distress.  HEENT: head atraumatic, normocephalic, pupils equal and reactive to light, conjunctiva a little injected,  neck supple, throat within normal limits Cardiovascular: Normal rate, regular rhythm and normal heart sounds.  No murmur heard. No BLE  edema. Pulmonary/Chest: Effort normal has end inspiratory fine crackles,  Nasal cannula oxygen Abdominal: Soft.  There is no tenderness. Psychiatric: Patient has a normal mood and affect. behavior is normal. Judgment and thought content normal. Skin: skin around nails is violaceous in color  PHQ2/9: Depression screen [facility] 2/9 04/06/2015 03/14/2015 02/02/2015 12/05/2014  Decreased Interest 0 0 0 1  Down, Depressed, Hopeless 0 0 0 0  PHQ - 2 Score 0 0 0 1    Fall Risk: Fall Risk  04/06/2015 02/23/2015 02/02/2015 12/05/2014  Falls in the past year? No No No No    Functional Status Survey: Is the patient deaf or have difficulty hearing?: No Does the patient have difficulty seeing, even when wearing glasses/contacts?: Yes (glasses) Does the patient have difficulty concentrating, remembering, or making decisions?: No Does the patient have difficulty walking  or climbing stairs?: No Does the patient have difficulty dressing or bathing?: No Does the patient have difficulty doing errands alone such as visiting a doctor's office or shopping?: No    Assessment & Plan  1. Pulmonary fibrosis ([place])  stable  2. Scleroderma ([place])  Seen by Rheumatologist  3. Chronic hypoxemic respiratory failure (HCC)  Continue medications and follow up with pulmonologist  4. Low serum cobalamin  - Vitamin B12  5. Supraventricular tachycardia (HCC)  - diltiazem (CARDIZEM) 60 MG tablet; Take 1 tablet (60 mg total) by mouth daily.  Dispense: 30 tablet; Refill: 2  6. Gastro-esophageal reflux disease without esophagitis  Continue Omeprazole.   7. Insomnia  We will start Trazodone 25-50 mg qpm for sleep #30 and 3 refills  8. Anxiety  Continue alprazolam prn   9. Erythrocytosis due to alveolar hypoventilation  - Hematocrit  10. Long-term use of high-risk medication  - Comprehensive metabolic panel

## 2015-04-13 ENCOUNTER — Other Ambulatory Visit: Payer: Self-pay | Admitting: Pulmonary Disease

## 2015-05-02 ENCOUNTER — Telehealth: Payer: Self-pay
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
## 2015-05-02 NOTE — Telephone Encounter (Signed)
I don't think it is safe to call it in without seeing her. Ask her to call her pulmonologist or go to Urgent Care during the holidays if needed

## 2015-05-02 NOTE — Telephone Encounter (Signed)
Patient notified

## 2015-05-03 ENCOUNTER — Other Ambulatory Visit: Payer: Self-pay | Admitting: Family Medicine

## 2015-05-08 ENCOUNTER — Encounter: Payer: Self-pay | Admitting: Family Medicine

## 2015-05-08 ENCOUNTER — Ambulatory Visit (INDEPENDENT_AMBULATORY_CARE_PROVIDER_SITE_OTHER): Payer: 59 | Admitting: Family Medicine

## 2015-05-08 VITALS — BP 122/80 | HR 68 | Temp 97.9°F | Resp 16 | Ht 62.0 in | Wt 144.2 lb

## 2015-05-08 DIAGNOSIS — J3089 Other allergic rhinitis: Secondary | ICD-10-CM

## 2015-05-08 DIAGNOSIS — J309 Allergic rhinitis, unspecified: Secondary | ICD-10-CM | POA: Diagnosis not present

## 2015-05-08 DIAGNOSIS — J8410 Pulmonary fibrosis, unspecified: Secondary | ICD-10-CM | POA: Diagnosis not present

## 2015-05-08 DIAGNOSIS — J302 Other seasonal allergic rhinitis: Secondary | ICD-10-CM

## 2015-05-08 MED ORDER — MONTELUKAST SODIUM 10 MG PO TABS: 10.0000 mg | ORAL_TABLET | Freq: Every day | ORAL | Status: DC

## 2015-05-08 MED ORDER — LORATADINE 10 MG PO TABS
10.0000 mg | ORAL_TABLET | Freq: Two times a day (BID) | ORAL | Status: DC
Start: 2015-05-08 — End: 2015-12-18

## 2015-05-08 NOTE — Progress Notes (Signed)
Name: Erika Martinez   MRN: [id number]    DOB: [birth year]   Date:12020/01/2715       Progress Note  Subjective  Chief Complaint  Chief Complaint  Patient presents with  . URI    Patient wants to have her lungs checked. Symptoms include sneezing, runny nose and cough.    HPI  Pulmonary Fibrosis: she states she went to [place] three weeks ago, she states with the change of weather she noticed constant rhinorrhea, nasal congestion, some post-nasal drainage that is causing  a tickle on her throat and makes her cough. She has also noticed hoarseness. She states oxygen saturation has been good at home. She is taking prednisone 20 mg. She has been feeling more tired, but she has not been physically active and has gained weight. No fever, no change in sputum quality. She came in just to make sure she is okay.   Patient Active Problem List   Diagnosis Date Noted  . Pulmonary fibrosis ([place]) 04/06/2015  . Hypertension, benign 04/06/2015  . History of hepatitis C 02/02/2015  . Insomnia 02/02/2015  . Gastro-esophageal reflux disease without esophagitis 02/02/2015  . IBS (irritable bowel syndrome) 02/02/2015  . Low serum cobalamin 02/02/2015  . Dependence on supplemental oxygen 02/02/2015  . Perennial allergic rhinitis with seasonal variation 02/02/2015  . Scleroderma ([place]) 02/02/2015  . SPL (spondylolisthesis) 02/02/2015  . Supraventricular tachycardia ([place]) 02/02/2015  . Vitamin D deficiency 02/02/2015  . Anxiety 02/02/2015  . History of pneumococcal pneumonia 09/13/2013  . Chronic hypoxemic respiratory failure ([place]) 09/13/2013  . Polycythemia, secondary 09/13/2013    Past Surgical History  Procedure Laterality Date  . Vaginal hysterectomy    . Abdominal hysterectomy      Family History  Problem Relation Age of Onset  . Lung cancer Father   . Cancer Father   . Alzheimer's disease Mother     Social History   Social History  . Marital Status: Divorced    Spouse Name: N/A  .  Number of Children: N/A  . Years of Education: N/A   Occupational History  . Not on file.   Social History Main Topics  . Smoking status: Never Smoker   . Smokeless tobacco: Never Used  . Alcohol Use: 2.0 oz/week    4 Standard drinks or equivalent per week     Comment: "social drinker"  . Drug Use: No  . Sexual Activity: Not on file   Other Topics Concern  . Not on file   Social History Narrative     Current outpatient prescriptions:  .  albuterol (PROVENTIL HFA;VENTOLIN HFA) 108 (90 BASE) MCG/ACT inhaler, Inhale 2 puffs into the lungs every 4 (four) hours as needed for wheezing or shortness of breath., Disp: 8 g, Rfl: 5 .  ALPRAZolam (XANAX) 0.5 MG tablet, Take 1 tablet (0.5 mg total) by mouth 3 (three) times daily as needed for anxiety., Disp: 45 tablet, Rfl: 2 .  cholecalciferol (VITAMIN D) 1000 UNITS tablet, Take 2,000 Units by mouth daily., Disp: , Rfl:  .  diltiazem (CARDIZEM) 60 MG tablet, TAKE 1 TABLET (60 MG TOTAL) BY MOUTH DAILY., Disp: 30 tablet, Rfl: 2 .  DYMISTA 137-50 MCG/ACT SUSP, SPRAY ONCE IN EACH NOSTRIL TWICE DAILY, Disp: , Rfl: 12 .  Fluticasone-Salmeterol (ADVAIR DISKUS) 500-50 MCG/DOSE AEPB, Inhale 1 puff into the lungs 2 (two) times daily., Disp: 60 each, Rfl: 5 .  loratadine (CLARITIN) 10 MG tablet, TAKE 1 TABLET DAILY, Disp: 30 tablet, Rfl: 3 .  nystatin (  MYCOSTATIN) 100000 UNIT/ML suspension, TAKE 1 TABLESPOON BY MOUTH BEFORE MEALS AND AT BEDTIME (SWISH AND SPIT OUT), Disp: , Rfl: 2 .  omeprazole (PRILOSEC) 40 MG capsule, Take 1 capsule (40 mg total) by mouth daily., Disp: 30 capsule, Rfl: 3 .  predniSONE (DELTASONE) 20 MG tablet, TAKE 1 TABLET (20 MG TOTAL) BY MOUTH DAILY., Disp: 30 tablet, Rfl: 3 .  traZODone (DESYREL) 50 MG tablet, Take 0.5-1 tablets (25-50 mg total) by mouth at bedtime as needed for sleep., Disp: 30 tablet, Rfl: 3 .  vitamin B-12 (CYANOCOBALAMIN) 250 MCG tablet, Place 250 mcg under the tongue daily., Disp: , Rfl:   No Known  Allergies   ROS  Ten systems reviewed and is negative except as mentioned in HPI   Objective  Filed Vitals:   05/08/15 0910  BP: 122/80  Pulse: 68  Temp: 97.9 F (36.6 C)  TempSrc: Oral  Resp: 16  Height: 5\' 2"  (1.575 m)  Weight: 144 lb 3.2 oz (65.409 kg)  SpO2: 100%    Body mass index is 26.37 kg/(m^2).  Physical Exam  Constitutional: Patient appears well-developed and well-nourished. No distress.  HEENT: head atraumatic, normocephalic, pupils equal and reactive to light, ears right side has cerumen - advised otc drops, left TM normal  neck supple, throat within normal limits Cardiovascular: Normal rate, regular rhythm and normal heart sounds.  No murmur heard. No BLE edema. Pulmonary/Chest: Effort normal , hoarse crackles on bases ( stable ), no rhonchi or wheezing. Abdominal: Soft.  There is no tenderness. Psychiatric: Patient has a normal mood and affect. behavior is normal. Judgment and thought content normal.   PHQ2/9: Depression screen [facility] 2/9 04/06/2015 03/14/2015 02/02/2015 12/05/2014  Decreased Interest 0 0 0 1  Down, Depressed, Hopeless 0 0 0 0  PHQ - 2 Score 0 0 0 1    Fall Risk: Fall Risk  111/29/2016 04/06/2015 02/23/2015 02/02/2015 12/05/2014  Falls in the past year? No No No No No     Functional Status Survey: Is the patient deaf or have difficulty hearing?: No Does the patient have difficulty seeing, even when wearing glasses/contacts?: Yes (glasses) Does the patient have difficulty concentrating, remembering, or making decisions?: No Does the patient have difficulty walking or climbing stairs?: No Does the patient have difficulty dressing or bathing?: No Does the patient have difficulty doing errands alone such as visiting a doctor's office or shopping?: No    Assessment & Plan  1. Pulmonary fibrosis (HCC)  Continue nasal cannula oxygen  2. Perennial allergic rhinitis with seasonal variation  We will add Singulair and increase loratadine to  twice daily, symptoms seems to be allergy related - montelukast (SINGULAIR) 10 MG tablet; Take 1 tablet (10 mg total) by mouth at bedtime.  Dispense: 30 tablet; Refill: 3 - loratadine (CLARITIN) 10 MG tablet; Take 1 tablet (10 mg total) by mouth 2 (two) times daily.  Dispense: 60 tablet; Refill: 3

## 2015-05-09 LAB — COMPREHENSIVE METABOLIC PANEL
A/G RATIO: 1.7 (ref 1.1–2.5)
ALT: 32 IU/L (ref 0–32)
AST: 29 IU/L (ref 0–40)
Albumin: 4.3 g/dL (ref 3.6–4.8)
Alkaline Phosphatase: 74 IU/L (ref 39–117)
BUN / CREAT RATIO: 17 (ref 11–26)
BUN: 18 mg/dL (ref 8–27)
Bilirubin Total: 0.3 mg/dL (ref 0.0–1.2)
CALCIUM: 9.8 mg/dL (ref 8.7–10.3)
CO2: 25 mmol/L (ref 18–29)
Chloride: 103 mmol/L (ref 97–106)
Creatinine, Ser: 1.07 mg/dL — ABNORMAL HIGH (ref 0.57–1.00)
GFR, EST AFRICAN AMERICAN: 65 mL/min/{1.73_m2} (ref >59)
GFR, EST NON AFRICAN AMERICAN: 56 mL/min/{1.73_m2} — AB (ref >59)
GLOBULIN, TOTAL: 2.6 g/dL (ref 1.5–4.5)
Glucose: 106 mg/dL — ABNORMAL HIGH (ref 65–99)
POTASSIUM: 4.2 mmol/L (ref 3.5–5.2)
SODIUM: 145 mmol/L — AB (ref 136–144)
TOTAL PROTEIN: 6.9 g/dL (ref 6.0–8.5)

## 2015-05-09 LAB — HEMATOCRIT: HEMATOCRIT: 42.7 % (ref 34.0–46.6)

## 2015-05-09 LAB — VITAMIN B12: Vitamin B-12: >2000 pg/mL — ABNORMAL HIGH (ref 211–946)

## 2015-05-17 ENCOUNTER — Other Ambulatory Visit: Payer: Self-pay | Admitting: Family Medicine

## 2015-05-18 NOTE — Telephone Encounter (Signed)
Patient requesting refill. 

## 2015-05-19 ENCOUNTER — Other Ambulatory Visit: Payer: Self-pay | Admitting: Family Medicine

## 2015-05-21 ENCOUNTER — Ambulatory Visit: Payer: 59

## 2015-05-22 ENCOUNTER — Telehealth: Payer: Self-pay | Admitting: Pulmonary Disease

## 2015-05-22 NOTE — Telephone Encounter (Signed)
Patient returned call, asked that she be called at work at [phone] until 2:30, after that may call cell [phone]

## 2015-05-22 NOTE — Telephone Encounter (Signed)
Left message for patient to call back  

## 2015-05-22 NOTE — Telephone Encounter (Signed)
Patient called and said that her HEP C test came back clear.  She said that Dr. Lake Bells asked her to call him to let him know and that he would contact the transplant clinic to let them know so she can be approved for transplant.  FYI to BQ

## 2015-05-23 NOTE — Telephone Encounter (Signed)
Its important for me to know this but she needs to call her transplant coordinator/contact at [facility] to share this with them too.

## 2015-05-30 ENCOUNTER — Encounter: Payer: Self-pay | Admitting: *Deleted

## 2015-05-31 ENCOUNTER — Other Ambulatory Visit: Payer: Self-pay | Admitting: Pulmonary Disease

## 2015-06-07 ENCOUNTER — Encounter: Payer: Self-pay | Admitting: Family Medicine

## 2015-06-07 ENCOUNTER — Ambulatory Visit (INDEPENDENT_AMBULATORY_CARE_PROVIDER_SITE_OTHER): Payer: 59 | Admitting: Family Medicine

## 2015-06-07 VITALS — BP 124/62 | HR 104 | Temp 98.2°F | Resp 20 | Wt 147.1 lb

## 2015-06-07 DIAGNOSIS — Z1239 Encounter for other screening for malignant neoplasm of breast: Secondary | ICD-10-CM | POA: Diagnosis not present

## 2015-06-07 DIAGNOSIS — J8410 Pulmonary fibrosis, unspecified: Secondary | ICD-10-CM

## 2015-06-07 DIAGNOSIS — F320 Major depressive disorder, single episode, mild: Secondary | ICD-10-CM | POA: Diagnosis not present

## 2015-06-07 MED ORDER — DULOXETINE HCL 30 MG PO CPEP: 30.0000 mg | ORAL_CAPSULE | Freq: Every day | ORAL | Status: DC

## 2015-06-07 NOTE — Progress Notes (Signed)
Name: Erika Martinez   MRN: [id number]    DOB: 11-30-1955   Date:06/07/2015       Progress Note  Subjective  Chief Complaint  Chief Complaint  Patient presents with  . URI    onset before christmas, went to Cardiologist got antibiotic and prednisone.  Patient states still having syptoms of SOB, drainage, cough, swelling and weight gain.  . Lung Transplant    wants to see about getting back on list since she has had treatment for Hep C    HPI  URI: she was seen by Cardiologist a couple week ago, treated with Azithromycin and green drainage resolved, she still has daily SOB and cough but is almost back to baseline  Pulmonary fibrosis: she was diagnosed in 09/2013 secondary to scleroderma, she see pulmonologist and tried to get in the transplant list at [facility] but was denied last year because she was HCV positive, she has been treated with Havoni and in June titer was negative, and repeat titer negative 05/2015. She does not have liver cirrhosis.    Major Depression: she feels tired, no motivation, upset about the weight gain secondary to prednisone, tired of having to drag oxygen tank around. She is also worried that she has a support group and needs to be better before she leads her group for pulmonary fibrosis once a month. Symptoms started over one year ago, but much worse over the past 4-5 months since she started taking daily prednisone and is gaining weight   Patient Active Problem List   Diagnosis Date Noted  . Mild major depression ([place]) 06/07/2015  . Pulmonary fibrosis ([place]) 04/06/2015  . Hypertension, benign 04/06/2015  . History of hepatitis C 02/02/2015  . Insomnia 02/02/2015  . Gastro-esophageal reflux disease without esophagitis 02/02/2015  . IBS (irritable bowel syndrome) 02/02/2015  . Low serum cobalamin 02/02/2015  . Dependence on supplemental oxygen 02/02/2015  . Perennial allergic rhinitis with seasonal variation 02/02/2015  . Scleroderma ([place]) 02/02/2015  . SPL  (spondylolisthesis) 02/02/2015  . Supraventricular tachycardia ([place]) 02/02/2015  . Vitamin D deficiency 02/02/2015  . Anxiety 02/02/2015  . History of pneumococcal pneumonia 09/13/2013  . Chronic hypoxemic respiratory failure ([place]) 09/13/2013  . Polycythemia, secondary 09/13/2013  . 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 Vaginal hysterectomy    . Abdominal hysterectomy      Family History  Problem Relation Age of Onset  . Lung cancer Father   . Cancer Father   . Alzheimer's disease Mother     Social History   Social History  . Marital Status: Divorced    Spouse Name: N/A  . Number of Children: N/A  . Years of Education: N/A   Occupational History  . Not on file.   Social History Main Topics  . Smoking status: Never Smoker   . Smokeless tobacco: Never Used  . Alcohol Use: 2.0 oz/week    4 Standard drinks or equivalent per week     Comment: "social drinker"  . Drug Use: No  . Sexual Activity: Not on file   Other Topics Concern  . Not on file   Social History Narrative     Current outpatient prescriptions:  .  ADVAIR DISKUS 500-50 MCG/DOSE AEPB, INHALE 1 PUFF INTO THE LUNGS 2 (TWO) TIMES DAILY., Disp: 60 each, Rfl: 5 .  albuterol (PROVENTIL HFA;VENTOLIN HFA) 108 (90 BASE) MCG/ACT inhaler, Inhale 2 puffs into the lungs every 4 (four) hours as  needed for wheezing or shortness of breath., Disp: 8 g, Rfl: 5 .  ALPRAZolam (XANAX) 0.5 MG tablet, TAKE 1 TABLET BY MOUTH3 TIMES DAILY AS NEEDED FOR ANXIETY, Disp: 45 tablet, Rfl: 2 .  cholecalciferol (VITAMIN D) 1000 UNITS tablet, Take 2,000 Units by mouth daily., Disp: , Rfl:  .  diltiazem (CARDIZEM) 60 MG tablet, TAKE 1 TABLET (60 MG TOTAL) BY MOUTH DAILY., Disp: 30 tablet, Rfl: 2 .  DULoxetine (CYMBALTA) 30 MG capsule, Take 1 capsule (30 mg total) by mouth daily., Disp: 60 capsule, Rfl: 0 .  DYMISTA 137-50 MCG/ACT SUSP, SPRAY ONCE IN EACH NOSTRIL TWICE  DAILY, Disp: , Rfl: 12 .  loratadine (CLARITIN) 10 MG tablet, Take 1 tablet (10 mg total) by mouth 2 (two) times daily., Disp: 60 tablet, Rfl: 3 .  montelukast (SINGULAIR) 10 MG tablet, Take 1 tablet (10 mg total) by mouth at bedtime., Disp: 30 tablet, Rfl: 3 .  nystatin (MYCOSTATIN) 100000 UNIT/ML suspension, TAKE 1 TABLESPOON BY MOUTH BEFORE MEALS AND AT BEDTIME (SWISH AND SPIT OUT), Disp: 473 mL, Rfl: 2 .  omeprazole (PRILOSEC) 40 MG capsule, Take 1 capsule (40 mg total) by mouth daily., Disp: 30 capsule, Rfl: 3 .  predniSONE (DELTASONE) 20 MG tablet, TAKE 1 TABLET (20 MG TOTAL) BY MOUTH DAILY., Disp: 30 tablet, Rfl: 3 .  traZODone (DESYREL) 50 MG tablet, Take 0.5-1 tablets (25-50 mg total) by mouth at bedtime as needed for sleep., Disp: 30 tablet, Rfl: 3 .  vitamin B-12 (CYANOCOBALAMIN) 250 MCG tablet, Place 250 mcg under the tongue daily., Disp: , Rfl:   No Known Allergies   ROS  Ten systems reviewed and is negative except as mentioned in HPI  Objective  Filed Vitals:   06/07/15 0959  BP: 124/62  Pulse: 104  Temp: 98.2 F (36.8 C)  TempSrc: Oral  Resp: 20  Weight: 147 lb 1.6 oz (66.724 kg)  SpO2: 98%    Body mass index is 26.9 kg/(m^2).  Physical Exam  Constitutional: Patient appears well-developed and well-nourished. Obese  No distress.  HEENT: head atraumatic, normocephalic, pupils equal and reactive to light, ears TM, neck supple, throat within normal limits Cardiovascular: Normal rate, regular rhythm and normal heart sounds.  No murmur heard. No BLE edema. Pulmonary/Chest: on nasal canula oxygen, crackles on both bases - stable Abdominal: Soft.  There is no tenderness. Psychiatric: Patient has a normal mood and affect. behavior is normal. Judgment and thought content normal.  Recent Results (from the past 2160 hour(s))  Comprehensive metabolic panel     Status: Abnormal   Collection Time: 05/08/15  9:58 AM  Result Value Ref Range   Glucose 106 (H) 65 - 99 mg/dL    BUN 18 8 - 27 mg/dL   Creatinine, Ser 1.07 (H) 0.57 - 1.00 mg/dL   GFR calc non Af Amer 56 (L) >59 mL/min/1.73   GFR calc Af Amer 65 >59 mL/min/1.73   BUN/Creatinine Ratio 17 11 - 26   Sodium 145 (H) 136 - 144 mmol/L    Comment: **Effective May 21, 2015 the reference interval**   for Sodium, Serum will be changing to:                                             134 - 144    Potassium 4.2 3.5 - 5.2 mmol/L   Chloride 103 97 - 106  mmol/L    Comment: **Effective May 21, 2015 the reference interval**   for Chloride, Serum will be changing to:                                              96 - 106    CO2 25 18 - 29 mmol/L   Calcium 9.8 8.7 - 10.3 mg/dL   Total Protein 6.9 6.0 - 8.5 g/dL   Albumin 4.3 3.6 - 4.8 g/dL   Globulin, Total 2.6 1.5 - 4.5 g/dL   Albumin/Globulin Ratio 1.7 1.1 - 2.5   Bilirubin Total 0.3 0.0 - 1.2 mg/dL   Alkaline Phosphatase 74 39 - 117 IU/L   AST 29 0 - 40 IU/L   ALT 32 0 - 32 IU/L  Vitamin B12     Status: Abnormal   Collection Time: 05/08/15  9:58 AM  Result Value Ref Range   Vitamin B-12 >2000 (H) 211 - 946 pg/mL  Hematocrit     Status: None   Collection Time: 05/08/15  9:58 AM  Result Value Ref Range   Hematocrit 42.7 34.0 - 46.6 %     PHQ2/9: Depression screen [facility] 2/9 04/06/2015 03/14/2015 02/02/2015 12/05/2014  Decreased Interest 0 0 0 1  Down, Depressed, Hopeless 0 0 0 0  PHQ - 2 Score 0 0 0 1     Fall Risk: Fall Risk  103-03-202016 04/06/2015 02/23/2015 02/02/2015 12/05/2014  Falls in the past year? No No No No No    Assessment & Plan  1. Pulmonary fibrosis ([place])  Letter for [facility] written today, continue oxygen, she can't afford PT  2. Breast cancer screening  - [facility] Digital Screening; Future  3. Mild major depression ([place])  Discussed medication, and she is willing to try something - DULoxetine (CYMBALTA) 30 MG capsule; Take 1 capsule (30 mg total) by mouth daily.  Dispense: 60 capsule; Refill: 0

## 2015-06-14 ENCOUNTER — Ambulatory Visit (INDEPENDENT_AMBULATORY_CARE_PROVIDER_SITE_OTHER): Payer: 59 | Admitting: Pulmonary Disease

## 2015-06-14 ENCOUNTER — Encounter: Payer: Self-pay | Admitting: Pulmonary Disease

## 2015-06-14 ENCOUNTER — Other Ambulatory Visit: Payer: 59

## 2015-06-14 ENCOUNTER — Telehealth: Payer: Self-pay

## 2015-06-14 VITALS — BP 138/84 | HR 118 | Ht 62.0 in | Wt 150.0 lb

## 2015-06-14 DIAGNOSIS — Z5181 Encounter for therapeutic drug level monitoring: Secondary | ICD-10-CM

## 2015-06-14 DIAGNOSIS — J8410 Pulmonary fibrosis, unspecified: Secondary | ICD-10-CM

## 2015-06-14 DIAGNOSIS — Z8619 Personal history of other infectious and parasitic diseases: Secondary | ICD-10-CM | POA: Diagnosis not present

## 2015-06-14 DIAGNOSIS — J9611 Chronic respiratory failure with hypoxia: Secondary | ICD-10-CM

## 2015-06-14 LAB — CBC WITH DIFFERENTIAL/PLATELET
BASOS PCT: 0.2 % (ref 0.0–3.0)
Basophils Absolute: 0 10*3/uL (ref 0.0–0.1)
EOS PCT: 0.1 % (ref 0.0–5.0)
Eosinophils Absolute: 0 10*3/uL (ref 0.0–0.7)
HCT: 44.2 % (ref 36.0–46.0)
Hemoglobin: 14.6 g/dL (ref 12.0–15.0)
LYMPHS ABS: 1.3 10*3/uL (ref 0.7–4.0)
Lymphocytes Relative: 6.5 % — ABNORMAL LOW (ref 12.0–46.0)
MCHC: 33.2 g/dL (ref 30.0–36.0)
MCV: 91.3 fl (ref 78.0–100.0)
MONO ABS: 0.6 10*3/uL (ref 0.1–1.0)
Monocytes Relative: 3 % (ref 3.0–12.0)
NEUTROS ABS: 17.5 10*3/uL — AB (ref 1.4–7.7)
Neutrophils Relative %: 90.2 % — ABNORMAL HIGH (ref 43.0–77.0)
Platelets: 271 10*3/uL (ref 150.0–400.0)
RBC: 4.84 Mil/uL (ref 3.87–5.11)
RDW: 14 % (ref 11.5–15.5)
WBC: 19.5 10*3/uL (ref 4.0–10.5)

## 2015-06-14 LAB — COMPREHENSIVE METABOLIC PANEL
ALT: 32 U/L (ref 0–35)
AST: 25 U/L (ref 0–37)
Albumin: 4.2 g/dL (ref 3.5–5.2)
Alkaline Phosphatase: 69 U/L (ref 39–117)
BUN: 22 mg/dL (ref 6–23)
CHLORIDE: 103 meq/L (ref 96–112)
CO2: 30 mEq/L (ref 19–32)
CREATININE: 1.05 mg/dL (ref 0.40–1.20)
Calcium: 10 mg/dL (ref 8.4–10.5)
GFR: 68.38 mL/min (ref >60.00)
GLUCOSE: 130 mg/dL — AB (ref 70–99)
POTASSIUM: 4.4 meq/L (ref 3.5–5.1)
SODIUM: 141 meq/L (ref 135–145)
Total Bilirubin: 0.3 mg/dL (ref 0.2–1.2)
Total Protein: 7.4 g/dL (ref 6.0–8.3)

## 2015-06-14 MED ORDER — AZATHIOPRINE 50 MG PO TABS: ORAL_TABLET | ORAL | Status: DC

## 2015-06-14 MED ORDER — SULFAMETHOXAZOLE-TRIMETHOPRIM 800-160 MG PO TABS: 1.0000 | ORAL_TABLET | ORAL | Status: DC

## 2015-06-14 NOTE — Assessment & Plan Note (Signed)
She had her hepatitis C successfully treated with Harvoni this year and her follow-up lab work testing has shown no evidence of ongoing infection.  Because of this, I'm going to start back her immunosuppression. We will be cautious in our titration of Imuran considering her recent hepatitis C infection.

## 2015-06-14 NOTE — Assessment & Plan Note (Addendum)
I'm not pleased with Larin's lack of progress. Since the last visit she has gained weight, she has not been exercising, and she has not rescheduled her pulmonary function testing. To some degree the weight gain is our fall because she's been treated with prednisone and now hopefully we will be able to back off on that by adding Imuran.  However, I explained to her at length today that she really needs to start exercising regularly and lose weight as she may ultimately need a lung transplant and she needs to keep her weight down and be compliant if she wants to be considered for this one day.  On hopeful that starting Imuran will help with her or not disease and oxygenation as she does appear to have strong inflammatory component. Now that her hepatitis C has been treated think we can consider starting Imuran again.  Plan: PFT and 6 minute walk NOW We will collect basic metabolic panel, LFT, and CBC every other week for the next 3 months Start Imuran 25 mg daily, increase by 25 mg every 2 weeks to a goal dose of 75 mg to start, if she tolerates this then we will push the dose further given her recent treatment of hepatitis C I want to watch her LFTs closely. Once she reaches 75 mg daily of Imuran she can cut the dose of prednisone down to 10 mg daily Take Bactrim double strength Monday Wednesday Friday while taking Imuran and prednisone Exercise regularly, lose weight Follow-up 3 months

## 2015-06-14 NOTE — Assessment & Plan Note (Signed)
COntinue 3 L O2 rest, 4-6LPM with exertion.  We will contact [employer] to see what options we have for her to have continuous flow O2 at 6L to use at her local gym.

## 2015-06-14 NOTE — Progress Notes (Signed)
Subjective:    Patient ID: Erika Martinez, female    DOB: 08/06/1955, 60 y.o.   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  She was found to have connective tissue disease associated UIP and was referred to the [facility] center.  She was found to have Hepatitis C in 2015 and her transplant evaluation has been held further as of 06/2014.  2007 CXR > question ILD 08/2013 CXR > clear progression of interstitial infiltrate and volume loss 08/2013 cXR > pneumonia 10/06/2013 CT chest ([place] read) > compatible with UIP, mildly enlarged right paratracheal lymph node, likely old granulomatous disease, mild cardiomegaly 10/06/2013 pulmonary function test [facility]> flow volume loop compatible with obstruction, ratio 77%, FEV1 0.83 L (39% predicted, -4% change with bronchodilator), unable to perform lung volumes, DLCO 9.3 (54% predicted) 12/09/2014 6MW > 990 feet, O2 saturation 80% on 2L > improved with 3L 01/2014 ANA neg, RF positive 44.7, SCL-70 neg, SSA neg, SSB neg, CCP negative, adolase normal, CRP 0.35 (normal) 02/2014 Started prednisone, referred to [facility] 03/01/2014 6MW 900 feet, dropped to 79% on 3L [place] 03/09/2014 Ratio 77%, FEV1 0.70L (33% pred), FVC 0.91L (32% pred), DLCO 5.4 (31% pred) 08/08/2014 PFT> Ratio 63% post (but 78% pre), FEV1 0.74L (39% pred), FVC 1.18L (48% pred),   HPI Chief Complaint  Patient presents with  . Follow-up    pt c/o increased sob-attributes this to weight gain.  Pt also notes increased fatigue.  did not have 62mw or pft yet-needs this rescheduled.    Erika Martinez really wants to lose weight. She says that her weight is way up on the prednisone.  She hasn't been exercising because she can't keep her oxygen up with her current oxygen.  She feels that she needs high continuous oxygen flow when.  She needs 6L  continuous while on the treadmill, 4L on the bike. She had a runny nose right before Christmas and she ended up taking a Zpack which helped. She has had more dyspnea with the weather changes recently.   Past Medical History  Diagnosis Date  . Asthma   . Mitral valve prolapse   . Seasonal allergies   . Hypertension   . Abnormal hemoglobin (HCC)   . Plantar wart   . Symptomatic menopausal or female climacteric states   . Scleroderma ([place])     Dr. Pennie Banter  . Pulmonary fibrosis ([place])   . Allergy   . Oxygen dependent   . Erythrocytosis   . Hep C w/o coma, chronic ([place])   . Lumbago   . Anxiety   . GERD (gastroesophageal reflux disease)   . IBS (irritable bowel syndrome)   . Controlled insomnia   . Spondylolisthesis      Review of Systems  Constitutional: Negative for fever, chills and fatigue.  HENT: Negative for postnasal drip, rhinorrhea and sinus pressure.   Respiratory: Positive for shortness of breath. Negative for cough and wheezing.   Cardiovascular: Negative for chest pain, palpitations and leg swelling.       Objective:   Physical Exam  Filed Vitals:   06/14/15 1417  BP: 138/84  Pulse: 118  Height: 5\' 2"  (1.575 m)  Weight: 150 lb (68.04 kg)  SpO2: 91%  2L [place]   Gen: well appearing, no acute distress HEENT: NCAT, EOMi, OP clear, cushingoid faces PULM: Crackles bilateral bases, normal effort CV:  RRR, no mgr, no JVD AB: BS+, soft, nontender, no hsm Ext: warm, no edema, + clubbing, no cyanosis Derm: no rash or skin breakdown  Her GI physician's notes reviewed> Hep C successfully treated  CMP Latest Ref Rng 06/14/2015 108/06/2014 09/05/2013  Glucose 70 - 99 mg/dL 130(H) 106(H) 101(H)  BUN 6 - 23 mg/dL 22 18 8   Creatinine 0.40 - 1.20 mg/dL 1.05 1.07(H) 1.01  Sodium 135 - 145 mEq/L 141 145(H) 140  Potassium 3.5 - 5.1 mEq/L 4.4 4.2 3.6  Chloride 96 - 112 mEq/L 103 103 110(H)  CO2 19 - 32 mEq/L 30 25 26   Calcium 8.4 - 10.5 mg/dL 10.0 9.8 8.1(L)  Total Protein  6.0 - 8.3 g/dL 7.4 6.9 -  Total Bilirubin 0.2 - 1.2 mg/dL 0.3 0.3 -  Alkaline Phos 39 - 117 U/L 69 74 -  AST 0 - 37 U/L 25 29 -  ALT 0 - 35 U/L 32 32 -        Assessment & Plan:   Postinflammatory pulmonary fibrosis (HCC) I'm not pleased with Erika Martinez's lack of progress. Since the last visit she has gained weight, she has not been exercising, and she has not rescheduled her pulmonary function testing. To some degree the weight gain is our fall because she's been treated with prednisone and now hopefully we will be able to back off on that by adding Imuran.  However, I explained to her at length today that she really needs to start exercising regularly and lose weight as she may ultimately need a lung transplant and she needs to keep her weight down and be compliant if she wants to be considered for this one day.  On hopeful that starting Imuran will help with her or not disease and oxygenation as she does appear to have strong inflammatory component. Now that her hepatitis C has been treated think we can consider starting Imuran again.  Plan: PFT and 6 minute walk NOW We will collect basic metabolic panel, LFT, and CBC every other week for the next 3 months Start Imuran 25 mg daily, increase by 25 mg every 2 weeks to a goal dose of 75 mg to start, if she tolerates this then we will push the dose further given her recent treatment of hepatitis C I want to watch her LFTs closely. Once she reaches 75 mg daily of Imuran she can cut the dose of prednisone down to 10 mg daily Take Bactrim double strength Monday Wednesday Friday while taking Imuran and prednisone Exercise regularly, lose weight Follow-up 3 months  History of hepatitis C She had her hepatitis C successfully treated with Harvoni this year and her follow-up lab work testing has shown no evidence of ongoing infection.  Because of this, I'm going to start back her immunosuppression. We will be cautious in our titration of Imuran  considering her recent hepatitis C infection.  Chronic hypoxemic respiratory failure (HCC) COntinue 3 L O2 rest, 4-6LPM with exertion.  We will contact [employer] to see what options we have for her to have continuous flow O2 at 6L to use at her local gym.  > 25 minutes spent today in clinic with Erika Martinez  Updated Medication List Outpatient Encounter Prescriptions as of 06/14/2015  Medication Sig  . ADVAIR DISKUS 500-50 MCG/DOSE AEPB INHALE 1 PUFF INTO THE LUNGS 2 (TWO) TIMES DAILY.  Marland Kitchen albuterol (PROVENTIL HFA;VENTOLIN HFA) 108 (90 BASE) MCG/ACT inhaler Inhale 2 puffs into the lungs every 4 (four) hours as needed for wheezing or  shortness of breath.  . ALPRAZolam (XANAX) 0.5 MG tablet TAKE 1 TABLET BY MOUTH3 TIMES DAILY AS NEEDED FOR ANXIETY  . cholecalciferol (VITAMIN D) 1000 UNITS tablet Take 2,000 Units by mouth daily.  Marland Kitchen diltiazem (CARDIZEM) 60 MG tablet TAKE 1 TABLET (60 MG TOTAL) BY MOUTH DAILY.  Marland Kitchen DYMISTA 137-50 MCG/ACT SUSP SPRAY ONCE IN EACH NOSTRIL TWICE DAILY  . nystatin (MYCOSTATIN) 100000 UNIT/ML suspension TAKE 1 TABLESPOON BY MOUTH BEFORE MEALS AND AT BEDTIME (SWISH AND SPIT OUT) (Patient taking differently: TAKE 1 TABLESPOON BY MOUTH BEFORE MEALS AND AT BEDTIME (SWISH AND SPIT OUT) as needed)  . omeprazole (PRILOSEC) 40 MG capsule Take 1 capsule (40 mg total) by mouth daily.  . predniSONE (DELTASONE) 20 MG tablet TAKE 1 TABLET (20 MG TOTAL) BY MOUTH DAILY.  . vitamin B-12 (CYANOCOBALAMIN) 250 MCG tablet Place 250 mcg under the tongue every other day.   . azaTHIOprine (IMURAN) 50 MG tablet Take 25mg  daily for two weeks, then 50mg  daily for two weeks, then 75mg  daily until you see me next  . loratadine (CLARITIN) 10 MG tablet Take 1 tablet (10 mg total) by mouth 2 (two) times daily. (Patient not taking: Reported on 06/14/2015)  . sulfamethoxazole-trimethoprim (BACTRIM DS,SEPTRA DS) 800-160 MG tablet Take 1 tablet by mouth 3 (three) times a week.  . [DISCONTINUED] DULoxetine (CYMBALTA) 30  MG capsule Take 1 capsule (30 mg total) by mouth daily. (Patient not taking: Reported on 06/14/2015)  . [DISCONTINUED] montelukast (SINGULAIR) 10 MG tablet Take 1 tablet (10 mg total) by mouth at bedtime. (Patient not taking: Reported on 06/14/2015)  . [DISCONTINUED] traZODone (DESYREL) 50 MG tablet Take 0.5-1 tablets (25-50 mg total) by mouth at bedtime as needed for sleep. (Patient not taking: Reported on 06/14/2015)   No facility-administered encounter medications on file as of 06/14/2015.

## 2015-06-14 NOTE — Telephone Encounter (Signed)
Called and spoke with Bethanne Ginger at [facility]: pt needs larger 02 tanks at home to accomodate a 6lpm liter flow with heavy exertion.  Pt now has small tanks and a POC that go up to 3lpm which accomodates her with typical daily liter flow.  Per BQ pt needs to increase exercise at home in an attempt to increase stamina, encourage weight loss to see if pt qualifies for lung transplant.   Bethanne Ginger states she will talk to respiratory team and see what pt's options are for her 02 at home.  Will await call.

## 2015-06-14 NOTE — Patient Instructions (Addendum)
Start taking Imuran 25 mg daily, by 25 mg every other week until a final dose of 75 milligrams daily  We will obtain a kidney and liver function tests and a complete blood count every two weeks for three months.  If all those tests are normal for the first 6 months then we will check them every three months indefinitely.  We will also collect a blood test that helps [place] understand if you can take this medication without risk of side effects.  Once you get to 75 mg of Imuran, then decrease the prednisone to 10mg  daily.  When you are taking the Imuran and prednisone you need to take Bactrim, one pill ever Monday Wednesday, Friday.  Exercise more, try to lose weight.  We will see you back in 3 months or sooner if needed

## 2015-06-15 NOTE — Telephone Encounter (Signed)
atc [employer], was put through to call answering service.  Wcb.

## 2015-06-19 NOTE — Telephone Encounter (Signed)
Spoke with Clair Gulling at [place], states that d/t pt's need for mobility AND high 02 flow he will have the respiratory team evaluate pt to see if she could work with liquid 02.  Clair Gulling will send the report to our office after pt has been evaluated to see if this is how BQ would like to proceed.   Called pt to make aware of this.  Also scheduled PFT and 10mw in [place] on different days at pt's request.  Order placed for this.   Also reviewed pt getting labwork q2w on her new med.  Pt aware.   Forwarding to BQ as FYI.

## 2015-06-20 NOTE — Telephone Encounter (Signed)
Liquid O2 OK by me

## 2015-06-26 ENCOUNTER — Encounter: Payer: 59 | Admitting: Family Medicine

## 2015-07-03 ENCOUNTER — Encounter: Payer: Self-pay | Admitting: Family Medicine

## 2015-07-03 ENCOUNTER — Ambulatory Visit (INDEPENDENT_AMBULATORY_CARE_PROVIDER_SITE_OTHER): Payer: 59 | Admitting: Family Medicine

## 2015-07-03 VITALS — BP 124/80 | HR 61 | Temp 98.0°F | Resp 12 | Ht 62.0 in | Wt 151.0 lb

## 2015-07-03 DIAGNOSIS — R739 Hyperglycemia, unspecified: Secondary | ICD-10-CM

## 2015-07-03 DIAGNOSIS — J8410 Pulmonary fibrosis, unspecified: Secondary | ICD-10-CM | POA: Diagnosis not present

## 2015-07-03 DIAGNOSIS — F320 Major depressive disorder, single episode, mild: Secondary | ICD-10-CM | POA: Diagnosis not present

## 2015-07-03 DIAGNOSIS — J9611 Chronic respiratory failure with hypoxia: Secondary | ICD-10-CM

## 2015-07-03 DIAGNOSIS — Z9981 Dependence on supplemental oxygen: Secondary | ICD-10-CM

## 2015-07-03 NOTE — Progress Notes (Signed)
Name: Erika Martinez   MRN: [id number]    DOB: 1955/08/29   Date:07/03/2015       Progress Note  Subjective  Chief Complaint  Chief Complaint  Patient presents with  . Advice Only    FMLA paperwork    HPI  Pulmonary fibrosis: she was diagnosed in 09/2013 secondary to scleroderma, she see pulmonologist - Dr. Lake Bells, and tried to get in the transplant list at [facility] but was denied last year because she was HCV positive, she has been treated with Havoni and in June titer was negative, and repeat titer negative 05/2015. She does not have liver cirrhosis. She is on oxygen 3 liters at rest and advised by pulmonologist to 4-6 liters during activity ( at the gym ), she is now on Imuran, and bactrin three times weekly, weaning off prednisone since HCV titers are back to normal and hopefully she will be able to lose weight. She brought FMLA forms, she has not missed work in 2016, but forms needs to be filled out again.   Major Depression: she feels tired, no motivation, upset about the weight gain secondary to prednisone, tired of having to drag oxygen tank around. She is feeling a lot better than one month ago. 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 She was given, she is taking Alprazolam prn only for anxiety. '  Hyperglycemia: she had labs done by Dr. Lake Bells and glucose was over 130. She has noticed polyuria and polyphagia - since started on prednisone, no polydipsia until recently. She has been craving sweets, gained a lot of weight since started on prednisone for the past 5 months.    Patient Active Problem List   Diagnosis Date Noted  . Mild major depression ([place]) 06/07/2015  . Pulmonary fibrosis ([place]) 04/06/2015  . Hypertension, benign 04/06/2015  . History of hepatitis C 02/02/2015  . Insomnia 02/02/2015  . Gastro-esophageal reflux disease without esophagitis 02/02/2015  . IBS (irritable bowel syndrome) 02/02/2015  . Low serum cobalamin 02/02/2015  . Dependence on  supplemental oxygen 02/02/2015  . Perennial allergic rhinitis with seasonal variation 02/02/2015  . Scleroderma ([place]) 02/02/2015  . SPL (spondylolisthesis) 02/02/2015  . Supraventricular tachycardia ([place]) 02/02/2015  . Vitamin D deficiency 02/02/2015  . Anxiety 02/02/2015  . History of pneumococcal pneumonia 09/13/2013  . Chronic hypoxemic respiratory failure ([place]) 09/13/2013  . Polycythemia, secondary 09/13/2013  . Postinflammatory pulmonary fibrosis ([place]) 09/13/2013    Past Surgical History  Procedure Laterality Date  . Vaginal hysterectomy    . Abdominal hysterectomy      Family History  Problem Relation Age of Onset  . Lung cancer Father   . Cancer Father   . Alzheimer's disease Mother     Social History   Social History  . Marital Status: Divorced    Spouse Name: N/A  . Number of Children: N/A  . Years of Education: N/A   Occupational History  . Not on file.   Social History Main Topics  . Smoking status: Never Smoker   . Smokeless tobacco: Never Used  . Alcohol Use: 2.0 oz/week    4 Standard drinks or equivalent per week     Comment: "social drinker"  . Drug Use: No  . Sexual Activity: Not on file   Other Topics Concern  . Not on file   Social History Narrative     Current outpatient prescriptions:  .  ADVAIR DISKUS 500-50 MCG/DOSE AEPB, INHALE 1 PUFF INTO THE LUNGS 2 (TWO)  TIMES DAILY., Disp: 60 each, Rfl: 5 .  albuterol (PROVENTIL HFA;VENTOLIN HFA) 108 (90 BASE) MCG/ACT inhaler, Inhale 2 puffs into the lungs every 4 (four) hours as needed for wheezing or shortness of breath., Disp: 8 g, Rfl: 5 .  ALPRAZolam (XANAX) 0.5 MG tablet, TAKE 1 TABLET BY MOUTH3 TIMES DAILY AS NEEDED FOR ANXIETY, Disp: 45 tablet, Rfl: 2 .  azaTHIOprine (IMURAN) 50 MG tablet, Take 52m daily for two weeks, then 527mdaily for two weeks, then 7530maily until you see me next, Disp: 60 tablet, Rfl: 2 .  cholecalciferol (VITAMIN D) 1000 UNITS tablet, Take 2,000 Units by mouth  daily., Disp: , Rfl:  .  diltiazem (CARDIZEM) 60 MG tablet, TAKE 1 TABLET (60 MG TOTAL) BY MOUTH DAILY., Disp: 30 tablet, Rfl: 2 .  DYMISTA 137-50 MCG/ACT SUSP, SPRAY ONCE IN EACH NOSTRIL TWICE DAILY, Disp: , Rfl: 12 .  loratadine (CLARITIN) 10 MG tablet, Take 1 tablet (10 mg total) by mouth 2 (two) times daily., Disp: 60 tablet, Rfl: 3 .  montelukast (SINGULAIR) 10 MG tablet, TAKE 1 TABLET (10 MG TOTAL) BY MOUTH AT BEDTIME., Disp: , Rfl: 3 .  nystatin (MYCOSTATIN) 100000 UNIT/ML suspension, TAKE 1 TABLESPOON BY MOUTH BEFORE MEALS AND AT BEDTIME (SWISH AND SPIT OUT) (Patient taking differently: TAKE 1 TABLESPOON BY MOUTH BEFORE MEALS AND AT BEDTIME (SWISH AND SPIT OUT) as needed), Disp: 473 mL, Rfl: 2 .  omeprazole (PRILOSEC) 40 MG capsule, Take 1 capsule (40 mg total) by mouth daily., Disp: 30 capsule, Rfl: 3 .  predniSONE (DELTASONE) 20 MG tablet, TAKE 1 TABLET (20 MG TOTAL) BY MOUTH DAILY., Disp: 30 tablet, Rfl: 3 .  sulfamethoxazole-trimethoprim (BACTRIM DS,SEPTRA DS) 800-160 MG tablet, Take 1 tablet by mouth 3 (three) times a week., Disp: 12 tablet, Rfl: 5 .  traZODone (DESYREL) 50 MG tablet, TAKE 0.5-1 TABLETS (25-50 MG TOTAL) BY MOUTH AT BEDTIME AS NEEDED FOR SLEEP., Disp: , Rfl: 3 .  vitamin B-12 (CYANOCOBALAMIN) 250 MCG tablet, Place 250 mcg under the tongue every other day. , Disp: , Rfl:   No Known Allergies   ROS  Constitutional: Negative for fever or some  weight change.  Respiratory: Positive for cough and shortness of breath.   Cardiovascular: Negative for chest pain or palpitations.  Gastrointestinal: Negative for abdominal pain, no bowel changes.  Musculoskeletal: Negative for gait problem or joint swelling.  Skin: Negative for rash.  Neurological: Negative for dizziness or headache.  No other specific complaints in a complete review of systems (except as listed in HPI above).  Objective  Filed Vitals:   07/03/15 1602  BP: 124/80  Pulse: 61  Temp: 98 F (36.7 C)   TempSrc: Oral  Resp: 12  Height: '5\' 2"'  (1.575 m)  Weight: 151 lb (68.493 kg)  SpO2: 100%    Body mass index is 27.61 kg/(m^2).  Physical Exam  Constitutional: Patient appears well-developed . Obese Mild respiratory  distress.  HEENT: head atraumatic, normocephalic, pupils equal and reactive to light, ears TM, neck supple, throat within normal limits Cardiovascular: Normal rate ( gets a little tachycardic when talking ) regular rhythm and normal heart sounds. No murmur heard. No BLE edema. Pulmonary/Chest: on nasal canula oxygen, crackles on both bases - stable Abdominal: Soft. There is no tenderness. Psychiatric: Patient has a normal mood and affect. behavior is normal. Judgment and thought content normal.  Recent Results (from the past 2160 hour(s))  Comprehensive metabolic panel     Status: Abnormal   Collection  Time: 05/08/15  9:58 AM  Result Value Ref Range   Glucose 106 (H) 65 - 99 mg/dL   BUN 18 8 - 27 mg/dL   Creatinine, Ser 1.07 (H) 0.57 - 1.00 mg/dL   GFR calc non Af Amer 56 (L) >59 mL/min/1.73   GFR calc Af Amer 65 >59 mL/min/1.73   BUN/Creatinine Ratio 17 11 - 26   Sodium 145 (H) 136 - 144 mmol/L    Comment: **Effective May 21, 2015 the reference interval**   for Sodium, Serum will be changing to:                                             134 - 144    Potassium 4.2 3.5 - 5.2 mmol/L   Chloride 103 97 - 106 mmol/L    Comment: **Effective May 21, 2015 the reference interval**   for Chloride, Serum will be changing to:                                              96 - 106    CO2 25 18 - 29 mmol/L   Calcium 9.8 8.7 - 10.3 mg/dL   Total Protein 6.9 6.0 - 8.5 g/dL   Albumin 4.3 3.6 - 4.8 g/dL   Globulin, Total 2.6 1.5 - 4.5 g/dL   Albumin/Globulin Ratio 1.7 1.1 - 2.5   Bilirubin Total 0.3 0.0 - 1.2 mg/dL   Alkaline Phosphatase 74 39 - 117 IU/L   AST 29 0 - 40 IU/L   ALT 32 0 - 32 IU/L  Vitamin B12     Status: Abnormal   Collection Time: 05/08/15   9:58 AM  Result Value Ref Range   Vitamin B-12 >2000 (H) 211 - 946 pg/mL  Hematocrit     Status: None   Collection Time: 05/08/15  9:58 AM  Result Value Ref Range   Hematocrit 42.7 34.0 - 46.6 %  Comp Met (CMET)     Status: Abnormal   Collection Time: 06/14/15  3:23 PM  Result Value Ref Range   Sodium 141 135 - 145 mEq/L   Potassium 4.4 3.5 - 5.1 mEq/L   Chloride 103 96 - 112 mEq/L   CO2 30 19 - 32 mEq/L   Glucose, Bld 130 (H) 70 - 99 mg/dL   BUN 22 6 - 23 mg/dL   Creatinine, Ser 1.05 0.40 - 1.20 mg/dL   Total Bilirubin 0.3 0.2 - 1.2 mg/dL   Alkaline Phosphatase 69 39 - 117 U/L   AST 25 0 - 37 U/L   ALT 32 0 - 35 U/L   Total Protein 7.4 6.0 - 8.3 g/dL   Albumin 4.2 3.5 - 5.2 g/dL   Calcium 10.0 8.4 - 10.5 mg/dL   GFR 68.38 >60.00 mL/min  CBC w/Diff     Status: Abnormal   Collection Time: 06/14/15  3:23 PM  Result Value Ref Range   WBC 19.5 Repeated and verified X2. (HH) 4.0 - 10.5 K/uL   RBC 4.84 3.87 - 5.11 Mil/uL   Hemoglobin 14.6 12.0 - 15.0 g/dL   HCT 44.2 36.0 - 46.0 %   MCV 91.3 78.0 - 100.0 fl   MCHC 33.2 30.0 - 36.0 g/dL   RDW 14.0 11.5 -  15.5 %   Platelets 271.0 150.0 - 400.0 K/uL   Neutrophils Relative % 90.2 Repeated and verified X2. (H) 43.0 - 77.0 %   Lymphocytes Relative 6.5 Repeated and verified X2. (L) 12.0 - 46.0 %   Monocytes Relative 3.0 3.0 - 12.0 %   Eosinophils Relative 0.1 0.0 - 5.0 %   Basophils Relative 0.2 0.0 - 3.0 %   Neutro Abs 17.5 (H) 1.4 - 7.7 K/uL   Lymphs Abs 1.3 0.7 - 4.0 K/uL   Monocytes Absolute 0.6 0.1 - 1.0 K/uL   Eosinophils Absolute 0.0 0.0 - 0.7 K/uL   Basophils Absolute 0.0 0.0 - 0.1 K/uL     PHQ2/9: Depression screen [facility] 2/9 07/03/2015 04/06/2015 03/14/2015 02/02/2015 12/05/2014  Decreased Interest 0 0 0 0 1  Down, Depressed, Hopeless 0 0 0 0 0  PHQ - 2 Score 0 0 0 0 1     Fall Risk: Fall Risk  07/03/2015 105-01-2015 04/06/2015 02/23/2015 02/02/2015  Falls in the past year? No No No No No      Functional Status  Survey: Is the patient deaf or have difficulty hearing?: No Does the patient have difficulty seeing, even when wearing glasses/contacts?: Yes (glasses) Does the patient have difficulty concentrating, remembering, or making decisions?: No Does the patient have difficulty walking or climbing stairs?: No Does the patient have difficulty dressing or bathing?: No Does the patient have difficulty doing errands alone such as visiting a doctor's office or shopping?: No    Assessment & Plan  1. Pulmonary fibrosis ([place])  Continue follow up with Dr. Lake Bells and current medications   2. Chronic hypoxemic respiratory failure (HCC)  On continues oxygen  3. Dependence on supplemental oxygen  Waiting for [employer] approval for 4-6 liters oxygen to be able to go to the gym, gets SOB on  3 liters during activity  4. Mild major depression ([place])  Discussed importance of taking the medications and call back if symptoms gets worse again   6. Hyperglycemia  On steroids, check hgbA1C -  hemoglobin (Hb A1C)

## 2015-07-05 ENCOUNTER — Ambulatory Visit (INDEPENDENT_AMBULATORY_CARE_PROVIDER_SITE_OTHER): Payer: 59 | Admitting: *Deleted

## 2015-07-05 DIAGNOSIS — J8410 Pulmonary fibrosis, unspecified: Secondary | ICD-10-CM | POA: Diagnosis not present

## 2015-07-05 NOTE — Progress Notes (Signed)
SMW performed today. 

## 2015-07-06 LAB — HEMOGLOBIN A1C
Est. average glucose Bld gHb Est-mCnc: 134 mg/dL
Hgb A1c MFr Bld: 6.3 % — ABNORMAL HIGH (ref 4.8–5.6)

## 2015-07-10 ENCOUNTER — Ambulatory Visit (INDEPENDENT_AMBULATORY_CARE_PROVIDER_SITE_OTHER): Payer: 59 | Admitting: *Deleted

## 2015-07-10 DIAGNOSIS — J8410 Pulmonary fibrosis, unspecified: Secondary | ICD-10-CM | POA: Diagnosis not present

## 2015-07-10 LAB — PULMONARY FUNCTION TEST
DL/VA % PRED: 0 %
DL/VA: 0.03 ml/min/mmHg/L
DLCO UNC: 0.02 ml/min/mmHg
DLCO unc % pred: 0 %
FEF 25-75 POST: 0.48 L/s
FEF 25-75 Pre: 1.03 L/sec
FEF2575-%CHANGE-POST: -53 %
FEF2575-%PRED-POST: 25 %
FEF2575-%PRED-PRE: 55 %
FEV1-%Change-Post: -13 %
FEV1-%PRED-POST: 43 %
FEV1-%PRED-PRE: 50 %
FEV1-POST: 0.81 L
FEV1-PRE: 0.94 L
FEV1FVC-%CHANGE-POST: -9 %
FEV1FVC-%PRED-PRE: 97 %
FEV6-%CHANGE-POST: 3 %
FEV6-%PRED-PRE: 48 %
FEV6-%Pred-Post: 50 %
FEV6-Post: 1.16 L
FEV6-Pre: 1.12 L
FEV6FVC-%Pred-Post: 104 %
FEV6FVC-%Pred-Pre: 104 %
FVC-%Change-Post: -4 %
FVC-%PRED-PRE: 51 %
FVC-%Pred-Post: 48 %
FVC-POST: 1.17 L
FVC-PRE: 1.22 L
POST FEV1/FVC RATIO: 70 %
PRE FEV1/FVC RATIO: 77 %
Post FEV6/FVC ratio: 100 %
Pre FEV6/FVC Ratio: 100 %

## 2015-07-10 NOTE — Progress Notes (Signed)
PFT performed today with Nitrogen washout. 

## 2015-07-12 ENCOUNTER — Telehealth: Payer: Self-pay | Admitting: Pulmonary Disease

## 2015-07-12 ENCOUNTER — Other Ambulatory Visit
Admission: RE | Admit: 2015-07-12 | Discharge: 2015-07-12 | Disposition: A | Discharge status: Home / Self Care | Payer: 59 | Source: Ambulatory Visit | Attending: Pulmonary Disease | Admitting: Pulmonary Disease

## 2015-07-12 DIAGNOSIS — Z5181 Encounter for therapeutic drug level monitoring: Secondary | ICD-10-CM | POA: Diagnosis not present

## 2015-07-12 LAB — CBC WITH DIFFERENTIAL/PLATELET
BASOS ABS: 0 10*3/uL (ref 0–0.1)
BASOS PCT: 0 %
EOS PCT: 0 %
Eosinophils Absolute: 0 10*3/uL (ref 0–0.7)
HCT: 42.7 % (ref 35.0–47.0)
Hemoglobin: 14.3 g/dL (ref 12.0–16.0)
Lymphocytes Relative: 5 %
Lymphs Abs: 0.8 10*3/uL — ABNORMAL LOW (ref 1.0–3.6)
MCH: 29.7 pg (ref 26.0–34.0)
MCHC: 33.4 g/dL (ref 32.0–36.0)
MCV: 88.9 fL (ref 80.0–100.0)
MONO ABS: 0.7 10*3/uL (ref 0.2–0.9)
Monocytes Relative: 4 %
Neutro Abs: 16.6 10*3/uL — ABNORMAL HIGH (ref 1.4–6.5)
Neutrophils Relative %: 91 %
PLATELETS: 206 10*3/uL (ref 150–440)
RBC: 4.81 MIL/uL (ref 3.80–5.20)
RDW: 14.2 % (ref 11.5–14.5)
WBC: 18.1 10*3/uL — ABNORMAL HIGH (ref 3.6–11.0)

## 2015-07-12 LAB — COMPREHENSIVE METABOLIC PANEL
ALBUMIN: 4 g/dL (ref 3.5–5.0)
ALK PHOS: 67 U/L (ref 38–126)
ALT: 40 U/L (ref 14–54)
AST: 33 U/L (ref 15–41)
Anion gap: 7 (ref 5–15)
BILIRUBIN TOTAL: 0.2 mg/dL — AB (ref 0.3–1.2)
BUN: 26 mg/dL — AB (ref 6–20)
CALCIUM: 9.4 mg/dL (ref 8.9–10.3)
CO2: 26 mmol/L (ref 22–32)
CREATININE: 1.24 mg/dL — AB (ref 0.44–1.00)
Chloride: 104 mmol/L (ref 101–111)
GFR calc Af Amer: 53 mL/min — ABNORMAL LOW (ref >60)
GFR calc non Af Amer: 46 mL/min — ABNORMAL LOW (ref >60)
GLUCOSE: 112 mg/dL — AB (ref 65–99)
Potassium: 4.5 mmol/L (ref 3.5–5.1)
Sodium: 137 mmol/L (ref 135–145)
TOTAL PROTEIN: 7.4 g/dL (ref 6.5–8.1)

## 2015-07-12 NOTE — Telephone Encounter (Signed)
Called and spoke to pt. Pt states she has not heard anything from [place] regarding her O2 for her exercise. Called [place] and spoke to [place]. Rodena Piety states RT went to pts home and evaluated her and states she will need compressed gas - tanks, for her exercise. Rodena Piety states she would contact pt and set everything up that is needed. Called and informed pt. Pt verbalized understanding and denied any further questions or concerns at this time.

## 2015-07-12 NOTE — Telephone Encounter (Signed)
See other phone note from 2.2.17 - pt is needing tanks with a continuous flow to exercise with per BQ as her POC is not enough. Pt exercises at the apartment gym and needs mobile tanks.     Called and spoke to pt. Pt stated she received a call from [facility] and they stated they would bring out large "back up" tanks for when the power goes out. Pt informed the tech that this is not what she needed, she needs tanks that she can move around to exercise with. Pt aware [employer] will be called in the morning and she will get a return call as well. Will need to call [employer] to inform them exactly what is needed.   Will call [employer] on 2.3.17 as their office is now closed.

## 2015-07-13 ENCOUNTER — Other Ambulatory Visit: Payer: Self-pay | Admitting: Pulmonary Disease

## 2015-07-13 DIAGNOSIS — R899 Unspecified abnormal finding in specimens from other organs, systems and tissues: Secondary | ICD-10-CM

## 2015-07-13 NOTE — Telephone Encounter (Signed)
Spoke with pt, states that no tanks were delivered to her yesterday.  I advised that Rodena Piety from [facility] stated earlier today that she would be calling pt to explain what is going on.  I advised pt to call us back if she does not hear from [facility] today.  Pt expressed understanding.  Nothing further needed at this time.

## 2015-07-13 NOTE — Telephone Encounter (Signed)
Called spoke with Rodena Piety from [place]. She reports pt tank that was delivered to her yesterday can be considered as a "back up tank" but this is for her use when she exercises. Rodena Piety is going to call pt this AM to explain this to her. Pt insurance will not pay for 2 portable tanks for pt.  Called pt and LMTCB x1

## 2015-07-13 NOTE — Telephone Encounter (Signed)
[phone] at work try this number

## 2015-07-13 NOTE — Progress Notes (Signed)
Notes Recorded by Juanito Doom, MD on 07/13/2015 at 8:48 AM A, Please let her know that her kidney function was not as good on this test as previously. This is not due to the imuran but can make it have more side effects. I want her to have a repeat BMET next week on Tuesday and Wednesday. At this time I don't recommend a change to the dosing of the imuran. Thanks Brent  Pt advised of results and need for repeat lab draw. Pt voiced understanding.

## 2015-07-18 ENCOUNTER — Other Ambulatory Visit
Admission: RE | Admit: 2015-07-18 | Discharge: 2015-07-18 | Disposition: A | Discharge status: Home / Self Care | Payer: 59 | Source: Ambulatory Visit | Attending: Pulmonary Disease | Admitting: Pulmonary Disease

## 2015-07-18 DIAGNOSIS — R899 Unspecified abnormal finding in specimens from other organs, systems and tissues: Secondary | ICD-10-CM | POA: Diagnosis present

## 2015-07-18 LAB — BASIC METABOLIC PANEL
ANION GAP: 9 (ref 5–15)
BUN: 24 mg/dL — ABNORMAL HIGH (ref 6–20)
CALCIUM: 9.7 mg/dL (ref 8.9–10.3)
CHLORIDE: 101 mmol/L (ref 101–111)
CO2: 28 mmol/L (ref 22–32)
Creatinine, Ser: 1.18 mg/dL — ABNORMAL HIGH (ref 0.44–1.00)
GFR calc non Af Amer: 49 mL/min — ABNORMAL LOW (ref >60)
GFR, EST AFRICAN AMERICAN: 56 mL/min — AB (ref >60)
Glucose, Bld: 161 mg/dL — ABNORMAL HIGH (ref 65–99)
POTASSIUM: 4.6 mmol/L (ref 3.5–5.1)
Sodium: 138 mmol/L (ref 135–145)

## 2015-07-19 NOTE — Progress Notes (Signed)
May be due to bactrim.  OK with increased fluids but she still needs to talk to her PCP and consider nephrology visit.

## 2015-07-23 ENCOUNTER — Telehealth: Payer: Self-pay | Admitting: Family Medicine

## 2015-07-23 NOTE — Telephone Encounter (Signed)
Patient requesting return call °

## 2015-07-24 ENCOUNTER — Encounter: Payer: Self-pay | Admitting: Family Medicine

## 2015-07-24 ENCOUNTER — Ambulatory Visit (INDEPENDENT_AMBULATORY_CARE_PROVIDER_SITE_OTHER): Payer: 59 | Admitting: Family Medicine

## 2015-07-24 VITALS — BP 110/76 | HR 115 | Temp 97.6°F | Resp 20 | Ht 62.0 in | Wt 152.5 lb

## 2015-07-24 DIAGNOSIS — R944 Abnormal results of kidney function studies: Secondary | ICD-10-CM

## 2015-07-24 DIAGNOSIS — Z13220 Encounter for screening for lipoid disorders: Secondary | ICD-10-CM | POA: Diagnosis not present

## 2015-07-24 DIAGNOSIS — Z01419 Encounter for gynecological examination (general) (routine) without abnormal findings: Secondary | ICD-10-CM

## 2015-07-24 DIAGNOSIS — Z0000 Encounter for general adult medical examination without abnormal findings: Secondary | ICD-10-CM | POA: Diagnosis not present

## 2015-07-24 DIAGNOSIS — Z1239 Encounter for other screening for malignant neoplasm of breast: Secondary | ICD-10-CM | POA: Diagnosis not present

## 2015-07-24 DIAGNOSIS — Z1211 Encounter for screening for malignant neoplasm of colon: Secondary | ICD-10-CM

## 2015-07-24 NOTE — Progress Notes (Addendum)
Name: Erika Martinez   MRN: [id number]    DOB: 1955/09/16   Date:07/24/2015       Progress Note  Subjective  Chief Complaint  Chief Complaint  Patient presents with  . Annual Exam    HPI  Well Woman: she denies vaginal problems or urinary symptoms such as urgency or incontinence.  She is s/p hysterectomy for fibroid tumors. No cervix or need of pap smears. Due for mammogram and colon cancer screen  Decrease in GFR: checked by lung doctor, she needs to drink more water, it was improving with second labs, we will recheck labs  Patient Active Problem List   Diagnosis Date Noted  . Decreased GFR 07/24/2015  . Mild major depression ([place]) 06/07/2015  . Pulmonary fibrosis ([place]) 04/06/2015  . Hypertension, benign 04/06/2015  . History of hepatitis C 02/02/2015  . Insomnia 02/02/2015  . Gastro-esophageal reflux disease without esophagitis 02/02/2015  . IBS (irritable bowel syndrome) 02/02/2015  . Low serum cobalamin 02/02/2015  . Dependence on supplemental oxygen 02/02/2015  . Perennial allergic rhinitis with seasonal variation 02/02/2015  . Scleroderma ([place]) 02/02/2015  . SPL (spondylolisthesis) 02/02/2015  . Supraventricular tachycardia ([place]) 02/02/2015  . Vitamin D deficiency 02/02/2015  . Anxiety 02/02/2015  . History of pneumococcal pneumonia 09/13/2013  . Chronic hypoxemic respiratory failure ([place]) 09/13/2013  . Polycythemia, secondary 09/13/2013  . 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 Vaginal hysterectomy    . Abdominal hysterectomy      Family History  Problem Relation Age of Onset  . Lung cancer Father   . Cancer Father   . Alzheimer's disease Mother     Social History   Social History  . Marital Status: Divorced    Spouse Name: N/A  . Number of Children: N/A  . Years of Education: N/A   Occupational History  . Not on file.   Social History Main Topics  . Smoking status: Never  Smoker   . Smokeless tobacco: Never Used  . Alcohol Use: 2.0 oz/week    4 Standard drinks or equivalent per week     Comment: "social drinker"  . Drug Use: No  . Sexual Activity: Not on file   Other Topics Concern  . Not on file   Social History Narrative     Current outpatient prescriptions:  .  ADVAIR DISKUS 500-50 MCG/DOSE AEPB, INHALE 1 PUFF INTO THE LUNGS 2 (TWO) TIMES DAILY., Disp: 60 each, Rfl: 5 .  albuterol (PROVENTIL HFA;VENTOLIN HFA) 108 (90 BASE) MCG/ACT inhaler, Inhale 2 puffs into the lungs every 4 (four) hours as needed for wheezing or shortness of breath., Disp: 8 g, Rfl: 5 .  ALPRAZolam (XANAX) 0.5 MG tablet, TAKE 1 TABLET BY MOUTH3 TIMES DAILY AS NEEDED FOR ANXIETY, Disp: 45 tablet, Rfl: 2 .  azaTHIOprine (IMURAN) 50 MG tablet, Take '25mg'$  daily for two weeks, then '50mg'$  daily for two weeks, then '75mg'$  daily until you see me next, Disp: 60 tablet, Rfl: 2 .  cholecalciferol (VITAMIN D) 1000 UNITS tablet, Take 2,000 Units by mouth daily., Disp: , Rfl:  .  diltiazem (CARDIZEM) 60 MG tablet, TAKE 1 TABLET (60 MG TOTAL) BY MOUTH DAILY., Disp: 30 tablet, Rfl: 2 .  DYMISTA 137-50 MCG/ACT SUSP, SPRAY ONCE IN EACH NOSTRIL TWICE DAILY, Disp: , Rfl: 12 .  fluconazole (DIFLUCAN) 150 MG tablet, TAKE 1 TABLET WEEKLY AS NEEDED FOR THRUSH, Disp: , Rfl: 2 .  loratadine (CLARITIN) 10 MG  tablet, Take 1 tablet (10 mg total) by mouth 2 (two) times daily., Disp: 60 tablet, Rfl: 3 .  nystatin (MYCOSTATIN) 100000 UNIT/ML suspension, TAKE 1 TABLESPOON BY MOUTH BEFORE MEALS AND AT BEDTIME (SWISH AND SPIT OUT) (Patient taking differently: TAKE 1 TABLESPOON BY MOUTH BEFORE MEALS AND AT BEDTIME (SWISH AND SPIT OUT) as needed), Disp: 473 mL, Rfl: 2 .  omeprazole (PRILOSEC) 40 MG capsule, Take 1 capsule (40 mg total) by mouth daily., Disp: 30 capsule, Rfl: 3 .  predniSONE (DELTASONE) 20 MG tablet, TAKE 1 TABLET (20 MG TOTAL) BY MOUTH DAILY., Disp: 30 tablet, Rfl: 3 .  sulfamethoxazole-trimethoprim  (BACTRIM DS,SEPTRA DS) 800-160 MG tablet, Take 1 tablet by mouth 3 (three) times a week., Disp: 12 tablet, Rfl: 5 .  vitamin B-12 (CYANOCOBALAMIN) 250 MCG tablet, Place 250 mcg under the tongue every other day. , Disp: , Rfl:   No Known Allergies   ROS  Constitutional: Negative for fever or significant weight change.  Respiratory: Positive for chronic cough and shortness of breath.   Cardiovascular: Negative for chest pain or palpitations.  Gastrointestinal: Negative for abdominal pain, no bowel changes.  Musculoskeletal: Negative for gait problem or joint swelling.  Skin: Negative for rash.  Neurological: Negative for dizziness or headache.  No other specific complaints in a complete review of systems (except as listed in HPI above).  Objective  Filed Vitals:   07/24/15 1416  BP: 110/76  Pulse: 115  Temp: 97.6 F (36.4 C)  TempSrc: Oral  Resp: 20  Height: '5\' 2"'$  (1.575 m)  Weight: 152 lb 8 oz (69.174 kg)  SpO2: 97%    Body mass index is 27.89 kg/(m^2).  Physical Exam  Constitutional: Patient appears well-developed . Mild respiratory distress  HENT: Head: Normocephalic and atraumatic. Ears: B TMs ok on left, some wax on right ear cannal, no erythema or effusion; Nose: Nose normal. Mouth/Throat: Oropharynx is clear and moist. No oropharyngeal exudate.  Eyes: Conjunctivae and EOM are normal. Pupils are equal, round, and reactive to light. No scleral icterus.  Neck: Normal range of motion. Neck supple. No JVD present. No thyromegaly present.  Cardiovascular: Normal rate, regular rhythm and normal heart sounds.  No murmur heard. No BLE edema. Pulmonary/Chest: on nasal canula oxygen, some dyspnea at rest, crackles on base Abdominal: Soft. Bowel sounds are normal, no distension. There is no tenderness. no masses Breast: no lumps or masses, no nipple discharge or rashes FEMALE GENITALIA:  Not done RECTAL: not done Musculoskeletal: Normal range of motion, no joint effusions. No  gross deformities Neurological: he is alert and oriented to person, place, and time. No cranial nerve deficit. Coordination, balance, strength, speech and gait are normal.  Skin: Skin is warm and dry. No rash noted. No erythema.  Psychiatric: Patient has a normal mood and affect. behavior is normal. Judgment and thought content normal.  Recent Results (from the past 2160 hour(s))  Comprehensive metabolic panel     Status: Abnormal   Collection Time: 05/08/15  9:58 AM  Result Value Ref Range   Glucose 106 (H) 65 - 99 mg/dL   BUN 18 8 - 27 mg/dL   Creatinine, Ser 1.07 (H) 0.57 - 1.00 mg/dL   GFR calc non Af Amer 56 (L) >59 mL/min/1.73   GFR calc Af Amer 65 >59 mL/min/1.73   BUN/Creatinine Ratio 17 11 - 26   Sodium 145 (H) 136 - 144 mmol/L    Comment: **Effective May 21, 2015 the reference interval**   for Sodium,  Serum will be changing to:                                             134 - 144    Potassium 4.2 3.5 - 5.2 mmol/L   Chloride 103 97 - 106 mmol/L    Comment: **Effective May 21, 2015 the reference interval**   for Chloride, Serum will be changing to:                                              96 - 106    CO2 25 18 - 29 mmol/L   Calcium 9.8 8.7 - 10.3 mg/dL   Total Protein 6.9 6.0 - 8.5 g/dL   Albumin 4.3 3.6 - 4.8 g/dL   Globulin, Total 2.6 1.5 - 4.5 g/dL   Albumin/Globulin Ratio 1.7 1.1 - 2.5   Bilirubin Total 0.3 0.0 - 1.2 mg/dL   Alkaline Phosphatase 74 39 - 117 IU/L   AST 29 0 - 40 IU/L   ALT 32 0 - 32 IU/L  Vitamin B12     Status: Abnormal   Collection Time: 05/08/15  9:58 AM  Result Value Ref Range   Vitamin B-12 >2000 (H) 211 - 946 pg/mL  Hematocrit     Status: None   Collection Time: 05/08/15  9:58 AM  Result Value Ref Range   Hematocrit 42.7 34.0 - 46.6 %  Comp Met (CMET)     Status: Abnormal   Collection Time: 06/14/15  3:23 PM  Result Value Ref Range   Sodium 141 135 - 145 mEq/L   Potassium 4.4 3.5 - 5.1 mEq/L   Chloride 103 96 - 112  mEq/L   CO2 30 19 - 32 mEq/L   Glucose, Bld 130 (H) 70 - 99 mg/dL   BUN 22 6 - 23 mg/dL   Creatinine, Ser 1.05 0.40 - 1.20 mg/dL   Total Bilirubin 0.3 0.2 - 1.2 mg/dL   Alkaline Phosphatase 69 39 - 117 U/L   AST 25 0 - 37 U/L   ALT 32 0 - 35 U/L   Total Protein 7.4 6.0 - 8.3 g/dL   Albumin 4.2 3.5 - 5.2 g/dL   Calcium 10.0 8.4 - 10.5 mg/dL   GFR 68.38 >60.00 mL/min  CBC w/Diff     Status: Abnormal   Collection Time: 06/14/15  3:23 PM  Result Value Ref Range   WBC 19.5 Repeated and verified X2. (HH) 4.0 - 10.5 K/uL   RBC 4.84 3.87 - 5.11 Mil/uL   Hemoglobin 14.6 12.0 - 15.0 g/dL   HCT 44.2 36.0 - 46.0 %   MCV 91.3 78.0 - 100.0 fl   MCHC 33.2 30.0 - 36.0 g/dL   RDW 14.0 11.5 - 15.5 %   Platelets 271.0 150.0 - 400.0 K/uL   Neutrophils Relative % 90.2 Repeated and verified X2. (H) 43.0 - 77.0 %   Lymphocytes Relative 6.5 Repeated and verified X2. (L) 12.0 - 46.0 %   Monocytes Relative 3.0 3.0 - 12.0 %   Eosinophils Relative 0.1 0.0 - 5.0 %   Basophils Relative 0.2 0.0 - 3.0 %   Neutro Abs 17.5 (H) 1.4 - 7.7 K/uL   Lymphs Abs 1.3 0.7 - 4.0 K/uL   Monocytes  Absolute 0.6 0.1 - 1.0 K/uL   Eosinophils Absolute 0.0 0.0 - 0.7 K/uL   Basophils Absolute 0.0 0.0 - 0.1 K/uL  Hemoglobin A1c     Status: Abnormal   Collection Time: 07/05/15  3:14 PM  Result Value Ref Range   Hgb A1c MFr Bld 6.3 (H) 4.8 - 5.6 %    Comment:          Pre-diabetes: 5.7 - 6.4          Diabetes: >6.4          Glycemic control for adults with diabetes: <7.0    Est. average glucose Bld gHb Est-mCnc 134 mg/dL  Pulmonary function test     Status: None (Preliminary result)   Collection Time: 07/10/15  2:57 PM  Result Value Ref Range   FVC-Pre 1.22 L   FVC-%Pred-Pre 51 %   FVC-Post 1.17 L   FVC-%Pred-Post 48 %   FVC-%Change-Post -4 %   FEV1-Pre 0.94 L   FEV1-%Pred-Pre 50 %   FEV1-Post 0.81 L   FEV1-%Pred-Post 43 %   FEV1-%Change-Post -13 %   FEV6-Pre 1.12 L   FEV6-%Pred-Pre 48 %   FEV6-Post 1.16 L    FEV6-%Pred-Post 50 %   FEV6-%Change-Post 3 %   Pre FEV1/FVC ratio 77 %   FEV1FVC-%Pred-Pre 97 %   Post FEV1/FVC ratio 70 %   FEV1FVC-%Change-Post -9 %   Pre FEV6/FVC Ratio 100 %   FEV6FVC-%Pred-Pre 104 %   Post FEV6/FVC ratio 100 %   FEV6FVC-%Pred-Post 104 %   FEF 25-75 Pre 1.03 L/sec   FEF2575-%Pred-Pre 55 %   FEF 25-75 Post 0.48 L/sec   FEF2575-%Pred-Post 25 %   FEF2575-%Change-Post -53 %   DLCO unc 0.02 ml/min/mmHg   DLCO unc % pred 0 %   DL/VA 0.08 ml/min/mmHg/L   DL/VA % pred 0 %  Comp Met (CMET)     Status: Abnormal   Collection Time: 07/12/15  4:53 PM  Result Value Ref Range   Sodium 137 135 - 145 mmol/L   Potassium 4.5 3.5 - 5.1 mmol/L   Chloride 104 101 - 111 mmol/L   CO2 26 22 - 32 mmol/L   Glucose, Bld 112 (H) 65 - 99 mg/dL   BUN 26 (H) 6 - 20 mg/dL   Creatinine, Ser 3.87 (H) 0.44 - 1.00 mg/dL   Calcium 9.4 8.9 - 82.8 mg/dL   Total Protein 7.4 6.5 - 8.1 g/dL   Albumin 4.0 3.5 - 5.0 g/dL   AST 33 15 - 41 U/L   ALT 40 14 - 54 U/L   Alkaline Phosphatase 67 38 - 126 U/L   Total Bilirubin 0.2 (L) 0.3 - 1.2 mg/dL   GFR calc non Af Amer 46 (L) >60 mL/min   GFR calc Af Amer 53 (L) >60 mL/min    Comment: (NOTE) The eGFR has been calculated using the CKD EPI equation. This calculation has not been validated in all clinical situations. eGFR's persistently <60 mL/min signify possible Chronic Kidney Disease.    Anion gap 7 5 - 15  CBC w/Diff     Status: Abnormal   Collection Time: 07/12/15  4:53 PM  Result Value Ref Range   WBC 18.1 (H) 3.6 - 11.0 K/uL   RBC 4.81 3.80 - 5.20 MIL/uL   Hemoglobin 14.3 12.0 - 16.0 g/dL   HCT 07.6 66.6 - 23.1 %   MCV 88.9 80.0 - 100.0 fL   MCH 29.7 26.0 - 34.0 pg   MCHC 33.4  32.0 - 36.0 g/dL   RDW 14.2 11.5 - 14.5 %   Platelets 206 150 - 440 K/uL   Neutrophils Relative % 91 %   Neutro Abs 16.6 (H) 1.4 - 6.5 K/uL   Lymphocytes Relative 5 %   Lymphs Abs 0.8 (L) 1.0 - 3.6 K/uL   Monocytes Relative 4 %   Monocytes Absolute 0.7 0.2  - 0.9 K/uL   Eosinophils Relative 0 %   Eosinophils Absolute 0.0 0 - 0.7 K/uL   Basophils Relative 0 %   Basophils Absolute 0.0 0 - 0.1 K/uL  Basic metabolic panel     Status: Abnormal   Collection Time: 07/18/15  3:34 PM  Result Value Ref Range   Sodium 138 135 - 145 mmol/L   Potassium 4.6 3.5 - 5.1 mmol/L   Chloride 101 101 - 111 mmol/L   CO2 28 22 - 32 mmol/L   Glucose, Bld 161 (H) 65 - 99 mg/dL   BUN 24 (H) 6 - 20 mg/dL   Creatinine, Ser 1.18 (H) 0.44 - 1.00 mg/dL   Calcium 9.7 8.9 - 10.3 mg/dL   GFR calc non Af Amer 49 (L) >60 mL/min   GFR calc Af Amer 56 (L) >60 mL/min    Comment: (NOTE) The eGFR has been calculated using the CKD EPI equation. This calculation has not been validated in all clinical situations. eGFR's persistently <60 mL/min signify possible Chronic Kidney Disease.    Anion gap 9 5 - 15    PHQ2/9: Depression screen [facility] 2/9 07/03/2015 04/06/2015 03/14/2015 02/02/2015 12/05/2014  Decreased Interest 0 0 0 0 1  Down, Depressed, Hopeless 0 0 0 0 0  PHQ - 2 Score 0 0 0 0 1     Fall Risk: Fall Risk  07/03/2015 103/12/2014 04/06/2015 02/23/2015 02/02/2015  Falls in the past year? _0      Assessment & Plan  1. Well woman exam  Discussed importance of 150 minutes of physical activity weekly, eat two servings of fish weekly, eat one serving of tree nuts ( cashews, pistachios, pecans, almonds.Marland Kitchen) every other day, eat 6 servings of fruit/vegetables daily and drink plenty of water and avoid sweet beverages.   2. Decreased GFR  - Estimated GFR  3. Breast cancer screening  - MM Digital Screening; Future  4. Colon cancer screening  - Ambulatory referral to Gastroenterology  5. Lipid screening  - Lipid panel

## 2015-07-24 NOTE — Addendum Note (Signed)
Addended by: Steele Sizer F on: 07/24/2015 02:52 PM   Modules accepted: Miquel Dunn

## 2015-07-25 ENCOUNTER — Telehealth: Payer: Self-pay | Admitting: Pulmonary Disease

## 2015-07-25 NOTE — Telephone Encounter (Signed)
Called spoke with pt. She had BMET done on 07/18/15. She is wanting to know if she needs to have this repeated.  Please advise Dr. Lake Bells thanks

## 2015-07-25 NOTE — Telephone Encounter (Signed)
I think I asked for her to have another in a month, check with Caryl Pina.  If I did not give clear direction then plan on getting one around 3/8

## 2015-07-26 NOTE — Telephone Encounter (Signed)
Per last ov note, pt was to have labs drawn q2w X3 mos.   Spoke with pt to make her aware of this.  Standing order had already been placed at last ov.   Verified dosage instructions of Bactrim with pt also.  Pt expressed understanding.  Nothing further needed.

## 2015-07-30 ENCOUNTER — Telehealth: Payer: Self-pay | Admitting: Pulmonary Disease

## 2015-07-30 DIAGNOSIS — Z5181 Encounter for therapeutic drug level monitoring: Secondary | ICD-10-CM

## 2015-07-30 DIAGNOSIS — R5382 Chronic fatigue, unspecified: Secondary | ICD-10-CM

## 2015-07-30 NOTE — Telephone Encounter (Signed)
Per 06/14/15 OV: Patient Instructions     Start taking Imuran 25 mg daily, by 25 mg every other week until a final dose of 75 milligrams daily We will obtain a kidney and liver function tests and a complete blood count every two weeks for three months. If all those tests are normal for the first 6 months then we will check them every three months indefinitely. We will also collect a blood test that helps [place] understand if you can take this medication without risk of side effects. Once you get to 75 mg of Imuran, then decrease the prednisone to 10mg  daily. When you are taking the Imuran and prednisone you need to take Bactrim, one pill ever Monday Wednesday, Friday. Exercise more, try to lose weight. We will see you back in 3 months or sooner if needed  --- Called spoke with pt. She is now on imuran 75 mg. She dropped her prednisone down to 10 mg on 07/25/15. She reports since she has been feeling very exhausted and drained. She is wanting to know if she can take pred 15 mg for a couple weeks and then drop down to 10 mg a day? She is fine with a call back tomorrow when Dr. Lake Bells is in the office. Please advise thanks

## 2015-07-31 NOTE — Telephone Encounter (Signed)
lmomtcb x1 

## 2015-07-31 NOTE — Telephone Encounter (Signed)
Pt aware that there is a standing order for the CBC/D- pt going to [facility] to have this drawn. Pt states that she does not come to [place] and wants this done in [place]. Order was placed again for CBCD & CMET standing at [facility]. Called [facility] and spoke with Jinny Blossom to confirm that they can see the order. Jinny Blossom states that she cannot see the order right now but it may take a few minutes for it to cross over. States that she will call back if she cannot find the orders in a few minutes. Will hold in triage to follow up.

## 2015-07-31 NOTE — Telephone Encounter (Signed)
Yes that is fine but have her get a cbc; reason fatigue, therapeutic drug monitoring

## 2015-07-31 NOTE — Telephone Encounter (Signed)
Spoke with Alvis Lemmings at [facility]- they cannot see the orders.  I have spoken with Misty on how to order labs for [facility] so they will cross over the computer system.  Called [facility] back and spoke with Alvis Lemmings, orders are visible and they will be able to use these when the patient comes in.  Nothing further needed.

## 2015-07-31 NOTE — Telephone Encounter (Signed)
Pt returning call and can be reached @ same.Erika Martinez ° °

## 2015-08-01 ENCOUNTER — Other Ambulatory Visit
Admission: RE | Admit: 2015-08-01 | Discharge: 2015-08-01 | Disposition: A | Discharge status: Home / Self Care | Payer: 59 | Source: Ambulatory Visit | Attending: Pulmonary Disease | Admitting: Pulmonary Disease

## 2015-08-01 ENCOUNTER — Other Ambulatory Visit: Payer: Self-pay

## 2015-08-01 ENCOUNTER — Other Ambulatory Visit: Payer: Self-pay | Admitting: Pulmonary Disease

## 2015-08-01 ENCOUNTER — Telehealth: Payer: Self-pay

## 2015-08-01 ENCOUNTER — Other Ambulatory Visit: Payer: Self-pay | Admitting: Family Medicine

## 2015-08-01 ENCOUNTER — Ambulatory Visit (INDEPENDENT_AMBULATORY_CARE_PROVIDER_SITE_OTHER): Payer: 59 | Admitting: Family Medicine

## 2015-08-01 ENCOUNTER — Encounter: Payer: Self-pay | Admitting: Family Medicine

## 2015-08-01 VITALS — BP 122/64 | HR 117 | Temp 98.5°F | Resp 20 | Ht 62.0 in | Wt 152.4 lb

## 2015-08-01 DIAGNOSIS — R509 Fever, unspecified: Secondary | ICD-10-CM | POA: Diagnosis not present

## 2015-08-01 DIAGNOSIS — Z5181 Encounter for therapeutic drug level monitoring: Secondary | ICD-10-CM | POA: Diagnosis not present

## 2015-08-01 DIAGNOSIS — D899 Disorder involving the immune mechanism, unspecified: Secondary | ICD-10-CM | POA: Diagnosis not present

## 2015-08-01 DIAGNOSIS — J3489 Other specified disorders of nose and nasal sinuses: Secondary | ICD-10-CM | POA: Diagnosis not present

## 2015-08-01 DIAGNOSIS — J8410 Pulmonary fibrosis, unspecified: Secondary | ICD-10-CM

## 2015-08-01 DIAGNOSIS — D849 Immunodeficiency, unspecified: Secondary | ICD-10-CM

## 2015-08-01 LAB — COMPREHENSIVE METABOLIC PANEL
ALBUMIN: 4.1 g/dL (ref 3.5–5.0)
ALK PHOS: 67 U/L (ref 38–126)
ALT: 40 U/L (ref 14–54)
ANION GAP: 8 (ref 5–15)
AST: 34 U/L (ref 15–41)
BILIRUBIN TOTAL: 0.2 mg/dL — AB (ref 0.3–1.2)
BUN: 19 mg/dL (ref 6–20)
CALCIUM: 9.6 mg/dL (ref 8.9–10.3)
CO2: 26 mmol/L (ref 22–32)
CREATININE: 1.13 mg/dL — AB (ref 0.44–1.00)
Chloride: 106 mmol/L (ref 101–111)
GFR calc Af Amer: 60 mL/min — ABNORMAL LOW (ref >60)
GFR calc non Af Amer: 51 mL/min — ABNORMAL LOW (ref >60)
GLUCOSE: 175 mg/dL — AB (ref 65–99)
Potassium: 4.7 mmol/L (ref 3.5–5.1)
Sodium: 140 mmol/L (ref 135–145)
TOTAL PROTEIN: 7.5 g/dL (ref 6.5–8.1)

## 2015-08-01 LAB — CBC WITH DIFFERENTIAL/PLATELET
BASOS ABS: 0 10*3/uL (ref 0–0.1)
BASOS PCT: 0 %
EOS ABS: 0 10*3/uL (ref 0–0.7)
EOS PCT: 0 %
HEMATOCRIT: 42.5 % (ref 35.0–47.0)
Hemoglobin: 13.9 g/dL (ref 12.0–16.0)
Lymphocytes Relative: 2 %
Lymphs Abs: 0.3 10*3/uL — ABNORMAL LOW (ref 1.0–3.6)
MCH: 30.1 pg (ref 26.0–34.0)
MCHC: 32.8 g/dL (ref 32.0–36.0)
MCV: 91.8 fL (ref 80.0–100.0)
MONO ABS: 0.6 10*3/uL (ref 0.2–0.9)
Monocytes Relative: 4 %
NEUTROS ABS: 15.2 10*3/uL — AB (ref 1.4–6.5)
Neutrophils Relative %: 94 %
PLATELETS: 194 10*3/uL (ref 150–440)
RBC: 4.63 MIL/uL (ref 3.80–5.20)
RDW: 15.1 % — AB (ref 11.5–14.5)
WBC: 16.2 10*3/uL — ABNORMAL HIGH (ref 3.6–11.0)

## 2015-08-01 LAB — POCT URINALYSIS DIPSTICK
Bilirubin, UA: NEGATIVE
Blood, UA: NEGATIVE
Glucose, UA: NEGATIVE
KETONES UA: NEGATIVE
Leukocytes, UA: NEGATIVE
NITRITE UA: NEGATIVE
PH UA: 5
PROTEIN UA: NEGATIVE
SPEC GRAV UA: 1.01
UROBILINOGEN UA: 0.2

## 2015-08-01 LAB — POCT INFLUENZA A/B
Influenza A, POC: NEGATIVE
Influenza B, POC: NEGATIVE

## 2015-08-01 MED ORDER — PREDNISONE 10 MG PO TABS: 15.0000 mg | ORAL_TABLET | Freq: Every day | ORAL | Status: DC

## 2015-08-01 MED ORDER — AZATHIOPRINE 50 MG PO TABS: 75.0000 mg | ORAL_TABLET | Freq: Every day | ORAL | Status: DC

## 2015-08-01 NOTE — Telephone Encounter (Signed)
Gastroenterology Pre-Procedure Review  Request Date: 09/18/15 Requesting Physician: Dr. Ancil Boozer  PATIENT REVIEW QUESTIONS: The patient responded to the following health history questions as indicated:    1. Are you having any GI issues? no 2. Do you have a personal history of Polyps? no 3. Do you have a family history of Colon Cancer or Polyps? no 4. Diabetes Mellitus? no 5. Joint replacements in the past 12 months?no 6. Major health problems in the past 3 months?no 7. Any artificial heart valves, MVP, or defibrillator?no    MEDICATIONS & ALLERGIES:    Patient reports the following regarding taking any anticoagulation/antiplatelet therapy:   Plavix, Coumadin, Eliquis, Xarelto, Lovenox, Pradaxa, Brilinta, or Effient? no Aspirin? no  Patient confirms/reports the following medications:  Current Outpatient Prescriptions  Medication Sig Dispense Refill  . ADVAIR DISKUS 500-50 MCG/DOSE AEPB INHALE 1 PUFF INTO THE LUNGS 2 (TWO) TIMES DAILY. 60 each 5  . albuterol (PROVENTIL HFA;VENTOLIN HFA) 108 (90 BASE) MCG/ACT inhaler Inhale 2 puffs into the lungs every 4 (four) hours as needed for wheezing or shortness of breath. 8 g 5  . ALPRAZolam (XANAX) 0.5 MG tablet TAKE 1 TABLET BY MOUTH3 TIMES DAILY AS NEEDED FOR ANXIETY 45 tablet 2  . azaTHIOprine (IMURAN) 50 MG tablet Take 1.5 tablets (75 mg total) by mouth daily. Daily 60 tablet 2  . cholecalciferol (VITAMIN D) 1000 UNITS tablet Take 2,000 Units by mouth daily.    Marland Kitchen diltiazem (CARDIZEM) 60 MG tablet TAKE 1 TABLET (60 MG TOTAL) BY MOUTH DAILY. 30 tablet 2  . DYMISTA 137-50 MCG/ACT SUSP SPRAY ONCE IN EACH NOSTRIL TWICE DAILY  12  . fluconazole (DIFLUCAN) 150 MG tablet TAKE 1 TABLET WEEKLY AS NEEDED FOR THRUSH  2  . loratadine (CLARITIN) 10 MG tablet Take 1 tablet (10 mg total) by mouth 2 (two) times daily. 60 tablet 3  . nystatin (MYCOSTATIN) 100000 UNIT/ML suspension TAKE 1 TABLESPOON BY MOUTH BEFORE MEALS AND AT BEDTIME (SWISH AND SPIT OUT)  (Patient taking differently: TAKE 1 TABLESPOON BY MOUTH BEFORE MEALS AND AT BEDTIME (SWISH AND SPIT OUT) as needed) 473 mL 2  . omeprazole (PRILOSEC) 40 MG capsule Take 1 capsule (40 mg total) by mouth daily. 30 capsule 3  . predniSONE (DELTASONE) 10 MG tablet Take 1.5 tablets (15 mg total) by mouth daily with breakfast. 30 tablet 0  . sulfamethoxazole-trimethoprim (BACTRIM DS,SEPTRA DS) 800-160 MG tablet Take 1 tablet by mouth 3 (three) times a week. 12 tablet 5  . vitamin B-12 (CYANOCOBALAMIN) 250 MCG tablet Place 250 mcg under the tongue every other day.      No current facility-administered medications for this visit.    Patient confirms/reports the following allergies:  No Known Allergies  No orders of the defined types were placed in this encounter.    AUTHORIZATION INFORMATION Primary Insurance: 1D#: Group #:  Secondary Insurance: 1D#: Group #:  SCHEDULE INFORMATION: Date: 09/18/15 Time: Location: [facility]

## 2015-08-01 NOTE — Telephone Encounter (Signed)
After consulting with Dr. Ancil Boozer, patient was advise to come in for an office visit since she is not able to see her Pulmonologist.

## 2015-08-01 NOTE — Telephone Encounter (Signed)
Patient woke up with a fever and had chills last night. Even after taking 2 Advil her temp is 101.   She stated that she has an order from her pulmonologist to have a cbc done but was feeling really bad and was not sure of what she should do.  Please advise.

## 2015-08-01 NOTE — Telephone Encounter (Signed)
Patient requesting refill. 

## 2015-08-01 NOTE — Progress Notes (Signed)
Name: Erika Martinez   MRN: [id number]    DOB: [birth year]   Date:08/01/2015       Progress Note  Subjective  Chief Complaint  Chief Complaint  Patient presents with  . Medication Refill    Needs refill of medication  . Gastroesophageal Reflux    Needs refill  . Anxiety    Needs refill, takes medication as needed  . Fever    Has low grade fever of 100 F last night, took Aleve due to fever and fatigue. Patient states she works at a daycare and has had people with the Flu, Pneumonia, and stomach bug-working there.  . Allergic Rhinitis     coughing and sneezing due to pollen    HPI  Fever and Chills: she is immunosuppressed. She has pulmonary fibrosis and is on Imuran and Prednisone. She states that she felt chills all night and this am her temperature was 101. She denies worsening of cough or SOB. She has noticed rhinorrhea, headache. Occasionally has discomfort when she voids but going on since she started bactrin given by pulmonologist because of immunosuppressed state. No rashes, change in bowel movements or vomiting. Denies joint pain or sore throat.   AR: worse since last week, some ear fullness over the weekend, but resolves with nasal steroid  GERD: she needs refill of Omeprazole, taking twice daily because of prednisone. No heartburn or regurgitation    Patient Active Problem List   Diagnosis Date Noted  . Decreased GFR 07/24/2015  . Mild major depression ([place]) 06/07/2015  . Pulmonary fibrosis ([place]) 04/06/2015  . Hypertension, benign 04/06/2015  . History of hepatitis C 02/02/2015  . Insomnia 02/02/2015  . Gastro-esophageal reflux disease without esophagitis 02/02/2015  . IBS (irritable bowel syndrome) 02/02/2015  . Low serum cobalamin 02/02/2015  . Dependence on supplemental oxygen 02/02/2015  . Perennial allergic rhinitis with seasonal variation 02/02/2015  . Scleroderma ([place]) 02/02/2015  . SPL (spondylolisthesis) 02/02/2015  . Supraventricular tachycardia ([place])  02/02/2015  . Vitamin D deficiency 02/02/2015  . Anxiety 02/02/2015  . History of pneumococcal pneumonia 09/13/2013  . Chronic hypoxemic respiratory failure ([place]) 09/13/2013  . Polycythemia, secondary 09/13/2013  . Postinflammatory pulmonary fibrosis ([place]) 09/13/2013    Past Surgical History  Procedure Laterality Date  . Vaginal hysterectomy    . Abdominal hysterectomy      Family History  Problem Relation Age of Onset  . Lung cancer Father   . Cancer Father   . Alzheimer's disease Mother     Social History   Social History  . Marital Status: Divorced    Spouse Name: N/A  . Number of Children: N/A  . Years of Education: N/A   Occupational History  . Not on file.   Social History Main Topics  . Smoking status: Never Smoker   . Smokeless tobacco: Never Used  . Alcohol Use: 2.0 oz/week    4 Standard drinks or equivalent per week     Comment: "social drinker"  . Drug Use: No  . Sexual Activity: Not on file   Other Topics Concern  . Not on file   Social History Narrative     Current outpatient prescriptions:  .  ADVAIR DISKUS 500-50 MCG/DOSE AEPB, INHALE 1 PUFF INTO THE LUNGS 2 (TWO) TIMES DAILY., Disp: 60 each, Rfl: 5 .  albuterol (PROVENTIL HFA;VENTOLIN HFA) 108 (90 BASE) MCG/ACT inhaler, Inhale 2 puffs into the lungs every 4 (four) hours as needed for wheezing or shortness of breath., Disp: 8 g, Rfl:  5 .  ALPRAZolam (XANAX) 0.5 MG tablet, TAKE 1 TABLET BY MOUTH3 TIMES DAILY AS NEEDED FOR ANXIETY, Disp: 45 tablet, Rfl: 2 .  azaTHIOprine (IMURAN) 50 MG tablet, Take 1.5 tablets (75 mg total) by mouth daily. Daily, Disp: 60 tablet, Rfl: 2 .  cholecalciferol (VITAMIN D) 1000 UNITS tablet, Take 2,000 Units by mouth daily., Disp: , Rfl:  .  diltiazem (CARDIZEM) 60 MG tablet, TAKE 1 TABLET (60 MG TOTAL) BY MOUTH DAILY., Disp: 30 tablet, Rfl: 2 .  DYMISTA 137-50 MCG/ACT SUSP, SPRAY ONCE IN EACH NOSTRIL TWICE DAILY, Disp: , Rfl: 12 .  fluconazole (DIFLUCAN) 150 MG tablet,  TAKE 1 TABLET WEEKLY AS NEEDED FOR THRUSH, Disp: , Rfl: 2 .  loratadine (CLARITIN) 10 MG tablet, Take 1 tablet (10 mg total) by mouth 2 (two) times daily., Disp: 60 tablet, Rfl: 3 .  nystatin (MYCOSTATIN) 100000 UNIT/ML suspension, TAKE 1 TABLESPOON BY MOUTH BEFORE MEALS AND AT BEDTIME (SWISH AND SPIT OUT) (Patient taking differently: TAKE 1 TABLESPOON BY MOUTH BEFORE MEALS AND AT BEDTIME (SWISH AND SPIT OUT) as needed), Disp: 473 mL, Rfl: 2 .  omeprazole (PRILOSEC) 40 MG capsule, Take 1 capsule (40 mg total) by mouth daily., Disp: 30 capsule, Rfl: 3 .  predniSONE (DELTASONE) 10 MG tablet, Take 1.5 tablets (15 mg total) by mouth daily with breakfast., Disp: 30 tablet, Rfl: 0 .  sulfamethoxazole-trimethoprim (BACTRIM DS,SEPTRA DS) 800-160 MG tablet, Take 1 tablet by mouth 3 (three) times a week., Disp: 12 tablet, Rfl: 5 .  vitamin B-12 (CYANOCOBALAMIN) 250 MCG tablet, Place 250 mcg under the tongue every other day. , Disp: , Rfl:   No Known Allergies   ROS  Ten systems reviewed and is negative except as mentioned in HPI   Objective  Filed Vitals:   08/01/15 1037  BP: 122/64  Pulse: 117  Temp: 98.5 F (36.9 C)  TempSrc: Oral  Resp: 20  Height: '5\' 2"'  (1.575 m)  Weight: 152 lb 6.4 oz (69.128 kg)  SpO2: 96%    Body mass index is 27.87 kg/(m^2).  Physical Exam  Constitutional: Patient appears well-developed and well-nourished.  HEENT: head atraumatic, normocephalic, pupils equal and reactive to light, ears normal TM bilaterally, neck supple, throat within normal limits, mild clear rhinorrhea Cardiovascular: Normal rate, regular rhythm and normal heart sounds.  No murmur heard. No BLE edema. Pulmonary/Chest: Effort normal mild right lung rhonchi, on nasal canula oxygen Abdominal: Soft.  There is no tenderness. Psychiatric: Patient has a normal mood and affect. behavior is normal. Judgment and thought content normal.  Recent Results (from the past 2160 hour(s))  Comprehensive  metabolic panel     Status: Abnormal   Collection Time: 05/08/15  9:58 AM  Result Value Ref Range   Glucose 106 (H) 65 - 99 mg/dL   BUN 18 8 - 27 mg/dL   Creatinine, Ser 1.07 (H) 0.57 - 1.00 mg/dL   GFR calc non Af Amer 56 (L) >59 mL/min/1.73   GFR calc Af Amer 65 >59 mL/min/1.73   BUN/Creatinine Ratio 17 11 - 26   Sodium 145 (H) 136 - 144 mmol/L    Comment: **Effective May 21, 2015 the reference interval**   for Sodium, Serum will be changing to:                                             134 -  144    Potassium 4.2 3.5 - 5.2 mmol/L   Chloride 103 97 - 106 mmol/L    Comment: **Effective May 21, 2015 the reference interval**   for Chloride, Serum will be changing to:                                              96 - 106    CO2 25 18 - 29 mmol/L   Calcium 9.8 8.7 - 10.3 mg/dL   Total Protein 6.9 6.0 - 8.5 g/dL   Albumin 4.3 3.6 - 4.8 g/dL   Globulin, Total 2.6 1.5 - 4.5 g/dL   Albumin/Globulin Ratio 1.7 1.1 - 2.5   Bilirubin Total 0.3 0.0 - 1.2 mg/dL   Alkaline Phosphatase 74 39 - 117 IU/L   AST 29 0 - 40 IU/L   ALT 32 0 - 32 IU/L  Vitamin B12     Status: Abnormal   Collection Time: 05/08/15  9:58 AM  Result Value Ref Range   Vitamin B-12 >2000 (H) 211 - 946 pg/mL  Hematocrit     Status: None   Collection Time: 05/08/15  9:58 AM  Result Value Ref Range   Hematocrit 42.7 34.0 - 46.6 %  Comp Met (CMET)     Status: Abnormal   Collection Time: 06/14/15  3:23 PM  Result Value Ref Range   Sodium 141 135 - 145 mEq/L   Potassium 4.4 3.5 - 5.1 mEq/L   Chloride 103 96 - 112 mEq/L   CO2 30 19 - 32 mEq/L   Glucose, Bld 130 (H) 70 - 99 mg/dL   BUN 22 6 - 23 mg/dL   Creatinine, Ser 1.05 0.40 - 1.20 mg/dL   Total Bilirubin 0.3 0.2 - 1.2 mg/dL   Alkaline Phosphatase 69 39 - 117 U/L   AST 25 0 - 37 U/L   ALT 32 0 - 35 U/L   Total Protein 7.4 6.0 - 8.3 g/dL   Albumin 4.2 3.5 - 5.2 g/dL   Calcium 10.0 8.4 - 10.5 mg/dL   GFR 68.38 >60.00 mL/min  CBC w/Diff     Status:  Abnormal   Collection Time: 06/14/15  3:23 PM  Result Value Ref Range   WBC 19.5 Repeated and verified X2. (HH) 4.0 - 10.5 K/uL   RBC 4.84 3.87 - 5.11 Mil/uL   Hemoglobin 14.6 12.0 - 15.0 g/dL   HCT 44.2 36.0 - 46.0 %   MCV 91.3 78.0 - 100.0 fl   MCHC 33.2 30.0 - 36.0 g/dL   RDW 14.0 11.5 - 15.5 %   Platelets 271.0 150.0 - 400.0 K/uL   Neutrophils Relative % 90.2 Repeated and verified X2. (H) 43.0 - 77.0 %   Lymphocytes Relative 6.5 Repeated and verified X2. (L) 12.0 - 46.0 %   Monocytes Relative 3.0 3.0 - 12.0 %   Eosinophils Relative 0.1 0.0 - 5.0 %   Basophils Relative 0.2 0.0 - 3.0 %   Neutro Abs 17.5 (H) 1.4 - 7.7 K/uL   Lymphs Abs 1.3 0.7 - 4.0 K/uL   Monocytes Absolute 0.6 0.1 - 1.0 K/uL   Eosinophils Absolute 0.0 0.0 - 0.7 K/uL   Basophils Absolute 0.0 0.0 - 0.1 K/uL  Hemoglobin A1c     Status: Abnormal   Collection Time: 07/05/15  3:14 PM  Result Value Ref Range   Hgb A1c MFr  Bld 6.3 (H) 4.8 - 5.6 %    Comment:          Pre-diabetes: 5.7 - 6.4          Diabetes: >6.4          Glycemic control for adults with diabetes: <7.0    Est. average glucose Bld gHb Est-mCnc 134 mg/dL  Pulmonary function test     Status: None (Preliminary result)   Collection Time: 07/10/15  2:57 PM  Result Value Ref Range   FVC-Pre 1.22 L   FVC-%Pred-Pre 51 %   FVC-Post 1.17 L   FVC-%Pred-Post 48 %   FVC-%Change-Post -4 %   FEV1-Pre 0.94 L   FEV1-%Pred-Pre 50 %   FEV1-Post 0.81 L   FEV1-%Pred-Post 43 %   FEV1-%Change-Post -13 %   FEV6-Pre 1.12 L   FEV6-%Pred-Pre 48 %   FEV6-Post 1.16 L   FEV6-%Pred-Post 50 %   FEV6-%Change-Post 3 %   Pre FEV1/FVC ratio 77 %   FEV1FVC-%Pred-Pre 97 %   Post FEV1/FVC ratio 70 %   FEV1FVC-%Change-Post -9 %   Pre FEV6/FVC Ratio 100 %   FEV6FVC-%Pred-Pre 104 %   Post FEV6/FVC ratio 100 %   FEV6FVC-%Pred-Post 104 %   FEF 25-75 Pre 1.03 L/sec   FEF2575-%Pred-Pre 55 %   FEF 25-75 Post 0.48 L/sec   FEF2575-%Pred-Post 25 %   FEF2575-%Change-Post -53 %    DLCO unc 0.02 ml/min/mmHg   DLCO unc % pred 0 %   DL/VA 0.03 ml/min/mmHg/L   DL/VA % pred 0 %  Comp Met (CMET)     Status: Abnormal   Collection Time: 07/12/15  4:53 PM  Result Value Ref Range   Sodium 137 135 - 145 mmol/L   Potassium 4.5 3.5 - 5.1 mmol/L   Chloride 104 101 - 111 mmol/L   CO2 26 22 - 32 mmol/L   Glucose, Bld 112 (H) 65 - 99 mg/dL   BUN 26 (H) 6 - 20 mg/dL   Creatinine, Ser 1.24 (H) 0.44 - 1.00 mg/dL   Calcium 9.4 8.9 - 10.3 mg/dL   Total Protein 7.4 6.5 - 8.1 g/dL   Albumin 4.0 3.5 - 5.0 g/dL   AST 33 15 - 41 U/L   ALT 40 14 - 54 U/L   Alkaline Phosphatase 67 38 - 126 U/L   Total Bilirubin 0.2 (L) 0.3 - 1.2 mg/dL   GFR calc non Af Amer 46 (L) >60 mL/min   GFR calc Af Amer 53 (L) >60 mL/min    Comment: (NOTE) The eGFR has been calculated using the CKD EPI equation. This calculation has not been validated in all clinical situations. eGFR's persistently <60 mL/min signify possible Chronic Kidney Disease.    Anion gap 7 5 - 15  CBC w/Diff     Status: Abnormal   Collection Time: 07/12/15  4:53 PM  Result Value Ref Range   WBC 18.1 (H) 3.6 - 11.0 K/uL   RBC 4.81 3.80 - 5.20 MIL/uL   Hemoglobin 14.3 12.0 - 16.0 g/dL   HCT 42.7 35.0 - 47.0 %   MCV 88.9 80.0 - 100.0 fL   MCH 29.7 26.0 - 34.0 pg   MCHC 33.4 32.0 - 36.0 g/dL   RDW 14.2 11.5 - 14.5 %   Platelets 206 150 - 440 K/uL   Neutrophils Relative % 91 %   Neutro Abs 16.6 (H) 1.4 - 6.5 K/uL   Lymphocytes Relative 5 %   Lymphs Abs 0.8 (L) 1.0 -  3.6 K/uL   Monocytes Relative 4 %   Monocytes Absolute 0.7 0.2 - 0.9 K/uL   Eosinophils Relative 0 %   Eosinophils Absolute 0.0 0 - 0.7 K/uL   Basophils Relative 0 %   Basophils Absolute 0.0 0 - 0.1 K/uL  Basic metabolic panel     Status: Abnormal   Collection Time: 07/18/15  3:34 PM  Result Value Ref Range   Sodium 138 135 - 145 mmol/L   Potassium 4.6 3.5 - 5.1 mmol/L   Chloride 101 101 - 111 mmol/L   CO2 28 22 - 32 mmol/L   Glucose, Bld 161 (H) 65 - 99  mg/dL   BUN 24 (H) 6 - 20 mg/dL   Creatinine, Ser 1.18 (H) 0.44 - 1.00 mg/dL   Calcium 9.7 8.9 - 10.3 mg/dL   GFR calc non Af Amer 49 (L) >60 mL/min   GFR calc Af Amer 56 (L) >60 mL/min    Comment: (NOTE) The eGFR has been calculated using the CKD EPI equation. This calculation has not been validated in all clinical situations. eGFR's persistently <60 mL/min signify possible Chronic Kidney Disease.    Anion gap 9 5 - 15      PHQ2/9: Depression screen [facility] 2/9 07/03/2015 04/06/2015 03/14/2015 02/02/2015 12/05/2014  Decreased Interest 0 0 0 0 1  Down, Depressed, Hopeless 0 0 0 0 0  PHQ - 2 Score 0 0 0 0 1     Fall Risk: Fall Risk  07/03/2015 1October 09, 202016 04/06/2015 02/23/2015 02/02/2015  Falls in the past year? No No No No No     Assessment & Plan  1. Pulmonary fibrosis (HCC)  - azaTHIOprine (IMURAN) 50 MG tablet; Take 1.5 tablets (75 mg total) by mouth daily. Daily  Dispense: 60 tablet; Refill: 2 - predniSONE (DELTASONE) 10 MG tablet; Take 1.5 tablets (15 mg total) by mouth daily with breakfast.  Dispense: 30 tablet; Refill: 0  2. Fever, unspecified fever cause  - POCT Urinalysis Dipstick - normal  - POCT Rapid Influenza A&B - negative  3. Rhinorrhea  - POCT Rapid Influenza A&B  4. Immunosuppression ([place])  With a fever, discussed CXR ( but she wants to hold off ), monitor symptoms, it may be viral, however advised to call back for a prescription of Levaquin if change in sputum color, worsening of SOB or cough.  I also advised her to send me a copy of CBC ordered by Dr. Lake Bells ( she is getting it done today ) and go to [facility] if she gets worse quickly

## 2015-08-03 ENCOUNTER — Other Ambulatory Visit: Payer: Self-pay

## 2015-08-03 DIAGNOSIS — J8410 Pulmonary fibrosis, unspecified: Secondary | ICD-10-CM

## 2015-08-03 LAB — LIPID PANEL
CHOL/HDL RATIO: 2.9 ratio (ref 0.0–4.4)
Cholesterol, Total: 196 mg/dL (ref 100–199)
HDL: 68 mg/dL (ref >39)
LDL Calculated: 112 mg/dL — ABNORMAL HIGH (ref 0–99)
Triglycerides: 78 mg/dL (ref 0–149)
VLDL Cholesterol Cal: 16 mg/dL (ref 5–40)

## 2015-08-03 MED ORDER — PREDNISONE 10 MG PO TABS: 15.0000 mg | ORAL_TABLET | Freq: Every day | ORAL | Status: DC

## 2015-08-06 ENCOUNTER — Ambulatory Visit: Payer: 59 | Admitting: Family Medicine

## 2015-08-07 ENCOUNTER — Telehealth: Payer: Self-pay | Admitting: Pulmonary Disease

## 2015-08-07 ENCOUNTER — Ambulatory Visit: Payer: 59 | Admitting: Family Medicine

## 2015-08-07 NOTE — Telephone Encounter (Signed)
Spoke with pt.  She is going to cancel PFT scheduled at [facility] on 08/28/15 because she has transplant eval on 08/29/15 and they will do PFT and 6MW at that visit and her insurance wont pay for test twice.  I instructed pt to bring copy of those test results when she comes to ov on 09/07/15. Also pt had wanted to know what we found out from [place] about getting a portable tank to use while pt exercises because she cant use her POC for this.  I spoke with Estill Bamberg from [place] and they can only provide a back up tank for pt but not a portable one because insurance will not pay for 2 portable systems.  I informed pt of this and she verbalized understanding.

## 2015-08-15 ENCOUNTER — Telehealth: Payer: Self-pay | Admitting: Pulmonary Disease

## 2015-08-15 ENCOUNTER — Other Ambulatory Visit: Payer: Self-pay | Admitting: Pulmonary Disease

## 2015-08-15 MED ORDER — ALBUTEROL SULFATE HFA 108 (90 BASE) MCG/ACT IN AERS: 2.0000 | INHALATION_SPRAY | RESPIRATORY_TRACT | Status: DC | PRN

## 2015-08-15 NOTE — Telephone Encounter (Signed)
Called and spoke to pt. Pt is requesting a refill of albuterol hfa. Rx sent to preferred pharmacy. Pt verbalized understanding and denied any further questions or concerns at this time.   

## 2015-08-16 ENCOUNTER — Other Ambulatory Visit
Admission: RE | Admit: 2015-08-16 | Discharge: 2015-08-16 | Disposition: A | Discharge status: Home / Self Care | Payer: 59 | Source: Ambulatory Visit | Attending: Pulmonary Disease | Admitting: Pulmonary Disease

## 2015-08-16 DIAGNOSIS — R5382 Chronic fatigue, unspecified: Secondary | ICD-10-CM | POA: Diagnosis not present

## 2015-08-16 DIAGNOSIS — Z5181 Encounter for therapeutic drug level monitoring: Secondary | ICD-10-CM | POA: Diagnosis present

## 2015-08-16 LAB — CBC WITH DIFFERENTIAL/PLATELET
Basophils Absolute: 0.1 10*3/uL (ref 0–0.1)
Basophils Relative: 1 %
EOS ABS: 0 10*3/uL (ref 0–0.7)
Eosinophils Relative: 0 %
HEMATOCRIT: 40.9 % (ref 35.0–47.0)
HEMOGLOBIN: 13.5 g/dL (ref 12.0–16.0)
LYMPHS ABS: 1 10*3/uL (ref 1.0–3.6)
LYMPHS PCT: 6 %
MCH: 29.4 pg (ref 26.0–34.0)
MCHC: 33 g/dL (ref 32.0–36.0)
MCV: 89.2 fL (ref 80.0–100.0)
MONOS PCT: 4 %
Monocytes Absolute: 0.7 10*3/uL (ref 0.2–0.9)
Neutro Abs: 16 10*3/uL — ABNORMAL HIGH (ref 1.4–6.5)
Neutrophils Relative %: 89 %
Platelets: 283 10*3/uL (ref 150–440)
RBC: 4.58 MIL/uL (ref 3.80–5.20)
RDW: 14.8 % — ABNORMAL HIGH (ref 11.5–14.5)
WBC: 17.8 10*3/uL — AB (ref 3.6–11.0)

## 2015-08-16 LAB — COMPREHENSIVE METABOLIC PANEL
ALBUMIN: 3.9 g/dL (ref 3.5–5.0)
ALT: 28 U/L (ref 14–54)
AST: 24 U/L (ref 15–41)
Alkaline Phosphatase: 61 U/L (ref 38–126)
Anion gap: 9 (ref 5–15)
BUN: 21 mg/dL — AB (ref 6–20)
CHLORIDE: 104 mmol/L (ref 101–111)
CO2: 27 mmol/L (ref 22–32)
Calcium: 9.9 mg/dL (ref 8.9–10.3)
Creatinine, Ser: 1.15 mg/dL — ABNORMAL HIGH (ref 0.44–1.00)
GFR calc Af Amer: 58 mL/min — ABNORMAL LOW (ref >60)
GFR calc non Af Amer: 50 mL/min — ABNORMAL LOW (ref >60)
GLUCOSE: 133 mg/dL — AB (ref 65–99)
POTASSIUM: 4.4 mmol/L (ref 3.5–5.1)
SODIUM: 140 mmol/L (ref 135–145)
Total Bilirubin: 0.4 mg/dL (ref 0.3–1.2)
Total Protein: 7.8 g/dL (ref 6.5–8.1)

## 2015-08-23 ENCOUNTER — Other Ambulatory Visit: Payer: Self-pay | Admitting: Family Medicine

## 2015-08-24 NOTE — Telephone Encounter (Signed)
Patient requesting refill. 

## 2015-08-27 ENCOUNTER — Other Ambulatory Visit: Payer: Self-pay | Admitting: Family Medicine

## 2015-08-28 NOTE — Telephone Encounter (Signed)
Patient requesting refill. 

## 2015-08-29 DIAGNOSIS — Z7682 Awaiting organ transplant status: Secondary | ICD-10-CM | POA: Insufficient documentation

## 2015-09-05 ENCOUNTER — Other Ambulatory Visit
Admission: RE | Admit: 2015-09-05 | Discharge: 2015-09-05 | Disposition: A | Discharge status: Home / Self Care | Payer: 59 | Source: Ambulatory Visit | Attending: Pulmonary Disease | Admitting: Pulmonary Disease

## 2015-09-05 DIAGNOSIS — Z5181 Encounter for therapeutic drug level monitoring: Secondary | ICD-10-CM | POA: Diagnosis not present

## 2015-09-05 LAB — COMPREHENSIVE METABOLIC PANEL
ALBUMIN: 4.1 g/dL (ref 3.5–5.0)
ALK PHOS: 65 U/L (ref 38–126)
ALT: 32 U/L (ref 14–54)
ANION GAP: 7 (ref 5–15)
AST: 32 U/L (ref 15–41)
BILIRUBIN TOTAL: 0.6 mg/dL (ref 0.3–1.2)
BUN: 22 mg/dL — ABNORMAL HIGH (ref 6–20)
CALCIUM: 9.4 mg/dL (ref 8.9–10.3)
CO2: 24 mmol/L (ref 22–32)
Chloride: 104 mmol/L (ref 101–111)
Creatinine, Ser: 1.06 mg/dL — ABNORMAL HIGH (ref 0.44–1.00)
GFR calc non Af Amer: 55 mL/min — ABNORMAL LOW (ref >60)
GLUCOSE: 134 mg/dL — AB (ref 65–99)
Potassium: 4.7 mmol/L (ref 3.5–5.1)
Sodium: 135 mmol/L (ref 135–145)
TOTAL PROTEIN: 7.8 g/dL (ref 6.5–8.1)

## 2015-09-05 LAB — CBC WITH DIFFERENTIAL/PLATELET
Basophils Absolute: 0.1 10*3/uL (ref 0–0.1)
Basophils Relative: 1 %
Eosinophils Absolute: 0 10*3/uL (ref 0–0.7)
Eosinophils Relative: 0 %
HEMATOCRIT: 40.9 % (ref 35.0–47.0)
HEMOGLOBIN: 13.5 g/dL (ref 12.0–16.0)
LYMPHS ABS: 0.8 10*3/uL — AB (ref 1.0–3.6)
LYMPHS PCT: 4 %
MCH: 29.4 pg (ref 26.0–34.0)
MCHC: 33 g/dL (ref 32.0–36.0)
MCV: 89.1 fL (ref 80.0–100.0)
MONOS PCT: 2 %
Monocytes Absolute: 0.4 10*3/uL (ref 0.2–0.9)
NEUTROS ABS: 16.6 10*3/uL — AB (ref 1.4–6.5)
NEUTROS PCT: 93 %
Platelets: 247 10*3/uL (ref 150–440)
RBC: 4.59 MIL/uL (ref 3.80–5.20)
RDW: 15.2 % — ABNORMAL HIGH (ref 11.5–14.5)
WBC: 17.9 10*3/uL — ABNORMAL HIGH (ref 3.6–11.0)

## 2015-09-07 ENCOUNTER — Other Ambulatory Visit (INDEPENDENT_AMBULATORY_CARE_PROVIDER_SITE_OTHER): Payer: 59

## 2015-09-07 ENCOUNTER — Ambulatory Visit (INDEPENDENT_AMBULATORY_CARE_PROVIDER_SITE_OTHER): Payer: 59 | Admitting: Pulmonary Disease

## 2015-09-07 ENCOUNTER — Encounter: Payer: Self-pay | Admitting: Pulmonary Disease

## 2015-09-07 VITALS — BP 124/74 | HR 108 | Ht 62.0 in | Wt 151.0 lb

## 2015-09-07 DIAGNOSIS — Z8619 Personal history of other infectious and parasitic diseases: Secondary | ICD-10-CM | POA: Diagnosis not present

## 2015-09-07 DIAGNOSIS — J8410 Pulmonary fibrosis, unspecified: Secondary | ICD-10-CM

## 2015-09-07 DIAGNOSIS — J9611 Chronic respiratory failure with hypoxia: Secondary | ICD-10-CM

## 2015-09-07 LAB — HEPATIC FUNCTION PANEL
ALBUMIN: 4.2 g/dL (ref 3.5–5.2)
ALK PHOS: 61 U/L (ref 39–117)
ALT: 26 U/L (ref 0–35)
AST: 24 U/L (ref 0–37)
Bilirubin, Direct: 0.1 mg/dL (ref 0.0–0.3)
TOTAL PROTEIN: 7.7 g/dL (ref 6.0–8.3)
Total Bilirubin: 0.3 mg/dL (ref 0.2–1.2)

## 2015-09-07 MED ORDER — AZATHIOPRINE 50 MG PO TABS: 100.0000 mg | ORAL_TABLET | Freq: Every day | ORAL | Status: DC

## 2015-09-07 MED ORDER — PREDNISONE 10 MG PO TABS: 10.0000 mg | ORAL_TABLET | Freq: Every day | ORAL | Status: DC

## 2015-09-07 NOTE — Assessment & Plan Note (Addendum)
She has been progressing based on her worsening oxygenation and her increasing oxygen needs.  Her lung function testing support this as well as she's had a progressive decline in her DLCO. Fortunately, her functional status has remained and she is very motivated to get a lung transplant.  Plan: Continue immunosuppressant medicine for her underlying interstitial lung disease with increasing Imuran to 100 mg daily and decreasing prednisone to 10 mg daily Continue LFT monitoring every 3 months  F/U here in 2 months

## 2015-09-07 NOTE — Patient Instructions (Signed)
We are going to order liver function tests now on every month until you see me back in 2 months Exercise regularly Decrease prednisone to 10 mg Increase Imuran to 100 mg Exercise, exercise, exercise Follow-up 2 months

## 2015-09-07 NOTE — Assessment & Plan Note (Signed)
Fortunately this has resolved with treatment.

## 2015-09-07 NOTE — Progress Notes (Signed)
Subjective:    Patient ID: Erika Martinez, female    DOB: [birth year], 60 y.o.   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  She was found to have connective tissue disease associated UIP and was referred to the [facility] center.  She was found to have Hepatitis C in 2015 and her transplant evaluation has been held further as of 06/2014.  2007 CXR > question ILD 08/2013 CXR > clear progression of interstitial infiltrate and volume loss 08/2013 cXR > pneumonia 10/06/2013 CT chest ([place] read) > compatible with UIP, mildly enlarged right paratracheal lymph node, likely old granulomatous disease, mild cardiomegaly 10/06/2013 pulmonary function test [facility]> flow volume loop compatible with obstruction, ratio 77%, FEV1 0.83 L (39% predicted, -4% change with bronchodilator), unable to perform lung volumes, DLCO 9.3 (54% predicted) 12/09/2014 6MW > 990 feet, O2 saturation 80% on 2L > improved with 3L 01/2014 ANA neg, RF positive 44.7, SCL-70 neg, SSA neg, SSB neg, CCP negative, adolase normal, CRP 0.35 (normal) 02/2014 Started prednisone, referred to [facility] 03/01/2014 6MW 900 feet, dropped to 79% on 3L [place] 03/09/2014 Ratio 77%, FEV1 0.70L (33% pred), FVC 0.91L (32% pred), DLCO 5.4 (31% pred) 08/08/2014 PFT> Ratio 63% post (but 78% pre), FEV1 0.74L (39% pred), FVC 1.18L (48% pred),  08/2015 PFT> Ratio FVC 1.18 L (42% pre), TLC 1.71L (36% pred), DLCO 5.2 (29% pred) 08/2015 ambulatory walking test> walked for 9 minutes, titrated O2 to 15L    HPI Chief Complaint  Patient presents with  . Follow-up    pt went to [facility] for transplant referral last week. Pt c/o "allergy symptoms"- runny nose, sinus congestion, pnd.    Erika Martinez went to [facility] and had her transplant evaluation.  She was told that she had to use 15L O2 on exertion. She had a PFT  and ambulatory oxygen testing at [facility]. She is worried about havng a lung transplant. She had her prednisone bumped back up to 15mg  daily. She feels she could take 10mg  daily. She is currently taking 75 mg imuran now. She has to lose ten pounds, she needs to join pulmonary rehab, and she needs to stop taking Xanax.  Past Medical History  Diagnosis Date  . Asthma   . Mitral valve prolapse   . Seasonal allergies   . Hypertension   . Abnormal hemoglobin (HCC)   . Plantar wart   . Symptomatic menopausal or female climacteric states   . Scleroderma ([place])     Dr. Pennie Banter  . Pulmonary fibrosis ([place])   . Allergy   . Oxygen dependent   . Erythrocytosis   . Hep C w/o coma, chronic ([place])   . Lumbago   . Anxiety   . GERD (gastroesophageal reflux disease)   . IBS (irritable bowel syndrome)   . Controlled insomnia   . Spondylolisthesis      Review of Systems  Constitutional: Negative for fever, chills and fatigue.  HENT: Negative for postnasal drip, rhinorrhea and sinus pressure.   Respiratory: Positive for shortness of breath. Negative for cough and wheezing.   Cardiovascular: Negative for chest pain, palpitations and leg swelling.       Objective:   Physical Exam  Filed Vitals:   09/07/15 1444  BP: 124/74  Pulse: 108  Height: 5\' 2"  (1.575 m)  Weight: 151 lb (68.493 kg)  SpO2: 96%  2L  [place]   Gen: well appearing, no acute distress HEENT: NCAT, EOMi, OP clear, cushingoid faces PULM: Crackles bilateral bases, normal effort CV: RRR, no mgr, no JVD AB: BS+, soft, nontender, no hsm Ext: warm, no edema, + clubbing, no cyanosis Derm: no rash or skin breakdown  Do pulmonary records reviewed were she was evaluated for lung transplant deemed a good candidate but she needed to lose weight, stop Xanax, and participate in pulmonary rehabilitation.  CMP Latest Ref Rng 09/07/2015 09/05/2015 08/16/2015  Glucose 65 - 99 mg/dL - 134(H) 133(H)  BUN 6 - 20 mg/dL - 22(H) 21(H)  Creatinine  0.44 - 1.00 mg/dL - 1.06(H) 1.15(H)  Sodium 135 - 145 mmol/L - 135 140  Potassium 3.5 - 5.1 mmol/L - 4.7 4.4  Chloride 101 - 111 mmol/L - 104 104  CO2 22 - 32 mmol/L - 24 27  Calcium 8.9 - 10.3 mg/dL - 9.4 9.9  Total Protein 6.0 - 8.3 g/dL 7.7 7.8 7.8  Total Bilirubin 0.2 - 1.2 mg/dL 0.3 0.6 0.4  Alkaline Phos 39 - 117 U/L 61 65 61  AST 0 - 37 U/L 24 32 24  ALT 0 - 35 U/L 26 32 28        Assessment & Plan:   Postinflammatory pulmonary fibrosis (HCC) She has been progressing based on her worsening oxygenation and her increasing oxygen needs.  Her lung function testing support this as well as she's had a progressive decline in her DLCO. Fortunately, her functional status has remained and she is very motivated to get a lung transplant.  Plan: Continue immunosuppressant medicine for her underlying interstitial lung disease with increasing Imuran to 100 mg daily and decreasing prednisone to 10 mg daily Continue LFT monitoring every 3 months  F/U here in 2 months   History of hepatitis C Fortunately this has resolved with treatment.  Chronic hypoxemic respiratory failure (HCC) Continue 4-5 L at rest, 15 L with exertion per [facility] recommendations though I will note that we've never seen that she needed oxygen that high.  > 25 minutes spent today in clinic with Erika Martinez, 50% of which was face to face  Updated Medication List Outpatient Encounter Prescriptions as of 09/07/2015  Medication Sig  . ADVAIR DISKUS 500-50 MCG/DOSE AEPB INHALE 1 PUFF INTO THE LUNGS 2 (TWO) TIMES DAILY.  Marland Kitchen albuterol (PROVENTIL HFA;VENTOLIN HFA) 108 (90 Base) MCG/ACT inhaler Inhale 2 puffs into the lungs every 4 (four) hours as needed for wheezing or shortness of breath.  . ALPRAZolam (XANAX) 0.5 MG tablet TAKE 1 TABLET BY MOUTH 3 TIMES A DAY AS NEEDED FOR ANXIETY  . azaTHIOprine (IMURAN) 50 MG tablet Take 2 tablets (100 mg total) by mouth daily. Daily  . cholecalciferol (VITAMIN D) 1000 UNITS tablet Take 2,000  Units by mouth daily.  Marland Kitchen diltiazem (CARDIZEM) 60 MG tablet TAKE 1 TABLET (60 MG TOTAL) BY MOUTH DAILY.  Marland Kitchen DYMISTA 137-50 MCG/ACT SUSP SPRAY ONCE IN EACH NOSTRIL TWICE DAILY  . fluconazole (DIFLUCAN) 150 MG tablet Reported on 08/01/2015  . loratadine (CLARITIN) 10 MG tablet Take 1 tablet (10 mg total) by mouth 2 (two) times daily.  Marland Kitchen nystatin (MYCOSTATIN) 100000 UNIT/ML suspension TAKE 1 TABLESPOON BY MOUTH BEFORE MEALS AND AT BEDTIME (SWISH AND SPIT OUT) (Patient taking differently: TAKE 1 TABLESPOON BY MOUTH BEFORE MEALS AND AT BEDTIME (SWISH AND SPIT OUT) as needed)  . omeprazole (PRILOSEC) 40 MG capsule TAKE 1 CAPSULE TWICE DAILY  . predniSONE (DELTASONE) 10 MG tablet Take 1 tablet (10  mg total) by mouth daily with breakfast.  . sulfamethoxazole-trimethoprim (BACTRIM DS,SEPTRA DS) 800-160 MG tablet Take 1 tablet by mouth 3 (three) times a week.  . VENTOLIN HFA 108 (90 Base) MCG/ACT inhaler INHALE 2 PUFFS INTO THE LUNGS EVERY 4 (FOUR) HOURS AS NEEDED FOR WHEEZING OR SHORTNESS OF BREATH.  . vitamin B-12 (CYANOCOBALAMIN) 250 MCG tablet Place 250 mcg under the tongue every other day.   . [DISCONTINUED] azaTHIOprine (IMURAN) 50 MG tablet Take 1.5 tablets (75 mg total) by mouth daily. Daily  . [DISCONTINUED] predniSONE (DELTASONE) 10 MG tablet Take 1.5 tablets (15 mg total) by mouth daily with breakfast.  . [DISCONTINUED] predniSONE (DELTASONE) 20 MG tablet TAKE 1 TABLET (20 MG TOTAL) BY MOUTH DAILY.   No facility-administered encounter medications on file as of 09/07/2015.

## 2015-09-07 NOTE — Assessment & Plan Note (Signed)
Continue 4-5 L at rest, 15 L with exertion per [facility] recommendations though I will note that we've never seen that she needed oxygen that high.

## 2015-09-17 ENCOUNTER — Telehealth: Payer: Self-pay | Admitting: Pulmonary Disease

## 2015-09-17 ENCOUNTER — Encounter: Payer: Self-pay | Admitting: Internal Medicine

## 2015-09-17 ENCOUNTER — Ambulatory Visit (INDEPENDENT_AMBULATORY_CARE_PROVIDER_SITE_OTHER): Payer: 59 | Admitting: Internal Medicine

## 2015-09-17 VITALS — BP 130/74 | HR 121 | Ht 62.0 in | Wt 150.0 lb

## 2015-09-17 DIAGNOSIS — J0100 Acute maxillary sinusitis, unspecified: Secondary | ICD-10-CM

## 2015-09-17 DIAGNOSIS — J019 Acute sinusitis, unspecified: Secondary | ICD-10-CM | POA: Insufficient documentation

## 2015-09-17 DIAGNOSIS — J8410 Pulmonary fibrosis, unspecified: Secondary | ICD-10-CM

## 2015-09-17 MED ORDER — AZITHROMYCIN 250 MG PO TABS: ORAL_TABLET | ORAL | Status: AC

## 2015-09-17 MED ORDER — ALBUTEROL SULFATE (2.5 MG/3ML) 0.083% IN NEBU: 2.5000 mg | INHALATION_SOLUTION | RESPIRATORY_TRACT | Status: DC | PRN

## 2015-09-17 NOTE — Patient Instructions (Signed)
Pulmonary Fibrosis °Pulmonary fibrosis is a type of lung disease that causes scarring. Over time, the scar tissue (fibrosis) builds up in the air sacs of your lungs. This makes it hard for you to breathe. Less oxygen can get into your blood. °Scarring from pulmonary fibrosis gets worse over time. This damage is permanent. Having damaged lungs may make it more likely that you will have heart problems as well. °CAUSES °Usually, the cause of pulmonary fibrosis is not known (idiopathic pulmonary fibrosis). However, pulmonary fibrosis can be caused by:  °· Exposure to occupational and environmental toxins. These include asbestos, silica, and metal dusts. °· Inhaling moldy hay. This can cause an allergic reaction in the lung (farmer's lung) that can lead to pulmonary fibrosis. °· Inhaling toxic fumes. °· Certain medicines. These include drugs used in radiation therapy or used to treat seizures, heart problems, and some infections. °· Autoimmune diseases, such as rheumatoid arthritis, systemic sclerosis, and connective tissue diseases. °· Sarcoidosis. In this disease, areas of inflammatory cells (granulomas) form and most often affect the lungs. °· Genes. Some cases of pulmonary fibrosis may be passed down through families. °RISK FACTORS °You may be at a higher risk for developing pulmonary fibrosis if: °· You have a family history of the disease. °· You have an autoimmune disease or another condition linked to pulmonary fibrosis. °· You are exposed to certain substances or fumes found in agricultural, farm, construction, or factory work. °· You take certain medicines. °SIGNS AND SYMPTOMS °Symptoms may include: °· Difficulty breathing that gets worse with activity. °· Dry, hacking cough. °· Rapid, shallow breathing during exercise or while at rest. °· Shortness of breath that gets worse (dyspnea). °· Bluish skin and lips. °· Loss of appetite. °· Loss of strength. °· Weight loss and fatigue. °· Rounded and enlarged  fingertips (clubbing). °DIAGNOSIS °Your health care provider may suspect pulmonary fibrosis based on your symptoms and medical history. Diagnosis may include a physical exam. Your health care provider will check for signs that strongly suggest that you have pulmonary fibrosis, such as: °· Blue skin around your fingernails or mouth from reduced oxygen. °· Clubbing around the ends of your fingers. °· A crackling sound when you breathe. °Your health care provider may also do tests to confirm the diagnosis. These may include: °· Looking inside your lungs with an instrument (bronchoscopy). °· Imaging studies of your lungs and heart using: °¨ X-rays. °¨ CT scan. °¨ Sound waves (echocardiogram). °· Tests to measure how well you are breathing (pulmonary function tests). °· Exercise testing to see how well your lungs work while you are walking. °· Blood tests. °· A procedure to remove a small piece of lung tissue to examine in a lab (biopsy). °TREATMENT °There is no cure for pulmonary fibrosis. Treatments focus on managing symptoms and preventing scarring from getting worse. This can include: °· Medicines. °¨ You may take steroids to prevent permanent lung changes. Your health care provider may put you on a high dose at first, then on lower dosages for the long term. °¨ Medicines to suppress your body's defense (immune) system. These can have serious side effects. °· You may be monitored with X-rays and laboratory work. °· Oxygen therapy may be helpful if oxygen in your blood is low. °· Surgery. In some cases, a lung transplant is an option. °HOME CARE INSTRUCTIONS °· Take medicines only as directed by your health care provider. °· Keep your vaccinations up to date as recommended by your health care provider.  °·   Do not use any tobacco products, including cigarettes, chewing tobacco, or electronic cigarettes. If you need help quitting, ask your health care provider. °· Get regular exercise, but do not overexert yourself. Ask  your health care provider to suggest some activities that are safe for you to do. Walking and chair exercises can help if you have physical limitations. °· Consider joining a pulmonary rehabilitation program or a support group for people with pulmonary fibrosis. °· Eat small meals often so you do not get too full. Overeating can make breathing trouble worse. °· Maintain a healthy weight. Lose weight if you need to. °· Do breathing exercises as directed by your health care provider. °· Keep all follow-up visits as directed by your health care provider. This is important. °SEEK MEDICAL CARE IF: °· You are not able to be as active as usual. °· You have a long-lasting (chronic) cough. °· You are often short of breath. °· You have a fever or chills. °SEEK IMMEDIATE MEDICAL CARE IF: °· You have chest pain. °· Your breathing is much worse. °· You cannot take a deep breath. °· You have blue skin around your mouth or fingers. °· You have clubbing of your fingers. °· You cough up mucus that is dark in color. °· You have a lot of headaches. °· You get very confused or sleepy. °  °This information is not intended to replace advice given to you by your health care provider. Make sure you discuss any questions you have with your health care provider. °  °Document Released: 08/16/2003 Document Revised: 06/16/2014 Document Reviewed: 11/03/2013 °Elsevier Interactive Patient Education ©2016 [employer] ° °

## 2015-09-17 NOTE — Progress Notes (Signed)
Subjective:    Patient ID: Erika Martinez, female    DOB: 07/17/1955, 60 y.o.   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  She was found to have connective tissue disease associated UIP and was [facility] to [facility].  She was found to have Hepatitis C in 2015 and her transplant evaluation has been held further as of 06/2014.  2007 CXR > question ILD 08/2013 CXR > clear progression of interstitial infiltrate and volume loss 08/2013 cXR > pneumonia 10/06/2013 CT chest ([place] read) > compatible with UIP, mildly enlarged right paratracheal lymph node, likely old granulomatous disease, mild cardiomegaly 10/06/2013 pulmonary function test [facility]> flow volume loop compatible with obstruction, ratio 77%, FEV1 0.83 L (39% predicted, -4% change with bronchodilator), unable to perform lung volumes, DLCO 9.3 (54% predicted) 12/09/2014 6MW > 990 feet, O2 saturation 80% on 2L > improved with 3L 01/2014 ANA neg, RF positive 44.7, SCL-70 neg, SSA neg, SSB neg, CCP negative, adolase normal, CRP 0.35 (normal) 02/2014 Started prednisone, [facility] 03/01/2014 6MW 900 feet, dropped to 79% on 3L Erika Martinez 03/09/2014 Ratio 77%, FEV1 0.70L (33% pred), FVC 0.91L (32% pred), DLCO Martinez.4 (31% pred) 08/08/2014 PFT> Ratio 63% post (but 78% pre), FEV1 0.74L (39% pred), FVC 1.18L (48% pred),  08/2015 PFT> Ratio FVC 1.18 L (42% pre), TLC 1.71L (36% pred), DLCO Martinez.2 (29% pred) 08/2015 ambulatory walking test> walked for 9 minutes, titrated O2 to 15L Erika Martinez 4/10 acute visit-sinus infection/acute bronchitis   HPI Chief Complaint  Patient presents with  . Acute Visit    HPI:  acute sinus drainage noted for last 3 days, no fevers at this time Has cough with come phlegm      Review of Systems  Constitutional: Negative for fever, chills and fatigue.   HENT: Positive for congestion, postnasal drip, rhinorrhea and sinus pressure.   Respiratory: Positive for shortness of breath. Negative for cough and wheezing.   Cardiovascular: Negative for chest pain, palpitations and leg swelling.       Objective:   Physical Exam  BP 130/74 mmHg  Pulse 121  Ht Martinez\' 2"  (1.575 m)  Wt 150 lb (68.04 kg)  BMI 27.43 kg/m2  SpO2 97%   Gen: well appearing, no acute distress HEENT: NCAT, EOMi, OP clear, cushingoid faces PULM: Crackles bilateral bases, normal effort CV: RRR, no mgr, no JVD AB: BS+, soft, nontender, no hsm Ext: warm, no edema, + clubbing, no cyanosis Derm: no rash or skin breakdown       Assessment & Plan:  60 yo AAF seen today for acute visit for acute bronchitis and acute sinusitis  1.acute Sinus infection -start z pak, if symptoms persist, then can prescribe Augmentin -albuterol NEB every 4 hrs as needed fro cough and intermittent wheezing   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   History of hepatitis C Fortunately this has resolved with treatment.  Chronic hypoxemic respiratory failure (HCC) Continue 4-Martinez L at rest  The Patient requires high complexity decision making for assessment and support, frequent evaluation and titration of therapies. Patient  are satisfied with Plan of action and management. All questions answered  Corrin Parker, M.D.  [facility]  Medical Director [facility] Director [facility] Cardio-Pulmonary Department

## 2015-09-17 NOTE — Telephone Encounter (Signed)
Spoke with pt. Reports increased coughing for the past few days. She would like to have someone listen to her lungs before the holiday. She has been placed on the [facility] schedule to see Dr. Mortimer Fries at 11am. Nothing further was needed.

## 2015-09-18 ENCOUNTER — Ambulatory Visit: Admission: RE | Admit: 2015-09-18 | Payer: 59 | Source: Ambulatory Visit | Admitting: Gastroenterology

## 2015-09-18 ENCOUNTER — Encounter: Admission: RE | Payer: Self-pay | Source: Ambulatory Visit

## 2015-09-18 SURGERY — COLONOSCOPY WITH PROPOFOL
Anesthesia: General

## 2015-10-02 ENCOUNTER — Other Ambulatory Visit
Admission: RE | Admit: 2015-10-02 | Discharge: 2015-10-02 | Disposition: A | Discharge status: Home / Self Care | Payer: 59 | Source: Ambulatory Visit | Attending: Pulmonary Disease | Admitting: Pulmonary Disease

## 2015-10-02 DIAGNOSIS — Z5181 Encounter for therapeutic drug level monitoring: Secondary | ICD-10-CM | POA: Insufficient documentation

## 2015-10-02 DIAGNOSIS — R5382 Chronic fatigue, unspecified: Secondary | ICD-10-CM | POA: Diagnosis not present

## 2015-10-02 DIAGNOSIS — J8410 Pulmonary fibrosis, unspecified: Secondary | ICD-10-CM | POA: Insufficient documentation

## 2015-10-02 LAB — CBC WITH DIFFERENTIAL/PLATELET
Basophils Absolute: 0 10*3/uL (ref 0–0.1)
Basophils Relative: 0 %
EOS PCT: 0 %
Eosinophils Absolute: 0 10*3/uL (ref 0–0.7)
HEMATOCRIT: 40.2 % (ref 35.0–47.0)
HEMOGLOBIN: 12.9 g/dL (ref 12.0–16.0)
LYMPHS PCT: 7 %
Lymphs Abs: 1.1 10*3/uL (ref 1.0–3.6)
MCH: 28.7 pg (ref 26.0–34.0)
MCHC: 32.1 g/dL (ref 32.0–36.0)
MCV: 89.3 fL (ref 80.0–100.0)
MONO ABS: 0.7 10*3/uL (ref 0.2–0.9)
Monocytes Relative: 4 %
Neutro Abs: 15 10*3/uL — ABNORMAL HIGH (ref 1.4–6.5)
Neutrophils Relative %: 89 %
Platelets: 261 10*3/uL (ref 150–440)
RBC: 4.5 MIL/uL (ref 3.80–5.20)
RDW: 14.6 % — ABNORMAL HIGH (ref 11.5–14.5)
WBC: 16.8 10*3/uL — AB (ref 3.6–11.0)

## 2015-10-02 LAB — HEPATIC FUNCTION PANEL
ALBUMIN: 4.2 g/dL (ref 3.5–5.0)
ALK PHOS: 52 U/L (ref 38–126)
ALT: 27 U/L (ref 14–54)
AST: 26 U/L (ref 15–41)
BILIRUBIN TOTAL: 0.4 mg/dL (ref 0.3–1.2)
Bilirubin, Direct: <0.1 mg/dL — ABNORMAL LOW (ref 0.1–0.5)
Total Protein: 7.8 g/dL (ref 6.5–8.1)

## 2015-10-03 ENCOUNTER — Encounter: Payer: Self-pay | Admitting: Family Medicine

## 2015-10-03 ENCOUNTER — Ambulatory Visit (INDEPENDENT_AMBULATORY_CARE_PROVIDER_SITE_OTHER): Payer: 59 | Admitting: Family Medicine

## 2015-10-03 VITALS — BP 120/68 | HR 104 | Temp 98.1°F | Resp 16 | Ht 62.0 in | Wt 151.9 lb

## 2015-10-03 DIAGNOSIS — Z7682 Awaiting organ transplant status: Secondary | ICD-10-CM | POA: Diagnosis not present

## 2015-10-03 DIAGNOSIS — J3089 Other allergic rhinitis: Principal | ICD-10-CM

## 2015-10-03 DIAGNOSIS — J309 Allergic rhinitis, unspecified: Secondary | ICD-10-CM | POA: Diagnosis not present

## 2015-10-03 DIAGNOSIS — J302 Other seasonal allergic rhinitis: Secondary | ICD-10-CM

## 2015-10-03 DIAGNOSIS — E663 Overweight: Secondary | ICD-10-CM

## 2015-10-03 DIAGNOSIS — J8410 Pulmonary fibrosis, unspecified: Secondary | ICD-10-CM | POA: Diagnosis not present

## 2015-10-03 NOTE — Progress Notes (Signed)
Name: Erika Martinez   MRN: [id number]    DOB: Oct 01, 1955   Date:10/03/2015       Progress Note  Subjective  Chief Complaint  Chief Complaint  Patient presents with  . Advice Only    regarding her lung transplant  . Allergic Rhinitis     lots of runny nose and laryngitis    HPI  Pulmonary fibrosis: she was diagnosed in 09/2013 secondary to scleroderma, she see pulmonologist - Dr. Lake Bells, and is on the window to get a lung transplant - waiting for further evaluation at [facility].  She was  denied last year because she was HCV positive, she has been treated with Havoni and in June titer was negative, and repeat titer negative 05/2015. She does not have liver cirrhosis. She is on oxygen 2-3 liters at rest and advised by pulmonologist to 4-6 liters during activity ( at the gym ), she is now on Imuran, and bactrin three times weekly, weaning off prednisone since HCV titers are back to normal . She is getting nervous about getting lung transplant and about the testing that she is about to start.   Major Depression: she feels tired, no motivation, upset about the weight gain secondary to prednisone, tired of having to drag oxygen tank around. She is getting more anxious because of possibility of lung transplant. Taking Alprazolam prn only for anxiety.   AR: she is taking prednisone but still has rhinorrhea, nasal congestion, recently given a Z-pack by Dr Mortimer Fries for recent sinusitis. She is feeling better now. She is taking Loratadine, but has been out of Dymista because of cost, she has some flonase at home and advised to resume that as a nasal spray. Discussed singulair, but she said it never worked for her.   Patient Active Problem List   Diagnosis Date Noted  . Awaiting organ transplant status 08/29/2015  . Decreased GFR 07/24/2015  . Mild major depression ([place]) 06/07/2015  . Pulmonary fibrosis ([place]) 04/06/2015  . Hypertension, benign 04/06/2015  . History of hepatitis C 02/02/2015  . Insomnia  02/02/2015  . Gastro-esophageal reflux disease without esophagitis 02/02/2015  . IBS (irritable bowel syndrome) 02/02/2015  . Low serum cobalamin 02/02/2015  . Dependence on supplemental oxygen 02/02/2015  . Perennial allergic rhinitis with seasonal variation 02/02/2015  . Scleroderma ([place]) 02/02/2015  . SPL (spondylolisthesis) 02/02/2015  . Supraventricular tachycardia ([place]) 02/02/2015  . Vitamin D deficiency 02/02/2015  . Anxiety 02/02/2015  . History of pneumococcal pneumonia 09/13/2013  . Chronic hypoxemic respiratory failure ([place]) 09/13/2013  . Polycythemia, secondary 09/13/2013  . Postinflammatory pulmonary fibrosis ([place]) 09/13/2013    Past Surgical History  Procedure Laterality Date  . Vaginal hysterectomy    . Abdominal hysterectomy      Family History  Problem Relation Age of Onset  . Lung cancer Father   . Cancer Father   . Alzheimer's disease Mother     Social History   Social History  . Marital Status: Divorced    Spouse Name: N/A  . Number of Children: N/A  . Years of Education: N/A   Occupational History  . Not on file.   Social History Main Topics  . Smoking status: Never Smoker   . Smokeless tobacco: Never Used  . Alcohol Use: 2.0 oz/week    4 Standard drinks or equivalent per week     Comment: "social drinker"  . Drug Use: No  . Sexual Activity: Not on file   Other Topics Concern  . Not on  file   Social History Narrative     Current outpatient prescriptions:  .  ADVAIR DISKUS 500-50 MCG/DOSE AEPB, INHALE 1 PUFF INTO THE LUNGS 2 (TWO) TIMES DAILY., Disp: 60 each, Rfl: 5 .  albuterol (PROVENTIL HFA;VENTOLIN HFA) 108 (90 Base) MCG/ACT inhaler, Inhale 2 puffs into the lungs every 4 (four) hours as needed for wheezing or shortness of breath., Disp: 8 g, Rfl: 5 .  albuterol (PROVENTIL) (2.5 MG/3ML) 0.083% nebulizer solution, Take 3 mLs (2.5 mg total) by nebulization every 4 (four) hours as needed for wheezing or shortness of breath. DX Code:  J84.10, Disp: 75 mL, Rfl: 12 .  ALPRAZolam (XANAX) 0.5 MG tablet, TAKE 1 TABLET BY MOUTH 3 TIMES A DAY AS NEEDED FOR ANXIETY, Disp: 45 tablet, Rfl: 2 .  azaTHIOprine (IMURAN) 50 MG tablet, Take 2 tablets (100 mg total) by mouth daily. Daily, Disp: 60 tablet, Rfl: 2 .  cholecalciferol (VITAMIN D) 1000 UNITS tablet, Take 2,000 Units by mouth daily., Disp: , Rfl:  .  diltiazem (CARDIZEM) 60 MG tablet, TAKE 1 TABLET (60 MG TOTAL) BY MOUTH DAILY., Disp: 30 tablet, Rfl: 2 .  DYMISTA 137-50 MCG/ACT SUSP, SPRAY ONCE IN EACH NOSTRIL TWICE DAILY, Disp: , Rfl: 12 .  fluconazole (DIFLUCAN) 150 MG tablet, Take 150 mg by mouth daily., Disp: , Rfl:  .  loratadine (CLARITIN) 10 MG tablet, Take 1 tablet (10 mg total) by mouth 2 (two) times daily., Disp: 60 tablet, Rfl: 3 .  nystatin (MYCOSTATIN) 100000 UNIT/ML suspension, TAKE 1 TABLESPOON BY MOUTH BEFORE MEALS AND AT BEDTIME (SWISH AND SPIT OUT) (Patient taking differently: TAKE 1 TABLESPOON BY MOUTH BEFORE MEALS AND AT BEDTIME (SWISH AND SPIT OUT) as needed), Disp: 473 mL, Rfl: 2 .  omeprazole (PRILOSEC) 40 MG capsule, TAKE 1 CAPSULE TWICE DAILY, Disp: 60 capsule, Rfl: 2 .  predniSONE (DELTASONE) 10 MG tablet, Take 1 tablet (10 mg total) by mouth daily with breakfast., Disp: 30 tablet, Rfl: 2 .  sulfamethoxazole-trimethoprim (BACTRIM DS,SEPTRA DS) 800-160 MG tablet, TAKE 1 TABLET BY MOUTH 3 (THREE) TIMES A WEEK., Disp: , Rfl: 5 .  VENTOLIN HFA 108 (90 Base) MCG/ACT inhaler, INHALE 2 PUFFS INTO THE LUNGS EVERY 4 (FOUR) HOURS AS NEEDED FOR WHEEZING OR SHORTNESS OF BREATH., Disp: 18 g, Rfl: 2 .  vitamin B-12 (CYANOCOBALAMIN) 250 MCG tablet, Place 250 mcg under the tongue every other day. , Disp: , Rfl:   No Known Allergies   ROS  Constitutional: Negative for fever or weight change.  Respiratory: Positive for cough and shortness of breath.   Cardiovascular: Negative for chest pain or palpitations.  Gastrointestinal: Negative for abdominal pain, no bowel changes.   Musculoskeletal: Negative for gait problem or joint swelling.  Skin: Negative for rash.  Neurological: Negative for dizziness or headache.  No other specific complaints in a complete review of systems (except as listed in HPI above).  Objective  Filed Vitals:   10/03/15 1421  BP: 120/68  Pulse: 104  Temp: 98.1 F (36.7 C)  TempSrc: Oral  Resp: 16  Height: '5\' 2"'  (1.575 m)  Weight: 151 lb 14.4 oz (68.901 kg)  SpO2: 99%    Body mass index is 27.78 kg/(m^2).  Physical Exam  Constitutional: Patient appears well-developed and well-nourished. Overweight No distress.  HEENT: head atraumatic, normocephalic, pupils equal and reactive to light, boggy turbinates,neck supple, throat within normal limits Cardiovascular: Normal rate, regular rhythm and normal heart sounds.  No murmur heard. trace BLE edema. Pulmonary/Chest: Effort normal and breath  sounds normal. No respiratory distress. Abdominal: Soft.  There is no tenderness. Psychiatric: Patient has a normal mood and affect. behavior is normal. Judgment and thought content normal.  Recent Results (from the past 2160 hour(s))  Hemoglobin A1c     Status: Abnormal   Collection Time: 07/05/15  3:14 PM  Result Value Ref Range   Hgb A1c MFr Bld 6.3 (H) 4.8 - 5.6 %    Comment:          Pre-diabetes: 5.7 - 6.4          Diabetes: >6.4          Glycemic control for adults with diabetes: <7.0    Est. average glucose Bld gHb Est-mCnc 134 mg/dL  Pulmonary function test     Status: None (Preliminary result)   Collection Time: 07/10/15  2:57 PM  Result Value Ref Range   FVC-Pre 1.22 L   FVC-%Pred-Pre 51 %   FVC-Post 1.17 L   FVC-%Pred-Post 48 %   FVC-%Change-Post -4 %   FEV1-Pre 0.94 L   FEV1-%Pred-Pre 50 %   FEV1-Post 0.81 L   FEV1-%Pred-Post 43 %   FEV1-%Change-Post -13 %   FEV6-Pre 1.12 L   FEV6-%Pred-Pre 48 %   FEV6-Post 1.16 L   FEV6-%Pred-Post 50 %   FEV6-%Change-Post 3 %   Pre FEV1/FVC ratio 77 %   FEV1FVC-%Pred-Pre 97 %    Post FEV1/FVC ratio 70 %   FEV1FVC-%Change-Post -9 %   Pre FEV6/FVC Ratio 100 %   FEV6FVC-%Pred-Pre 104 %   Post FEV6/FVC ratio 100 %   FEV6FVC-%Pred-Post 104 %   FEF 25-75 Pre 1.03 L/sec   FEF2575-%Pred-Pre 55 %   FEF 25-75 Post 0.48 L/sec   FEF2575-%Pred-Post 25 %   FEF2575-%Change-Post -53 %   DLCO unc 0.02 ml/min/mmHg   DLCO unc % pred 0 %   DL/VA 0.03 ml/min/mmHg/L   DL/VA % pred 0 %  Comp Met (CMET)     Status: Abnormal   Collection Time: 07/12/15  4:53 PM  Result Value Ref Range   Sodium 137 135 - 145 mmol/L   Potassium 4.5 3.5 - 5.1 mmol/L   Chloride 104 101 - 111 mmol/L   CO2 26 22 - 32 mmol/L   Glucose, Bld 112 (H) 65 - 99 mg/dL   BUN 26 (H) 6 - 20 mg/dL   Creatinine, Ser 1.24 (H) 0.44 - 1.00 mg/dL   Calcium 9.4 8.9 - 10.3 mg/dL   Total Protein 7.4 6.5 - 8.1 g/dL   Albumin 4.0 3.5 - 5.0 g/dL   AST 33 15 - 41 U/L   ALT 40 14 - 54 U/L   Alkaline Phosphatase 67 38 - 126 U/L   Total Bilirubin 0.2 (L) 0.3 - 1.2 mg/dL   GFR calc non Af Amer 46 (L) >60 mL/min   GFR calc Af Amer 53 (L) >60 mL/min    Comment: (NOTE) The eGFR has been calculated using the CKD EPI equation. This calculation has not been validated in all clinical situations. eGFR's persistently <60 mL/min signify possible Chronic Kidney Disease.    Anion gap 7 5 - 15  CBC w/Diff     Status: Abnormal   Collection Time: 07/12/15  4:53 PM  Result Value Ref Range   WBC 18.1 (H) 3.6 - 11.0 K/uL   RBC 4.81 3.80 - 5.20 MIL/uL   Hemoglobin 14.3 12.0 - 16.0 g/dL   HCT 42.7 35.0 - 47.0 %   MCV 88.9 80.0 - 100.0 fL  MCH 29.7 26.0 - 34.0 pg   MCHC 33.4 32.0 - 36.0 g/dL   RDW 14.2 11.5 - 14.5 %   Platelets 206 150 - 440 K/uL   Neutrophils Relative % 91 %   Neutro Abs 16.6 (H) 1.4 - 6.5 K/uL   Lymphocytes Relative 5 %   Lymphs Abs 0.8 (L) 1.0 - 3.6 K/uL   Monocytes Relative 4 %   Monocytes Absolute 0.7 0.2 - 0.9 K/uL   Eosinophils Relative 0 %   Eosinophils Absolute 0.0 0 - 0.7 K/uL   Basophils Relative  0 %   Basophils Absolute 0.0 0 - 0.1 K/uL  Basic metabolic panel     Status: Abnormal   Collection Time: 07/18/15  3:34 PM  Result Value Ref Range   Sodium 138 135 - 145 mmol/L   Potassium 4.6 3.5 - 5.1 mmol/L   Chloride 101 101 - 111 mmol/L   CO2 28 22 - 32 mmol/L   Glucose, Bld 161 (H) 65 - 99 mg/dL   BUN 24 (H) 6 - 20 mg/dL   Creatinine, Ser 1.18 (H) 0.44 - 1.00 mg/dL   Calcium 9.7 8.9 - 10.3 mg/dL   GFR calc non Af Amer 49 (L) >60 mL/min   GFR calc Af Amer 56 (L) >60 mL/min    Comment: (NOTE) The eGFR has been calculated using the CKD EPI equation. This calculation has not been validated in all clinical situations. eGFR's persistently <60 mL/min signify possible Chronic Kidney Disease.    Anion gap 9 5 - 15  POCT Urinalysis Dipstick     Status: Normal   Collection Time: 08/01/15 11:40 AM  Result Value Ref Range   Color, UA light yellow    Clarity, UA clear    Glucose, UA neg    Bilirubin, UA neg    Ketones, UA neg    Spec Grav, UA 1.010    Blood, UA neg    pH, UA 5.0    Protein, UA neg    Urobilinogen, UA 0.2    Nitrite, UA neg    Leukocytes, UA Negative Negative  POCT Influenza A/B     Status: Normal   Collection Time: 08/01/15 11:43 AM  Result Value Ref Range   Influenza A, POC Negative Negative   Influenza B, POC Negative Negative  Comp Met (CMET)     Status: Abnormal   Collection Time: 08/01/15 12:42 PM  Result Value Ref Range   Sodium 140 135 - 145 mmol/L   Potassium 4.7 3.5 - 5.1 mmol/L   Chloride 106 101 - 111 mmol/L   CO2 26 22 - 32 mmol/L   Glucose, Bld 175 (H) 65 - 99 mg/dL   BUN 19 6 - 20 mg/dL   Creatinine, Ser 1.13 (H) 0.44 - 1.00 mg/dL   Calcium 9.6 8.9 - 10.3 mg/dL   Total Protein 7.5 6.5 - 8.1 g/dL   Albumin 4.1 3.5 - 5.0 g/dL   AST 34 15 - 41 U/L   ALT 40 14 - 54 U/L   Alkaline Phosphatase 67 38 - 126 U/L   Total Bilirubin 0.2 (L) 0.3 - 1.2 mg/dL   GFR calc non Af Amer 51 (L) >60 mL/min   GFR calc Af Amer 60 (L) >60 mL/min    Comment:  (NOTE) The eGFR has been calculated using the CKD EPI equation. This calculation has not been validated in all clinical situations. eGFR's persistently <60 mL/min signify possible Chronic Kidney Disease.    Anion gap  8 5 - 15  CBC w/Diff     Status: Abnormal   Collection Time: 08/01/15 12:42 PM  Result Value Ref Range   WBC 16.2 (H) 3.6 - 11.0 K/uL   RBC 4.63 3.80 - 5.20 MIL/uL   Hemoglobin 13.9 12.0 - 16.0 g/dL   HCT 42.5 35.0 - 47.0 %   MCV 91.8 80.0 - 100.0 fL   MCH 30.1 26.0 - 34.0 pg   MCHC 32.8 32.0 - 36.0 g/dL   RDW 15.1 (H) 11.5 - 14.5 %   Platelets 194 150 - 440 K/uL   Neutrophils Relative % 94 %   Neutro Abs 15.2 (H) 1.4 - 6.5 K/uL   Lymphocytes Relative 2 %   Lymphs Abs 0.3 (L) 1.0 - 3.6 K/uL   Monocytes Relative 4 %   Monocytes Absolute 0.6 0.2 - 0.9 K/uL   Eosinophils Relative 0 %   Eosinophils Absolute 0.0 0 - 0.7 K/uL   Basophils Relative 0 %   Basophils Absolute 0.0 0 - 0.1 K/uL  Lipid panel     Status: Abnormal   Collection Time: 08/02/15  8:54 AM  Result Value Ref Range   Cholesterol, Total 196 100 - 199 mg/dL   Triglycerides 78 0 - 149 mg/dL   HDL 68 >39 mg/dL   VLDL Cholesterol Cal 16 5 - 40 mg/dL   LDL Calculated 112 (H) 0 - 99 mg/dL   Chol/HDL Ratio 2.9 0.0 - 4.4 ratio units    Comment:                                   T. Chol/HDL Ratio                                             Men  Women                               1/2 Avg.Risk  3.4    3.3                                   Avg.Risk  5.0    4.4                                2X Avg.Risk  9.6    7.1                                3X Avg.Risk 23.4   11.0   CBC with Differential/Platelet     Status: Abnormal   Collection Time: 08/16/15  3:47 PM  Result Value Ref Range   WBC 17.8 (H) 3.6 - 11.0 K/uL   RBC 4.58 3.80 - 5.20 MIL/uL   Hemoglobin 13.5 12.0 - 16.0 g/dL   HCT 40.9 35.0 - 47.0 %   MCV 89.2 80.0 - 100.0 fL   MCH 29.4 26.0 - 34.0 pg   MCHC 33.0 32.0 - 36.0 g/dL   RDW 14.8 (H) 11.5 -  14.5 %   Platelets 283 150 - 440 K/uL   Neutrophils Relative % 89 %  Neutro Abs 16.0 (H) 1.4 - 6.5 K/uL   Lymphocytes Relative 6 %   Lymphs Abs 1.0 1.0 - 3.6 K/uL   Monocytes Relative 4 %   Monocytes Absolute 0.7 0.2 - 0.9 K/uL   Eosinophils Relative 0 %   Eosinophils Absolute 0.0 0 - 0.7 K/uL   Basophils Relative 1 %   Basophils Absolute 0.1 0 - 0.1 K/uL  Comprehensive metabolic panel     Status: Abnormal   Collection Time: 08/16/15  3:47 PM  Result Value Ref Range   Sodium 140 135 - 145 mmol/L   Potassium 4.4 3.5 - 5.1 mmol/L   Chloride 104 101 - 111 mmol/L   CO2 27 22 - 32 mmol/L   Glucose, Bld 133 (H) 65 - 99 mg/dL   BUN 21 (H) 6 - 20 mg/dL   Creatinine, Ser 1.15 (H) 0.44 - 1.00 mg/dL   Calcium 9.9 8.9 - 10.3 mg/dL   Total Protein 7.8 6.5 - 8.1 g/dL   Albumin 3.9 3.5 - 5.0 g/dL   AST 24 15 - 41 U/L   ALT 28 14 - 54 U/L   Alkaline Phosphatase 61 38 - 126 U/L   Total Bilirubin 0.4 0.3 - 1.2 mg/dL   GFR calc non Af Amer 50 (L) >60 mL/min   GFR calc Af Amer 58 (L) >60 mL/min    Comment: (NOTE) The eGFR has been calculated using the CKD EPI equation. This calculation has not been validated in all clinical situations. eGFR's persistently <60 mL/min signify possible Chronic Kidney Disease.    Anion gap 9 5 - 15  CBC w/Diff     Status: Abnormal   Collection Time: 09/05/15  3:22 PM  Result Value Ref Range   WBC 17.9 (H) 3.6 - 11.0 K/uL   RBC 4.59 3.80 - 5.20 MIL/uL   Hemoglobin 13.5 12.0 - 16.0 g/dL   HCT 40.9 35.0 - 47.0 %   MCV 89.1 80.0 - 100.0 fL   MCH 29.4 26.0 - 34.0 pg   MCHC 33.0 32.0 - 36.0 g/dL   RDW 15.2 (H) 11.5 - 14.5 %   Platelets 247 150 - 440 K/uL   Neutrophils Relative % 93 %   Neutro Abs 16.6 (H) 1.4 - 6.5 K/uL   Lymphocytes Relative 4 %   Lymphs Abs 0.8 (L) 1.0 - 3.6 K/uL   Monocytes Relative 2 %   Monocytes Absolute 0.4 0.2 - 0.9 K/uL   Eosinophils Relative 0 %   Eosinophils Absolute 0.0 0 - 0.7 K/uL   Basophils Relative 1 %   Basophils  Absolute 0.1 0 - 0.1 K/uL  Comp Met (CMET)     Status: Abnormal   Collection Time: 09/05/15  3:22 PM  Result Value Ref Range   Sodium 135 135 - 145 mmol/L   Potassium 4.7 3.5 - 5.1 mmol/L   Chloride 104 101 - 111 mmol/L   CO2 24 22 - 32 mmol/L   Glucose, Bld 134 (H) 65 - 99 mg/dL   BUN 22 (H) 6 - 20 mg/dL   Creatinine, Ser 1.06 (H) 0.44 - 1.00 mg/dL   Calcium 9.4 8.9 - 10.3 mg/dL   Total Protein 7.8 6.5 - 8.1 g/dL   Albumin 4.1 3.5 - 5.0 g/dL   AST 32 15 - 41 U/L   ALT 32 14 - 54 U/L   Alkaline Phosphatase 65 38 - 126 U/L   Total Bilirubin 0.6 0.3 - 1.2 mg/dL   GFR calc non Af [place]nia Lora  55 (L) >60 mL/min   GFR calc Af Amer >60 >60 mL/min    Comment: (NOTE) The eGFR has been calculated using the CKD EPI equation. This calculation has not been validated in all clinical situations. eGFR's persistently <60 mL/min signify possible Chronic Kidney Disease.    Anion gap 7 5 - 15  Hepatic function panel     Status: None   Collection Time: 09/07/15  4:01 PM  Result Value Ref Range   Total Bilirubin 0.3 0.2 - 1.2 mg/dL   Bilirubin, Direct 0.1 0.0 - 0.3 mg/dL   Alkaline Phosphatase 61 39 - 117 U/L   AST 24 0 - 37 U/L   ALT 26 0 - 35 U/L   Total Protein 7.7 6.0 - 8.3 g/dL   Albumin 4.2 3.5 - 5.2 g/dL  CBC with Differential/Platelet     Status: Abnormal   Collection Time: 10/02/15  3:35 PM  Result Value Ref Range   WBC 16.8 (H) 3.6 - 11.0 K/uL   RBC 4.50 3.80 - 5.20 MIL/uL   Hemoglobin 12.9 12.0 - 16.0 g/dL   HCT 40.2 35.0 - 47.0 %   MCV 89.3 80.0 - 100.0 fL   MCH 28.7 26.0 - 34.0 pg   MCHC 32.1 32.0 - 36.0 g/dL   RDW 14.6 (H) 11.5 - 14.5 %   Platelets 261 150 - 440 K/uL   Neutrophils Relative % 89 %   Neutro Abs 15.0 (H) 1.4 - 6.5 K/uL   Lymphocytes Relative 7 %   Lymphs Abs 1.1 1.0 - 3.6 K/uL   Monocytes Relative 4 %   Monocytes Absolute 0.7 0.2 - 0.9 K/uL   Eosinophils Relative 0 %   Eosinophils Absolute 0.0 0 - 0.7 K/uL   Basophils Relative 0 %   Basophils Absolute 0.0 0 -  0.1 K/uL  Hepatic function panel     Status: Abnormal   Collection Time: 10/02/15  3:35 PM  Result Value Ref Range   Total Protein 7.8 6.5 - 8.1 g/dL   Albumin 4.2 3.5 - 5.0 g/dL   AST 26 15 - 41 U/L   ALT 27 14 - 54 U/L   Alkaline Phosphatase 52 38 - 126 U/L   Total Bilirubin 0.4 0.3 - 1.2 mg/dL   Bilirubin, Direct <0.1 (L) 0.1 - 0.5 mg/dL   Indirect Bilirubin NOT CALCULATED 0.3 - 0.9 mg/dL     PHQ2/9: Depression screen [facility] 2/9 10/03/2015 07/03/2015 04/06/2015 03/14/2015 02/02/2015  Decreased Interest 0 0 0 0 0  Down, Depressed, Hopeless 0 0 0 0 0  PHQ - 2 Score 0 0 0 0 0     Fall Risk: Fall Risk  10/03/2015 07/03/2015 1Aug 10, 202016 04/06/2015 02/23/2015  Falls in the past year? No No No No No     Functional Status Survey: Is the patient deaf or have difficulty hearing?: No Does the patient have difficulty seeing, even when wearing glasses/contacts?: No Does the patient have difficulty concentrating, remembering, or making decisions?: No Does the patient have difficulty walking or climbing stairs?: Yes (due to lack of oxygenat times) Does the patient have difficulty dressing or bathing?: No Does the patient have difficulty doing errands alone such as visiting a doctor's office or shopping?: No    Assessment & Plan  1. Perennial allergic rhinitis with seasonal variation  Resume nasal steroid  2. Overweight (BMI 25.0-29.9)  She needs to lose 10 lbs before lung transplant, hopefully it will be easier once she weans self off prednisone  3.  Pulmonary fibrosis ([place])  On window for lung transplant, having some anxiety about it, but progressing with testing  4. Awaiting organ transplant status  At [facility]

## 2015-10-10 ENCOUNTER — Ambulatory Visit
Admission: RE | Admit: 2015-10-10 | Discharge: 2015-10-10 | Disposition: A | Discharge status: Home / Self Care | Payer: 59 | Source: Ambulatory Visit | Attending: Family Medicine | Admitting: Family Medicine

## 2015-10-10 DIAGNOSIS — Z1231 Encounter for screening mammogram for malignant neoplasm of breast: Secondary | ICD-10-CM | POA: Insufficient documentation

## 2015-10-10 DIAGNOSIS — Z1239 Encounter for other screening for malignant neoplasm of breast: Secondary | ICD-10-CM

## 2015-10-15 ENCOUNTER — Other Ambulatory Visit: Payer: Self-pay | Admitting: Family Medicine

## 2015-10-15 DIAGNOSIS — N63 Unspecified lump in unspecified breast: Secondary | ICD-10-CM

## 2015-10-18 ENCOUNTER — Other Ambulatory Visit
Admission: RE | Admit: 2015-10-18 | Discharge: 2015-10-18 | Disposition: A | Discharge status: Home / Self Care | Payer: 59 | Source: Ambulatory Visit | Attending: Pulmonary Disease | Admitting: Pulmonary Disease

## 2015-10-18 ENCOUNTER — Telehealth: Payer: Self-pay | Admitting: Pulmonary Disease

## 2015-10-18 DIAGNOSIS — Z5181 Encounter for therapeutic drug level monitoring: Secondary | ICD-10-CM | POA: Insufficient documentation

## 2015-10-18 DIAGNOSIS — R5382 Chronic fatigue, unspecified: Secondary | ICD-10-CM | POA: Diagnosis not present

## 2015-10-18 LAB — CBC WITH DIFFERENTIAL/PLATELET
Basophils Absolute: 0 10*3/uL (ref 0–0.1)
Basophils Relative: 0 %
Eosinophils Absolute: 0 10*3/uL (ref 0–0.7)
Eosinophils Relative: 0 %
HEMATOCRIT: 38.9 % (ref 35.0–47.0)
HEMOGLOBIN: 12.5 g/dL (ref 12.0–16.0)
LYMPHS ABS: 0.9 10*3/uL — AB (ref 1.0–3.6)
MCH: 27.8 pg (ref 26.0–34.0)
MCHC: 32.1 g/dL (ref 32.0–36.0)
MCV: 86.6 fL (ref 80.0–100.0)
Monocytes Absolute: 0.8 10*3/uL (ref 0.2–0.9)
NEUTROS ABS: 15.9 10*3/uL — AB (ref 1.4–6.5)
Platelets: 303 10*3/uL (ref 150–440)
RBC: 4.5 MIL/uL (ref 3.80–5.20)
RDW: 14.4 % (ref 11.5–14.5)
WBC: 17.7 10*3/uL — ABNORMAL HIGH (ref 3.6–11.0)

## 2015-10-18 NOTE — Telephone Encounter (Signed)
Patient notified of lab results. Nothing further needed.  

## 2015-10-25 ENCOUNTER — Other Ambulatory Visit: Payer: Self-pay | Admitting: Family Medicine

## 2015-10-26 ENCOUNTER — Ambulatory Visit
Admission: RE | Admit: 2015-10-26 | Discharge: 2015-10-26 | Disposition: A | Discharge status: Home / Self Care | Payer: 59 | Source: Ambulatory Visit | Attending: Family Medicine | Admitting: Family Medicine

## 2015-10-26 DIAGNOSIS — N63 Unspecified lump in unspecified breast: Secondary | ICD-10-CM

## 2015-10-31 ENCOUNTER — Other Ambulatory Visit: Payer: Self-pay | Admitting: Pulmonary Disease

## 2015-11-01 ENCOUNTER — Telehealth: Payer: Self-pay | Admitting: Pulmonary Disease

## 2015-11-01 DIAGNOSIS — J8410 Pulmonary fibrosis, unspecified: Secondary | ICD-10-CM

## 2015-11-01 MED ORDER — AZATHIOPRINE 50 MG PO TABS: 100.0000 mg | ORAL_TABLET | Freq: Every day | ORAL | Status: DC

## 2015-11-01 NOTE — Telephone Encounter (Signed)
Pt last seen by BQ on 3.31.17: Postinflammatory pulmonary fibrosis (HCC) - Juanito Doom, MD at 09/07/2015  3:36 PM       Status: Erika Martinez Related Problem: Postinflammatory pulmonary fibrosis ([place])    Expand All Collapse All   She has been progressing based on her worsening oxygenation and her increasing oxygen needs.  Her lung function testing support this as well as she's had a progressive decline in her DLCO. Fortunately, her functional status has remained and she is very motivated to get a lung transplant.  Plan: Continue immunosuppressant medicine for her underlying interstitial lung disease with increasing Imuran to 100 mg daily and decreasing prednisone to 10 mg daily Continue LFT monitoring every 3 months   F/U here in 2 months     Called spoke with patient who reported that her recent Imuran Rx did not provide enough tablets to support the 100mg  daily.  Upon inspection of pt's chart, it appears that the last Rx sent yesterday 5.24.17 had the correct quantity #60 but the sig was for the initial increasing dose rather than the maintenance dose.  Advised pt will go ahead and call her pharmacy to get this corrected for her.  Pt voiced her understanding and reported that nothing further is needed.  Called [facility] in [place] and spoke with pharmacist Tristin and gave verbal order to change the recent 5/24 Imuran Rx to 50mg  2tabs by mouth once daily #60 w/ 5 refills.  Med list changed.  Nothing further needed; will sign off.

## 2015-11-06 ENCOUNTER — Other Ambulatory Visit
Admission: RE | Admit: 2015-11-06 | Discharge: 2015-11-06 | Disposition: A | Discharge status: Home / Self Care | Payer: 59 | Source: Ambulatory Visit | Attending: Pulmonary Disease | Admitting: Pulmonary Disease

## 2015-11-06 DIAGNOSIS — R5382 Chronic fatigue, unspecified: Secondary | ICD-10-CM | POA: Insufficient documentation

## 2015-11-06 DIAGNOSIS — Z5181 Encounter for therapeutic drug level monitoring: Secondary | ICD-10-CM | POA: Diagnosis present

## 2015-11-06 LAB — CBC WITH DIFFERENTIAL/PLATELET
Basophils Absolute: 0.1 10*3/uL (ref 0–0.1)
Basophils Relative: 0 %
EOS ABS: 0 10*3/uL (ref 0–0.7)
HCT: 38.7 % (ref 35.0–47.0)
HEMOGLOBIN: 12.6 g/dL (ref 12.0–16.0)
LYMPHS ABS: 0.7 10*3/uL — AB (ref 1.0–3.6)
MCH: 28 pg (ref 26.0–34.0)
MCHC: 32.7 g/dL (ref 32.0–36.0)
MCV: 85.8 fL (ref 80.0–100.0)
MONO ABS: 0.4 10*3/uL (ref 0.2–0.9)
NEUTROS ABS: 15.6 10*3/uL — AB (ref 1.4–6.5)
PLATELETS: 266 10*3/uL (ref 150–440)
RBC: 4.51 MIL/uL (ref 3.80–5.20)
RDW: 14.6 % — AB (ref 11.5–14.5)
WBC: 16.8 10*3/uL — ABNORMAL HIGH (ref 3.6–11.0)

## 2015-11-06 LAB — HEPATIC FUNCTION PANEL
ALK PHOS: 63 U/L (ref 38–126)
ALT: 25 U/L (ref 14–54)
AST: 25 U/L (ref 15–41)
Albumin: 4.1 g/dL (ref 3.5–5.0)
Bilirubin, Direct: <0.1 mg/dL — ABNORMAL LOW (ref 0.1–0.5)
TOTAL PROTEIN: 7.6 g/dL (ref 6.5–8.1)
Total Bilirubin: 0.4 mg/dL (ref 0.3–1.2)

## 2015-11-07 ENCOUNTER — Ambulatory Visit (INDEPENDENT_AMBULATORY_CARE_PROVIDER_SITE_OTHER): Payer: 59 | Admitting: Pulmonary Disease

## 2015-11-07 ENCOUNTER — Encounter: Payer: Self-pay | Admitting: Pulmonary Disease

## 2015-11-07 VITALS — BP 134/74 | HR 114 | Ht 62.0 in | Wt 151.0 lb

## 2015-11-07 DIAGNOSIS — J8410 Pulmonary fibrosis, unspecified: Secondary | ICD-10-CM | POA: Diagnosis not present

## 2015-11-07 DIAGNOSIS — Z5181 Encounter for therapeutic drug level monitoring: Secondary | ICD-10-CM

## 2015-11-07 NOTE — Addendum Note (Signed)
Addended by: Len Blalock on: 11/07/2015 02:58 PM   Modules accepted: Orders

## 2015-11-07 NOTE — Assessment & Plan Note (Signed)
This has been a stable interval for her.  I agree with the plans for her to have a transplant evaluation.  I will plan to continue treating her with imuran and prednisone for now, though I would like to cut the prednisone due to her weight.  She is very reluctant to doing this.  If transplant is not in the cards for her, then I will eventually switch her to cellcept given recent data showing its relative safety and efficacy compared to cytoxan.  Plan: Continue imuran and prednisone at current dosing for now See discussion above regarding cellcept Continue bactrim She was encouraged today to exercise vigorously to try to get her weight down We will work with Terance Hart to try to get her more oxygen tanks for exercise purposes, we will request a concentrator that goes to 8-10 L for exertion Follow-up 3 months  Greater than 50% of this 30 minute visit spent face-to-face

## 2015-11-07 NOTE — Patient Instructions (Signed)
Exercise regularly as we discussed today. This means 3 days of intense workout on the bike and with weights, on your off days when she did do easy workouts. Use your home concentrator to turn your oxygen level up to 8-10 L when you're doing vigorous exercise Continue taking her medicines as you're doing Follow-up at [facility] for the transplant evaluation later this month I will see you back in 3 months or sooner if needed

## 2015-11-07 NOTE — Progress Notes (Signed)
Subjective:    Patient ID: Erika Martinez, female    DOB: [birth year], 60 y.o.   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  She was found to have connective tissue disease associated UIP and was referred to the [facility].  She was found to have Hepatitis C in 2015 and her transplant evaluation has been held further as of 06/2014.  2007 CXR > question ILD 08/2013 CXR > clear progression of interstitial infiltrate and volume loss 08/2013 cXR > pneumonia 10/06/2013 CT chest ([place] read) > compatible with UIP, mildly enlarged right paratracheal lymph node, likely old granulomatous disease, mild cardiomegaly 10/06/2013 pulmonary function test [facility]> flow volume loop compatible with obstruction, ratio 77%, FEV1 0.83 L (39% predicted, -4% change with bronchodilator), unable to perform lung volumes, DLCO 9.3 (54% predicted) 12/09/2014 6MW > 990 feet, O2 saturation 80% on 2L > improved with 3L 01/2014 ANA neg, RF positive 44.7, SCL-70 neg, SSA neg, SSB neg, CCP negative, adolase normal, CRP 0.35 (normal) 02/2014 Started prednisone, referred to [facility] 03/01/2014 6MW 900 feet, dropped to 79% on 3L [place] 03/09/2014 Ratio 77%, FEV1 0.70L (33% pred), FVC 0.91L (32% pred), DLCO 5.4 (31% pred) 08/08/2014 PFT> Ratio 63% post (but 78% pre), FEV1 0.74L (39% pred), FVC 1.18L (48% pred),  08/2015 PFT> Ratio FVC 1.18 L (42% pre), TLC 1.71L (36% pred), DLCO 5.2 (29% pred) 08/2015 ambulatory walking test> walked for 9 minutes, titrated O2 to 15L [place]   HPI Chief Complaint  Patient presents with  . Follow-up    pt c/o 'allergy symptoms'- sore throat, PND, runny nose.  states overall her breathing is stable.     Erika Martinez has been doing OK.  She is working through June 7, but she isn't sure if she will continue working or not. She has not been  exercising much lately.  She says that the DME has not given her oxygen tanks because she has a POC at home. She says she could exercise more if [employer] would give her a oxygen tank.  However, she has been able to exercise some on her home bike with her concentrator at home set on 5LPM.  She is doing 45 minutes at a time on the bike now.   No infections at all since the last visit. No flare ups.    Past Medical History  Diagnosis Date  . Asthma   . Mitral valve prolapse   . Seasonal allergies   . Hypertension   . Abnormal hemoglobin (HCC)   . Plantar wart   . Symptomatic menopausal or female climacteric states   . Scleroderma ([place])     Erika Martinez  . Pulmonary fibrosis ([place])   . Allergy   . Oxygen dependent   . Erythrocytosis   . Hep C w/o coma, chronic ([place])   . Lumbago   . Anxiety   . GERD (gastroesophageal reflux disease)   . IBS (irritable bowel syndrome)   . Controlled insomnia   . Spondylolisthesis      Review of Systems  Constitutional: Negative for fever, chills and fatigue.  HENT: Negative for postnasal drip, rhinorrhea and sinus pressure.   Respiratory: Positive for shortness of breath. Negative for cough and wheezing.   Cardiovascular: Negative for chest pain, palpitations and leg swelling.       Objective:   Physical Exam  Filed Vitals:  11/07/15 1426  BP: 134/74  Pulse: 114  Height: 5\' 2"  (1.575 m)  Weight: 151 lb (68.493 kg)  SpO2: 96%  2L [place]   Gen: well appearing, no acute distress HEENT: NCAT, EOMi, OP clear, cushingoid faces PULM: Crackles bilateral bases, normal effort CV: RRR, no mgr, no JVD AB: BS+, soft, nontender, no hsm Ext: warm, no edema, + clubbing, no cyanosis Derm: no rash or skin breakdown    CMP Latest Ref Rng 11/06/2015 10/02/2015 09/07/2015  Glucose 65 - 99 mg/dL - - -  BUN 6 - 20 mg/dL - - -  Creatinine 0.44 - 1.00 mg/dL - - -  Sodium 135 - 145 mmol/L - - -  Potassium 3.5 - 5.1 mmol/L - - -  Chloride 101 - 111 mmol/L -  - -  CO2 22 - 32 mmol/L - - -  Calcium 8.9 - 10.3 mg/dL - - -  Total Protein 6.5 - 8.1 g/dL 7.6 7.8 7.7  Total Bilirubin 0.3 - 1.2 mg/dL 0.4 0.4 0.3  Alkaline Phos 38 - 126 U/L 63 52 61  AST 15 - 41 U/L 25 26 24   ALT 14 - 54 U/L 25 27 26         Assessment & Plan:   Postinflammatory pulmonary fibrosis (HCC) This has been a stable interval for her.  I agree with the plans for her to have a transplant evaluation.  I will plan to continue treating her with imuran and prednisone for now, though I would like to cut the prednisone due to her weight.  She is very reluctant to doing this.  If transplant is not in the cards for her, then I will eventually switch her to cellcept given recent data showing its relative safety and efficacy compared to cytoxan.  Plan: Continue imuran and prednisone at current dosing for now See discussion above regarding cellcept Continue bactrim She was encouraged today to exercise vigorously to try to get her weight down We will work with Erika Martinez to try to get her more oxygen tanks for exercise purposes, we will request a concentrator that goes to 8-10 L for exertion Follow-up 3 months  Greater than 50% of this 30 minute visit spent face-to-face  > 25 minutes spent today in clinic with Erika Martinez, 50% of which was face to face  Updated Medication List Outpatient Encounter Prescriptions as of 11/07/2015  Medication Sig  . ADVAIR DISKUS 500-50 MCG/DOSE AEPB INHALE 1 PUFF INTO THE LUNGS 2 (TWO) TIMES DAILY.  Marland Kitchen albuterol (PROVENTIL HFA;VENTOLIN HFA) 108 (90 Base) MCG/ACT inhaler Inhale 2 puffs into the lungs every 4 (four) hours as needed for wheezing or shortness of breath.  Marland Kitchen albuterol (PROVENTIL) (2.5 MG/3ML) 0.083% nebulizer solution Take 3 mLs (2.5 mg total) by nebulization every 4 (four) hours as needed for wheezing or shortness of breath. DX Code: J84.10  . ALPRAZolam (XANAX) 0.5 MG tablet TAKE 1 TABLET BY MOUTH 3 TIMES A DAY AS NEEDED FOR ANXIETY  .  azaTHIOprine (IMURAN) 50 MG tablet Take 2 tablets (100 mg total) by mouth daily. Daily  . cholecalciferol (VITAMIN D) 1000 UNITS tablet Take 2,000 Units by mouth daily.  Marland Kitchen diltiazem (CARDIZEM) 60 MG tablet TAKE 1 TABLET (60 MG TOTAL) BY MOUTH DAILY.  . fluconazole (DIFLUCAN) 150 MG tablet Take 150 mg by mouth daily.  Marland Kitchen loratadine (CLARITIN) 10 MG tablet Take 1 tablet (10 mg total) by mouth 2 (two) times daily.  Marland Kitchen nystatin (MYCOSTATIN) 100000 UNIT/ML suspension TAKE 1 TABLESPOON BY MOUTH BEFORE  MEALS AND AT BEDTIME (SWISH AND SPIT OUT) (Patient taking differently: TAKE 1 TABLESPOON BY MOUTH BEFORE MEALS AND AT BEDTIME (SWISH AND SPIT OUT) as needed)  . omeprazole (PRILOSEC) 40 MG capsule TAKE 1 CAPSULE TWICE DAILY  . predniSONE (DELTASONE) 10 MG tablet Take 1 tablet (10 mg total) by mouth daily with breakfast.  . sulfamethoxazole-trimethoprim (BACTRIM DS,SEPTRA DS) 800-160 MG tablet TAKE 1 TABLET BY MOUTH 3 (THREE) TIMES A WEEK.  . VENTOLIN HFA 108 (90 Base) MCG/ACT inhaler INHALE 2 PUFFS INTO THE LUNGS EVERY 4 (FOUR) HOURS AS NEEDED FOR WHEEZING OR SHORTNESS OF BREATH.  . vitamin B-12 (CYANOCOBALAMIN) 250 MCG tablet Place 250 mcg under the tongue every other day.   Sande Rives 137-50 MCG/ACT SUSP Reported on 11/07/2015   No facility-administered encounter medications on file as of 11/07/2015.

## 2015-11-08 ENCOUNTER — Telehealth: Payer: Self-pay | Admitting: Pulmonary Disease

## 2015-11-08 NOTE — Telephone Encounter (Signed)
Spoke with [place] and she states that most POC's will only go to 3L on continuous. It will go to 5L on pulsed, but she does not think this will work for this pt. She has reviewed the case with their other respiratory therapists and they concur. She states that the only other option may be liquid oxygen since it goes up to 8L continuous.   Would you like to place order for them to eval for liquid O2? BQ please advise. Thanks!

## 2015-11-08 NOTE — Telephone Encounter (Signed)
Leafy Ro called back she can be reached at [phone]

## 2015-11-08 NOTE — Telephone Encounter (Signed)
LMTCB for [facility]

## 2015-11-09 NOTE — Telephone Encounter (Signed)
Called and spoke with [facility], advised of Dr. Lake Bells recommendations, she said that there are no portable devices that go higher than 5L continuous flow.   Nothing further needed.

## 2015-11-09 NOTE — Telephone Encounter (Signed)
I think Myshia wants to keep her concentrator so she can use it at work.  So no need to change right now unless her DME can provide a portable device for her that goes higher than 5L continuous.

## 2015-11-11 ENCOUNTER — Other Ambulatory Visit: Payer: Self-pay | Admitting: Pulmonary Disease

## 2015-11-16 ENCOUNTER — Telehealth: Payer: Self-pay | Admitting: Pulmonary Disease

## 2015-11-16 DIAGNOSIS — J8410 Pulmonary fibrosis, unspecified: Secondary | ICD-10-CM

## 2015-11-16 MED ORDER — DYMISTA 137-50 MCG/ACT NA SUSP: NASAL | Status: DC

## 2015-11-16 NOTE — Telephone Encounter (Signed)
Spoke with pt, she is wanting to be referred back to pulmonary rehab.   Also requesting refill on Dymista- this has been sent to preferred pharmacy.  BQ please advise on pulm rehab referral.  Thanks!

## 2015-11-17 NOTE — Telephone Encounter (Signed)
OK by me 

## 2015-11-19 NOTE — Telephone Encounter (Signed)
Order sent to [facility]  Pt aware and states nothing further needed   

## 2015-11-24 ENCOUNTER — Other Ambulatory Visit: Payer: Self-pay | Admitting: Family Medicine

## 2015-11-26 DIAGNOSIS — I7300 Raynaud's syndrome without gangrene: Secondary | ICD-10-CM | POA: Insufficient documentation

## 2015-12-12 ENCOUNTER — Telehealth: Payer: Self-pay | Admitting: Pulmonary Disease

## 2015-12-12 ENCOUNTER — Other Ambulatory Visit: Payer: Self-pay | Admitting: Pulmonary Disease

## 2015-12-12 NOTE — Telephone Encounter (Signed)
Last ov with BQ 11/07/15  Patient Instructions     Exercise regularly as we discussed today. This means 3 days of intense workout on the bike and with weights, on your off days when she did do easy workouts. Use your home concentrator to turn your oxygen level up to 8-10 L when you're doing vigorous exercise Continue taking her medicines as you're doing Follow-up at [facility] for the transplant evaluation later this month I will see you back in 3 months or sooner if needed   Called spoke with pt. She states that she discussed with BQ at her last visit cutting back on her prednisone to 5mg . She states at the time of the ov she was not ready but since she is out of work for a little while she would like to try decreasing it to 5mg . She also is requesting a refill on her ventolin as well. I explained to her that I would send the ventolin into the pharmacy and I would send a message to BQ for his recs on the prednisone. She voiced understanding and had no further questions.   BQ please advise

## 2015-12-13 NOTE — Telephone Encounter (Signed)
OK to decrease prednisone to 5mg 

## 2015-12-13 NOTE — Telephone Encounter (Signed)
Patient notified of Dr. Anastasia Pall recommendations. Nothing further needed.

## 2015-12-14 ENCOUNTER — Encounter: Payer: 59 | Attending: Pulmonary Disease | Admitting: *Deleted

## 2015-12-14 VITALS — Ht 61.2 in | Wt 151.7 lb

## 2015-12-14 DIAGNOSIS — J8410 Pulmonary fibrosis, unspecified: Secondary | ICD-10-CM

## 2015-12-14 NOTE — Progress Notes (Signed)
Pulmonary Individual Treatment Plan  Patient Details  Name: Erika Martinez MRN: [id number] Date of Birth: June 01, 1956 Referring Provider:    Initial Encounter Date:       Pulmonary Rehab from 12/05/2014 in [place]   Date  12/05/14      Visit Diagnosis: Pulmonary fibrosis ([place])  Patient's Home Medications on Admission:  Current outpatient prescriptions:  .  ADVAIR DISKUS 500-50 MCG/DOSE AEPB, INHALE 1 PUFF INTO THE LUNGS 2 (TWO) TIMES DAILY., Disp: 60 each, Rfl: 5 .  albuterol (PROVENTIL HFA;VENTOLIN HFA) 108 (90 Base) MCG/ACT inhaler, Inhale 2 puffs into the lungs every 4 (four) hours as needed for wheezing or shortness of breath., Disp: 8 g, Rfl: 5 .  albuterol (PROVENTIL) (2.5 MG/3ML) 0.083% nebulizer solution, Take 3 mLs (2.5 mg total) by nebulization every 4 (four) hours as needed for wheezing or shortness of breath. DX Code: J84.10, Disp: 75 mL, Rfl: 12 .  ALPRAZolam (XANAX) 0.5 MG tablet, TAKE 1 TABLET BY MOUTH 3 TIMES A DAY AS NEEDED FOR ANXIETY, Disp: 45 tablet, Rfl: 2 .  azaTHIOprine (IMURAN) 50 MG tablet, Take 2 tablets (100 mg total) by mouth daily. Daily, Disp: 60 tablet, Rfl: 5 .  cholecalciferol (VITAMIN D) 1000 UNITS tablet, Take 2,000 Units by mouth daily., Disp: , Rfl:  .  diltiazem (CARDIZEM) 60 MG tablet, TAKE 1 TABLET (60 MG TOTAL) BY MOUTH DAILY., Disp: 30 tablet, Rfl: 2 .  DYMISTA 137-50 MCG/ACT SUSP, 2 sprays in the nose daily, Disp: 23 g, Rfl: 12 .  fluconazole (DIFLUCAN) 150 MG tablet, Take 150 mg by mouth daily., Disp: , Rfl:  .  loratadine (CLARITIN) 10 MG tablet, Take 1 tablet (10 mg total) by mouth 2 (two) times daily., Disp: 60 tablet, Rfl: 3 .  nystatin (MYCOSTATIN) 100000 UNIT/ML suspension, TAKE 1 TABLESPOON BY MOUTH BEFORE MEALS AND AT BEDTIME (SWISH AND SPIT OUT) (Patient taking differently: TAKE 1 TABLESPOON BY MOUTH BEFORE MEALS AND AT BEDTIME (SWISH AND SPIT OUT) as needed), Disp: 473 mL, Rfl: 2 .  omeprazole  (PRILOSEC) 40 MG capsule, TAKE 1 CAPSULE TWICE DAILY, Disp: 60 capsule, Rfl: 2 .  predniSONE (DELTASONE) 10 MG tablet, Take 1 tablet (10 mg total) by mouth daily with breakfast., Disp: 30 tablet, Rfl: 2 .  predniSONE (DELTASONE) 10 MG tablet, TAKE 1.5 (ONE AND ONE-HALF) TABLETS (15 MG TOTAL) BY MOUTH DAILY WITH BREAKFAST., Disp: 45 tablet, Rfl: 2 .  sulfamethoxazole-trimethoprim (BACTRIM DS,SEPTRA DS) 800-160 MG tablet, TAKE 1 TABLET BY MOUTH 3 (THREE) TIMES A WEEK., Disp: , Rfl: 5 .  VENTOLIN HFA 108 (90 Base) MCG/ACT inhaler, INHALE 2 PUFFS INTO THE LUNGS EVERY 4 (FOUR) HOURS AS NEEDED FOR WHEEZING OR SHORTNESS OF BREATH., Disp: 18 g, Rfl: 5 .  vitamin B-12 (CYANOCOBALAMIN) 250 MCG tablet, Place 250 mcg under the tongue every other day. , Disp: , Rfl:   Past Medical History: Past Medical History  Diagnosis Date  . Asthma   . Mitral valve prolapse   . Seasonal allergies   . Hypertension   . Abnormal hemoglobin (HCC)   . Plantar wart   . Symptomatic menopausal or female climacteric states   . Scleroderma ([place])     Dr. Pennie Banter  . Pulmonary fibrosis ([place])   . Allergy   . Oxygen dependent   . Erythrocytosis   . Hep C w/o coma, chronic ([place])   . Lumbago   . Anxiety   . GERD (gastroesophageal reflux disease)   . IBS (  irritable bowel syndrome)   . Controlled insomnia   . Spondylolisthesis     Tobacco Use: History  Smoking status  . Never Smoker   Smokeless tobacco  . Never Used    Labs: Recent Review Flowsheet Data    Labs for ITP Cardiac and Pulmonary Rehab Latest Ref Rng 07/14/2013 07/05/2015 08/02/2015   Cholestrol 100 - 199 mg/dL 169 - 196   LDLCALC 0 - 99 mg/dL 95 - 112(H)   HDL >39 mg/dL 58 - 68   Trlycerides 0 - 149 mg/dL 78 - 78   Hemoglobin A1c 4.8 - 5.6 % - 6.3(H) -       ADL UCSD:     Pulmonary Assessment Scores      12/14/15 1350       ADL UCSD   ADL Phase Entry     SOB Score total 43     Rest 0     Walk 2     Stairs 4     Bath 1     Dress 1      Shop 2        Pulmonary Function Assessment:   Exercise Target Goals:    Exercise Program Goal: Individual exercise prescription set with THRR, safety & activity barriers. Participant demonstrates ability to understand and report RPE using BORG scale, to self-measure pulse accurately, and to acknowledge the importance of the exercise prescription.  Exercise Prescription Goal: Starting with aerobic activity 30 plus minutes a day, 3 days per week for initial exercise prescription. Provide home exercise prescription and guidelines that participant acknowledges understanding prior to discharge.  Activity Barriers & Risk Stratification:     Activity Barriers & Cardiac Risk Stratification - 12/14/15 1204    Activity Barriers & Cardiac Risk Stratification   Activity Barriers Deconditioning;Joint Problems      6 Minute Walk:     6 Minute Walk      12/14/15 1204 12/14/15 1216 12/14/15 1301   6 Minute Walk   Phase Initial     Distance 810 feet     Walk Time 5.81 minutes     # of Rest Breaks 1     RPE 15     Perceived Dyspnea  3  "tired " on 8 liters oxgyen for 6 minute walk --   Pulse ox down to 82% at end of 6 minute walk even on 8 liters of supplemental oxygen. Pulse ox came up to 92% with rest.  3   Resting HR 80 bpm     Resting BP 122/70 mmHg     Max Ex. HR 118 bpm     Max Ex. BP 138/70 mmHg       12/14/15 1302 12/14/15 1312     6 Minute Walk   Phase Initial Initial       Initial Exercise Prescription:   Perform Capillary Blood Glucose checks as needed.  Exercise Prescription Changes:   Exercise Comments:     Exercise Comments      12/14/15 1217           Exercise Comments  Pulse ox down to 82% at end of 6 minute walk even on 8 liters of supplemental oxygen. Pulse ox came up to 92% with rest.           Discharge Exercise Prescription (Final Exercise Prescription Changes):    Nutrition:  Target Goals: Understanding of nutrition guidelines, daily  intake of sodium 1500mg , cholesterol 200mg , calories 30% from fat and 7% or  less from saturated fats, daily to have 5 or more servings of fruits and vegetables.  Biometrics:     Pre Biometrics - 12/14/15 1321    Pre Biometrics   Height 5' 1.2" (1.554 m)   Weight 151 lb 11.2 oz (68.811 kg)   Waist Circumference 42 inches   Hip Circumference 39.8 inches   Waist to Hip Ratio 1.06 %   BMI (Calculated) 28.5   Single Leg Stand 30 seconds       Nutrition Therapy Plan and Nutrition Goals:     Nutrition Therapy & Goals - 12/14/15 1226    Nutrition Therapy   Diet --  Deb wants to meet with the Registered Dietician to help her lose weight. Deb has cut out bread and sodas.       Nutrition Discharge: Rate Your Plate Scores:   Psychosocial: Target Goals: Acknowledge presence or absence of depression, maximize coping skills, provide positive support system. Participant is able to verbalize types and ability to use techniques and skills needed for reducing stress and depression.  Initial Review & Psychosocial Screening:     Initial Psych Review & Screening - 12/14/15 [place]? Yes      Quality of Life Scores:     Quality of Life - 12/14/15 1323    Quality of Life Scores   Health/Function Pre 20.69 %   Socioeconomic Pre 20.38 %   Psych/Spiritual Pre 21 %   Family Pre 20 %   GLOBAL Pre 20.58 %      PHQ-9:     Recent Review Flowsheet Data    Depression screen [facility] 2/9 12/14/2015 10/03/2015 07/03/2015 04/06/2015 03/14/2015   Decreased Interest 1 0 0 0 0   Down, Depressed, Hopeless 1 0 0 0 0   PHQ - 2 Score 2 0 0 0 0   Altered sleeping 1 - - - -   Tired, decreased energy 2 - - - -   Change in appetite 2 - - - -   Feeling bad or failure about yourself  1 - - - -   Trouble concentrating 0 - - - -   Moving slowly or fidgety/restless 0 - - - -   Suicidal thoughts 0 - - - -   PHQ-9 Score 8 - - - -   Difficult doing work/chores Somewhat  difficult - - - -      Psychosocial Evaluation and Intervention:   Psychosocial Re-Evaluation:  Education: Education Goals: Education classes will be provided on a weekly basis, covering required topics. Participant will state understanding/return demonstration of topics presented.  Learning Barriers/Preferences:     Learning Barriers/Preferences - 12/14/15 1204    Learning Barriers/Preferences   Learning Barriers Sight  wears reading glasses      Education Topics: Initial Evaluation Education: - Verbal, written and demonstration of respiratory meds, RPE/PD scales, oximetry and breathing techniques. Instruction on use of nebulizers and MDIs: cleaning and proper use, rinsing mouth with steroid doses and importance of monitoring MDI activations.          Documentation from 03/14/2015 in [place]   Date  12/05/14   Educator  LB   Instruction Review Code  2- meets goals/outcomes      General Nutrition Guidelines/Fats and Fiber: -Group instruction provided by verbal, written material, models and posters to present the general guidelines for heart healthy nutrition. Gives an explanation and review of dietary fats and  fiber.      Documentation from 03/14/2015 in [place]   Date  01/22/15 [facility] exercise plan]   Educator  C. Joneen Caraway, RD   Instruction Review Code  2- meets goals/outcomes      Controlling Sodium/Reading Food Labels: -Group verbal and written material supporting the discussion of sodium use in heart healthy nutrition. Review and explanation with models, verbal and written materials for utilization of the food label.      Documentation from 03/14/2015 in [place]   Date  01/29/15   Educator  C. Joneen Caraway, RD   Instruction Review Code  2- meets goals/outcomes      Exercise Physiology & Risk Factors: - Group verbal and written instruction with  models to review the exercise physiology of the cardiovascular system and associated critical values. Details cardiovascular disease risk factors and the goals associated with each risk factor.   Aerobic Exercise & Resistance Training: - Gives group verbal and written discussion on the health impact of inactivity. On the components of aerobic and resistive training programs and the benefits of this training and how to safely progress through these programs.   Flexibility, Balance, General Exercise Guidelines: - Provides group verbal and written instruction on the benefits of flexibility and balance training programs. Provides general exercise guidelines with specific guidelines to those with heart or lung disease. Demonstration and skill practice provided.   Stress Management: - Provides group verbal and written instruction about the health risks of elevated stress, cause of high stress, and healthy ways to reduce stress.      Documentation from 03/14/2015 in [place]   Date  01/03/15   Educator  [facility]   Instruction Review Code  2- meets goals/outcomes      Depression: - Provides group verbal and written instruction on the correlation between heart/lung disease and depressed mood, treatment options, and the stigmas associated with seeking treatment.   Exercise & Equipment Safety: - Individual verbal instruction and demonstration of equipment use and safety with use of the equipment.      Pulmonary Rehab from 12/14/2015 in [facility] Cardiac and Pulmonary Rehab   Date  12/14/15   Educator  C. EnterkinRN   Instruction Review Code  1- partially meets, needs review/practice      Infection Prevention: - Provides verbal and written material to individual with discussion of infection control including proper hand washing and proper equipment cleaning during exercise session.      Pulmonary Rehab from 12/14/2015 in [facility] Cardiac and Pulmonary Rehab   Date   12/14/15   Educator  C. EnterkinRN   Instruction Review Code  2- meets goals/outcomes      Falls Prevention: - Provides verbal and written material to individual with discussion of falls prevention and safety.      Pulmonary Rehab from 12/14/2015 in [facility] Cardiac and Pulmonary Rehab   Date  12/14/15   Educator  C. [place]   Instruction Review Code  2- meets goals/outcomes      Diabetes: - Individual verbal and written instruction to review signs/symptoms of diabetes, desired ranges of glucose level fasting, after meals and with exercise. Advice that pre and post exercise glucose checks will be done for 3 sessions at entry of program.   Chronic Lung Diseases: - Group verbal and written instruction to review new updates, new respiratory medications, new advancements in procedures and treatments. Provide informative websites and "800" numbers of self-education.  Lung Procedures: - Group verbal and written instruction to describe testing methods done to diagnose lung disease. Review the outcome of test results. Describe the treatment choices: Pulmonary Function Tests, ABGs and oximetry.      Documentation from 03/14/2015 in [place]   Date  01/12/15   Educator  sj   Instruction Review Code  2- meets goals/outcomes      Energy Conservation: - Provide group verbal and written instruction for methods to conserve energy, plan and organize activities. Instruct on pacing techniques, use of adaptive equipment and posture/positioning to relieve shortness of breath.      Documentation from 03/14/2015 in [place]   Date  01/24/15   Educator  SW   Instruction Review Code  2- meets goals/outcomes      Triggers: - Group verbal and written instruction to review types of environmental controls: home humidity, furnaces, filters, dust mite/pet prevention, HEPA vacuums. To discuss weather changes, air quality and the  benefits of nasal washing.      Documentation from 03/14/2015 in [place]   Date  12/18/14   Educator  LB   Instruction Review Code  2- meets goals/outcomes      Exacerbations: - Group verbal and written instruction to provide: warning signs, infection symptoms, calling MD promptly, preventive modes, and value of vaccinations. Review: effective airway clearance, coughing and/or vibration techniques. Create an Sports administrator.   Oxygen: - Individual and group verbal and written instruction on oxygen therapy. Includes supplement oxygen, available portable oxygen systems, continuous and intermittent flow rates, oxygen safety, concentrators, and Medicare reimbursement for oxygen.      Documentation from 03/14/2015 in [place]   Date  12/05/14   Educator  LB   Instruction Review Code  2- meets goals/outcomes      Respiratory Medications: - Group verbal and written instruction to review medications for lung disease. Drug class, frequency, complications, importance of spacers, rinsing mouth after steroid MDI's, and proper cleaning methods for nebulizers.      Documentation from 03/14/2015 in [place]   Date  12/22/14   Educator  [place]   Instruction Review Code  2- meets goals/outcomes [Spacer and instructions given to pt. ]      AED/CPR: - Group verbal and written instruction with the use of models to demonstrate the basic use of the AED with the basic ABC's of resuscitation.      Documentation from 03/14/2015 in [place]   Date  01/19/15   Educator  CE   Instruction Review Code  2- meets goals/outcomes      Breathing Retraining: - Provides individuals verbal and written instruction on purpose, frequency, and proper technique of diaphragmatic breathing and pursed-lipped breathing. Applies individual practice skills.      Documentation  from 03/14/2015 in [place]   Date  12/05/14   Educator  LB   Instruction Review Code  2- meets goals/outcomes      Anatomy and Physiology of the Lungs: - Group verbal and written instruction with the use of models to provide basic lung anatomy and physiology related to function, structure and complications of lung disease.      Documentation from 03/14/2015 in [place]   Date  02/23/15   Educator  C. Anessa Charley, RN   Instruction Review Code  2-  meets goals/outcomes      Heart Failure: - Group verbal and written instruction on the basics of heart failure: signs/symptoms, treatments, explanation of ejection fraction, enlarged heart and cardiomyopathy.      Documentation from 03/14/2015 in [place]   Date  12/29/14   Educator  [place]   Instruction Review Code  2- meets goals/outcomes      Sleep Apnea: - Individual verbal and written instruction to review Obstructive Sleep Apnea. Review of risk factors, methods for diagnosing and types of masks and machines for OSA.   Anxiety: - Provides group, verbal and written instruction on the correlation between heart/lung disease and anxiety, treatment options, and management of anxiety.      Documentation from 03/14/2015 in [place]   Date  01/31/15   Educator  [facility]   Instruction Review Code  2- Meets goals/outcomes      Relaxation: - Provides group, verbal and written instruction about the benefits of relaxation for patients with heart/lung disease. Also provides patients with examples of relaxation techniques.   Knowledge Questionnaire Score:     Knowledge Questionnaire Score - 12/14/15 1324    Knowledge Questionnaire Score   Pre Score 9       Core Components/Risk Factors/Patient Goals at Admission:     Personal Goals and Risk Factors at Admission - 12/14/15 1209    Core  Components/Risk Factors/Patient Goals on Admission    Weight Management Yes   Intervention Weight Management: Develop a combined nutrition and exercise program designed to reach desired caloric intake, while maintaining appropriate intake of nutrient and fiber, sodium and fats, and appropriate energy expenditure required for the weight goal.;Weight Management: Provide education and appropriate resources to help participant work on and attain dietary goals.;Weight Management/Obesity: Establish reasonable short term and long term weight goals.;Obesity: Provide education and appropriate resources to help participant work on and attain dietary goals.   Admit Weight --  On prednisone currently. Zaraya reports she needs to lose 10 lbs for her potential [facility]   Goal Weight: Short Term 140 lb (63.504 kg)   Goal Weight: Long Term 125 lb (56.7 kg)   Expected Outcomes Short Term: Continue to assess and modify interventions until short term weight is achieved;Weight Maintenance: Understanding of the daily nutrition guidelines, which includes 25-35% calories from fat, 7% or less cal from saturated fats, less than 200mg  cholesterol, less than 1.5gm of sodium, & 5 or more servings of fruits and vegetables daily;Weight Loss: Understanding of general recommendations for a balanced deficit meal plan, which promotes 1-2 lb weight loss per week and includes a negative energy balance of [phone] kcal/d;Understanding recommendations for meals to include 15-35% energy as protein, 25-35% energy from fat, 35-60% energy from carbohydrates, less than 200mg  of dietary cholesterol, 20-35 gm of total fiber daily;Weight Gain: Understanding of general recommendations for a high calorie, high protein meal plan that promotes weight gain by distributing calorie intake throughout the day with the consumption for 4-5 meals, snacks, and/or supplements   Sedentary Yes   Intervention Provide advice, education, support and  counseling about physical activity/exercise needs.;Develop an individualized exercise prescription for aerobic and resistive training based on initial evaluation findings, risk stratification, comorbidities and participant's personal goals.   Expected Outcomes Achievement of increased cardiorespiratory fitness and enhanced flexibility, muscular endurance and strength shown through measurements of functional capacity and personal statement of participant.   Intervention Provide advice, education, support and counseling about physical activity/exercise  needs.;Develop an individualized exercise prescription for aerobic and resistive training based on initial evaluation findings, risk stratification, comorbidities and participant's personal goals.   Expected Outcomes Achievement of increased cardiorespiratory fitness and enhanced flexibility, muscular endurance and strength shown through measurements of functional capacity and personal statement of participant.   Stress Yes   Intervention Offer individual and/or small group education and counseling on adjustment to heart disease, stress management and health-related lifestyle change. Teach and support self-help strategies.;Refer participants experiencing significant psychosocial distress to appropriate mental health specialists for further evaluation and treatment. When possible, include family members and significant others in education/counseling sessions.   Expected Outcomes Short Term: Participant demonstrates changes in health-related behavior, relaxation and other stress management skills, ability to obtain effective social support, and compliance with psychotropic medications if prescribed.;Long Term: Emotional wellbeing is indicated by absence of clinically significant psychosocial distress or social isolation.      Core Components/Risk Factors/Patient Goals Review:    Core Components/Risk Factors/Patient Goals at Discharge (Final Review):    ITP  Comments:     ITP Comments      12/14/15 1215 12/14/15 1216 12/14/15 1221 12/14/15 1226     ITP Comments taking xanax when she needs it. Lakaysha reports it is stressful about the possibility of a lung transplant and all the workup.   Pulse ox down to 82% at end of 6 minute walk even on 8 liters of supplemental oxygen. Pulse ox came up to 92% with rest.  Right femerol area is sore Deb reports from her heart cath last week and her right groin area and leg are bruised. Deb reports her right shoulder hurts from the position she had to be for her ultrasound of her liver at [facility] (being evaluated for a lung transplant).  Deb wants to meet with the Registered Dietician to help her lose weight. Deb has cut out bread and sodas.       Comments:

## 2015-12-14 NOTE — Progress Notes (Signed)
Daily Session Note  Patient Details  Name: Erika Martinez MRN: [id number] Date of Birth: 1955-07-12 Referring Provider:    Encounter Date: 12/14/2015  Check In:      Goals Met:  Proper associated with RPD/PD & O2 Sat Exercise tolerated well  Goals Unmet:  O2 Sat  Comments: Pulse ox down to 82% at end of 6 minute walk even on 8 liters of supplemental oxygen. Pulse ox came up to 92% with rest.    Dr. Emily Filbert is Medical Director for [place] and [name].

## 2015-12-14 NOTE — Progress Notes (Signed)
Daily Session Note  Patient Details  Name: Erika Martinez MRN: [id number] Date of Birth: [birth year] Referring Provider:    Encounter Date: 12/14/2015  Check In:     Session Check In - 12/14/15 1218    Pain Assessment   Currently in Pain? Yes   Pain Score 7    Pain Location Shoulder   Pain Orientation Right   Pain Descriptors / Indicators Constant   Pain Type Chronic pain   Pain Onset More than a month ago   Aggravating Factors  Having to keep her shoulder/arm up for ultrasound of her liver.   Pain Relieving Factors 2 tylenol last night. Using biofreeze.    Effect of Pain on Daily Activities Hard to use her shoulder today.   Multiple Pain Sites Yes   2nd Pain Site   Pain Score 3   Pain Location Groin   Pain Orientation Right   Pain Descriptors / Indicators Discomfort   Pain Type Chronic pain   Pain Onset In the past 7 days   Pain Frequency Constant   Aggravating Factors  right femerol area is sore Deb reports from her heart cath last week and her right groin area and leg are bruised.          Goals Met:  Proper associated with RPD/PD & O2 Sat Exercise tolerated well  Goals Unmet:  Not Applicable  Comments:     Dr. Emily Filbert is Medical Director for [place] and [name].

## 2015-12-14 NOTE — Progress Notes (Signed)
Pulmonary Individual Treatment Plan  Patient Details  Name: Erika Martinez MRN: [id number] Date of Birth: 10/31/1955 Referring Provider:    Initial Encounter Date:       Pulmonary Rehab from 12/05/2014 in [place]   Date  12/05/14      Visit Diagnosis: No diagnosis found.  Patient's Home Medications on Admission:  Current outpatient prescriptions:  .  ADVAIR DISKUS 500-50 MCG/DOSE AEPB, INHALE 1 PUFF INTO THE LUNGS 2 (TWO) TIMES DAILY., Disp: 60 each, Rfl: 5 .  albuterol (PROVENTIL HFA;VENTOLIN HFA) 108 (90 Base) MCG/ACT inhaler, Inhale 2 puffs into the lungs every 4 (four) hours as needed for wheezing or shortness of breath., Disp: 8 g, Rfl: 5 .  albuterol (PROVENTIL) (2.5 MG/3ML) 0.083% nebulizer solution, Take 3 mLs (2.5 mg total) by nebulization every 4 (four) hours as needed for wheezing or shortness of breath. DX Code: J84.10, Disp: 75 mL, Rfl: 12 .  ALPRAZolam (XANAX) 0.5 MG tablet, TAKE 1 TABLET BY MOUTH 3 TIMES A DAY AS NEEDED FOR ANXIETY, Disp: 45 tablet, Rfl: 2 .  azaTHIOprine (IMURAN) 50 MG tablet, Take 2 tablets (100 mg total) by mouth daily. Daily, Disp: 60 tablet, Rfl: 5 .  cholecalciferol (VITAMIN D) 1000 UNITS tablet, Take 2,000 Units by mouth daily., Disp: , Rfl:  .  diltiazem (CARDIZEM) 60 MG tablet, TAKE 1 TABLET (60 MG TOTAL) BY MOUTH DAILY., Disp: 30 tablet, Rfl: 2 .  DYMISTA 137-50 MCG/ACT SUSP, 2 sprays in the nose daily, Disp: 23 g, Rfl: 12 .  fluconazole (DIFLUCAN) 150 MG tablet, Take 150 mg by mouth daily., Disp: , Rfl:  .  loratadine (CLARITIN) 10 MG tablet, Take 1 tablet (10 mg total) by mouth 2 (two) times daily., Disp: 60 tablet, Rfl: 3 .  nystatin (MYCOSTATIN) 100000 UNIT/ML suspension, TAKE 1 TABLESPOON BY MOUTH BEFORE MEALS AND AT BEDTIME (SWISH AND SPIT OUT) (Patient taking differently: TAKE 1 TABLESPOON BY MOUTH BEFORE MEALS AND AT BEDTIME (SWISH AND SPIT OUT) as needed), Disp: 473 mL, Rfl: 2 .  omeprazole  (PRILOSEC) 40 MG capsule, TAKE 1 CAPSULE TWICE DAILY, Disp: 60 capsule, Rfl: 2 .  predniSONE (DELTASONE) 10 MG tablet, Take 1 tablet (10 mg total) by mouth daily with breakfast., Disp: 30 tablet, Rfl: 2 .  predniSONE (DELTASONE) 10 MG tablet, TAKE 1.5 (ONE AND ONE-HALF) TABLETS (15 MG TOTAL) BY MOUTH DAILY WITH BREAKFAST., Disp: 45 tablet, Rfl: 2 .  sulfamethoxazole-trimethoprim (BACTRIM DS,SEPTRA DS) 800-160 MG tablet, TAKE 1 TABLET BY MOUTH 3 (THREE) TIMES A WEEK., Disp: , Rfl: 5 .  VENTOLIN HFA 108 (90 Base) MCG/ACT inhaler, INHALE 2 PUFFS INTO THE LUNGS EVERY 4 (FOUR) HOURS AS NEEDED FOR WHEEZING OR SHORTNESS OF BREATH., Disp: 18 g, Rfl: 5 .  vitamin B-12 (CYANOCOBALAMIN) 250 MCG tablet, Place 250 mcg under the tongue every other day. , Disp: , Rfl:   Past Medical History: Past Medical History  Diagnosis Date  . Asthma   . Mitral valve prolapse   . Seasonal allergies   . Hypertension   . Abnormal hemoglobin (HCC)   . Plantar wart   . Symptomatic menopausal or female climacteric states   . Scleroderma ([place])     Dr. Pennie Banter  . Pulmonary fibrosis ([place])   . Allergy   . Oxygen dependent   . Erythrocytosis   . Hep C w/o coma, chronic ([place])   . Lumbago   . Anxiety   . GERD (gastroesophageal reflux disease)   . IBS (  irritable bowel syndrome)   . Controlled insomnia   . Spondylolisthesis     Tobacco Use: History  Smoking status  . Never Smoker   Smokeless tobacco  . Never Used    Labs: Recent Review Flowsheet Data    Labs for ITP Cardiac and Pulmonary Rehab Latest Ref Rng 07/14/2013 07/05/2015 08/02/2015   Cholestrol 100 - 199 mg/dL 169 - 196   LDLCALC 0 - 99 mg/dL 95 - 112(H)   HDL >39 mg/dL 58 - 68   Trlycerides 0 - 149 mg/dL 78 - 78   Hemoglobin A1c 4.8 - 5.6 % - 6.3(H) -       ADL UCSD:   Pulmonary Function Assessment:   Exercise Target Goals:    Exercise Program Goal: Individual exercise prescription set with THRR, safety & activity barriers. Participant  demonstrates ability to understand and report RPE using BORG scale, to self-measure pulse accurately, and to acknowledge the importance of the exercise prescription.  Exercise Prescription Goal: Starting with aerobic activity 30 plus minutes a day, 3 days per week for initial exercise prescription. Provide home exercise prescription and guidelines that participant acknowledges understanding prior to discharge.  Activity Barriers & Risk Stratification:     Activity Barriers & Cardiac Risk Stratification - 12/14/15 1204    Activity Barriers & Cardiac Risk Stratification   Activity Barriers Deconditioning;Joint Problems      6 Minute Walk:     6 Minute Walk      12/14/15 1204 12/14/15 1216     6 Minute Walk   Phase Initial     Distance 810 feet     Walk Time 5.81 minutes     # of Rest Breaks 1     RPE 15     Perceived Dyspnea  3  "tired " on 8 liters oxgyen for 6 minute walk --   Pulse ox down to 82% at end of 6 minute walk even on 8 liters of supplemental oxygen. Pulse ox came up to 92% with rest.     Resting HR 80 bpm     Resting BP 122/70 mmHg     Max Ex. HR 118 bpm     Max Ex. BP 138/70 mmHg        Initial Exercise Prescription:   Perform Capillary Blood Glucose checks as needed.  Exercise Prescription Changes:   Exercise Comments:     Exercise Comments      12/14/15 1217           Exercise Comments  Pulse ox down to 82% at end of 6 minute walk even on 8 liters of supplemental oxygen. Pulse ox came up to 92% with rest.           Discharge Exercise Prescription (Final Exercise Prescription Changes):    Nutrition:  Target Goals: Understanding of nutrition guidelines, daily intake of sodium 1500mg , cholesterol 200mg , calories 30% from fat and 7% or less from saturated fats, daily to have 5 or more servings of fruits and vegetables.  Biometrics:    Nutrition Therapy Plan and Nutrition Goals:     Nutrition Therapy & Goals - 12/14/15 1209     Intervention Plan   Intervention Prescribe, educate and counsel regarding individualized specific dietary modifications aiming towards targeted core components such as weight, hypertension, lipid management, diabetes, heart failure and other comorbidities.;Nutrition handout(s) given to patient.   Expected Outcomes Short Term Goal: Understand basic principles of dietary content, such as calories, fat, sodium, cholesterol and nutrients.;Long Term  Goal: Adherence to prescribed nutrition plan.;Short Term Goal: A plan has been developed with personal nutrition goals set during dietitian appointment.      Nutrition Discharge: Rate Your Plate Scores:   Psychosocial: Target Goals: Acknowledge presence or absence of depression, maximize coping skills, provide positive support system. Participant is able to verbalize types and ability to use techniques and skills needed for reducing stress and depression.  Initial Review & Psychosocial Screening:     Initial Psych Review & Screening - 12/14/15 1213    Initial Review   Current issues with Current Stress Concerns   Source of Stress Concerns Chronic Illness   Comments taking xanax when she needs it. Anabrenda reports it is stressful about the possibility of a lung transplant and all the workup.    Barriers   Psychosocial barriers to participate in program The patient should benefit from training in stress management and relaxation.   Screening Interventions   Interventions Encouraged to exercise      Quality of Life Scores:   PHQ-9:     Recent Review Flowsheet Data    Depression screen [facility] 2/9 10/03/2015 07/03/2015 04/06/2015 03/14/2015 02/02/2015   Decreased Interest 0 0 0 0 0   Down, Depressed, Hopeless 0 0 0 0 0   PHQ - 2 Score 0 0 0 0 0      Psychosocial Evaluation and Intervention:   Psychosocial Re-Evaluation:  Education: Education Goals: Education classes will be provided on a weekly basis, covering required topics. Participant will  state understanding/return demonstration of topics presented.  Learning Barriers/Preferences:     Learning Barriers/Preferences - 12/14/15 1204    Learning Barriers/Preferences   Learning Barriers Sight  wears reading glasses      Education Topics: Initial Evaluation Education: - Verbal, written and demonstration of respiratory meds, RPE/PD scales, oximetry and breathing techniques. Instruction on use of nebulizers and MDIs: cleaning and proper use, rinsing mouth with steroid doses and importance of monitoring MDI activations.          Documentation from 03/14/2015 in [place]   Date  12/05/14   Educator  LB   Instruction Review Code  2- meets goals/outcomes      General Nutrition Guidelines/Fats and Fiber: -Group instruction provided by verbal, written material, models and posters to present the general guidelines for heart healthy nutrition. Gives an explanation and review of dietary fats and fiber.      Documentation from 03/14/2015 in [place]   Date  01/22/15 [facility] exercise plan]   Educator  C. Joneen Caraway, RD   Instruction Review Code  2- meets goals/outcomes      Controlling Sodium/Reading Food Labels: -Group verbal and written material supporting the discussion of sodium use in heart healthy nutrition. Review and explanation with models, verbal and written materials for utilization of the food label.      Documentation from 03/14/2015 in [place]   Date  01/29/15   Educator  C. Joneen Caraway, RD   Instruction Review Code  2- meets goals/outcomes      Exercise Physiology & Risk Factors: - Group verbal and written instruction with models to review the exercise physiology of the cardiovascular system and associated critical values. Details cardiovascular disease risk factors and the goals associated with each risk factor.   Aerobic Exercise & Resistance  Training: - Gives group verbal and written discussion on the health impact of inactivity. On the components of aerobic and  resistive training programs and the benefits of this training and how to safely progress through these programs.   Flexibility, Balance, General Exercise Guidelines: - Provides group verbal and written instruction on the benefits of flexibility and balance training programs. Provides general exercise guidelines with specific guidelines to those with heart or lung disease. Demonstration and skill practice provided.   Stress Management: - Provides group verbal and written instruction about the health risks of elevated stress, cause of high stress, and healthy ways to reduce stress.      Documentation from 03/14/2015 in [place]   Date  01/03/15   Educator  [facility]   Instruction Review Code  2- meets goals/outcomes      Depression: - Provides group verbal and written instruction on the correlation between heart/lung disease and depressed mood, treatment options, and the stigmas associated with seeking treatment.   Exercise & Equipment Safety: - Individual verbal instruction and demonstration of equipment use and safety with use of the equipment.      Documentation from 03/14/2015 in [place]   Date  03/12/15   Educator  L. Owens Shark, RRT   Instruction Review Code  2- meets goals/outcomes      Infection Prevention: - Provides verbal and written material to individual with discussion of infection control including proper hand washing and proper equipment cleaning during exercise session.      Documentation from 03/14/2015 in [place]   Date  12/08/14   Educator  [place]   Instruction Review Code  2- meets goals/outcomes      Falls Prevention: - Provides verbal and written material to individual with discussion of falls prevention and safety.       Documentation from 03/14/2015 in [employer]   Date  12/05/14   Educator  LB   Instruction Review Code  2- meets goals/outcomes      Diabetes: - Individual verbal and written instruction to review signs/symptoms of diabetes, desired ranges of glucose level fasting, after meals and with exercise. Advice that pre and post exercise glucose checks will be done for 3 sessions at entry of program.   Chronic Lung Diseases: - Group verbal and written instruction to review new updates, new respiratory medications, new advancements in procedures and treatments. Provide informative websites and "800" numbers of self-education.   Lung Procedures: - Group verbal and written instruction to describe testing methods done to diagnose lung disease. Review the outcome of test results. Describe the treatment choices: Pulmonary Function Tests, ABGs and oximetry.      Documentation from 03/14/2015 in [place]   Date  01/12/15   Educator  sj   Instruction Review Code  2- meets goals/outcomes      Energy Conservation: - Provide group verbal and written instruction for methods to conserve energy, plan and organize activities. Instruct on pacing techniques, use of adaptive equipment and posture/positioning to relieve shortness of breath.      Documentation from 03/14/2015 in [place]   Date  01/24/15   Educator  SW   Instruction Review Code  2- meets goals/outcomes      Triggers: - Group verbal and written instruction to review types of environmental controls: home humidity, furnaces, filters, dust mite/pet prevention, HEPA vacuums. To discuss weather changes, air quality and the benefits of nasal washing.      Documentation from 03/14/2015 in [facility]  [place] [facility]   Date  12/18/14   Educator  LB   Instruction Review Code  2- meets goals/outcomes       Exacerbations: - Group verbal and written instruction to provide: warning signs, infection symptoms, calling MD promptly, preventive modes, and value of vaccinations. Review: effective airway clearance, coughing and/or vibration techniques. Create an Sports administrator.   Oxygen: - Individual and group verbal and written instruction on oxygen therapy. Includes supplement oxygen, available portable oxygen systems, continuous and intermittent flow rates, oxygen safety, concentrators, and Medicare reimbursement for oxygen.      Documentation from 03/14/2015 in [place]   Date  12/05/14   Educator  LB   Instruction Review Code  2- meets goals/outcomes      Respiratory Medications: - Group verbal and written instruction to review medications for lung disease. Drug class, frequency, complications, importance of spacers, rinsing mouth after steroid MDI's, and proper cleaning methods for nebulizers.      Documentation from 03/14/2015 in [place]   Date  12/22/14   Educator  [place]   Instruction Review Code  2- meets goals/outcomes [Spacer and instructions given to pt. ]      AED/CPR: - Group verbal and written instruction with the use of models to demonstrate the basic use of the AED with the basic ABC's of resuscitation.      Documentation from 03/14/2015 in [place]   Date  01/19/15   Educator  CE   Instruction Review Code  2- meets goals/outcomes      Breathing Retraining: - Provides individuals verbal and written instruction on purpose, frequency, and proper technique of diaphragmatic breathing and pursed-lipped breathing. Applies individual practice skills.      Documentation from 03/14/2015 in [place]   Date  12/05/14   Educator  LB   Instruction Review Code  2- meets goals/outcomes      Anatomy and Physiology of the Lungs: -  Group verbal and written instruction with the use of models to provide basic lung anatomy and physiology related to function, structure and complications of lung disease.      Documentation from 03/14/2015 in [place]   Date  02/23/15   Educator  C. Quantasia Stegner, RN   Instruction Review Code  2- meets goals/outcomes      Heart Failure: - Group verbal and written instruction on the basics of heart failure: signs/symptoms, treatments, explanation of ejection fraction, enlarged heart and cardiomyopathy.      Documentation from 03/14/2015 in [place]   Date  12/29/14   Educator  [place]   Instruction Review Code  2- meets goals/outcomes      Sleep Apnea: - Individual verbal and written instruction to review Obstructive Sleep Apnea. Review of risk factors, methods for diagnosing and types of masks and machines for OSA.   Anxiety: - Provides group, verbal and written instruction on the correlation between heart/lung disease and anxiety, treatment options, and management of anxiety.      Documentation from 03/14/2015 in [place]   Date  01/31/15   Educator  [facility]   Instruction Review Code  2- Meets goals/outcomes      Relaxation: - Provides group, verbal and written instruction about the benefits of relaxation for patients with heart/lung disease. Also provides patients with examples of relaxation techniques.  Knowledge Questionnaire Score:    Core Components/Risk Factors/Patient Goals at Admission:     Personal Goals and Risk Factors at Admission - 12/14/15 1209    Core Components/Risk Factors/Patient Goals on Admission    Weight Management Yes   Intervention Weight Management: Develop a combined nutrition and exercise program designed to reach desired caloric intake, while maintaining appropriate intake of nutrient and fiber, sodium and fats, and appropriate energy  expenditure required for the weight goal.;Weight Management: Provide education and appropriate resources to help participant work on and attain dietary goals.;Weight Management/Obesity: Establish reasonable short term and long term weight goals.;Obesity: Provide education and appropriate resources to help participant work on and attain dietary goals.   Admit Weight --  On prednisone currently. Roberta reports she needs to lose 10 lbs for her potential [facility]   Goal Weight: Short Term 140 lb (63.504 kg)   Goal Weight: Long Term 125 lb (56.7 kg)   Expected Outcomes Short Term: Continue to assess and modify interventions until short term weight is achieved;Weight Maintenance: Understanding of the daily nutrition guidelines, which includes 25-35% calories from fat, 7% or less cal from saturated fats, less than 200mg  cholesterol, less than 1.5gm of sodium, & 5 or more servings of fruits and vegetables daily;Weight Loss: Understanding of general recommendations for a balanced deficit meal plan, which promotes 1-2 lb weight loss per week and includes a negative energy balance of [phone] kcal/d;Understanding recommendations for meals to include 15-35% energy as protein, 25-35% energy from fat, 35-60% energy from carbohydrates, less than 200mg  of dietary cholesterol, 20-35 gm of total fiber daily;Weight Gain: Understanding of general recommendations for a high calorie, high protein meal plan that promotes weight gain by distributing calorie intake throughout the day with the consumption for 4-5 meals, snacks, and/or supplements   Sedentary Yes   Intervention Provide advice, education, support and counseling about physical activity/exercise needs.;Develop an individualized exercise prescription for aerobic and resistive training based on initial evaluation findings, risk stratification, comorbidities and participant's personal goals.   Expected Outcomes Achievement of increased cardiorespiratory  fitness and enhanced flexibility, muscular endurance and strength shown through measurements of functional capacity and personal statement of participant.   Intervention Provide advice, education, support and counseling about physical activity/exercise needs.;Develop an individualized exercise prescription for aerobic and resistive training based on initial evaluation findings, risk stratification, comorbidities and participant's personal goals.   Expected Outcomes Achievement of increased cardiorespiratory fitness and enhanced flexibility, muscular endurance and strength shown through measurements of functional capacity and personal statement of participant.   Stress Yes   Intervention Offer individual and/or small group education and counseling on adjustment to heart disease, stress management and health-related lifestyle change. Teach and support self-help strategies.;Refer participants experiencing significant psychosocial distress to appropriate mental health specialists for further evaluation and treatment. When possible, include family members and significant others in education/counseling sessions.   Expected Outcomes Short Term: Participant demonstrates changes in health-related behavior, relaxation and other stress management skills, ability to obtain effective social support, and compliance with psychotropic medications if prescribed.;Long Term: Emotional wellbeing is indicated by absence of clinically significant psychosocial distress or social isolation.      Core Components/Risk Factors/Patient Goals Review:    Core Components/Risk Factors/Patient Goals at Discharge (Final Review):    ITP Comments:     ITP Comments      12/14/15 1215 12/14/15 1216         ITP Comments taking xanax when she needs it. Blakelyn reports it is  stressful about the possibility of a lung transplant and all the workup.   Pulse ox down to 82% at end of 6 minute walk even on 8 liters of supplemental oxygen.  Pulse ox came up to 92% with rest.          Comments:  Pulse ox down to 82% at end of 6 minute walk even on 8 liters of supplemental oxygen. Pulse ox came up to 92% with rest.

## 2015-12-17 NOTE — Patient Instructions (Signed)
Patient Instructions  Patient Details  Name: Erika Martinez MRN: [id number] Date of Birth: 1956/03/22 Referring Provider:  Juanito Doom, MD  Below are the personal goals you chose as well as exercise and nutrition goals. Our goal is to help you keep on track towards obtaining and maintaining your goals. We will be discussing your progress on these goals with you throughout the program.  Initial Exercise Prescription:     Initial Exercise Prescription - 12/14/15 1400    Date of Initial Exercise RX and Referring Provider   Date 12/14/15   Referring Provider McQuaid   Oxygen   Oxygen Continuous   Liters 4   Treadmill   MPH 1.4   Grade 1   Minutes 15   METs 2.26   NuStep   Level 2   Minutes 15   METs 2.3   REL-XR   Level 1   Minutes 15   METs 2.3   Prescription Details   Frequency (times per week) 3   Duration Progress to 30 minutes of continuous aerobic without signs/symptoms of physical distress   Intensity   THRR 40-80% of Max Heartrate 111-142   Ratings of Perceived Exertion 11-13   Perceived Dyspnea 0-4   Progression   Progression Continue to progress workloads to maintain intensity without signs/symptoms of physical distress.   Resistance Training   Training Prescription Yes   Weight 2   Reps 10-15      Exercise Goals: Frequency: Be able to perform aerobic exercise three times per week working toward 3-5 days per week.  Intensity: Work with a perceived exertion of 11 (fairly light) - 15 (hard) as tolerated. Follow your new exercise prescription and watch for changes in prescription as you progress with the program. Changes will be reviewed with you when they are made.  Duration: You should be able to do 30 minutes of continuous aerobic exercise in addition to a 5 minute warm-up and a 5 minute cool-down routine.  Nutrition Goals: Your personal nutrition goals will be established when you do your nutrition analysis with the dietician.  The following are  nutrition guidelines to follow: Cholesterol < 200mg /day Sodium < 1500mg /day Fiber: Women over 50 yrs - 21 grams per day  Personal Goals:     Personal Goals and Risk Factors at Admission - 12/14/15 1209    Core Components/Risk Factors/Patient Goals on Admission    Weight Management Yes   Intervention Weight Management: Develop a combined nutrition and exercise program designed to reach desired caloric intake, while maintaining appropriate intake of nutrient and fiber, sodium and fats, and appropriate energy expenditure required for the weight goal.;Weight Management: Provide education and appropriate resources to help participant work on and attain dietary goals.;Weight Management/Obesity: Establish reasonable short term and long term weight goals.;Obesity: Provide education and appropriate resources to help participant work on and attain dietary goals.   Admit Weight --  On prednisone currently. Cianne reports she needs to lose 10 lbs for her potential [facility]   Goal Weight: Short Term 140 lb (63.504 kg)   Goal Weight: Long Term 125 lb (56.7 kg)   Expected Outcomes Short Term: Continue to assess and modify interventions until short term weight is achieved;Weight Maintenance: Understanding of the daily nutrition guidelines, which includes 25-35% calories from fat, 7% or less cal from saturated fats, less than 200mg  cholesterol, less than 1.5gm of sodium, & 5 or more servings of fruits and vegetables daily;Weight Loss: Understanding of general recommendations for a balanced  deficit meal plan, which promotes 1-2 lb weight loss per week and includes a negative energy balance of [phone] kcal/d;Understanding recommendations for meals to include 15-35% energy as protein, 25-35% energy from fat, 35-60% energy from carbohydrates, less than 200mg  of dietary cholesterol, 20-35 gm of total fiber daily;Weight Gain: Understanding of general recommendations for a high calorie, high protein meal plan  that promotes weight gain by distributing calorie intake throughout the day with the consumption for 4-5 meals, snacks, and/or supplements   Sedentary Yes   Intervention Provide advice, education, support and counseling about physical activity/exercise needs.;Develop an individualized exercise prescription for aerobic and resistive training based on initial evaluation findings, risk stratification, comorbidities and participant's personal goals.   Expected Outcomes Achievement of increased cardiorespiratory fitness and enhanced flexibility, muscular endurance and strength shown through measurements of functional capacity and personal statement of participant.   Intervention Provide advice, education, support and counseling about physical activity/exercise needs.;Develop an individualized exercise prescription for aerobic and resistive training based on initial evaluation findings, risk stratification, comorbidities and participant's personal goals.   Expected Outcomes Achievement of increased cardiorespiratory fitness and enhanced flexibility, muscular endurance and strength shown through measurements of functional capacity and personal statement of participant.   Stress Yes   Intervention Offer individual and/or small group education and counseling on adjustment to heart disease, stress management and health-related lifestyle change. Teach and support self-help strategies.;Refer participants experiencing significant psychosocial distress to appropriate mental health specialists for further evaluation and treatment. When possible, include family members and significant others in education/counseling sessions.   Expected Outcomes Short Term: Participant demonstrates changes in health-related behavior, relaxation and other stress management skills, ability to obtain effective social support, and compliance with psychotropic medications if prescribed.;Long Term: Emotional wellbeing is indicated by absence of  clinically significant psychosocial distress or social isolation.      Tobacco Use Initial Evaluation: History  Smoking status   Never Smoker   Smokeless tobacco   Never Used    Copy of goals given to participant.

## 2015-12-17 NOTE — Progress Notes (Signed)
Pulmonary Individual Treatment Plan  Patient Details  Name: Erika Martinez MRN: [id number] Date of Birth: 1955-09-02 Referring Provider:        Pulmonary Rehab from 12/14/2015 in [facility] Cardiac and Pulmonary Rehab   Referring Provider  McQuaid      Initial Encounter Date:       Pulmonary Rehab from 12/14/2015 in [facility] Cardiac and Pulmonary Rehab   Date  12/14/15   Referring Provider  McQuaid      Visit Diagnosis: Pulmonary fibrosis ([place])  Patient's Home Medications on Admission:  Current outpatient prescriptions:    ADVAIR DISKUS 500-50 MCG/DOSE AEPB, INHALE 1 PUFF INTO THE LUNGS 2 (TWO) TIMES DAILY., Disp: 60 each, Rfl: 5   albuterol (PROVENTIL HFA;VENTOLIN HFA) 108 (90 Base) MCG/ACT inhaler, Inhale 2 puffs into the lungs every 4 (four) hours as needed for wheezing or shortness of breath., Disp: 8 g, Rfl: 5   albuterol (PROVENTIL) (2.5 MG/3ML) 0.083% nebulizer solution, Take 3 mLs (2.5 mg total) by nebulization every 4 (four) hours as needed for wheezing or shortness of breath. DX Code: J84.10, Disp: 75 mL, Rfl: 12   ALPRAZolam (XANAX) 0.5 MG tablet, TAKE 1 TABLET BY MOUTH 3 TIMES A DAY AS NEEDED FOR ANXIETY, Disp: 45 tablet, Rfl: 2   azaTHIOprine (IMURAN) 50 MG tablet, Take 2 tablets (100 mg total) by mouth daily. Daily, Disp: 60 tablet, Rfl: 5   cholecalciferol (VITAMIN D) 1000 UNITS tablet, Take 2,000 Units by mouth daily., Disp: , Rfl:    diltiazem (CARDIZEM) 60 MG tablet, TAKE 1 TABLET (60 MG TOTAL) BY MOUTH DAILY., Disp: 30 tablet, Rfl: 2   DYMISTA 137-50 MCG/ACT SUSP, 2 sprays in the nose daily, Disp: 23 g, Rfl: 12   fluconazole (DIFLUCAN) 150 MG tablet, Take 150 mg by mouth daily., Disp: , Rfl:    loratadine (CLARITIN) 10 MG tablet, Take 1 tablet (10 mg total) by mouth 2 (two) times daily., Disp: 60 tablet, Rfl: 3   nystatin (MYCOSTATIN) 100000 UNIT/ML suspension, TAKE 1 TABLESPOON BY MOUTH BEFORE MEALS AND AT BEDTIME (SWISH AND SPIT OUT) (Patient taking differently:  TAKE 1 TABLESPOON BY MOUTH BEFORE MEALS AND AT BEDTIME (SWISH AND SPIT OUT) as needed), Disp: 473 mL, Rfl: 2   omeprazole (PRILOSEC) 40 MG capsule, TAKE 1 CAPSULE TWICE DAILY, Disp: 60 capsule, Rfl: 2   predniSONE (DELTASONE) 10 MG tablet, Take 1 tablet (10 mg total) by mouth daily with breakfast., Disp: 30 tablet, Rfl: 2   predniSONE (DELTASONE) 10 MG tablet, TAKE 1.5 (ONE AND ONE-HALF) TABLETS (15 MG TOTAL) BY MOUTH DAILY WITH BREAKFAST., Disp: 45 tablet, Rfl: 2   sulfamethoxazole-trimethoprim (BACTRIM DS,SEPTRA DS) 800-160 MG tablet, TAKE 1 TABLET BY MOUTH 3 (THREE) TIMES A WEEK., Disp: , Rfl: 5   VENTOLIN HFA 108 (90 Base) MCG/ACT inhaler, INHALE 2 PUFFS INTO THE LUNGS EVERY 4 (FOUR) HOURS AS NEEDED FOR WHEEZING OR SHORTNESS OF BREATH., Disp: 18 g, Rfl: 5   vitamin B-12 (CYANOCOBALAMIN) 250 MCG tablet, Place 250 mcg under the tongue every other day. , Disp: , Rfl:   Past Medical History: Past Medical History  Diagnosis Date   Asthma    Mitral valve prolapse    Seasonal allergies    Hypertension    Abnormal hemoglobin (HCC)    Plantar wart    Symptomatic menopausal or female climacteric states    Scleroderma (HCC)     Dr. Pennie Banter   Pulmonary fibrosis [facility])    Allergy    Oxygen dependent  Erythrocytosis    Hep C w/o coma, chronic (HCC)    Lumbago    Anxiety    GERD (gastroesophageal reflux disease)    IBS (irritable bowel syndrome)    Controlled insomnia    Spondylolisthesis     Tobacco Use: History  Smoking status   Never Smoker   Smokeless tobacco   Never Used    Labs: Recent Review Flowsheet Data    Labs for ITP Cardiac and Pulmonary Rehab Latest Ref Rng 07/14/2013 07/05/2015 08/02/2015   Cholestrol 100 - 199 mg/dL 169 - 196   LDLCALC 0 - 99 mg/dL 95 - 112(H)   HDL >39 mg/dL 58 - 68   Trlycerides 0 - 149 mg/dL 78 - 78   Hemoglobin A1c 4.8 - 5.6 % - 6.3(H) -       ADL UCSD:     Pulmonary Assessment Scores      12/14/15 1350        ADL UCSD   ADL Phase Entry     SOB Score total 43     Rest 0     Walk 2     Stairs 4     Bath 1     Dress 1     Shop 2        Pulmonary Function Assessment:   Exercise Target Goals: Date: 12/14/15  Exercise Program Goal: Individual exercise prescription set with THRR, safety & activity barriers. Participant demonstrates ability to understand and report RPE using BORG scale, to self-measure pulse accurately, and to acknowledge the importance of the exercise prescription.  Exercise Prescription Goal: Starting with aerobic activity 30 plus minutes a day, 3 days per week for initial exercise prescription. Provide home exercise prescription and guidelines that participant acknowledges understanding prior to discharge.  Activity Barriers & Risk Stratification:     Activity Barriers & Cardiac Risk Stratification - 12/14/15 1204    Activity Barriers & Cardiac Risk Stratification   Activity Barriers Deconditioning;Joint Problems      6 Minute Walk:     6 Minute Walk      12/14/15 1204 12/14/15 1216 12/14/15 1301   6 Minute Walk   Phase Initial     Distance 810 feet     Walk Time 5.81 minutes     # of Rest Breaks 1     RPE 15     Perceived Dyspnea  3  "tired " on 8 liters oxgyen for 6 minute walk --   Pulse ox down to 82% at end of 6 minute walk even on 8 liters of supplemental oxygen. Pulse ox came up to 92% with rest.  3   Resting HR 80 bpm     Resting BP 122/70 mmHg     Max Ex. HR 118 bpm     Max Ex. BP 138/70 mmHg       12/14/15 1302 12/14/15 1312     6 Minute Walk   Phase Initial Initial       Initial Exercise Prescription:     Initial Exercise Prescription - 12/14/15 1400    Date of Initial Exercise RX and Referring Provider   Date 12/14/15   Referring Provider McQuaid   Oxygen   Oxygen Continuous   Liters 4   Treadmill   MPH 1.4   Grade 1   Minutes 15   METs 2.26   NuStep   Level 2   Minutes 15   METs 2.3   REL-XR   Level 1  Minutes 15    METs 2.3   Prescription Details   Frequency (times per week) 3   Duration Progress to 30 minutes of continuous aerobic without signs/symptoms of physical distress   Intensity   THRR 40-80% of Max Heartrate 111-142   Ratings of Perceived Exertion 11-13   Perceived Dyspnea 0-4   Progression   Progression Continue to progress workloads to maintain intensity without signs/symptoms of physical distress.   Resistance Training   Training Prescription Yes   Weight 2   Reps 10-15      Perform Capillary Blood Glucose checks as needed.  Exercise Prescription Changes:   Exercise Comments:     Exercise Comments      12/14/15 1217           Exercise Comments  Pulse ox down to 82% at end of 6 minute walk even on 8 liters of supplemental oxygen. Pulse ox came up to 92% with rest.           Discharge Exercise Prescription (Final Exercise Prescription Changes):    Nutrition:  Target Goals: Understanding of nutrition guidelines, daily intake of sodium 1500mg , cholesterol 200mg , calories 30% from fat and 7% or less from saturated fats, daily to have 5 or more servings of fruits and vegetables.  Biometrics:     Pre Biometrics - 12/14/15 1321    Pre Biometrics   Height 5' 1.2" (1.554 m)   Weight 151 lb 11.2 oz (68.811 kg)   Waist Circumference 42 inches   Hip Circumference 39.8 inches   Waist to Hip Ratio 1.06 %   BMI (Calculated) 28.5   Single Leg Stand 30 seconds       Nutrition Therapy Plan and Nutrition Goals:     Nutrition Therapy & Goals - 12/14/15 1226    Nutrition Therapy   Diet --  Deb wants to meet with the Registered Dietician to help her lose weight. Deb has cut out bread and sodas.       Nutrition Discharge: Rate Your Plate Scores:   Psychosocial: Target Goals: Acknowledge presence or absence of depression, maximize coping skills, provide positive support system. Participant is able to verbalize types and ability to use techniques and skills needed  for reducing stress and depression.  Initial Review & Psychosocial Screening:     Initial Psych Review & Screening - 12/14/15 [place]? Yes      Quality of Life Scores:     Quality of Life - 12/14/15 1323    Quality of Life Scores   Health/Function Pre 20.69 %   Socioeconomic Pre 20.38 %   Psych/Spiritual Pre 21 %   Family Pre 20 %   GLOBAL Pre 20.58 %      PHQ-9:     Recent Review Flowsheet Data    Depression screen [facility] 2/9 12/14/2015 10/03/2015 07/03/2015 04/06/2015 03/14/2015   Decreased Interest 1 0 0 0 0   Down, Depressed, Hopeless 1 0 0 0 0   PHQ - 2 Score 2 0 0 0 0   Altered sleeping 1 - - - -   Tired, decreased energy 2 - - - -   Change in appetite 2 - - - -   Feeling bad or failure about yourself  1 - - - -   Trouble concentrating 0 - - - -   Moving slowly or fidgety/restless 0 - - - -   Suicidal thoughts 0 - - - -  PHQ-9 Score 8 - - - -   Difficult doing work/chores Somewhat difficult - - - -      Psychosocial Evaluation and Intervention:   Psychosocial Re-Evaluation:  Education: Education Goals: Education classes will be provided on a weekly basis, covering required topics. Participant will state understanding/return demonstration of topics presented.  Learning Barriers/Preferences:     Learning Barriers/Preferences - 12/14/15 1204    Learning Barriers/Preferences   Learning Barriers Sight  wears reading glasses      Education Topics: Initial Evaluation Education: - Verbal, written and demonstration of respiratory meds, RPE/PD scales, oximetry and breathing techniques. Instruction on use of nebulizers and MDIs: cleaning and proper use, rinsing mouth with steroid doses and importance of monitoring MDI activations.          Documentation from 03/14/2015 in [place]   Date  12/05/14   Educator  LB   Instruction Review Code  2- meets goals/outcomes      General  Nutrition Guidelines/Fats and Fiber: -Group instruction provided by verbal, written material, models and posters to present the general guidelines for heart healthy nutrition. Gives an explanation and review of dietary fats and fiber.      Documentation from 03/14/2015 in [place]   Date  01/22/15 [facility] exercise plan]   Educator  C. Joneen Caraway, RD   Instruction Review Code  2- meets goals/outcomes      Controlling Sodium/Reading Food Labels: -Group verbal and written material supporting the discussion of sodium use in heart healthy nutrition. Review and explanation with models, verbal and written materials for utilization of the food label.      Documentation from 03/14/2015 in [place]   Date  01/29/15   Educator  C. Joneen Caraway, RD   Instruction Review Code  2- meets goals/outcomes      Exercise Physiology & Risk Factors: - Group verbal and written instruction with models to review the exercise physiology of the cardiovascular system and associated critical values. Details cardiovascular disease risk factors and the goals associated with each risk factor.   Aerobic Exercise & Resistance Training: - Gives group verbal and written discussion on the health impact of inactivity. On the components of aerobic and resistive training programs and the benefits of this training and how to safely progress through these programs.   Flexibility, Balance, General Exercise Guidelines: - Provides group verbal and written instruction on the benefits of flexibility and balance training programs. Provides general exercise guidelines with specific guidelines to those with heart or lung disease. Demonstration and skill practice provided.   Stress Management: - Provides group verbal and written instruction about the health risks of elevated stress, cause of high stress, and healthy ways to reduce stress.      Documentation from  03/14/2015 in [place]   Date  01/03/15   Educator  [facility]   Instruction Review Code  2- meets goals/outcomes      Depression: - Provides group verbal and written instruction on the correlation between heart/lung disease and depressed mood, treatment options, and the stigmas associated with seeking treatment.   Exercise & Equipment Safety: - Individual verbal instruction and demonstration of equipment use and safety with use of the equipment.      Pulmonary Rehab from 12/14/2015 in [facility] Cardiac and Pulmonary Rehab   Date  12/14/15   Educator  C. [place]   Instruction Review Code  1- partially meets, needs  review/practice      Infection Prevention: - Provides verbal and written material to individual with discussion of infection control including proper hand washing and proper equipment cleaning during exercise session.      Pulmonary Rehab from 12/14/2015 in [facility] Cardiac and Pulmonary Rehab   Date  12/14/15   Educator  C. EnterkinRN   Instruction Review Code  2- meets goals/outcomes      Falls Prevention: - Provides verbal and written material to individual with discussion of falls prevention and safety.      Pulmonary Rehab from 12/14/2015 in [facility] Cardiac and Pulmonary Rehab   Date  12/14/15   Educator  C. [place]   Instruction Review Code  2- meets goals/outcomes      Diabetes: - Individual verbal and written instruction to review signs/symptoms of diabetes, desired ranges of glucose level fasting, after meals and with exercise. Advice that pre and post exercise glucose checks will be done for 3 sessions at entry of program.   Chronic Lung Diseases: - Group verbal and written instruction to review new updates, new respiratory medications, new advancements in procedures and treatments. Provide informative websites and "800" numbers of self-education.   Lung Procedures: - Group verbal and written instruction to describe testing methods  done to diagnose lung disease. Review the outcome of test results. Describe the treatment choices: Pulmonary Function Tests, ABGs and oximetry.      Documentation from 03/14/2015 in [place]   Date  01/12/15   Educator  sj   Instruction Review Code  2- meets goals/outcomes      Energy Conservation: - Provide group verbal and written instruction for methods to conserve energy, plan and organize activities. Instruct on pacing techniques, use of adaptive equipment and posture/positioning to relieve shortness of breath.      Documentation from 03/14/2015 in [place]   Date  01/24/15   Educator  SW   Instruction Review Code  2- meets goals/outcomes      Triggers: - Group verbal and written instruction to review types of environmental controls: home humidity, furnaces, filters, dust mite/pet prevention, HEPA vacuums. To discuss weather changes, air quality and the benefits of nasal washing.      Documentation from 03/14/2015 in [place]   Date  12/18/14   Educator  LB   Instruction Review Code  2- meets goals/outcomes      Exacerbations: - Group verbal and written instruction to provide: warning signs, infection symptoms, calling MD promptly, preventive modes, and value of vaccinations. Review: effective airway clearance, coughing and/or vibration techniques. Create an Sports administrator.   Oxygen: - Individual and group verbal and written instruction on oxygen therapy. Includes supplement oxygen, available portable oxygen systems, continuous and intermittent flow rates, oxygen safety, concentrators, and Medicare reimbursement for oxygen.      Documentation from 03/14/2015 in [place]   Date  12/05/14   Educator  LB   Instruction Review Code  2- meets goals/outcomes      Respiratory Medications: - Group verbal and written instruction to  review medications for lung disease. Drug class, frequency, complications, importance of spacers, rinsing mouth after steroid MDI's, and proper cleaning methods for nebulizers.      Documentation from 03/14/2015 in [place]   Date  12/22/14   Educator  [place]   Instruction Review Code  2- meets goals/outcomes [Spacer and instructions given  to pt. ]      AED/CPR: - Group verbal and written instruction with the use of models to demonstrate the basic use of the AED with the basic ABC's of resuscitation.      Documentation from 03/14/2015 in [place]   Date  01/19/15   Educator  CE   Instruction Review Code  2- meets goals/outcomes      Breathing Retraining: - Provides individuals verbal and written instruction on purpose, frequency, and proper technique of diaphragmatic breathing and pursed-lipped breathing. Applies individual practice skills.      Documentation from 03/14/2015 in [place]   Date  12/05/14   Educator  LB   Instruction Review Code  2- meets goals/outcomes      Anatomy and Physiology of the Lungs: - Group verbal and written instruction with the use of models to provide basic lung anatomy and physiology related to function, structure and complications of lung disease.      Documentation from 03/14/2015 in [place]   Date  02/23/15   Educator  C. Enterkin, RN   Instruction Review Code  2- meets goals/outcomes      Heart Failure: - Group verbal and written instruction on the basics of heart failure: signs/symptoms, treatments, explanation of ejection fraction, enlarged heart and cardiomyopathy.      Documentation from 03/14/2015 in [place]   Date  12/29/14   Educator  [place]   Instruction Review Code  2- meets goals/outcomes      Sleep Apnea: - Individual verbal and written  instruction to review Obstructive Sleep Apnea. Review of risk factors, methods for diagnosing and types of masks and machines for OSA.   Anxiety: - Provides group, verbal and written instruction on the correlation between heart/lung disease and anxiety, treatment options, and management of anxiety.      Documentation from 03/14/2015 in [place]   Date  01/31/15   Educator  [facility]   Instruction Review Code  2- Meets goals/outcomes      Relaxation: - Provides group, verbal and written instruction about the benefits of relaxation for patients with heart/lung disease. Also provides patients with examples of relaxation techniques.   Knowledge Questionnaire Score:     Knowledge Questionnaire Score - 12/14/15 1324    Knowledge Questionnaire Score   Pre Score 9       Core Components/Risk Factors/Patient Goals at Admission:     Personal Goals and Risk Factors at Admission - 12/14/15 1209    Core Components/Risk Factors/Patient Goals on Admission    Weight Management Yes   Intervention Weight Management: Develop a combined nutrition and exercise program designed to reach desired caloric intake, while maintaining appropriate intake of nutrient and fiber, sodium and fats, and appropriate energy expenditure required for the weight goal.;Weight Management: Provide education and appropriate resources to help participant work on and attain dietary goals.;Weight Management/Obesity: Establish reasonable short term and long term weight goals.;Obesity: Provide education and appropriate resources to help participant work on and attain dietary goals.   Admit Weight --  On prednisone currently. Latonja reports she needs to lose 10 lbs for her potential [facility]   Goal Weight: Short Term 140 lb (63.504 kg)   Goal Weight: Long Term 125 lb (56.7 kg)   Expected Outcomes Short Term: Continue to assess and modify interventions until short term weight is  achieved;Weight Maintenance: Understanding  of the daily nutrition guidelines, which includes 25-35% calories from fat, 7% or less cal from saturated fats, less than 200mg  cholesterol, less than 1.5gm of sodium, & 5 or more servings of fruits and vegetables daily;Weight Loss: Understanding of general recommendations for a balanced deficit meal plan, which promotes 1-2 lb weight loss per week and includes a negative energy balance of [phone] kcal/d;Understanding recommendations for meals to include 15-35% energy as protein, 25-35% energy from fat, 35-60% energy from carbohydrates, less than 200mg  of dietary cholesterol, 20-35 gm of total fiber daily;Weight Gain: Understanding of general recommendations for a high calorie, high protein meal plan that promotes weight gain by distributing calorie intake throughout the day with the consumption for 4-5 meals, snacks, and/or supplements   Sedentary Yes   Intervention Provide advice, education, support and counseling about physical activity/exercise needs.;Develop an individualized exercise prescription for aerobic and resistive training based on initial evaluation findings, risk stratification, comorbidities and participant's personal goals.   Expected Outcomes Achievement of increased cardiorespiratory fitness and enhanced flexibility, muscular endurance and strength shown through measurements of functional capacity and personal statement of participant.   Intervention Provide advice, education, support and counseling about physical activity/exercise needs.;Develop an individualized exercise prescription for aerobic and resistive training based on initial evaluation findings, risk stratification, comorbidities and participant's personal goals.   Expected Outcomes Achievement of increased cardiorespiratory fitness and enhanced flexibility, muscular endurance and strength shown through measurements of functional capacity and personal statement of participant.   Stress  Yes   Intervention Offer individual and/or small group education and counseling on adjustment to heart disease, stress management and health-related lifestyle change. Teach and support self-help strategies.;Refer participants experiencing significant psychosocial distress to appropriate mental health specialists for further evaluation and treatment. When possible, include family members and significant others in education/counseling sessions.   Expected Outcomes Short Term: Participant demonstrates changes in health-related behavior, relaxation and other stress management skills, ability to obtain effective social support, and compliance with psychotropic medications if prescribed.;Long Term: Emotional wellbeing is indicated by absence of clinically significant psychosocial distress or social isolation.      Core Components/Risk Factors/Patient Goals Review:    Core Components/Risk Factors/Patient Goals at Discharge (Final Review):    ITP Comments:     ITP Comments      12/14/15 1215 12/14/15 1216 12/14/15 1221 12/14/15 1226     ITP Comments taking xanax when she needs it. Kestrel reports it is stressful about the possibility of a lung transplant and all the workup.   Pulse ox down to 82% at end of 6 minute walk even on 8 liters of supplemental oxygen. Pulse ox came up to 92% with rest.  Right femerol area is sore Deb reports from her heart cath last week and her right groin area and leg are bruised. Deb reports her right shoulder hurts from the position she had to be for her ultrasound of her liver at [facility] (being evaluated for a lung transplant).  Deb wants to meet with the Registered Dietician to help her lose weight. Deb has cut out bread and sodas.       Comments: Ms Whicker plans to start [place] on 12/19/2015 and will attend 3 days/week.

## 2015-12-18 ENCOUNTER — Other Ambulatory Visit: Payer: Self-pay | Admitting: Family Medicine

## 2015-12-18 ENCOUNTER — Telehealth (HOSPITAL_COMMUNITY): Payer: Self-pay

## 2015-12-19 ENCOUNTER — Encounter: Payer: 59 | Admitting: *Deleted

## 2015-12-19 DIAGNOSIS — J8410 Pulmonary fibrosis, unspecified: Secondary | ICD-10-CM

## 2015-12-19 NOTE — Progress Notes (Signed)
Daily Session Note  Patient Details  Name: Erika Martinez MRN: [id number] Date of Birth: [birth year] Referring Provider:        Pulmonary Rehab from 12/14/2015 in [facility] Cardiac and Pulmonary Rehab   Referring Provider  McQuaid      Encounter Date: 12/19/2015  Check In:     Session Check In - 12/19/15 1206    Check-In   Location [facility]-Cardiac & Pulmonary Rehab   Staff Present Carson Myrtle, BS, RRT, Respiratory Lennie Hummer, MA, ACSM RCEP, Exercise Physiologist;Donni Oglesby Joya Gaskins, RN, BSN   Physician(s) Edd Fabian and Jimmye Norman   Medication changes reported     No   Fall or balance concerns reported    No   Warm-up and Cool-down Performed on first and last piece of equipment   Resistance Training Performed Yes   VAD Patient? No   Pain Assessment   Currently in Pain? No/denies         Goals Met:  Proper associated with RPD/PD & O2 Sat Exercise tolerated well Strength training completed today  Goals Unmet:  Not Applicable  Comments:  First full day of exercise!  Patient was oriented to gym and equipment including functions, settings, policies, and procedures.  Patient's individual exercise prescription and treatment plan were reviewed.  All starting workloads were established based on the results of the 6 minute walk test done at initial orientation visit.  The plan for exercise progression was also introduced and progression will be customized based on patient's performance and goals.     Dr. Emily Filbert is Medical Director for [place] and [name].

## 2015-12-24 ENCOUNTER — Encounter: Payer: 59 | Admitting: *Deleted

## 2015-12-24 DIAGNOSIS — J8410 Pulmonary fibrosis, unspecified: Secondary | ICD-10-CM

## 2015-12-24 NOTE — Progress Notes (Signed)
Daily Session Note  Patient Details  Name: DAYANA DALPORTO MRN: [id number] Date of Birth: [birth year] Referring Provider:        Pulmonary Rehab from 12/14/2015 in [facility] Cardiac and Pulmonary Rehab   Referring Provider  McQuaid      Encounter Date: 12/24/2015  Check In:     Session Check In - 12/24/15 1352    Check-In   Location [facility]-Cardiac & Pulmonary Rehab   Staff Present Alberteen Sam, MA, ACSM RCEP, Exercise Physiologist;Kelly Amedeo Plenty, BS, ACSM CEP, Exercise Physiologist;Laureen Janell Quiet, RRT, Respiratory Therapist   Supervising physician immediately available to respond to emergencies [name] immediately available ER MD   Physician(s) Joni Fears and Corky Downs   Medication changes reported     No   Fall or balance concerns reported    No   Warm-up and Cool-down Performed on first and last piece of equipment   Resistance Training Performed Yes   VAD Patient? No   Pain Assessment   Currently in Pain? No/denies   Multiple Pain Sites No         Goals Met:  Proper associated with RPD/PD & O2 Sat Independence with exercise equipment Using PLB without cueing & demonstrates good technique Exercise tolerated well Strength training completed today  Goals Unmet:  Not Applicable  Comments: Pt able to follow exercise prescription today without complaint.  Will continue to monitor for progression.    Dr. Emily Filbert is Medical Director for [place] and [name].

## 2015-12-25 ENCOUNTER — Telehealth: Payer: Self-pay | Admitting: Pulmonary Disease

## 2015-12-25 ENCOUNTER — Other Ambulatory Visit: Payer: Self-pay | Admitting: Family Medicine

## 2015-12-25 NOTE — Telephone Encounter (Signed)
Now the patient is calling about the oxygen stating that she was supposed to have multiple devices [phone]

## 2015-12-25 NOTE — Telephone Encounter (Signed)
We do not do PA's on oxygen. I attempted to call pt's insurance company but was on a 15+ minute hold.  I have spoken with the pt. She is wanting to have E10 tanks prescribed for her. Pt is currently using a POC while at work but wants the E10 tanks to exercise with. She has been told that her insurance will not cover both of these services.  BQ - please advise.  Thanks.

## 2015-12-26 ENCOUNTER — Other Ambulatory Visit: Payer: Self-pay | Admitting: Family Medicine

## 2015-12-26 ENCOUNTER — Encounter: Payer: 59 | Admitting: *Deleted

## 2015-12-26 DIAGNOSIS — J8410 Pulmonary fibrosis, unspecified: Secondary | ICD-10-CM

## 2015-12-26 NOTE — Telephone Encounter (Signed)
Patient requesting refill. 

## 2015-12-26 NOTE — Telephone Encounter (Signed)
This is not the first time she's asked for this.  I don't know what to tell her.  If the DME has a potential solution for her request and they can suggest an order then I'm willing to help, but otherwise I'm not sure what to do.

## 2015-12-26 NOTE — Telephone Encounter (Signed)
Called [employer] and spoke with Iona Beard. He states that he is going to try to work with the pt about this matter.  I have called the pt and made her aware of this. Nothing further was needed.

## 2015-12-26 NOTE — Progress Notes (Signed)
Daily Session Note  Patient Details  Name: Erika Martinez MRN: [id number] Date of Birth: 1955/08/29 Referring Provider:        Pulmonary Rehab from 12/14/2015 in [facility] Cardiac and Pulmonary Rehab   Referring Provider  McQuaid      Encounter Date: 12/26/2015  Check In:     Session Check In - 12/26/15 1153    Check-In   Location [facility]-Cardiac & Pulmonary Rehab   Staff Present Carson Myrtle, BS, RRT, Respiratory Lennie Hummer, MA, ACSM RCEP, Exercise Physiologist;Amanda Oletta Darter, BA, ACSM CEP, Exercise Physiologist;Nanetta Wiegman Joya Gaskins, RN, BSN   Supervising physician immediately available to respond to emergencies [name] immediately available ER MD   Physician(s) Cinda Quest and [place]   Medication changes reported     No   Fall or balance concerns reported    No   Warm-up and Cool-down Performed on first and last piece of equipment   Resistance Training Performed Yes   VAD Patient? No   Pain Assessment   Currently in Pain? No/denies         Goals Met:  Proper associated with RPD/PD & O2 Sat Exercise tolerated well Strength training completed today  Goals Unmet:  Not Applicable  Comments:  Pt able to follow exercise prescription today without complaint.  Will continue to monitor for progression.    Dr. Emily Filbert is Medical Director for [place] and [name].

## 2015-12-27 NOTE — Telephone Encounter (Signed)
Patient requesting refill. 

## 2015-12-28 ENCOUNTER — Encounter: Payer: 59 | Admitting: Respiratory Therapy

## 2015-12-28 DIAGNOSIS — J8410 Pulmonary fibrosis, unspecified: Secondary | ICD-10-CM | POA: Diagnosis not present

## 2015-12-28 NOTE — Progress Notes (Signed)
Daily Session Note  Patient Details  Name: Erika Martinez MRN: [id number] Date of Birth: November 08, 1955 Referring Provider:        Pulmonary Rehab from 12/14/2015 in [facility] Cardiac and Pulmonary Rehab   Referring Provider  McQuaid      Encounter Date: 12/28/2015  Check In:     Session Check In - 12/28/15 1328    Check-In   Location [facility]-Cardiac & Pulmonary Rehab   Staff Present Frederich Cha, RRT, RCP, Respiratory Therapist;Susanne Bice, RN, BSN, CCRP;Vikas Wegmann Luan Pulling, MA, ACSM RCEP, Exercise Physiologist   Supervising physician immediately available to respond to emergencies [name] immediately available ER MD   Physician(s) Dr. Cinda Quest and Jimmye Norman   Medication changes reported     No   Fall or balance concerns reported    No   Warm-up and Cool-down Performed on first and last piece of equipment   Resistance Training Performed Yes   VAD Patient? No   Pain Assessment   Currently in Pain? No/denies   Multiple Pain Sites No         Goals Met:  Proper associated with RPD/PD & O2 Sat Independence with exercise equipment Exercise tolerated well Strength training completed today  Goals Unmet:  Not Applicable  Comments: Pt able to follow exercise prescription today without complaint.  Will continue to monitor for progression.    Dr. Emily Filbert is Medical Director for [place] and [name].

## 2015-12-31 ENCOUNTER — Encounter: Payer: 59 | Admitting: *Deleted

## 2015-12-31 DIAGNOSIS — J8410 Pulmonary fibrosis, unspecified: Secondary | ICD-10-CM

## 2015-12-31 NOTE — Progress Notes (Signed)
Daily Session Note  Patient Details  Name: Erika Martinez MRN: [id number] Date of Birth: Nov 23, 1955 Referring Provider:   April Manson Pulmonary Rehab from 12/14/2015 in [facility] Cardiac and Pulmonary Rehab  Referring Provider  McQuaid      Encounter Date: 12/31/2015  Check In:     Session Check In - 12/31/15 1245      Check-In   Location [facility]-Cardiac & Pulmonary Rehab   Staff Present Carson Myrtle, BS, RRT, Respiratory Bertis Ruddy, BS, ACSM CEP, Exercise Physiologist;Mary Kellie Shropshire, RN, BSN, MA   Supervising physician immediately available to respond to emergencies [name] immediately available ER MD   Physician(s) Artis Flock and Mc[place]ane   Medication changes reported     No   Fall or balance concerns reported    No   Warm-up and Cool-down Performed on first and last piece of equipment   Resistance Training Performed Yes   VAD Patient? No     Pain Assessment   Currently in Pain? No/denies   Multiple Pain Sites No         Goals Met:  Proper associated with RPD/PD & O2 Sat Independence with exercise equipment Exercise tolerated well Strength training completed today  Goals Unmet:  Not Applicable  Comments: Patient completed exercise prescription and all exercise goals during rehab session. The exercise was tolerated well and the patient is progressing in the program.     Dr. Emily Filbert is Medical Director for [place] and [name].

## 2016-01-02 ENCOUNTER — Ambulatory Visit (INDEPENDENT_AMBULATORY_CARE_PROVIDER_SITE_OTHER): Payer: 59 | Admitting: Family Medicine

## 2016-01-02 ENCOUNTER — Encounter: Payer: Self-pay | Admitting: Family Medicine

## 2016-01-02 VITALS — BP 130/78 | HR 108 | Temp 98.2°F | Resp 20 | Ht 61.0 in | Wt 150.4 lb

## 2016-01-02 DIAGNOSIS — J8410 Pulmonary fibrosis, unspecified: Secondary | ICD-10-CM | POA: Diagnosis not present

## 2016-01-02 DIAGNOSIS — J9611 Chronic respiratory failure with hypoxia: Secondary | ICD-10-CM | POA: Diagnosis not present

## 2016-01-02 DIAGNOSIS — J309 Allergic rhinitis, unspecified: Secondary | ICD-10-CM

## 2016-01-02 DIAGNOSIS — F320 Major depressive disorder, single episode, mild: Secondary | ICD-10-CM | POA: Diagnosis not present

## 2016-01-02 DIAGNOSIS — D899 Disorder involving the immune mechanism, unspecified: Secondary | ICD-10-CM | POA: Diagnosis not present

## 2016-01-02 DIAGNOSIS — J302 Other seasonal allergic rhinitis: Secondary | ICD-10-CM

## 2016-01-02 DIAGNOSIS — D849 Immunodeficiency, unspecified: Secondary | ICD-10-CM

## 2016-01-02 DIAGNOSIS — Z7682 Awaiting organ transplant status: Secondary | ICD-10-CM

## 2016-01-02 DIAGNOSIS — J3089 Other allergic rhinitis: Secondary | ICD-10-CM

## 2016-01-02 MED ORDER — ALPRAZOLAM 0.5 MG PO TABS: ORAL_TABLET | ORAL | 2 refills | Status: DC

## 2016-01-02 MED ORDER — FLUTICASONE PROPIONATE 50 MCG/ACT NA SUSP: 2.0000 | Freq: Every day | NASAL | 6 refills | Status: DC

## 2016-01-02 NOTE — Progress Notes (Signed)
Name: Erika Martinez   MRN: [id number]    DOB: 01-08-1956   Date:01/02/2016       Progress Note  Subjective  Chief Complaint  Chief Complaint  Patient presents with  . Medication Refill    3 month F/U  . Pulmonary Fibrosis    Patient is going to [facility] for Lung Transplant and having testing done, will have colonoscopy and Endoscopy done tomorrow at 7:30 a.m.  Marland Kitchen Depression  . Allergic Rhinitis     Patient states her nose has been stopped up in the past 2 days due to heat outside    HPI  Pulmonary fibrosis: she was diagnosed in 09/2013 secondary to scleroderma, she see pulmonologist - Dr. Lake Bells, and is on the window to get a lung transplant - waiting for further evaluation at [facility].  She was  denied last year because she was HCV positive, she has been treated with Havoni and in June titer was negative, and repeat titer negative 05/2015. She does not have liver cirrhosis. She is on oxygen 2-3 liters at rest and advised by pulmonologist to 4-6 liters during activity ( at the gym ), she is now on Imuran, and bactrin three times weekly, weaning off prednisone since HCV titers are back to normal . She is getting evaluated again for lung transplant. She will have colonoscopy and EGD tomorrow.   Major Depression: she feels tired, she takes care of herself, able to get out of the house, recently had a pedicure, frustrated because she has  to drag oxygen tank around and anxious about her future. She states that she is stressed about all the [facility] evaluation for her lung transplant. Taking Alprazolam prn only for anxiety, we will try decreasing to 30 pills per month.   AR: stable at this time, no sneezing she has intermittent rhinorrhea. She would like to go back on Flonase because Dymista is no longer covered by insurance.   HTN: taking cardizem, no recent episodes of palpitation. No chest pain   Insomnia: taking prn sleep to help her rest better. No side effects    Patient Active Problem List   Diagnosis Date Noted  . Raynaud disease 11/26/2015  . Awaiting organ transplant status 08/29/2015  . Decreased GFR 07/24/2015  . Mild major depression ([place]) 06/07/2015  . Hypertension, benign 04/06/2015  . History of hepatitis C 02/02/2015  . Insomnia 02/02/2015  . Gastro-esophageal reflux disease without esophagitis 02/02/2015  . IBS (irritable bowel syndrome) 02/02/2015  . Low serum cobalamin 02/02/2015  . Dependence on supplemental oxygen 02/02/2015  . Perennial allergic rhinitis with seasonal variation 02/02/2015  . Scleroderma ([place]) 02/02/2015  . SPL (spondylolisthesis) 02/02/2015  . Supraventricular tachycardia ([place]) 02/02/2015  . Vitamin D deficiency 02/02/2015  . Anxiety 02/02/2015  . History of pneumococcal pneumonia 09/13/2013  . Chronic hypoxemic respiratory failure ([place]) 09/13/2013  . Polycythemia, secondary 09/13/2013  . Postinflammatory pulmonary fibrosis ([place]) 09/13/2013    Past Surgical History:  Procedure Laterality Date  . ABDOMINAL HYSTERECTOMY    . VAGINAL HYSTERECTOMY      Family History  Problem Relation Age of Onset  . Lung cancer Father   . Cancer Father   . Alzheimer's disease Mother   . Breast cancer Neg Hx     Social History   Social History  . Marital status: Divorced    Spouse name: N/A  . Number of children: N/A  . Years of education: N/A   Occupational History  . Not on file.  Social History Main Topics  . Smoking status: Never Smoker  . Smokeless tobacco: Never Used  . Alcohol use 2.0 oz/week    4 Standard drinks or equivalent per week     Comment: "social drinker"  . Drug use: No  . Sexual activity: Not on file   Other Topics Concern  . Not on file   Social History Narrative  . No narrative on file     Current Outpatient Prescriptions:  .  ADVAIR DISKUS 500-50 MCG/DOSE AEPB, INHALE 1 PUFF INTO THE LUNGS 2 (TWO) TIMES DAILY., Disp: 60 each, Rfl: 5 .  albuterol (PROVENTIL) (2.5 MG/3ML) 0.083% nebulizer solution, Take  3 mLs (2.5 mg total) by nebulization every 4 (four) hours as needed for wheezing or shortness of breath. DX Code: J84.10, Disp: 75 mL, Rfl: 12 .  ALPRAZolam (XANAX) 0.5 MG tablet, TAKE 1 TABLET BY MOUTH 3 TIMES A DAY AS NEEDED FOR ANXIETY, Disp: 30 tablet, Rfl: 2 .  azaTHIOprine (IMURAN) 50 MG tablet, Take 2 tablets (100 mg total) by mouth daily. Daily, Disp: 60 tablet, Rfl: 5 .  cholecalciferol (VITAMIN D) 1000 UNITS tablet, Take 2,000 Units by mouth daily., Disp: , Rfl:  .  diltiazem (CARDIZEM) 60 MG tablet, TAKE 1 TABLET (60 MG TOTAL) BY MOUTH DAILY., Disp: 30 tablet, Rfl: 2 .  fluconazole (DIFLUCAN) 150 MG tablet, TAKE 1 TABLET WEEKLY AS NEEDED FOR THRUSH, Disp: 4 tablet, Rfl: 2 .  loratadine (CLARITIN) 10 MG tablet, TAKE 1 TABLET (10 MG TOTAL) BY MOUTH 2 (TWO) TIMES DAILY., Disp: 60 tablet, Rfl: 3 .  nystatin (MYCOSTATIN) 100000 UNIT/ML suspension, TAKE 1 TABLESPOON BY MOUTH BEFORE MEALS AND AT BEDTIME (SWISH AND SPIT OUT) (Patient taking differently: TAKE 1 TABLESPOON BY MOUTH BEFORE MEALS AND AT BEDTIME (SWISH AND SPIT OUT) as needed), Disp: 473 mL, Rfl: 2 .  omeprazole (PRILOSEC) 40 MG capsule, TAKE 1 CAPSULE TWICE DAILY, Disp: 60 capsule, Rfl: 2 .  PEG 3350-KCl-NaBcb-NaCl-NaSulf (PEG-3350/ELECTROLYTES) 236 g SOLR, Take by mouth., Disp: , Rfl:  .  predniSONE (DELTASONE) 10 MG tablet, Take 1 tablet (10 mg total) by mouth daily with breakfast., Disp: 30 tablet, Rfl: 2 .  sulfamethoxazole-trimethoprim (BACTRIM DS,SEPTRA DS) 800-160 MG tablet, TAKE 1 TABLET BY MOUTH 3 (THREE) TIMES A WEEK., Disp: , Rfl: 5 .  VENTOLIN HFA 108 (90 Base) MCG/ACT inhaler, INHALE 2 PUFFS INTO THE LUNGS EVERY 4 (FOUR) HOURS AS NEEDED FOR WHEEZING OR SHORTNESS OF BREATH., Disp: 18 g, Rfl: 5 .  vitamin B-12 (CYANOCOBALAMIN) 250 MCG tablet, Place 250 mcg under the tongue every other day. , Disp: , Rfl:   No Known Allergies   ROS  Constitutional: Negative for fever or weight change.  Respiratory: Positive for cough  and shortness of breath.   Cardiovascular: Negative for chest pain or palpitations.  Gastrointestinal: Negative for abdominal pain, no bowel changes.  Musculoskeletal: Negative for gait problem or joint swelling.  Skin: Negative for rash.  Neurological: Negative for dizziness or headache.  No other specific complaints in a complete review of systems (except as listed in HPI above).  Objective  Vitals:   01/02/16 1443  BP: 130/78  Pulse: (!) 108  Resp: 20  Temp: 98.2 F (36.8 C)  TempSrc: Oral  SpO2: 94%  Weight: 150 lb 6.4 oz (68.2 kg)  Height: 5\' 1"  (1.549 m)    Body mass index is 28.42 kg/m.  Physical Exam  Constitutional: Patient appears well-developed and well-nourished. Obese  HEENT: head atraumatic, normocephalic, pupils equal and  reactive to light, neck supple, throat within normal limits Cardiovascular: Normal rate, regular rhythm and normal heart sounds.  No murmur heard. 1 plus BLE edema. Pulmonary/Chest: Effort normal very fine end inspiratory  crackles today Abdominal: Soft.  There is no tenderness. Psychiatric: Patient has a normal mood and affect. behavior is normal. Judgment and thought content normal. Skin violaceous nail beds  Recent Results (from the past 2160 hour(s))  CBC with Differential/Platelet     Status: Abnormal   Collection Time: 10/18/15  3:24 PM  Result Value Ref Range   WBC 17.7 (H) 3.6 - 11.0 K/uL   RBC 4.50 3.80 - 5.20 MIL/uL   Hemoglobin 12.5 12.0 - 16.0 g/dL   HCT 38.9 35.0 - 47.0 %   MCV 86.6 80.0 - 100.0 fL   MCH 27.8 26.0 - 34.0 pg   MCHC 32.1 32.0 - 36.0 g/dL   RDW 14.4 11.5 - 14.5 %   Platelets 303 150 - 440 K/uL   Neutrophils Relative % 90% %   Neutro Abs 15.9 (H) 1.4 - 6.5 K/uL   Lymphocytes Relative 5% %   Lymphs Abs 0.9 (L) 1.0 - 3.6 K/uL   Monocytes Relative 5% %   Monocytes Absolute 0.8 0.2 - 0.9 K/uL   Eosinophils Relative 0% %   Eosinophils Absolute 0.0 0 - 0.7 K/uL   Basophils Relative 0% %   Basophils Absolute  0.0 0 - 0.1 K/uL  CBC with Differential/Platelet     Status: Abnormal   Collection Time: 11/06/15  3:00 PM  Result Value Ref Range   WBC 16.8 (H) 3.6 - 11.0 K/uL   RBC 4.51 3.80 - 5.20 MIL/uL   Hemoglobin 12.6 12.0 - 16.0 g/dL   HCT 38.7 35.0 - 47.0 %   MCV 85.8 80.0 - 100.0 fL   MCH 28.0 26.0 - 34.0 pg   MCHC 32.7 32.0 - 36.0 g/dL   RDW 14.6 (H) 11.5 - 14.5 %   Platelets 266 150 - 440 K/uL   Neutrophils Relative % 93% %   Neutro Abs 15.6 (H) 1.4 - 6.5 K/uL   Lymphocytes Relative 4% %   Lymphs Abs 0.7 (L) 1.0 - 3.6 K/uL   Monocytes Relative 3% %   Monocytes Absolute 0.4 0.2 - 0.9 K/uL   Eosinophils Relative 0% %   Eosinophils Absolute 0.0 0 - 0.7 K/uL   Basophils Relative 0% %   Basophils Absolute 0.1 0 - 0.1 K/uL  Hepatic function panel     Status: Abnormal   Collection Time: 11/06/15  3:00 PM  Result Value Ref Range   Total Protein 7.6 6.5 - 8.1 g/dL   Albumin 4.1 3.5 - 5.0 g/dL   AST 25 15 - 41 U/L   ALT 25 14 - 54 U/L   Alkaline Phosphatase 63 38 - 126 U/L   Total Bilirubin 0.4 0.3 - 1.2 mg/dL   Bilirubin, Direct <0.1 (L) 0.1 - 0.5 mg/dL   Indirect Bilirubin NOT CALCULATED 0.3 - 0.9 mg/dL     PHQ2/9: Depression screen [facility] 2/9 12/14/2015 10/03/2015 07/03/2015 04/06/2015 03/14/2015  Decreased Interest 1 0 0 0 0  Down, Depressed, Hopeless 1 0 0 0 0  PHQ - 2 Score 2 0 0 0 0  Altered sleeping 1 - - - -  Tired, decreased energy 2 - - - -  Change in appetite 2 - - - -  Feeling bad or failure about yourself  1 - - - -  Trouble concentrating 0 - - - -  Moving slowly or fidgety/restless 0 - - - -  Suicidal thoughts 0 - - - -  PHQ-9 Score 8 - - - -  Difficult doing work/chores Somewhat difficult - - - -     Fall Risk: Fall Risk  12/14/2015 10/03/2015 07/03/2015 110-10-2014 04/06/2015  Falls in the past year? No No No No No      Functional Status Survey: Is the patient deaf or have difficulty hearing?: No Does the patient have difficulty seeing, even when wearing  glasses/contacts?: No Does the patient have difficulty concentrating, remembering, or making decisions?: No Does the patient have difficulty walking or climbing stairs?: Yes (due to Pulmonary fibrosis) Does the patient have difficulty dressing or bathing?: No Does the patient have difficulty doing errands alone such as visiting a doctor's office or shopping?: No    Assessment & Plan  1. Pulmonary fibrosis ([place])  Continue follow up with [facility] and Dr. Lake Bells  2. Awaiting organ transplant status  She is getting depression   3. Immunosuppression (HCC)  On prednisone and Imuran   4. Mild major depression ([place])  She states she is worried and anxious about her possible lung transplant, she is not suicidal , just worried. She does not want to take SSRI at this time - ALPRAZolam (XANAX) 0.5 MG tablet; TAKE 1 TABLET BY MOUTH 3 TIMES A DAY AS NEEDED FOR ANXIETY  Dispense: 30 tablet; Refill: 2  5. Chronic hypoxemic respiratory failure (HCC)  Stable on oxygen daily

## 2016-01-07 DIAGNOSIS — J8410 Pulmonary fibrosis, unspecified: Secondary | ICD-10-CM

## 2016-01-07 NOTE — Progress Notes (Signed)
Daily Session Note  Patient Details  Name: Erika Martinez MRN: [id number] Date of Birth: [birth year] Referring Provider:   April Manson Pulmonary Rehab from 12/14/2015 in [facility] Cardiac and Pulmonary Rehab  Referring Provider  McQuaid      Encounter Date: 01/07/2016  Check In:     Session Check In - 01/07/16 1206      Check-In   Location [facility]-Cardiac & Pulmonary Rehab   Staff Present Earlean Shawl, BS, ACSM CEP, Exercise Physiologist;Francesca Strome Brayton El, DPT, CEEA;Laureen Owens Shark, BS, RRT, Respiratory Therapist   Supervising physician immediately available to respond to emergencies [name] immediately available ER MD   Physician(s) Tora Kindred and Quale   Medication changes reported     No   Fall or balance concerns reported    No   Warm-up and Cool-down Performed on first and last piece of equipment   Resistance Training Performed Yes   VAD Patient? No     Pain Assessment   Currently in Pain? No/denies         Goals Met:  Exercise tolerated well  Goals Unmet:  Not Applicable  Comments: Patient completed exercise prescription and all exercise goals during rehab session. The exercise was tolerated well and the patient is progressing in the program.    Dr. Emily Filbert is Medical Director for [place] and [name].

## 2016-01-09 ENCOUNTER — Encounter: Payer: 59 | Attending: Pulmonary Disease

## 2016-01-09 DIAGNOSIS — J8410 Pulmonary fibrosis, unspecified: Secondary | ICD-10-CM | POA: Diagnosis not present

## 2016-01-09 NOTE — Progress Notes (Signed)
Daily Session Note  Patient Details  Name: TYLIAH SCHLERETH MRN: [id number] Date of Birth: November 10, 1955 Referring Provider:   April Manson Pulmonary Rehab from 12/14/2015 in [facility] Cardiac and Pulmonary Rehab  Referring Provider  McQuaid      Encounter Date: 01/09/2016  Check In:     Session Check In - 01/09/16 1310      Check-In   Location [facility]-Cardiac & Pulmonary Rehab   Staff Present Carson Myrtle, BS, RRT, Respiratory Lennie Hummer, MA, ACSM RCEP, Exercise Physiologist;Nurah Petrides Joya Gaskins, RN, BSN   Supervising physician immediately available to respond to emergencies [name] immediately available ER MD   Physician(s) Dr. Quentin Cornwall and Dr. Marcelene Butte   Medication changes reported     No   Fall or balance concerns reported    No   Warm-up and Cool-down Performed on first and last piece of equipment   Resistance Training Performed Yes   VAD Patient? No     Pain Assessment   Currently in Pain? No/denies         Goals Met:  Proper associated with RPD/PD & O2 Sat Exercise tolerated well Strength training completed today  Goals Unmet:  Not Applicable  Comments:  Increased Peggy from 6 to 8 liters of oxygen while on XR.  Increased Lynnie to 15 liters oxygen using her own nasal cannula with a reservoir while Brithany was on the treadmill.  Joliana was able to walk at a speed of 1.2 mph with 0.5% incline on the treadmill for 10 minutes without stopping.  This is the first time Makynna has been able to do this.  Pt able to follow exercise prescription today without complaint.  Will continue to monitor for progression.  Reviewed home exercise with pt today. Pt plans to continue riding stationary bike at home for exercise. We discussed her progression with exercise at home to increase her current time. Reviewed THR, pulse, RPE, sign and symptoms, and when to call 911 or MD. Also discussed weather considerations and indoor options. Pt voiced understanding.   Dr. Emily Filbert is Medical  Director for [place] and [name].

## 2016-01-11 ENCOUNTER — Encounter: Payer: 59 | Admitting: *Deleted

## 2016-01-11 DIAGNOSIS — J8410 Pulmonary fibrosis, unspecified: Secondary | ICD-10-CM | POA: Diagnosis not present

## 2016-01-11 NOTE — Progress Notes (Signed)
Daily Session Note  Patient Details  Name: Erika Martinez MRN: [id number] Date of Birth: April 26, 1956 Referring Provider:   [facility] from 12/14/2015 in [facility]  Referring Provider  McQuaid      Encounter Date: 01/11/2016  Check In:     Session Check In - 01/11/16 1325      Check-In   Location [facility]   Staff Present Alberteen Sam, MA, ACSM RCEP, Exercise Physiologist;Amanda Oletta Darter, BA, ACSM CEP, Exercise Physiologist;Laureen Janell Quiet, RRT, Respiratory Therapist   Supervising physician immediately available to respond to emergencies [name] immediately available ER MD   Physician(s) Paduchowitz and Darl Householder   Medication changes reported     No   Fall or balance concerns reported    No   Warm-up and Cool-down Performed on first and last piece of equipment   Resistance Training Performed Yes   VAD Patient? No     Pain Assessment   Currently in Pain? No/denies   Multiple Pain Sites No         Goals Met:  Proper associated with RPD/PD & O2 Sat Independence with exercise equipment Using PLB without cueing & demonstrates good technique Exercise tolerated well Strength training completed today  Goals Unmet:  Not Applicable  Comments: Pt able to follow exercise prescription today without complaint.  Will continue to monitor for progression.    Dr. Emily Filbert is Medical Director for [place] and [name].

## 2016-01-14 ENCOUNTER — Encounter: Payer: Self-pay | Admitting: Respiratory Therapy

## 2016-01-14 DIAGNOSIS — J8410 Pulmonary fibrosis, unspecified: Secondary | ICD-10-CM | POA: Diagnosis not present

## 2016-01-14 NOTE — Progress Notes (Signed)
Pulmonary Individual Treatment Plan  Patient Details  Name: Erika Martinez MRN: [id number] Date of Birth: Jan 07, 1956 Referring Provider:   Flowsheet Row Pulmonary Rehab from 12/14/2015 in [facility] Cardiac and Pulmonary Rehab  Referring Provider  McQuaid      Initial Encounter Date:  Flowsheet Row Pulmonary Rehab from 12/14/2015 in [facility] Cardiac and Pulmonary Rehab  Date  12/14/15  Referring Provider  McQuaid      Visit Diagnosis: Pulmonary fibrosis ([place])  Patient's Home Medications on Admission:  Current Outpatient Prescriptions:    ADVAIR DISKUS 500-50 MCG/DOSE AEPB, INHALE 1 PUFF INTO THE LUNGS 2 (TWO) TIMES DAILY., Disp: 60 each, Rfl: 5   albuterol (PROVENTIL) (2.5 MG/3ML) 0.083% nebulizer solution, Take 3 mLs (2.5 mg total) by nebulization every 4 (four) hours as needed for wheezing or shortness of breath. DX Code: J84.10, Disp: 75 mL, Rfl: 12   ALPRAZolam (XANAX) 0.5 MG tablet, TAKE 1 TABLET BY MOUTH 3 TIMES A DAY AS NEEDED FOR ANXIETY, Disp: 30 tablet, Rfl: 2   azaTHIOprine (IMURAN) 50 MG tablet, Take 2 tablets (100 mg total) by mouth daily. Daily, Disp: 60 tablet, Rfl: 5   cholecalciferol (VITAMIN D) 1000 UNITS tablet, Take 2,000 Units by mouth daily., Disp: , Rfl:    diltiazem (CARDIZEM) 60 MG tablet, TAKE 1 TABLET (60 MG TOTAL) BY MOUTH DAILY., Disp: 30 tablet, Rfl: 2   fluconazole (DIFLUCAN) 150 MG tablet, TAKE 1 TABLET WEEKLY AS NEEDED FOR THRUSH, Disp: 4 tablet, Rfl: 2   fluticasone (FLONASE) 50 MCG/ACT nasal spray, Place 2 sprays into both nostrils daily., Disp: 16 g, Rfl: 6   loratadine (CLARITIN) 10 MG tablet, TAKE 1 TABLET (10 MG TOTAL) BY MOUTH 2 (TWO) TIMES DAILY., Disp: 60 tablet, Rfl: 3   nystatin (MYCOSTATIN) 100000 UNIT/ML suspension, TAKE 1 TABLESPOON BY MOUTH BEFORE MEALS AND AT BEDTIME (SWISH AND SPIT OUT) (Patient taking differently: TAKE 1 TABLESPOON BY MOUTH BEFORE MEALS AND AT BEDTIME (SWISH AND SPIT OUT) as needed), Disp: 473 mL, Rfl: 2   omeprazole  (PRILOSEC) 40 MG capsule, TAKE 1 CAPSULE TWICE DAILY, Disp: 60 capsule, Rfl: 2   PEG 3350-KCl-NaBcb-NaCl-NaSulf (PEG-3350/ELECTROLYTES) 236 g SOLR, Take by mouth., Disp: , Rfl:    predniSONE (DELTASONE) 10 MG tablet, Take 1 tablet (10 mg total) by mouth daily with breakfast., Disp: 30 tablet, Rfl: 2   sulfamethoxazole-trimethoprim (BACTRIM DS,SEPTRA DS) 800-160 MG tablet, TAKE 1 TABLET BY MOUTH 3 (THREE) TIMES A WEEK., Disp: , Rfl: 5   VENTOLIN HFA 108 (90 Base) MCG/ACT inhaler, INHALE 2 PUFFS INTO THE LUNGS EVERY 4 (FOUR) HOURS AS NEEDED FOR WHEEZING OR SHORTNESS OF BREATH., Disp: 18 g, Rfl: 5   vitamin B-12 (CYANOCOBALAMIN) 250 MCG tablet, Place 250 mcg under the tongue every other day. , Disp: , Rfl:   Past Medical History: Past Medical History:  Diagnosis Date   Abnormal hemoglobin (HCC)    Allergy    Anxiety    Asthma    Controlled insomnia    Erythrocytosis    GERD (gastroesophageal reflux disease)    Hep C w/o coma, chronic (HCC)    Hypertension    IBS (irritable bowel syndrome)    Lumbago    Mitral valve prolapse    Oxygen dependent    Plantar wart    Pulmonary fibrosis (HCC)    Scleroderma (HCC)    Dr. Pennie Banter   Seasonal allergies    Spondylolisthesis    Symptomatic menopausal or female climacteric states     Tobacco Use: History  Smoking Status   Never Smoker  Smokeless Tobacco   Never Used    Labs: Recent Review Flowsheet Data    Labs for ITP Cardiac and Pulmonary Rehab Latest Ref Rng & Units 07/14/2013 07/05/2015 08/02/2015   Cholestrol 100 - 199 mg/dL 169 - 196   LDLCALC 0 - 99 mg/dL 95 - 112(H)   HDL >39 mg/dL 58 - 68   Trlycerides 0 - 149 mg/dL 78 - 78   Hemoglobin A1c 4.8 - 5.6 % - 6.3(H) -       ADL UCSD:     Pulmonary Assessment Scores    Row Name 12/14/15 1350         ADL UCSD   ADL Phase Entry     SOB Score total 43     Rest 0     Walk 2     Stairs 4     Bath 1     Dress 1     Shop 2        Pulmonary  Function Assessment:   Exercise Target Goals:    Exercise Program Goal: Individual exercise prescription set with THRR, safety & activity barriers. Participant demonstrates ability to understand and report RPE using BORG scale, to self-measure pulse accurately, and to acknowledge the importance of the exercise prescription.  Exercise Prescription Goal: Starting with aerobic activity 30 plus minutes a day, 3 days per week for initial exercise prescription. Provide home exercise prescription and guidelines that participant acknowledges understanding prior to discharge.  Activity Barriers & Risk Stratification:     Activity Barriers & Cardiac Risk Stratification - 12/14/15 1204      Activity Barriers & Cardiac Risk Stratification   Activity Barriers Deconditioning;Joint Problems      6 Minute Walk:     6 Minute Walk    Row Name 12/14/15 1204 12/14/15 1216 12/14/15 1301     6 Minute Walk   Phase Initial  --  --   Distance 810 feet  --  --   Walk Time 5.81 minutes  --  --   # of Rest Breaks 1  --  --   RPE 15  --  --   Perceived Dyspnea  3  "tired " on 8 liters oxgyen for 6 minute walk --   Pulse ox down to 82% at end of 6 minute walk even on 8 liters of supplemental oxygen. Pulse ox came up to 92% with rest.  3   Resting HR 80 bpm  --  --   Resting BP 122/70  --  --   Max Ex. HR 118 bpm  --  --   Max Ex. BP 138/70  --  --   Row Name 12/14/15 1302 12/14/15 1312       6 Minute Walk   Phase Initial Initial       Initial Exercise Prescription:     Initial Exercise Prescription - 12/14/15 1400      Date of Initial Exercise RX and Referring Provider   Date 12/14/15   Referring Provider McQuaid     Oxygen   Oxygen Continuous   Liters 4     Treadmill   MPH 1.4   Grade 1   Minutes 15   METs 2.26     NuStep   Level 2   Minutes 15   METs 2.3     REL-XR   Level 1   Minutes 15   METs 2.3     Prescription Details  Frequency (times per week) 3   Duration  Progress to 30 minutes of continuous aerobic without signs/symptoms of physical distress     Intensity   THRR 40-80% of Max Heartrate 111-142   Ratings of Perceived Exertion 11-13   Perceived Dyspnea 0-4     Progression   Progression Continue to progress workloads to maintain intensity without signs/symptoms of physical distress.     Resistance Training   Training Prescription Yes   Weight 2   Reps 10-15      Perform Capillary Blood Glucose checks as needed.  Exercise Prescription Changes:     Exercise Prescription Changes    Row Name 12/26/15 1400 01/09/16 1300           Exercise Review   Progression Yes Yes        Response to Exercise   Blood Pressure (Admit) 110/60 118/64      Blood Pressure (Exercise) 126/64 138/64      Blood Pressure (Exit) 108/50 140/70      Heart Rate (Admit) 106 bpm 114 bpm      Heart Rate (Exercise) 115 bpm 120 bpm      Heart Rate (Exit) 104 bpm 100 bpm      Oxygen Saturation (Admit) 96 % 89 %      Oxygen Saturation (Exercise) 92 % 90 %      Oxygen Saturation (Exit) 94 % 96 %      Rating of Perceived Exertion (Exercise) 13 13      Perceived Dyspnea (Exercise) 3 3      Comments  -- Home Exercise Guidelines given 01/09/16        Progression   Progression Continue to progress workloads to maintain intensity without signs/symptoms of physical distress. Continue to progress workloads to maintain intensity without signs/symptoms of physical distress.        Resistance Training   Training Prescription Yes Yes      Weight 2 6  exercise O2 6-8L      Reps 10-15 10-15        Oxygen   Oxygen Continuous  --      Liters 4 2        Treadmill   MPH 1.2 1.2      Grade 0 0.5      Minutes 10 10      METs  -- 2        NuStep   Level  -- 2      Minutes  -- 15      METs  -- 1.5        REL-XR   Level 3 3      Minutes 15 15      METs 1.9 2.1        Biostep-RELP   Level  -- 2      Minutes  -- 15      METs  -- 2        Home Exercise Plan    Plans to continue exercise at  -- Home  Stationary bike      Frequency  -- Add 4 additional days to program exercise sessions.         Exercise Comments:     Exercise Comments    Row Name 12/14/15 1217 12/19/15 1337 01/09/16 1312 01/09/16 1356     Exercise Comments  Pulse ox down to 82% at end of 6 minute walk even on 8 liters of supplemental oxygen. Pulse ox came up to  92% with rest.  First full day of exercise!  Patient was oriented to gym and equipment including functions, settings, policies, and procedures.  Patient's individual exercise prescription and treatment plan were reviewed.  All starting workloads were established based on the results of the 6 minute walk test done at initial orientation visit.  The plan for exercise progression was also introduced and progression will be customized based on patient's performance and goals. Reviewed home exercise with pt today.  Pt plans to continue riding stationary bike at home  for exercise.  We discussed her progression with exercise at home to increase her current time. Reviewed THR, pulse, RPE, sign and symptoms, and when to call 911 or MD.  Also discussed weather considerations and indoor options.  Pt voiced understanding. Erika Martinez is continuing to improve her intensity and duration of exercise.       Discharge Exercise Prescription (Final Exercise Prescription Changes):     Exercise Prescription Changes - 01/09/16 1300      Exercise Review   Progression Yes     Response to Exercise   Blood Pressure (Admit) 118/64   Blood Pressure (Exercise) 138/64   Blood Pressure (Exit) 140/70   Heart Rate (Admit) 114 bpm   Heart Rate (Exercise) 120 bpm   Heart Rate (Exit) 100 bpm   Oxygen Saturation (Admit) 89 %   Oxygen Saturation (Exercise) 90 %   Oxygen Saturation (Exit) 96 %   Rating of Perceived Exertion (Exercise) 13   Perceived Dyspnea (Exercise) 3   Comments Home Exercise Guidelines given 01/09/16     Progression   Progression  Continue to progress workloads to maintain intensity without signs/symptoms of physical distress.     Resistance Training   Training Prescription Yes   Weight 6  exercise O2 6-8L   Reps 10-15     Oxygen   Liters 2     Treadmill   MPH 1.2   Grade 0.5   Minutes 10   METs 2     NuStep   Level 2   Minutes 15   METs 1.5     REL-XR   Level 3   Minutes 15   METs 2.1     Biostep-RELP   Level 2   Minutes 15   METs 2     Home Exercise Plan   Plans to continue exercise at Home  Stationary bike   Frequency Add 4 additional days to program exercise sessions.       Nutrition:  Target Goals: Understanding of nutrition guidelines, daily intake of sodium <1578m, cholesterol <2023m calories 30% from fat and 7% or less from saturated fats, daily to have 5 or more servings of fruits and vegetables.  Biometrics:     Pre Biometrics - 12/14/15 1321      Pre Biometrics   Height 5' 1.2" (1.554 m)   Weight 151 lb 11.2 oz (68.8 kg)   Waist Circumference 42 inches   Hip Circumference 39.8 inches   Waist to Hip Ratio 1.06 %   BMI (Calculated) 28.5   Single Leg Stand 30 seconds       Nutrition Therapy Plan and Nutrition Goals:     Nutrition Therapy & Goals - 12/14/15 1226      Nutrition Therapy   Diet --  Erika Martinez wants to meet with the Registered Dietician to help her lose weight. Erika Martinez has cut out bread and sodas.       Nutrition Discharge: Rate Your Plate Scores:   Psychosocial:  Target Goals: Acknowledge presence or absence of depression, maximize coping skills, provide positive support system. Participant is able to verbalize types and ability to use techniques and skills needed for reducing stress and depression.  Initial Review & Psychosocial Screening:     Initial Psych Review & Screening - 12/14/15 [place]? Yes      Quality of Life Scores:     Quality of Life - 12/14/15 1323      Quality of Life Scores    Health/Function Pre 20.69 %   Socioeconomic Pre 20.38 %   Psych/Spiritual Pre 21 %   Family Pre 20 %   GLOBAL Pre 20.58 %      PHQ-9: Recent Review Flowsheet Data    Depression screen [facility] 2/9 12/14/2015 10/03/2015 07/03/2015 04/06/2015 03/14/2015   Decreased Interest 1 0 0 0 0   Down, Depressed, Hopeless 1 0 0 0 0   PHQ - 2 Score 2 0 0 0 0   Altered sleeping 1 - - - -   Tired, decreased energy 2 - - - -   Change in appetite 2 - - - -   Feeling bad or failure about yourself  1 - - - -   Trouble concentrating 0 - - - -   Moving slowly or fidgety/restless 0 - - - -   Suicidal thoughts 0 - - - -   PHQ-9 Score 8 - - - -   Difficult doing work/chores Somewhat difficult - - - -      Psychosocial Evaluation and Intervention:     Psychosocial Evaluation - 01/07/16 1231      Psychosocial Evaluation & Interventions   Comments Counselor met with Erika Martinez today as she has reutrned to this program.   She is 60 years old and reports she is in the lung transplant process to see if she will be chosen.  Erika Martinez has Pulmonary Fibrosis.  She has a strong support system with her sister and a daughter who live close by.  She is actively involved in her local church community.  Erika Martinez states she sleeps "okay" with some nights needing to take a prescribed medication to help with her anxiety.  She states her appetite is good most of the time and she denies a history of depression, but some anxiety which is being treated.  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  Erika Martinez goals for this program are to increase her stamina and strength.  Counselor will continue to follow with her during the course of this program.         Psychosocial Re-Evaluation:  Education: Education Goals: Education classes will be provided on a weekly basis, covering required topics. Participant  will state understanding/return demonstration of topics presented.  Learning Barriers/Preferences:     Learning Barriers/Preferences - 12/14/15 1204      Learning Barriers/Preferences   Learning Barriers Sight  wears reading glasses      Education Topics: Initial Evaluation Education: - Verbal, written and demonstration of respiratory meds, RPE/PD scales, oximetry and breathing techniques. Instruction on use of nebulizers and MDIs: cleaning and proper use, rinsing mouth with steroid doses and importance of monitoring MDI activations. Flowsheet Row Documentation from 03/14/2015 in [place]  Date  12/05/14  Educator  LB  Instruction Review Code  2- meets goals/outcomes      General Nutrition Guidelines/Fats and Fiber: -Group instruction provided by verbal, written material, models and posters to present the general guidelines for heart healthy nutrition. Gives an explanation and review of dietary fats and fiber. Flowsheet Row Pulmonary Rehab from 01/11/2016 in [facility] Cardiac and Pulmonary Rehab  Date  12/31/15  Educator  CR  Instruction Review Code  2- meets goals/outcomes      Controlling Sodium/Reading Food Labels: -Group verbal and written material supporting the discussion of sodium use in heart healthy nutrition. Review and explanation with models, verbal and written materials for utilization of the food label. Flowsheet Row Pulmonary Rehab from 01/11/2016 in [facility] Cardiac and Pulmonary Rehab  Date  01/07/16  Educator  CR  Instruction Review Code  2- meets goals/outcomes      Exercise Physiology & Risk Factors: - Group verbal and written instruction with models to review the exercise physiology of the cardiovascular system and associated critical values. Details cardiovascular disease risk factors and the goals associated with each risk factor. Flowsheet Row Pulmonary Rehab from 01/11/2016 in [facility] Cardiac and Pulmonary Rehab  Date  12/26/15   Educator  Alberteen Sam and Nada Maclachlan   Instruction Review Code  2- meets goals/outcomes      Aerobic Exercise & Resistance Training: - Gives group verbal and written discussion on the health impact of inactivity. On the components of aerobic and resistive training programs and the benefits of this training and how to safely progress through these programs.   Flexibility, Balance, General Exercise Guidelines: - Provides group verbal and written instruction on the benefits of flexibility and balance training programs. Provides general exercise guidelines with specific guidelines to those with heart or lung disease. Demonstration and skill practice provided.   Stress Management: - Provides group verbal and written instruction about the health risks of elevated stress, cause of high stress, and healthy ways to reduce stress. Flowsheet Row Pulmonary Rehab from 01/11/2016 in [facility] Cardiac and Pulmonary Rehab  Date  12/19/15  Educator  Kathreen Cornfield  Instruction Review Code  2- meets goals/outcomes      Depression: - Provides group verbal and written instruction on the correlation between heart/lung disease and depressed mood, treatment options, and the stigmas associated with seeking treatment.   Exercise & Equipment Safety: - Individual verbal instruction and demonstration of equipment use and safety with use of the equipment. Flowsheet Row Pulmonary Rehab from 01/11/2016 in [facility] Cardiac and Pulmonary Rehab  Date  12/14/15  Educator  C. EnterkinRN  Instruction Review Code  1- partially meets, needs review/practice      Infection Prevention: - Provides verbal and written material to individual with discussion of infection control including proper hand washing and proper equipment cleaning during exercise session. Flowsheet Row Pulmonary Rehab from 01/11/2016 in [facility] Cardiac and Pulmonary Rehab  Date  12/14/15  Educator  C. EnterkinRN  Instruction Review Code  2- meets goals/outcomes       Falls Prevention: - Provides verbal and written material to individual with discussion of falls prevention and safety. Flowsheet Row Pulmonary Rehab from 01/11/2016 in [facility] Cardiac and Pulmonary Rehab  Date  12/14/15  Educator  C. [place]  Instruction Review Code  2- meets goals/outcomes      Diabetes: - Individual verbal and written instruction to review signs/symptoms of diabetes, desired ranges of glucose level fasting, after meals and with exercise. Advice that pre and post exercise glucose checks will be done for  3 sessions at entry of program.   Chronic Lung Diseases: - Group verbal and written instruction to review new updates, new respiratory medications, new advancements in procedures and treatments. Provide informative websites and "800" numbers of self-education. Flowsheet Row Pulmonary Rehab from 01/11/2016 in [facility] Cardiac and Pulmonary Rehab  Date  12/24/15  Educator  LB  Instruction Review Code  2- meets goals/outcomes      Lung Procedures: - Group verbal and written instruction to describe testing methods done to diagnose lung disease. Review the outcome of test results. Describe the treatment choices: Pulmonary Function Tests, ABGs and oximetry. Flowsheet Row Documentation from 03/14/2015 in [place]  Date  01/12/15  Educator  sj  Instruction Review Code  2- meets goals/outcomes      Energy Conservation: - Provide group verbal and written instruction for methods to conserve energy, plan and organize activities. Instruct on pacing techniques, use of adaptive equipment and posture/positioning to relieve shortness of breath. Flowsheet Row Documentation from 03/14/2015 in [place]  Date  01/24/15  Educator  SW  Instruction Review Code  2- meets goals/outcomes      Triggers: - Group verbal and written instruction to review types of environmental controls: home humidity, furnaces,  filters, dust mite/pet prevention, HEPA vacuums. To discuss weather changes, air quality and the benefits of nasal washing. Flowsheet Row Documentation from 03/14/2015 in [place]  Date  12/18/14  Educator  LB  Instruction Review Code  2- meets goals/outcomes      Exacerbations: - Group verbal and written instruction to provide: warning signs, infection symptoms, calling MD promptly, preventive modes, and value of vaccinations. Review: effective airway clearance, coughing and/or vibration techniques. Create an Sports administrator.   Oxygen: - Individual and group verbal and written instruction on oxygen therapy. Includes supplement oxygen, available portable oxygen systems, continuous and intermittent flow rates, oxygen safety, concentrators, and Medicare reimbursement for oxygen. Flowsheet Row Documentation from 03/14/2015 in [place]  Date  12/05/14  Educator  LB  Instruction Review Code  2- meets goals/outcomes      Respiratory Medications: - Group verbal and written instruction to review medications for lung disease. Drug class, frequency, complications, importance of spacers, rinsing mouth after steroid MDI's, and proper cleaning methods for nebulizers. Flowsheet Row Documentation from 03/14/2015 in [place]  Date  12/22/14  Educator  [place]  Instruction Review Code  2- meets goals/outcomes [Spacer and instructions given to pt. ]      AED/CPR: - Group verbal and written instruction with the use of models to demonstrate the basic use of the AED with the basic ABC's of resuscitation. Flowsheet Row Pulmonary Rehab from 01/11/2016 in [facility] Cardiac and Pulmonary Rehab  Date  01/11/16  Educator  CE  Instruction Review Code  2- meets goals/outcomes      Breathing Retraining: - Provides individuals verbal and written instruction on purpose, frequency, and proper technique of  diaphragmatic breathing and pursed-lipped breathing. Applies individual practice skills. Flowsheet Row Pulmonary Rehab from 01/11/2016 in [facility] Cardiac and Pulmonary Rehab  Date  12/19/15  Educator  [place] [facility]  Instruction Review Code  2- meets goals/outcomes      Anatomy and Physiology of the Lungs: - Group verbal and written instruction with the use of models to provide basic lung anatomy and physiology related to function, structure and complications of lung disease. Flowsheet Row Documentation from 03/14/2015  in [place]  Date  02/23/15  Educator  C. Enterkin, RN  Instruction Review Code  2- meets goals/outcomes      Heart Failure: - Group verbal and written instruction on the basics of heart failure: signs/symptoms, treatments, explanation of ejection fraction, enlarged heart and cardiomyopathy. Flowsheet Row Documentation from 03/14/2015 in [place]  Date  12/29/14  Educator  [place]  Instruction Review Code  2- meets goals/outcomes      Sleep Apnea: - Individual verbal and written instruction to review Obstructive Sleep Apnea. Review of risk factors, methods for diagnosing and types of masks and machines for OSA.   Anxiety: - Provides group, verbal and written instruction on the correlation between heart/lung disease and anxiety, treatment options, and management of anxiety. Flowsheet Row Documentation from 03/14/2015 in [place]  Date  01/31/15  Educator  [facility]  Instruction Review Code  2- Meets goals/outcomes      Relaxation: - Provides group, verbal and written instruction about the benefits of relaxation for patients with heart/lung disease. Also provides patients with examples of relaxation techniques.   Knowledge Questionnaire Score:     Knowledge Questionnaire Score - 12/14/15 1324      Knowledge Questionnaire Score   Pre Score 9       Core  Components/Risk Factors/Patient Goals at Admission:     Personal Goals and Risk Factors at Admission - 12/14/15 1209      Core Components/Risk Factors/Patient Goals on Admission    Weight Management Yes   Intervention Weight Management: Develop a combined nutrition and exercise program designed to reach desired caloric intake, while maintaining appropriate intake of nutrient and fiber, sodium and fats, and appropriate energy expenditure required for the weight goal.;Weight Management: Provide education and appropriate resources to help participant work on and attain dietary goals.;Weight Management/Obesity: Establish reasonable short term and long term weight goals.;Obesity: Provide education and appropriate resources to help participant work on and attain dietary goals.   Admit Weight --  On prednisone currently. Erika Martinez reports she needs to lose 10 lbs for her potential [facility]   Goal Weight: Short Term 140 lb (63.5 kg)   Goal Weight: Long Term 125 lb (56.7 kg)   Expected Outcomes Short Term: Continue to assess and modify interventions until short term weight is achieved;Weight Maintenance: Understanding of the daily nutrition guidelines, which includes 25-35% calories from fat, 7% or less cal from saturated fats, less than 24m cholesterol, less than 1.5gm of sodium, & 5 or more servings of fruits and vegetables daily;Weight Loss: Understanding of general recommendations for a balanced deficit meal plan, which promotes 1-2 lb weight loss per week and includes a negative energy balance of [id number] kcal/d;Understanding recommendations for meals to include 15-35% energy as protein, 25-35% energy from fat, 35-60% energy from carbohydrates, less than 2083mof dietary cholesterol, 20-35 gm of total fiber daily;Weight Gain: Understanding of general recommendations for a high calorie, high protein meal plan that promotes weight gain by distributing calorie intake throughout the day with the  consumption for 4-5 meals, snacks, and/or supplements   Sedentary Yes   Intervention Provide advice, education, support and counseling about physical activity/exercise needs.;Develop an individualized exercise prescription for aerobic and resistive training based on initial evaluation findings, risk stratification, comorbidities and participant's personal goals.   Expected Outcomes Achievement of increased cardiorespiratory fitness and enhanced flexibility, muscular endurance and strength shown through measurements of functional capacity and personal  statement of participant.   Intervention Provide advice, education, support and counseling about physical activity/exercise needs.;Develop an individualized exercise prescription for aerobic and resistive training based on initial evaluation findings, risk stratification, comorbidities and participant's personal goals.   Expected Outcomes Achievement of increased cardiorespiratory fitness and enhanced flexibility, muscular endurance and strength shown through measurements of functional capacity and personal statement of participant.   Stress Yes   Intervention Offer individual and/or small group education and counseling on adjustment to heart disease, stress management and health-related lifestyle change. Teach and support self-help strategies.;Refer participants experiencing significant psychosocial distress to appropriate mental health specialists for further evaluation and treatment. When possible, include family members and significant others in education/counseling sessions.   Expected Outcomes Short Term: Participant demonstrates changes in health-related behavior, relaxation and other stress management skills, ability to obtain effective social support, and compliance with psychotropic medications if prescribed.;Long Term: Emotional wellbeing is indicated by absence of clinically significant psychosocial distress or social isolation.      Core  Components/Risk Factors/Patient Goals Review:      Goals and Risk Factor Review    Row Name 12/19/15 1335 12/28/15 1330 01/07/16 1504         Core Components/Risk Factors/Patient Goals Review   Personal Goals Review Improve shortness of breath with ADL's;Develop more efficient breathing techniques such as purse lipped breathing and diaphragmatic breathing and practicing self-pacing with activity. Sedentary;Increase Strength and Stamina Increase knowledge of respiratory medications and ability to use respiratory devices properly.;Improve shortness of breath with ADL's;Develop more efficient breathing techniques such as purse lipped breathing and diaphragmatic breathing and practicing self-pacing with activity.     Review Reviewed pursed lip breathing technique with pt today to use during exercise and at home. Erika Martinez is riding her bike at home on off days from rehab.  She starting to notice an increase in her strength and stamina. Erika Martinez has a good understanding of her MDI's. She does have problems with thrush and her Advair. Educated Erika Martinez on her MDI's and SVN being equally important, and if good technique with a spacer is performed, an MDI's can be the same amount of medicine as a SVN. We discussed increasing her oxygen on the treadmill, so she can get her full 10 minutes accomplished with out low O2Sat's and increased shortness of breath. Even with PLB, the treadmill is a challenge.     Expected Outcomes Pt will become independent in using pursed lip breathing to improve overall shortness of breath. Shastina will continue to attend exercise and educaiton classes to boost her functional capacity.  --        Core Components/Risk Factors/Patient Goals at Discharge (Final Review):      Goals and Risk Factor Review - 01/07/16 1504      Core Components/Risk Factors/Patient Goals Review   Personal Goals Review Increase knowledge of respiratory medications and ability to use respiratory devices  properly.;Improve shortness of breath with ADL's;Develop more efficient breathing techniques such as purse lipped breathing and diaphragmatic breathing and practicing self-pacing with activity.   Review Erika Martinez has a good understanding of her MDI's. She does have problems with thrush and her Advair. Educated Erika Martinez on her MDI's and SVN being equally important, and if good technique with a spacer is performed, an MDI's can be the same amount of medicine as a SVN. We discussed increasing her oxygen on the treadmill, so she can get her full 10 minutes accomplished with out low O2Sat's and increased shortness of breath.  Even with PLB, the treadmill is a challenge.      ITP Comments:     ITP Comments    Row Name 12/14/15 1215 12/14/15 1216 12/14/15 1221 12/14/15 1226 01/09/16 1321   ITP Comments taking xanax when she needs it. Yumalay reports it is stressful about the possibility of a lung transplant and all the workup.   Pulse ox down to 82% at end of 6 minute walk even on 8 liters of supplemental oxygen. Pulse ox came up to 92% with rest.  Right femerol area is sore Erika Martinez reports from her heart cath last week and her right groin area and leg are bruised. Erika Martinez reports her right shoulder hurts from the position she had to be for her ultrasound of her liver at [facility] (being evaluated for a lung transplant).  Erika Martinez wants to meet with the Registered Dietician to help her lose weight. Erika Martinez has cut out bread and sodas. Increased Erika Martinez from 6 to 8 liters of oxygen while on XR.  Increased Erika Martinez to 15 liters oxygen using her own nasal cannula with a reservoir while Erika Martinez was on the treadmill.  Erika Martinez was able to walk at a speed of 1.2 mph with 0.5% incline on the treadmill for 10 minutes without stopping.  This is the first time Erika Martinez has been able to do this.        Comments: 30 day note review

## 2016-01-14 NOTE — Progress Notes (Signed)
Daily Session Note  Patient Details  Name: Erika Martinez MRN: [id number] Date of Birth: 05-31-1956 Referring Provider:   April Manson Pulmonary Rehab from 12/14/2015 in [facility] Cardiac and Pulmonary Rehab  Referring Provider  McQuaid      Encounter Date: 01/14/2016  Check In:     Session Check In - 01/14/16 1343      Check-In   Location [facility]-Cardiac & Pulmonary Rehab   Staff Present Carson Myrtle, BS, RRT, Respiratory Therapist;Kelly Amedeo Plenty, BS, ACSM CEP, Exercise Physiologist;Jannett Schmall Oletta Darter, BA, ACSM CEP, Exercise Physiologist   Supervising physician immediately available to respond to emergencies [name] immediately available ER MD   Physician(s) Burlene Arnt and Kinner   Medication changes reported     No   Fall or balance concerns reported    No   Warm-up and Cool-down Performed on first and last piece of equipment   Resistance Training Performed Yes   VAD Patient? No     Pain Assessment   Currently in Pain? No/denies         Goals Met:  Proper associated with RPD/PD & O2 Sat Independence with exercise equipment Exercise tolerated well Strength training completed today  Goals Unmet:  Not Applicable  Comments: Pt able to follow exercise prescription today without complaint.  Will continue to monitor for progression.    Dr. Emily Filbert is Medical Director for [place] and [name].

## 2016-01-16 ENCOUNTER — Encounter: Payer: 59 | Admitting: *Deleted

## 2016-01-16 DIAGNOSIS — J8410 Pulmonary fibrosis, unspecified: Secondary | ICD-10-CM | POA: Diagnosis not present

## 2016-01-16 NOTE — Progress Notes (Signed)
Daily Session Note  Patient Details  Name: KMYA PLACIDE MRN: [id number] Date of Birth: 10-02-1955 Referring Provider:   April Manson Pulmonary Rehab from 12/14/2015 in [facility] Cardiac and Pulmonary Rehab  Referring Provider  McQuaid      Encounter Date: 01/16/2016  Check In:     Session Check In - 01/16/16 1149      Check-In   Location [facility]-Cardiac & Pulmonary Rehab   Staff Present Carson Myrtle, BS, RRT, Respiratory Lennie Hummer, MA, ACSM RCEP, Exercise Physiologist;Rakeb Kibble Joya Gaskins, RN, BSN   Supervising physician immediately available to respond to emergencies [name] immediately available ER MD   Physician(s) Dr. Marcelene Butte and Dr. Jimmye Norman   Medication changes reported     No   Fall or balance concerns reported    No   Warm-up and Cool-down Performed on first and last piece of equipment   Resistance Training Performed Yes   VAD Patient? No     Pain Assessment   Currently in Pain? No/denies         Goals Met:  Proper associated with RPD/PD & O2 Sat Exercise tolerated well Strength training completed today  Goals Unmet:  Not Applicable  Comments:  Pt able to follow exercise prescription today without complaint.  Will continue to monitor for progression.    Dr. Emily Filbert is Medical Director for [place] and [name].

## 2016-01-18 ENCOUNTER — Encounter: Payer: 59 | Admitting: Respiratory Therapy

## 2016-01-18 DIAGNOSIS — J8410 Pulmonary fibrosis, unspecified: Secondary | ICD-10-CM | POA: Diagnosis not present

## 2016-01-18 NOTE — Progress Notes (Signed)
Daily Session Note  Patient Details  Name: Erika Martinez MRN: [id number] Date of Birth: [birth year] Referring Provider:   April Manson Pulmonary Rehab from 12/14/2015 in [facility] Cardiac and Pulmonary Rehab  Referring Provider  McQuaid      Encounter Date: 01/18/2016  Check In:     Session Check In - 01/18/16 1312      Check-In   Location [facility]-Cardiac & Pulmonary Rehab   Staff Present Carson Myrtle, BS, RRT, Respiratory Lennie Hummer, MA, ACSM RCEP, Exercise Physiologist;Amanda Oletta Darter, BA, ACSM CEP, Exercise Physiologist   Supervising physician immediately available to respond to emergencies [name] immediately available ER MD   Physician(s) Jacqualine Code and Marcelene Butte   Medication changes reported     No   Fall or balance concerns reported    No   Warm-up and Cool-down Performed as group-led instruction   Resistance Training Performed Yes   VAD Patient? No     Pain Assessment   Currently in Pain? No/denies         Goals Met:  Proper associated with RPD/PD & O2 Sat Independence with exercise equipment Using PLB without cueing & demonstrates good technique Exercise tolerated well No report of cardiac concerns or symptoms Strength training completed today  Goals Unmet:  Not Applicable  Comments: Pt able to follow exercise prescription today without complaint.  Will continue to monitor for progression.   Dr. Emily Filbert is Medical Director for [place] and [name].

## 2016-01-21 DIAGNOSIS — J8410 Pulmonary fibrosis, unspecified: Secondary | ICD-10-CM

## 2016-01-21 NOTE — Progress Notes (Signed)
Daily Session Note  Patient Details  Name: Erika Martinez MRN: [id number] Date of Birth: [birth year] Referring Provider:   April Manson Pulmonary Rehab from 12/14/2015 in [facility] Cardiac and Pulmonary Rehab  Referring Provider  McQuaid      Encounter Date: 01/21/2016  Check In:     Session Check In - 01/21/16 1147      Check-In   Location [facility]-Cardiac & Pulmonary Rehab   Staff Present Carson Myrtle, BS, RRT, Respiratory Bertis Ruddy, BS, ACSM CEP, Exercise Physiologist;Montzerrat Brunell Brayton El, DPT, CEEA   Supervising physician immediately available to respond to emergencies [name] immediately available ER MD   Physician(s) Bayard Beaver and Marcelene Butte   Medication changes reported     No   Fall or balance concerns reported    No   Warm-up and Cool-down Performed on first and last piece of equipment   Resistance Training Performed Yes   VAD Patient? No     Pain Assessment   Currently in Pain? No/denies         Goals Met:  Independence with exercise equipment  Goals Unmet:  Not Applicable  Comments: Patient completed exercise prescription and all exercise goals during rehab session. The exercise was tolerated well and the patient is progressing in the program.    Dr. Emily Filbert is Medical Director for [place] and [name].

## 2016-01-23 ENCOUNTER — Encounter: Payer: Self-pay | Admitting: Family Medicine

## 2016-01-23 ENCOUNTER — Ambulatory Visit (INDEPENDENT_AMBULATORY_CARE_PROVIDER_SITE_OTHER): Payer: 59 | Admitting: Family Medicine

## 2016-01-23 VITALS — BP 106/64 | HR 94 | Temp 98.8°F | Resp 20 | Wt 147.8 lb

## 2016-01-23 DIAGNOSIS — F419 Anxiety disorder, unspecified: Secondary | ICD-10-CM

## 2016-01-23 DIAGNOSIS — E663 Overweight: Secondary | ICD-10-CM

## 2016-01-23 DIAGNOSIS — J8410 Pulmonary fibrosis, unspecified: Secondary | ICD-10-CM | POA: Diagnosis not present

## 2016-01-23 DIAGNOSIS — Z7682 Awaiting organ transplant status: Secondary | ICD-10-CM | POA: Diagnosis not present

## 2016-01-23 NOTE — Progress Notes (Signed)
Name: Erika Martinez   MRN: [id number]    DOB: [birth year]   Date:01/23/2016       Progress Note  Subjective  Chief Complaint  Chief Complaint  Patient presents with  . Follow-up    Discuss how to achieve goals for Lung Transplant-patient has to lose 7-10 pounds Target Goal is 137-140 and wean off of her Xanax and raise $15,000 for medication after surgery.    HPI  Pulmonary fibrosis: she was diagnosed in 09/2013 secondary to scleroderma, she see pulmonologist, Dr. Lake Bells. She was denied last year because she was HCV positive, she has been treated with Havoni and in June titer was negative, and repeat titer negative 05/2015. She does not have liver cirrhosis. She is on oxygen 2-3 liters at rest and advised by pulmonologist to 4-6 liters during activity ( at the gym ), she is now on Imuran, and bactrin three times weekly, weaning off prednisone since HCV titers are back to normal . She has been approved for the lung transplant, but need to be listed once she loses 7 lbs to get to a goal weight of 140 lbs, wean off BZD completely and have $15.000 in her bank. She has lost 3 lbs since last visit, she is weaning self off Alprazolam and is down to half pill daily, and is willing to stop it. 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   Patient Active Problem List   Diagnosis Date Noted  . Raynaud disease 11/26/2015  . Awaiting organ transplant status 08/29/2015  . Decreased GFR 07/24/2015  . Mild major depression ([place]) 06/07/2015  . Hypertension, benign 04/06/2015  . History of hepatitis C 02/02/2015  . Insomnia 02/02/2015  . Gastro-esophageal reflux disease without esophagitis 02/02/2015  . IBS (irritable bowel syndrome) 02/02/2015  . Low serum cobalamin 02/02/2015  . Dependence on supplemental oxygen 02/02/2015  . Perennial allergic rhinitis with seasonal variation 02/02/2015  . Scleroderma ([place]) 02/02/2015  . SPL (spondylolisthesis) 02/02/2015  . Supraventricular tachycardia  ([place]) 02/02/2015  . Vitamin D deficiency 02/02/2015  . Anxiety 02/02/2015  . History of pneumococcal pneumonia 09/13/2013  . Chronic hypoxemic respiratory failure ([place]) 09/13/2013  . Polycythemia, secondary 09/13/2013  . 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 ABDOMINAL HYSTERECTOMY    . VAGINAL HYSTERECTOMY      Family History  Problem Relation Age of Onset  . Lung cancer Father   . Cancer Father   . Alzheimer's disease Mother   . Breast cancer Neg Hx     Social History   Social History  . Marital status: Divorced    Spouse name: N/A  . Number of children: N/A  . Years of education: N/A   Occupational History  . Not on file.   Social History Main Topics  . Smoking status: Never Smoker  . Smokeless tobacco: Never Used  . Alcohol use 2.0 oz/week    4 Standard drinks or equivalent per week     Comment: "social drinker"  . Drug use: No  . Sexual activity: Not on file   Other Topics Concern  . Not on file   Social History Narrative  . No narrative on file     Current Outpatient Prescriptions:  .  ADVAIR DISKUS 500-50 MCG/DOSE AEPB, INHALE 1 PUFF INTO THE LUNGS 2 (TWO) TIMES DAILY., Disp: 60 each, Rfl: 5 .  albuterol (PROVENTIL) (2.5 MG/3ML) 0.083% nebulizer solution, Take 3 mLs (2.5 mg  total) by nebulization every 4 (four) hours as needed for wheezing or shortness of breath. DX Code: J84.10, Disp: 75 mL, Rfl: 12 .  ALPRAZolam (XANAX) 0.5 MG tablet, TAKE 1 TABLET BY MOUTH 3 TIMES A DAY AS NEEDED FOR ANXIETY, Disp: 30 tablet, Rfl: 2 .  azaTHIOprine (IMURAN) 50 MG tablet, Take 2 tablets (100 mg total) by mouth daily. Daily, Disp: 60 tablet, Rfl: 5 .  cholecalciferol (VITAMIN D) 1000 UNITS tablet, Take 2,000 Units by mouth daily., Disp: , Rfl:  .  diltiazem (CARDIZEM) 60 MG tablet, TAKE 1 TABLET (60 MG TOTAL) BY MOUTH DAILY., Disp: 30 tablet, Rfl: 2 .  fluconazole (DIFLUCAN) 150 MG tablet, TAKE 1  TABLET WEEKLY AS NEEDED FOR THRUSH, Disp: 4 tablet, Rfl: 2 .  fluticasone (FLONASE) 50 MCG/ACT nasal spray, Place 2 sprays into both nostrils daily., Disp: 16 g, Rfl: 6 .  loratadine (CLARITIN) 10 MG tablet, TAKE 1 TABLET (10 MG TOTAL) BY MOUTH 2 (TWO) TIMES DAILY., Disp: 60 tablet, Rfl: 3 .  nystatin (MYCOSTATIN) 100000 UNIT/ML suspension, TAKE 1 TABLESPOON BY MOUTH BEFORE MEALS AND AT BEDTIME (SWISH AND SPIT OUT) (Patient taking differently: TAKE 1 TABLESPOON BY MOUTH BEFORE MEALS AND AT BEDTIME (SWISH AND SPIT OUT) as needed), Disp: 473 mL, Rfl: 2 .  omeprazole (PRILOSEC) 40 MG capsule, TAKE 1 CAPSULE TWICE DAILY, Disp: 60 capsule, Rfl: 2 .  PEG 3350-KCl-NaBcb-NaCl-NaSulf (PEG-3350/ELECTROLYTES) 236 g SOLR, Take by mouth., Disp: , Rfl:  .  predniSONE (DELTASONE) 10 MG tablet, Take 1 tablet (10 mg total) by mouth daily with breakfast., Disp: 30 tablet, Rfl: 2 .  sulfamethoxazole-trimethoprim (BACTRIM DS,SEPTRA DS) 800-160 MG tablet, TAKE 1 TABLET BY MOUTH 3 (THREE) TIMES A WEEK., Disp: , Rfl: 5 .  VENTOLIN HFA 108 (90 Base) MCG/ACT inhaler, INHALE 2 PUFFS INTO THE LUNGS EVERY 4 (FOUR) HOURS AS NEEDED FOR WHEEZING OR SHORTNESS OF BREATH., Disp: 18 g, Rfl: 5 .  vitamin B-12 (CYANOCOBALAMIN) 250 MCG tablet, Place 250 mcg under the tongue every other day. , Disp: , Rfl:   No Known Allergies   ROS  Constitutional: Negative for fever, positive for mild weight change.  Respiratory: Positive  for cough and shortness of breath.   Cardiovascular: Negative for chest pain or palpitations.  Gastrointestinal: Negative for abdominal pain, no bowel changes.  Musculoskeletal: Negative for gait problem or joint swelling.  Skin: Negative for rash.  Neurological: Negative for dizziness or headache.  No other specific complaints in a complete review of systems (except as listed in HPI above).  Objective  Vitals:   01/23/16 1133  BP: 106/64  Pulse: 94  Resp: 20  Temp: 98.8 F (37.1 C)  TempSrc: Oral   SpO2: 94%  Weight: 147 lb 12.8 oz (67 kg)    Body mass index is 27.93 kg/m.  Physical Exam  Constitutional: Patient appears well-developed and well-nourished. Obese  HEENT: head atraumatic, normocephalic, pupils equal and reactive to light, neck supple, throat within normal limits Cardiovascular: Normal rate, regular rhythm and normal heart sounds.  No murmur heard. 1 plus BLE edema. Pulmonary/Chest: Effort normal very fine end inspiratory  crackles today Abdominal: Soft.  There is no tenderness. Psychiatric: Patient has a normal mood and affect. behavior is normal. Judgment and thought content normal. Skin violaceous nail beds  Recent Results (from the past 2160 hour(s))  CBC with Differential/Platelet     Status: Abnormal   Collection Time: 11/06/15  3:00 PM  Result Value Ref Range   WBC 16.8 (H) 3.6 -  11.0 K/uL   RBC 4.51 3.80 - 5.20 MIL/uL   Hemoglobin 12.6 12.0 - 16.0 g/dL   HCT 38.7 35.0 - 47.0 %   MCV 85.8 80.0 - 100.0 fL   MCH 28.0 26.0 - 34.0 pg   MCHC 32.7 32.0 - 36.0 g/dL   RDW 14.6 (H) 11.5 - 14.5 %   Platelets 266 150 - 440 K/uL   Neutrophils Relative % 93% %   Neutro Abs 15.6 (H) 1.4 - 6.5 K/uL   Lymphocytes Relative 4% %   Lymphs Abs 0.7 (L) 1.0 - 3.6 K/uL   Monocytes Relative 3% %   Monocytes Absolute 0.4 0.2 - 0.9 K/uL   Eosinophils Relative 0% %   Eosinophils Absolute 0.0 0 - 0.7 K/uL   Basophils Relative 0% %   Basophils Absolute 0.1 0 - 0.1 K/uL  Hepatic function panel     Status: Abnormal   Collection Time: 11/06/15  3:00 PM  Result Value Ref Range   Total Protein 7.6 6.5 - 8.1 g/dL   Albumin 4.1 3.5 - 5.0 g/dL   AST 25 15 - 41 U/L   ALT 25 14 - 54 U/L   Alkaline Phosphatase 63 38 - 126 U/L   Total Bilirubin 0.4 0.3 - 1.2 mg/dL   Bilirubin, Direct <0.1 (L) 0.1 - 0.5 mg/dL   Indirect Bilirubin NOT CALCULATED 0.3 - 0.9 mg/dL      PHQ2/9: Depression screen [facility] 2/9 12/14/2015 10/03/2015 07/03/2015 04/06/2015 03/14/2015  Decreased Interest 1 0 0 0  0  Down, Depressed, Hopeless 1 0 0 0 0  PHQ - 2 Score 2 0 0 0 0  Altered sleeping 1 - - - -  Tired, decreased energy 2 - - - -  Change in appetite 2 - - - -  Feeling bad or failure about yourself  1 - - - -  Trouble concentrating 0 - - - -  Moving slowly or fidgety/restless 0 - - - -  Suicidal thoughts 0 - - - -  PHQ-9 Score 8 - - - -  Difficult doing work/chores Somewhat difficult - - - -     Fall Risk: Fall Risk  12/14/2015 10/03/2015 07/03/2015 109/20/2016 04/06/2015  Falls in the past year? No No No No No     Assessment & Plan  1. Pulmonary fibrosis ([place])  She is scared but also looking forward to getting on transplant list.   2. Awaiting organ transplant status  Starting a funraiser  3. Anxiety  She is wining self off Alprazolam   4. Overweight (BMI 25.0-29.9)  She is down to 5 mg of Prednisone and is going back to work in September and will be more active

## 2016-01-25 ENCOUNTER — Other Ambulatory Visit: Payer: Self-pay | Admitting: Family Medicine

## 2016-01-25 ENCOUNTER — Encounter: Payer: 59 | Admitting: Respiratory Therapy

## 2016-01-25 DIAGNOSIS — J8410 Pulmonary fibrosis, unspecified: Secondary | ICD-10-CM

## 2016-01-25 NOTE — Progress Notes (Signed)
Daily Session Note  Patient Details  Name: Erika Martinez MRN: [id number] Date of Birth: 14-Jul-1955 Referring Provider:   April Manson Pulmonary Rehab from 12/14/2015 in [facility] Cardiac and Pulmonary Rehab  Referring Provider  McQuaid      Encounter Date: 01/25/2016  Check In:     Session Check In - 01/25/16 1243      Check-In   Location [facility]-Cardiac & Pulmonary Rehab   Staff Present Nada Maclachlan, BA, ACSM CEP, Exercise Physiologist;Bob Daversa Blanch Media, RRT, RCP, Respiratory Lennie Hummer, MA, ACSM RCEP, Exercise Physiologist;Carroll Enterkin, RN, BSN   Supervising physician immediately available to respond to emergencies [name] immediately available ER MD   Physician(s) Dr. Alfred Levins and [facility]   Medication changes reported     No   Fall or balance concerns reported    No   Warm-up and Cool-down Performed on first and last piece of equipment   Resistance Training Performed No   VAD Patient? No     Pain Assessment   Currently in Pain? No/denies         Goals Met:  Proper associated with RPD/PD & O2 Sat Independence with exercise equipment Using PLB without cueing & demonstrates good technique Exercise tolerated well Strength training completed today  Goals Unmet:  Not Applicable  Comments: Pt able to follow exercise prescription today without complaint.  Will continue to monitor for progression.   Dr. Emily Filbert is Medical Director for [place] and [name].

## 2016-01-28 DIAGNOSIS — J8410 Pulmonary fibrosis, unspecified: Secondary | ICD-10-CM

## 2016-01-28 NOTE — Progress Notes (Signed)
Daily Session Note  Patient Details  Name: Erika Martinez MRN: [id number] Date of Birth: [birth year] Referring Provider:   April Manson Pulmonary Rehab from 12/14/2015 in [facility] Cardiac and Pulmonary Rehab  Referring Provider  McQuaid      Encounter Date: 01/28/2016  Check In:     Session Check In - 01/28/16 1345      Check-In   Location [facility]-Cardiac & Pulmonary Rehab   Staff Present Carson Myrtle, BS, RRT, Respiratory Therapist;Kelly Amedeo Plenty, BS, ACSM CEP, Exercise Physiologist;Othello Dickenson Oletta Darter, BA, ACSM CEP, Exercise Physiologist   Supervising physician immediately available to respond to emergencies [name] immediately available ER MD   Physician(s) Alfred Levins and Jimmye Norman   Medication changes reported     No   Fall or balance concerns reported    No   Warm-up and Cool-down Performed as group-led instruction   Resistance Training Performed Yes   VAD Patient? No     Pain Assessment   Currently in Pain? No/denies         Goals Met:  Proper associated with RPD/PD & O2 Sat Independence with exercise equipment Exercise tolerated well Strength training completed today  Goals Unmet:  Not Applicable  Comments: Pt able to follow exercise prescription today without complaint.  Will continue to monitor for progression.    Dr. Emily Filbert is Medical Director for [place] and [name].

## 2016-01-30 ENCOUNTER — Encounter: Payer: 59 | Admitting: *Deleted

## 2016-01-30 DIAGNOSIS — J8410 Pulmonary fibrosis, unspecified: Secondary | ICD-10-CM

## 2016-01-30 NOTE — Progress Notes (Signed)
Daily Session Note  Patient Details  Name: Erika Martinez MRN: [id number] Date of Birth: 1956/03/27 Referring Provider:   April Manson Pulmonary Rehab from 12/14/2015 in [facility] Cardiac and Pulmonary Rehab  Referring Provider  McQuaid      Encounter Date: 01/30/2016  Check In:     Session Check In - 01/30/16 1147      Check-In   Location [facility]-Cardiac & Pulmonary Rehab   Staff Present Nyoka Cowden, RN, BSN, Willette Pa, MA, ACSM RCEP, Exercise Physiologist;Laureen Janell Quiet, RRT, Respiratory Therapist   Supervising physician immediately available to respond to emergencies [name] immediately available ER MD   Physician(s) Drs. Raechel Ache and Williams   Medication changes reported     No   Fall or balance concerns reported    No   Warm-up and Cool-down Performed as group-led Location manager Performed Yes   VAD Patient? No     Pain Assessment   Currently in Pain? No/denies   Multiple Pain Sites No         Goals Met:  Proper associated with RPD/PD & O2 Sat Independence with exercise equipment Using PLB without cueing & demonstrates good technique Exercise tolerated well Strength training completed today  Goals Unmet:  Not Applicable  Comments: Pt able to follow exercise prescription today without complaint.  Will continue to monitor for progression.    Dr. Emily Filbert is Medical Director for [place] and [name].

## 2016-02-01 ENCOUNTER — Ambulatory Visit: Payer: 59 | Admitting: Pulmonary Disease

## 2016-02-01 ENCOUNTER — Encounter: Payer: 59 | Admitting: Respiratory Therapy

## 2016-02-01 DIAGNOSIS — J8410 Pulmonary fibrosis, unspecified: Secondary | ICD-10-CM | POA: Diagnosis not present

## 2016-02-01 NOTE — Progress Notes (Signed)
Daily Session Note  Patient Details  Name: Erika Martinez MRN: [id number] Date of Birth: 27-Dec-1955 Referring Provider:   April Manson Pulmonary Rehab from 12/14/2015 in [place] Card[facility] and Pul[facility] Re[facility]  Referring Provider  McQuaid      Encounter Date: 02/01/2016  Check In:     Session Check In - 02/01/16 1158      Check-In   Location [facility] & Pul[facility] Re[facility]   Staff Present Alberteen Sam, MA, ACSM RCEP, Exercise Physiologist;Stacey Blanch Media, RRT, RCP, Respiratory Dareen Piano, BA, ACSM CEP, Exercise Physiologist;Other   Supervising physician immediately available to respond to emergencies [name] immediately available ER MD   Physician(s) Drs. Schaevitz and Archie Balboa   Medication changes reported     No   Fall or balance concerns reported    No   Warm-up and Cool-down Performed as group-led Location manager Performed Yes   VAD Patient? No     Pain Assessment   Currently in Pain? No/denies   Multiple Pain Sites No         Goals Met:  Proper associated with RPD/PD & O2 Sat Independence with exercise equipment Using PLB without cueing & demonstrates good technique Exercise tolerated well Strength training completed today  Goals Unmet:  Not Applicable  Comments: Pt able to follow exercise prescription today without complaint.  Will continue to monitor for progression.    Dr. Emily Filbert is Medical Director for [place] and [name].

## 2016-02-06 ENCOUNTER — Encounter: Payer: 59 | Admitting: *Deleted

## 2016-02-06 DIAGNOSIS — J8410 Pulmonary fibrosis, unspecified: Secondary | ICD-10-CM | POA: Diagnosis not present

## 2016-02-06 NOTE — Progress Notes (Signed)
Daily Session Note  Patient Details  Name: Erika Martinez MRN: [id number] Date of Birth: 1955-09-28 Referring Provider:   April Manson Pulmonary Rehab from 12/14/2015 in [facility] Cardiac and Pulmonary Rehab  Referring Provider  McQuaid      Encounter Date: 02/06/2016  Check In:     Session Check In - 02/06/16 1323      Check-In   Location [facility] & Pulmonary Re[facility]   Staff Present Heath Lark, RN, BSN, CCRP;Navin Dogan Luan Pulling, MA, ACSM RCEP, Exercise Physiologist;Laureen Owens Shark, BS, RRT, Respiratory Therapist   Supervising physician immediately available to respond to emergencies [name] immediately available ER MD   Physician(s) Drs. Karma Greaser and Alfred Levins   Medication changes reported     No   Fall or balance concerns reported    No   Warm-up and Cool-down Performed as group-led Location manager Performed Yes   VAD Patient? No     Pain Assessment   Currently in Pain? No/denies   Multiple Pain Sites No         Goals Met:  Proper associated with RPD/PD & O2 Sat Independence with exercise equipment Using PLB without cueing & demonstrates good technique Exercise tolerated well Strength training completed today  Goals Unmet:  Not Applicable  Comments: Pt able to follow exercise prescription today without complaint.  Will continue to monitor for progression. Completed walk titration test at [facility] for mid program assessment.  Dr. Emily Filbert is Medical Director for [place] and [name].

## 2016-02-07 ENCOUNTER — Encounter: Payer: Self-pay | Admitting: Pulmonary Disease

## 2016-02-07 ENCOUNTER — Other Ambulatory Visit: Payer: Self-pay | Admitting: Family Medicine

## 2016-02-07 ENCOUNTER — Ambulatory Visit (INDEPENDENT_AMBULATORY_CARE_PROVIDER_SITE_OTHER): Payer: 59 | Admitting: Pulmonary Disease

## 2016-02-07 DIAGNOSIS — J8410 Pulmonary fibrosis, unspecified: Secondary | ICD-10-CM

## 2016-02-07 NOTE — Progress Notes (Signed)
History of Present Illness Erika Martinez is a 60 y.o. female with connective tissue disease associated with UIP,    Synopsis: Erika Martinez has connective tissue disease associated UIP and she first saw [place] [facility] in 2015 after a hospitalization for hypoxemic respiratory failure.  She was found to have connective tissue disease associated UIP and was referred to the [facility] Lung Transplant center.  She was found to have Hepatitis C in 2015 and her transplant evaluation has been held further as of 06/2014.   02/07/2016  3 Month Follow Up For ILD: Pt. Presents for follow up. She was at [facility] Monday and Tuesday of this week for additional  transplantation work up. She is scheduled for a colonoscopy 02/28/2016 to remove some polyps, if there is no cancer, will move on to in patient pulmonary rehab. She had further decline in her PFT's, however she does not feel like she has declined functionally. No significant increase in her oxygen requirements.She remains on 4-6 L at rest. She sleeps on 4 L. With exercise she titrates up  Between  8-10 L for bike and new step, and for tread mill 15 L. She would like to have the Simply Go POC for the pulsed 6L., for better oxygen delivery while at work.. She has weaned herself down to 5 mg Prednisone daily, and remains on Imuran 100 mg, Bactrim 3 days weekly. She weaned the prednisone to 5 mg to lose weight for the transplant. [facility] have asked her to lose an additional 5 pounds, and start fund raising the $15,000.00. She has had no exacerbations, a stable 3 month interval. She has not missed a single day of work. She needs to continue working for insurance coverage of transplant. She denies fever, chest pain, purulent sputum, orthopnea or hemoptysis. She has not had flu shot yet this year.  8/28-8/29: [facility] Transplantation Visit Work Up 6 minute walk>> walked 9 minutes titrated up to 15L PFT's, LFT's completed at this visit. 02/04/2016:  FVC: 2.78 ( 45%) DLCO:  3.0  Tests  2007 CXR > question ILD 08/2013 CXR > clear progression of interstitial infiltrate and volume loss 08/2013 cXR > pneumonia 10/06/2013 CT chest (Entrikin read) > compatible with UIP, mildly enlarged right paratracheal lymph node, likely old granulomatous disease, mild cardiomegaly 10/06/2013 pulmonary function test [facility]> flow volume loop compatible with obstruction, ratio 77%, FEV1 0.83 L (39% predicted, -4% change with bronchodilator), unable to perform lung volumes, DLCO 9.3 (54% predicted) 12/09/2014 6MW > 990 feet, O2 saturation 80% on 2L > improved with 3L 01/2014 ANA neg, RF positive 44.7, SCL-70 neg, SSA neg, SSB neg, CCP negative, adolase normal, CRP 0.35 (normal) 02/2014 Started prednisone, referred to [place] [facility] 03/01/2014 6MW 900 feet, dropped to 79% on 3L [place] 03/09/2014 Ratio 77%, FEV1 0.70L (33% pred), FVC 0.91L (32% pred), DLCO 5.4 (31% pred) 08/08/2014 PFT> Ratio 63% post (but 78% pre), FEV1 0.74L (39% pred), FVC 1.18L (48% pred),  08/2015 PFT> Ratio FVC 1.18 L (42% pre), TLC 1.71L (36% pred), DLCO 5.2 (29% pred) 08/2015 ambulatory walking test> walked for 9 minutes, titrated O2 to 15L [place]  Past medical hx Past Medical History:  Diagnosis Date  . Abnormal hemoglobin (HCC)   . Allergy   . Anxiety   . Asthma   . Controlled insomnia   . Erythrocytosis   . GERD (gastroesophageal reflux disease)   . Hep C w/o coma, chronic ([place])   . Hypertension   . IBS (irritable bowel syndrome)   .  Lumbago   . Mitral valve prolapse   . Oxygen dependent   . Plantar wart   . Pulmonary fibrosis ([place])   . Scleroderma ([place])    Dr. Pennie Banter  . Seasonal allergies   . Spondylolisthesis   . Symptomatic menopausal or female climacteric states      Past surgical hx, Family hx, Social hx all reviewed.  Current Outpatient Prescriptions on File Prior to Visit  Medication Sig  . ADVAIR DISKUS 500-50 MCG/DOSE AEPB INHALE 1 PUFF INTO THE LUNGS 2 (TWO) TIMES DAILY.  Marland Kitchen albuterol  (PROVENTIL) (2.5 MG/3ML) 0.083% nebulizer solution Take 3 mLs (2.5 mg total) by nebulization every 4 (four) hours as needed for wheezing or shortness of breath. DX Code: J84.10  . ALPRAZolam (XANAX) 0.5 MG tablet TAKE 1 TABLET BY MOUTH 3 TIMES A DAY AS NEEDED FOR ANXIETY  . azaTHIOprine (IMURAN) 50 MG tablet Take 2 tablets (100 mg total) by mouth daily. Daily  . cholecalciferol (VITAMIN D) 1000 UNITS tablet Take 2,000 Units by mouth daily.  Marland Kitchen diltiazem (CARDIZEM) 60 MG tablet TAKE 1 TABLET (60 MG TOTAL) BY MOUTH DAILY.  . fluconazole (DIFLUCAN) 150 MG tablet TAKE 1 TABLET WEEKLY AS NEEDED FOR THRUSH  . fluticasone (FLONASE) 50 MCG/ACT nasal spray Place 2 sprays into both nostrils daily.  Marland Kitchen loratadine (CLARITIN) 10 MG tablet TAKE 1 TABLET (10 MG TOTAL) BY MOUTH 2 (TWO) TIMES DAILY.  Marland Kitchen nystatin (MYCOSTATIN) 100000 UNIT/ML suspension TAKE 1 TABLESPOON BY MOUTH BEFORE MEALS AND AT BEDTIME (SWISH AND SPIT OUT) (Patient taking differently: TAKE 1 TABLESPOON BY MOUTH BEFORE MEALS AND AT BEDTIME (SWISH AND SPIT OUT) as needed)  . omeprazole (PRILOSEC) 40 MG capsule TAKE 1 CAPSULE TWICE DAILY  . PEG 3350-KCl-NaBcb-NaCl-NaSulf (PEG-3350/ELECTROLYTES) 236 g SOLR Take by mouth.  . predniSONE (DELTASONE) 10 MG tablet Take 1 tablet (10 mg total) by mouth daily with breakfast.  . sulfamethoxazole-trimethoprim (BACTRIM DS,SEPTRA DS) 800-160 MG tablet TAKE 1 TABLET BY MOUTH 3 (THREE) TIMES A WEEK.  . VENTOLIN HFA 108 (90 Base) MCG/ACT inhaler INHALE 2 PUFFS INTO THE LUNGS EVERY 4 (FOUR) HOURS AS NEEDED FOR WHEEZING OR SHORTNESS OF BREATH.  . vitamin B-12 (CYANOCOBALAMIN) 250 MCG tablet Place 250 mcg under the tongue every other day.    No current facility-administered medications on file prior to visit.      No Known Allergies  Review Of Systems:  Constitutional:   +  weight loss ( 3 pounds) , night sweats,  Fevers, chills, fatigue, or  lassitude.  HEENT:   No headaches,  Difficulty swallowing,  Tooth/dental  problems, or  Sore throat,                + sneezing, itching, ear ache, +nasal congestion,+ post nasal drip,   CV:  No chest pain,  Orthopnea, PND, swelling in lower extremities, anasarca, dizziness, palpitations, syncope.   GI  No heartburn, indigestion, abdominal pain, nausea, vomiting, diarrhea, change in bowel habits, loss of appetite, bloody stools.   Resp: + shortness of breath with exertion and at rest.  No excess mucus, no productive cough,  No non-productive cough,  No coughing up of blood.  No change in color of mucus.  No wheezing.  No chest wall deformity  Skin: no rash or lesions.  GU: no dysuria, change in color of urine, no urgency or frequency.  No flank pain, no hematuria   Erika:  No joint pain or swelling.  No decreased range of motion.  No back pain.  Psych:  No change in mood or affect. No depression or anxiety.  No memory loss.   Vital Signs BP 136/82 (BP Location: Left Arm, Cuff Size: Normal)   Pulse (!) 108   Ht 5\' 1"  (1.549 m)   Wt 147 lb 9.6 oz (67 kg)   SpO2 95%   BMI 27.89 kg/m    Physical Exam:  General- No distress,  A&Ox3, pleasant wearing nasal oxygen ENT: No sinus tenderness, TM clear, pale nasal mucosa, no oral exudate,no post nasal drip, no LAN Cardiac: S1, S2, regular rate and rhythm, no murmur Chest: No wheeze/ = crackles,/ dullness; diminished per bases bilaterally, + accessory muscle use, no nasal flaring, no sternal retractions Abd.: Soft Non-tender Ext: No clubbing cyanosis, edema Neuro:  normal strength Skin: No rashes, warm and dry Psych: normal mood and behavior   Assessment/Plan  Postinflammatory pulmonary fibrosis (HCC) Progressive IPF per PFT's No significant functional status change  Plan: Continue to take your prednisone 5 mg daily Continue to take your Immuran at 100 mg daily. Continue Bactrim 3 days per week. We will request that you are evaluated for the Respironics Simply Go POC. Use your home concentrator at 8-10 L  as needed for vigorous exercise. Vigorous exercise 3 times weekly with lower intensity exercise on your off days. Continue working on your weight loss as [facility] has requested. LFT's were done at [facility] this week  When you were there, they are good. Follow up with Dr. Lake Bells in 3 months with LFT's before. Please contact office for sooner follow up if symptoms do not improve or worsen or seek emergency care      Magdalen Spatz, NP 02/07/2016  2:35 PM

## 2016-02-07 NOTE — Assessment & Plan Note (Addendum)
Progressive IPF per PFT's No significant functional status change  Plan: Continue to take your prednisone 5 mg daily Continue to take your Immuran at 100 mg daily. Continue Bactrim 3 days per week. We will request that you are evaluated for the Respironics Simply Go POC. Use your home concentrator at 8-10 L as needed for vigorous exercise. Vigorous exercise 3 times weekly with lower intensity exercise on your off days. Continue working on your weight loss as [facility] has requested. LFT's were done at [facility] this week  When you were there, they are good. Follow up with Dr. Lake Bells in 3 months with LFT's before. Please contact office for sooner follow up if symptoms do not improve or worsen or seek emergency care

## 2016-02-07 NOTE — Progress Notes (Signed)
Subjective:    Patient ID: Erika Martinez, female    DOB: 08/08/1955, 60 y.o.   MRN: [id number]  Synopsis: Erika Martinez has connective tissue disease associated UIP and she first saw [place] pulmonary in 2015 after a hospitalization for hypoxemic respiratory failure.  She was found to have connective tissue disease associated UIP and was referred to the [facility] Lung Transplant center.  She was found to have Hepatitis C in 2015 and her transplant evaluation has been held further as of 06/2014.  2007 CXR > question ILD 08/2013 CXR > clear progression of interstitial infiltrate and volume loss 08/2013 cXR > pneumonia 10/06/2013 CT chest ([place]rikin read) > compatible with UIP, mildly enlarged right paratracheal lymph node, likely old granulomatous disease, mild cardiomegaly 10/06/2013 pulmonary function test [facility]> flow volume loop compatible with obstruction, ratio 77%, FEV1 0.83 L (39% predicted, -4% change with bronchodilator), unable to perform lung volumes, DLCO 9.3 (54% predicted) 12/09/2014 6MW > 990 feet, O2 saturation 80% on 2L > improved with 3L 01/2014 ANA neg, RF positive 44.7, SCL-70 neg, SSA neg, SSB neg, CCP negative, adolase normal, CRP 0.35 (normal) 02/2014 Started prednisone, referred to [place] [facility] clinic 03/01/2014 6MW 900 feet, dropped to 79% on 3L [place] 03/09/2014 Ratio 77%, FEV1 0.70L (33% pred), FVC 0.91L (32% pred), DLCO 5.4 (31% pred) 08/08/2014 PFT> Ratio 63% post (but 78% pre), FEV1 0.74L (39% pred), FVC 1.18L (48% pred),  08/2015 PFT> Ratio FVC 1.18 L (42% pre), TLC 1.71L (36% pred), DLCO 5.2 (29% pred) 08/2015 ambulatory walking test> walked for 9 minutes, titrated O2 to 15L [place]   HPI Chief Complaint  Patient presents with  . Follow-up    pt had testing at [facility] earlier this week- c/o sob with exertion.       Past Medical History:  Diagnosis Date  . Abnormal hemoglobin (HCC)   . Allergy   . Anxiety   . Asthma   . Controlled insomnia   . Erythrocytosis   . GERD  (gastroesophageal reflux disease)   . Hep C w/o coma, chronic ([place])   . Hypertension   . IBS (irritable bowel syndrome)   . Lumbago   . Mitral valve prolapse   . Oxygen dependent   . Plantar wart   . Pulmonary fibrosis ([place])   . Scleroderma ([place])    Dr. Pennie Banter  . Seasonal allergies   . Spondylolisthesis   . Symptomatic menopausal or female climacteric states      Review of Systems  Constitutional: Negative for chills, fatigue and fever.  HENT: Negative for postnasal drip, rhinorrhea and sinus pressure.   Respiratory: Positive for shortness of breath. Negative for cough and wheezing.   Cardiovascular: Negative for chest pain, palpitations and leg swelling.       Objective:   Physical Exam  Vitals:   02/07/16 1337  BP: 136/82  BP Location: Left Arm  Cuff Size: Normal  Pulse: (!) 108  SpO2: 95%  Weight: 147 lb 9.6 oz (67 kg)  Height: 5\' 1"  (1.549 m)  2L [place]   Gen: well appearing, no acute distress HEENT: NCAT, EOMi, OP clear, cushingoid faces PULM: Crackles bilateral bases, normal effort CV: RRR, no mgr, no JVD AB: BS+, soft, nontender, no hsm Ext: warm, no edema, + clubbing, no cyanosis Derm: no rash or skin breakdown    CMP Latest Ref Rng & Units 11/06/2015 10/02/2015 09/07/2015  Glucose 65 - 99 mg/dL - - -  BUN 6 - 20 mg/dL - - -  Creatinine 0.44 - 1.00 mg/dL - - -  Sodium 135 - 145 mmol/L - - -  Potassium 3.5 - 5.1 mmol/L - - -  Chloride 101 - 111 mmol/L - - -  CO2 22 - 32 mmol/L - - -  Calcium 8.9 - 10.3 mg/dL - - -  Total Protein 6.5 - 8.1 g/dL 7.6 7.8 7.7  Total Bilirubin 0.3 - 1.2 mg/dL 0.4 0.4 0.3  Alkaline Phos 38 - 126 U/L 63 52 61  AST 15 - 41 U/L 25 26 24   ALT 14 - 54 U/L 25 27 26         Assessment & Plan:   No problem-specific Assessment & Plan notes found for this encounter. > 25 minutes spent today in clinic with Erika Martinez, 50% of which was face to face  Updated Medication List Outpatient Encounter Prescriptions as of 02/07/2016   Medication Sig  . ADVAIR DISKUS 500-50 MCG/DOSE AEPB INHALE 1 PUFF INTO THE LUNGS 2 (TWO) TIMES DAILY.  Marland Kitchen albuterol (PROVENTIL) (2.5 MG/3ML) 0.083% nebulizer solution Take 3 mLs (2.5 mg total) by nebulization every 4 (four) hours as needed for wheezing or shortness of breath. DX Code: J84.10  . ALPRAZolam (XANAX) 0.5 MG tablet TAKE 1 TABLET BY MOUTH 3 TIMES A DAY AS NEEDED FOR ANXIETY  . azaTHIOprine (IMURAN) 50 MG tablet Take 2 tablets (100 mg total) by mouth daily. Daily  . cholecalciferol (VITAMIN D) 1000 UNITS tablet Take 2,000 Units by mouth daily.  Marland Kitchen diltiazem (CARDIZEM) 60 MG tablet TAKE 1 TABLET (60 MG TOTAL) BY MOUTH DAILY.  . fluconazole (DIFLUCAN) 150 MG tablet TAKE 1 TABLET WEEKLY AS NEEDED FOR THRUSH  . fluticasone (FLONASE) 50 MCG/ACT nasal spray Place 2 sprays into both nostrils daily.  Marland Kitchen loratadine (CLARITIN) 10 MG tablet TAKE 1 TABLET (10 MG TOTAL) BY MOUTH 2 (TWO) TIMES DAILY.  Marland Kitchen nystatin (MYCOSTATIN) 100000 UNIT/ML suspension TAKE 1 TABLESPOON BY MOUTH BEFORE MEALS AND AT BEDTIME (SWISH AND SPIT OUT) (Patient taking differently: TAKE 1 TABLESPOON BY MOUTH BEFORE MEALS AND AT BEDTIME (SWISH AND SPIT OUT) as needed)  . omeprazole (PRILOSEC) 40 MG capsule TAKE 1 CAPSULE TWICE DAILY  . PEG 3350-KCl-NaBcb-NaCl-NaSulf (PEG-3350/ELECTROLYTES) 236 g SOLR Take by mouth.  . predniSONE (DELTASONE) 10 MG tablet Take 1 tablet (10 mg total) by mouth daily with breakfast.  . sulfamethoxazole-trimethoprim (BACTRIM DS,SEPTRA DS) 800-160 MG tablet TAKE 1 TABLET BY MOUTH 3 (THREE) TIMES A WEEK.  . VENTOLIN HFA 108 (90 Base) MCG/ACT inhaler INHALE 2 PUFFS INTO THE LUNGS EVERY 4 (FOUR) HOURS AS NEEDED FOR WHEEZING OR SHORTNESS OF BREATH.  . vitamin B-12 (CYANOCOBALAMIN) 250 MCG tablet Place 250 mcg under the tongue every other day.    No facility-administered encounter medications on file as of 02/07/2016.      Attending:  I have seen and examined the patient with Erika Martinez and I agree with  the findings from her note  Breathing a little worse, weight about the same Lung function down at [facility] this week Tolerating Imuran Currently on prednisone 5 mg  On exam: Lungs surprisingly clear once again, normal effort, weight appears to be up [place]  Interstitial lung disease likely related to a connective tissue disease: Continue Imuran and prednisone at lower dose, proceed towards lung transplant at [facility] Chronic respiratory failure with hypoxemia: We will order a new portable concentrator for her to go up to 6 L/m pulse Obesity: Advised at length today to exercise frequently, keep dose of prednisone low  Ruby Cola  Lake Bells, MD [place] [facility] Pager: [phone] Cell: [phone] After 3pm or if no response, call [phone]

## 2016-02-07 NOTE — Patient Instructions (Addendum)
It is nice to meet you today. Continue to take your prednisone 5 mg daily Continue to take your Immuran at 100 mg daily. Continue Bactrim 3 days per week. We will request that you are evaluated for the Respironics Simply Go POC. Use your home concentrator at 8-10 L as needed for vigorous exercise. Vigorous exercise 3 times weekly with lower intensity exercise on your off days. Continue working on your weight loss as [facility] has requested. LFT's were done at [facility] this week  When you were there, they are good. Follow up with Dr. Lake Bells in 3 months with LFT's before. Please contact office for sooner follow up if symptoms do not improve or worsen or seek emergency care

## 2016-02-08 ENCOUNTER — Encounter: Payer: 59 | Attending: Pulmonary Disease

## 2016-02-08 DIAGNOSIS — J8410 Pulmonary fibrosis, unspecified: Secondary | ICD-10-CM | POA: Insufficient documentation

## 2016-02-08 NOTE — Telephone Encounter (Signed)
Patient requesting refill of Nystatin be sent to [id number].

## 2016-02-12 ENCOUNTER — Encounter: Payer: Self-pay | Admitting: *Deleted

## 2016-02-12 DIAGNOSIS — J8410 Pulmonary fibrosis, unspecified: Secondary | ICD-10-CM

## 2016-02-12 NOTE — Progress Notes (Signed)
Pulmonary Individual Treatment Plan  Patient Details  Name: Erika Martinez MRN: [id number] Date of Birth: August 31, 1955 Referring Provider:   April Manson Pulmonary Rehab from 12/14/2015 in [facility] Cardiac and Pulmonary Rehab  Referring Provider  McQuaid      Initial Encounter Date:  Flowsheet Row Pulmonary Rehab from 12/14/2015 in [facility] Cardiac and Pulmonary Rehab  Date  12/14/15  Referring Provider  McQuaid      Visit Diagnosis: Pulmonary fibrosis ([employer])  Patient's Home Medications on Admission:  Current Outpatient Prescriptions:  .  ADVAIR DISKUS 500-50 MCG/DOSE AEPB, INHALE 1 PUFF INTO THE LUNGS 2 (TWO) TIMES DAILY., Disp: 60 each, Rfl: 5 .  albuterol (PROVENTIL) (2.5 MG/3ML) 0.083% nebulizer solution, Take 3 mLs (2.5 mg total) by nebulization every 4 (four) hours as needed for wheezing or shortness of breath. DX Code: J84.10, Disp: 75 mL, Rfl: 12 .  ALPRAZolam (XANAX) 0.5 MG tablet, TAKE 1 TABLET BY MOUTH 3 TIMES A DAY AS NEEDED FOR ANXIETY, Disp: 30 tablet, Rfl: 2 .  azaTHIOprine (IMURAN) 50 MG tablet, Take 2 tablets (100 mg total) by mouth daily. Daily, Disp: 60 tablet, Rfl: 5 .  cholecalciferol (VITAMIN D) 1000 UNITS tablet, Take 2,000 Units by mouth daily., Disp: , Rfl:  .  diltiazem (CARDIZEM) 60 MG tablet, TAKE 1 TABLET (60 MG TOTAL) BY MOUTH DAILY., Disp: 30 tablet, Rfl: 2 .  fluconazole (DIFLUCAN) 150 MG tablet, TAKE 1 TABLET WEEKLY AS NEEDED FOR THRUSH, Disp: 4 tablet, Rfl: 2 .  fluticasone (FLONASE) 50 MCG/ACT nasal spray, Place 2 sprays into both nostrils daily., Disp: 16 g, Rfl: 6 .  loratadine (CLARITIN) 10 MG tablet, TAKE 1 TABLET (10 MG TOTAL) BY MOUTH 2 (TWO) TIMES DAILY., Disp: 60 tablet, Rfl: 3 .  nystatin (MYCOSTATIN) 100000 UNIT/ML suspension, TAKE 1 TABLESPOON BY MOUTH BEFORE MEALS AND AT BEDTIME (SWISH AND SPIT OUT), Disp: 473 mL, Rfl: 2 .  omeprazole (PRILOSEC) 40 MG capsule, TAKE 1 CAPSULE TWICE DAILY, Disp: 60 capsule, Rfl: 2 .  PEG 3350-KCl-NaBcb-NaCl-NaSulf  (PEG-3350/ELECTROLYTES) 236 g SOLR, Take by mouth., Disp: , Rfl:  .  predniSONE (DELTASONE) 10 MG tablet, Take 1 tablet (10 mg total) by mouth daily with breakfast., Disp: 30 tablet, Rfl: 2 .  sulfamethoxazole-trimethoprim (BACTRIM DS,SEPTRA DS) 800-160 MG tablet, TAKE 1 TABLET BY MOUTH 3 (THREE) TIMES A WEEK., Disp: , Rfl: 5 .  VENTOLIN HFA 108 (90 Base) MCG/ACT inhaler, INHALE 2 PUFFS INTO THE LUNGS EVERY 4 (FOUR) HOURS AS NEEDED FOR WHEEZING OR SHORTNESS OF BREATH., Disp: 18 g, Rfl: 5 .  vitamin B-12 (CYANOCOBALAMIN) 250 MCG tablet, Place 250 mcg under the tongue every other day. , Disp: , Rfl:   Past Medical History: Past Medical History:  Diagnosis Date  . Abnormal hemoglobin (HCC)   . Allergy   . Anxiety   . Asthma   . Controlled insomnia   . Erythrocytosis   . GERD (gastroesophageal reflux disease)   . Hep C w/o coma, chronic ([place])   . Hypertension   . IBS (irritable bowel syndrome)   . Lumbago   . Mitral valve prolapse   . Oxygen dependent   . Plantar wart   . Pulmonary fibrosis ([place])   . Scleroderma ([place])    Dr. Pennie Banter  . Seasonal allergies   . Spondylolisthesis   . Symptomatic menopausal or female climacteric states     Tobacco Use: History  Smoking Status  . Never Smoker  Smokeless Tobacco  . Never Used    Labs: Recent  Review Flowsheet Data    Labs for ITP Cardiac and Pulmonary Rehab Latest Ref Rng & Units 07/14/2013 07/05/2015 08/02/2015   Cholestrol 100 - 199 mg/dL 169 - 196   LDLCALC 0 - 99 mg/dL 95 - 112(H)   HDL >39 mg/dL 58 - 68   Trlycerides 0 - 149 mg/dL 78 - 78   Hemoglobin A1c 4.8 - 5.6 % - 6.3(H) -       ADL UCSD:     Pulmonary Assessment Scores    Row Name 12/14/15 1350         ADL UCSD   ADL Phase Entry     SOB Score total 43     Rest 0     Walk 2     Stairs 4     Bath 1     Dress 1     Shop 2        Pulmonary Function Assessment:   Exercise Target Goals:    Exercise Program Goal: Individual exercise prescription set  with THRR, safety & activity barriers. Participant demonstrates ability to understand and report RPE using BORG scale, to self-measure pulse accurately, and to acknowledge the importance of the exercise prescription.  Exercise Prescription Goal: Starting with aerobic activity 30 plus minutes a day, 3 days per week for initial exercise prescription. Provide home exercise prescription and guidelines that participant acknowledges understanding prior to discharge.  Activity Barriers & Risk Stratification:     Activity Barriers & Cardiac Risk Stratification - 12/14/15 1204      Activity Barriers & Cardiac Risk Stratification   Activity Barriers Deconditioning;Joint Problems      6 Minute Walk:     6 Minute Walk    Row Name 12/14/15 1204 12/14/15 1216 12/14/15 1301     6 Minute Walk   Phase Initial  -  -   Distance 810 feet  -  -   Walk Time 5.81 minutes  -  -   # of Rest Breaks 1  -  -   RPE 15  -  -   Perceived Dyspnea  3  "tired " on 8 liters oxgyen for 6 minute walk -   Pulse ox down to 82% at end of 6 minute walk even on 8 liters of supplemental oxygen. Pulse ox came up to 92% with rest.  3   Resting HR 80 bpm  -  -   Resting BP 122/70  -  -   Max Ex. HR 118 bpm  -  -   Max Ex. BP 138/70  -  -   Row Name 12/14/15 1302 12/14/15 1312       6 Minute Walk   Phase Initial Initial       Initial Exercise Prescription:     Initial Exercise Prescription - 12/14/15 1400      Date of Initial Exercise RX and Referring Provider   Date 12/14/15   Referring Provider McQuaid     Oxygen   Oxygen Continuous   Liters 4     Treadmill   MPH 1.4   Grade 1   Minutes 15   METs 2.26     NuStep   Level 2   Minutes 15   METs 2.3     REL-XR   Level 1   Minutes 15   METs 2.3     Prescription Details   Frequency (times per week) 3   Duration Progress to 30 minutes of continuous aerobic without signs/symptoms  of physical distress     Intensity   THRR 40-80% of Max  Heartrate 111-142   Ratings of Perceived Exertion 11-13   Perceived Dyspnea 0-4     Progression   Progression Continue to progress workloads to maintain intensity without signs/symptoms of physical distress.     Resistance Training   Training Prescription Yes   Weight 2   Reps 10-15      Perform Capillary Blood Glucose checks as needed.  Exercise Prescription Changes:     Exercise Prescription Changes    Row Name 12/26/15 1400 01/09/16 1300 01/23/16 1400 02/06/16 1300       Exercise Review   Progression Yes Yes Yes Yes      Response to Exercise   Blood Pressure (Admit) 110/60 118/64 112/66 120/66    Blood Pressure (Exercise) 126/64 138/64 150/84 124/74    Blood Pressure (Exit) 108/50 140/70 124/62 104/62    Heart Rate (Admit) 106 bpm 114 bpm 110 bpm 94 bpm    Heart Rate (Exercise) 115 bpm 120 bpm 118 bpm 112 bpm    Heart Rate (Exit) 104 bpm 100 bpm 108 bpm 100 bpm    Oxygen Saturation (Admit) 96 % 89 % 97 % 96 %    Oxygen Saturation (Exercise) 92 % 90 % 93 % 92 %    Oxygen Saturation (Exit) 94 % 96 % 100 % 95 %    Rating of Perceived Exertion (Exercise) '13 13 13 13    ' Perceived Dyspnea (Exercise) '3 3 3 3    ' Comments  - Home Exercise Guidelines given 01/09/16  -  -      Progression   Progression Continue to progress workloads to maintain intensity without signs/symptoms of physical distress. Continue to progress workloads to maintain intensity without signs/symptoms of physical distress. Continue to progress workloads to maintain intensity without signs/symptoms of physical distress. Continue to progress workloads to maintain intensity without signs/symptoms of physical distress.      Resistance Training   Training Prescription Yes Yes Yes Yes    Weight 2 6  exercise O2 6-8L 3 3    Reps 10-15 10-15 10-15 10-15      Interval Training   Interval Training  -  - No  -      Oxygen   Oxygen Continuous  - Continuous  -    Liters '4 2 8 8  15 ' L/min on TM      Treadmill    MPH 1.2 1.2 1.2 1.2    Grade 0 0.5 0 0    Minutes '10 10 12 25    ' METs  - 2 1.92 1.92      NuStep   Level  - 2  -  -    Minutes  - 15  -  -    METs  - 1.5  -  -      REL-XR   Level '3 3 3 4    ' Minutes '15 15 15 15    ' METs 1.9 2.1  - 3      Biostep-RELP   Level  - 2  -  -    Minutes  - 15  -  -    METs  - 2  -  -      Home Exercise Plan   Plans to continue exercise at  - Home  Stationary bike  -  -    Frequency  - Add 4 additional days to program exercise sessions.  -  -  Exercise Comments:     Exercise Comments    Row Name 12/14/15 1217 12/19/15 1337 01/09/16 1312 01/09/16 1356 01/23/16 1423   Exercise Comments  Pulse ox down to 82% at end of 6 minute walk even on 8 liters of supplemental oxygen. Pulse ox came up to 92% with rest.  First full day of exercise!  Patient was oriented to gym and equipment including functions, settings, policies, and procedures.  Patient's individual exercise prescription and treatment plan were reviewed.  All starting workloads were established based on the results of the 6 minute walk test done at initial orientation visit.  The plan for exercise progression was also introduced and progression will be customized based on patient's performance and goals. Reviewed home exercise with pt today.  Pt plans to continue riding stationary bike at home  for exercise.  We discussed her progression with exercise at home to increase her current time. Reviewed THR, pulse, RPE, sign and symptoms, and when to call 911 or MD.  Also discussed weather considerations and indoor options.  Pt voiced understanding. Erika Martinez is continuing to improve her intensity and duration of exercise. Erika Martinez continues to progress well with exercise.   Row Name 02/06/16 1329 02/06/16 1605         Exercise Comments [place] is progressing well with exercise.  She has added resistnace on the XR and more time to TM. Erika Brammer had an oxygen titration [facility] at [facility] for her Lung Transplant  surgery, so we will not perform a Mid42md         Discharge Exercise Prescription (Final Exercise Prescription Changes):     Exercise Prescription Changes - 02/06/16 1300      Exercise Review   Progression Yes     Response to Exercise   Blood Pressure (Admit) 120/66   Blood Pressure (Exercise) 124/74   Blood Pressure (Exit) 104/62   Heart Rate (Admit) 94 bpm   Heart Rate (Exercise) 112 bpm   Heart Rate (Exit) 100 bpm   Oxygen Saturation (Admit) 96 %   Oxygen Saturation (Exercise) 92 %   Oxygen Saturation (Exit) 95 %   Rating of Perceived Exertion (Exercise) 13   Perceived Dyspnea (Exercise) 3     Progression   Progression Continue to progress workloads to maintain intensity without signs/symptoms of physical distress.     Resistance Training   Training Prescription Yes   Weight 3   Reps 10-15     Oxygen   Liters 8  15 L/min on TM     Treadmill   MPH 1.2   Grade 0   Minutes 25   METs 1.92     REL-XR   Level 4   Minutes 15   METs 3       Nutrition:  Target Goals: Understanding of nutrition guidelines, daily intake of sodium <15058m cholesterol <2002mcalories 30% from fat and 7% or less from saturated fats, daily to have 5 or more servings of fruits and vegetables.  Biometrics:     Pre Biometrics - 12/14/15 1321      Pre Biometrics   Height 5' 1.2" (1.554 m)   Weight 151 lb 11.2 oz (68.8 kg)   Waist Circumference 42 inches   Hip Circumference 39.8 inches   Waist to Hip Ratio 1.06 %   BMI (Calculated) 28.5   Single Leg Stand 30 seconds       Nutrition Therapy Plan and Nutrition Goals:     Nutrition Therapy & Goals - 12/14/15  [place] wants to meet with the Registered Dietician to help her lose weight. Erika Martinez has cut out bread and sodas.       Nutrition Discharge: Rate Your Plate Scores:   Psychosocial: Target Goals: Acknowledge presence or absence of depression, maximize coping skills, provide positive  support system. Participant is able to verbalize types and ability to use techniques and skills needed for reducing stress and depression.  Initial Review & Psychosocial Screening:     Initial Psych Review & Screening - 12/14/15 [place]? Yes      Quality of Life Scores:     Quality of Life - 12/14/15 1323      Quality of Life Scores   Health/Function Pre 20.69 %   Socioeconomic Pre 20.38 %   Psych/Spiritual Pre 21 %   Family Pre 20 %   GLOBAL Pre 20.58 %      PHQ-9: Recent Review Flowsheet Data    Depression screen [facility] 2/9 12/14/2015 10/03/2015 07/03/2015 04/06/2015 03/14/2015   Decreased Interest 1 0 0 0 0   Down, Depressed, Hopeless 1 0 0 0 0   PHQ - 2 Score 2 0 0 0 0   Altered sleeping 1 - - - -   Tired, decreased energy 2 - - - -   Change in appetite 2 - - - -   Feeling bad or failure about yourself  1 - - - -   Trouble concentrating 0 - - - -   Moving slowly or fidgety/restless 0 - - - -   Suicidal thoughts 0 - - - -   PHQ-9 Score 8 - - - -   Difficult doing work/chores Somewhat difficult - - - -      Psychosocial Evaluation and Intervention:     Psychosocial Evaluation - 01/07/16 1231      Psychosocial Evaluation & Interventions   Comments Counselor met with Erika. [place] today as she has reutrned to this program.   She is 60 years old and reports she is in the lung transplant process to see if she will be chosen.  Erika. Blayney has Pulmonary Fibrosis.  She has a strong support system with her sister and a daughter who live close by.  She is actively involved in her local church community.  Erika. Mantia states she sleeps "okay" with some nights needing to take a prescribed medication to help with her anxiety.  She states her appetite is good most of the time and she denies a history of depression, but some anxiety which is being treated.  Erika. 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  Erika. Shartzer goals for this program are to increase her stamina and strength.  Counselor will continue to follow with her during the course of this program.         Psychosocial Re-Evaluation:     Psychosocial Re-Evaluation    [place] Name 01/21/16 1655             Psychosocial Re-Evaluation   Comments Counselor follow up with Erika Martinez today.  She reports doing well and enjoying this program; including experiencing benefits of increased stamina.  She reports being approved for the lung transplant list and has had to go through multiple tests  and meet extensive criteria for this to occur.  Erika. Kal[place] reports this is stressful for her currently as well as school starting back in a few weeks.  Erika. [place] reports sleeping "okay" currently and hopes that will continue, although she has to wean off her medications that have helped her sleep PRN.  Counselor will continue to follow with Erika. [place] throughout the course of this program.          Education: Education Goals: Education classes will be provided on a weekly basis, covering required topics. Participant will state understanding/return demonstration of topics presented.  Learning Barriers/Preferences:     Learning Barriers/Preferences - 12/14/15 1204      Learning Barriers/Preferences   Learning Barriers Sight  wears reading glasses      Education Topics: Initial Evaluation Education: - Verbal, written and demonstration of respiratory meds, RPE/PD scales, oximetry and breathing techniques. Instruction on use of nebulizers and MDIs: cleaning and proper use, rinsing mouth with steroid doses and importance of monitoring MDI activations. Flowsheet Row Documentation from 03/14/2015 in [place]  Date  12/05/14  Educator  LB  Instruction Review Code  2- meets goals/outcomes      General Nutrition Guidelines/Fats and  Fiber: -Group instruction provided by verbal, written material, models and posters to present the general guidelines for heart healthy nutrition. Gives an explanation and review of dietary fats and fiber. Flowsheet Row Pulmonary Rehab from 02/01/2016 in [facility] Cardiac and Pulmonary Rehab  Date  12/31/15  Educator  CR  Instruction Review Code  2- meets goals/outcomes      Controlling Sodium/Reading Food Labels: -Group verbal and written material supporting the discussion of sodium use in heart healthy nutrition. Review and explanation with models, verbal and written materials for utilization of the food label. Flowsheet Row Pulmonary Rehab from 02/01/2016 in [facility] Cardiac and Pulmonary Rehab  Date  01/07/16  Educator  CR  Instruction Review Code  2- meets goals/outcomes      Exercise Physiology & Risk Factors: - Group verbal and written instruction with models to review the exercise physiology of the cardiovascular system and associated critical values. Details cardiovascular disease risk factors and the goals associated with each risk factor. Flowsheet Row Pulmonary Rehab from 02/01/2016 in [facility] Cardiac and Pulmonary Rehab  Date  12/26/15  Educator  Alberteen Sam and Nada Maclachlan   Instruction Review Code  2- meets goals/outcomes      Aerobic Exercise & Resistance Training: - Gives group verbal and written discussion on the health impact of inactivity. On the components of aerobic and resistive training programs and the benefits of this training and how to safely progress through these programs.   Flexibility, Balance, General Exercise Guidelines: - Provides group verbal and written instruction on the benefits of flexibility and balance training programs. Provides general exercise guidelines with specific guidelines to those with heart or lung disease. Demonstration and skill practice provided.   Stress Management: - Provides group verbal and written instruction about the health  risks of elevated stress, cause of high stress, and healthy ways to reduce stress. Flowsheet Row Pulmonary Rehab from 02/01/2016 in [facility] Cardiac and Pulmonary Rehab  Date  12/19/15  Educator  Kathreen Cornfield  Instruction Review Code  2- meets goals/outcomes      Depression: - Provides group verbal and written instruction on the correlation between heart/lung disease and depressed mood, treatment options, and the stigmas associated with seeking treatment.   Exercise & Equipment Safety: - Individual  verbal instruction and demonstration of equipment use and safety with use of the equipment. Flowsheet Row Pulmonary Rehab from 02/01/2016 in [facility] Cardiac and Pulmonary Rehab  Date  12/14/15  Educator  C. EnterkinRN  Instruction Review Code  1- partially meets, needs review/practice      Infection Prevention: - Provides verbal and written material to individual with discussion of infection control including proper hand washing and proper equipment cleaning during exercise session. Flowsheet Row Pulmonary Rehab from 02/01/2016 in [facility] Cardiac and Pulmonary Rehab  Date  12/14/15  Educator  C. EnterkinRN  Instruction Review Code  2- meets goals/outcomes      Falls Prevention: - Provides verbal and written material to individual with discussion of falls prevention and safety. Flowsheet Row Pulmonary Rehab from 02/01/2016 in [facility] Cardiac and Pulmonary Rehab  Date  12/14/15  Educator  C. [place]  Instruction Review Code  2- meets goals/outcomes      Diabetes: - Individual verbal and written instruction to review signs/symptoms of diabetes, desired ranges of glucose level fasting, after meals and with exercise. Advice that pre and post exercise glucose checks will be done for 3 sessions at entry of program.   Chronic Lung Diseases: - Group verbal and written instruction to review new updates, new respiratory medications, new advancements in procedures and treatments. Provide informative  websites and "800" numbers of self-education. Flowsheet Row Pulmonary Rehab from 02/01/2016 in [facility] Cardiac and Pulmonary Rehab  Date  12/24/15  Educator  LB  Instruction Review Code  2- meets goals/outcomes      Lung Procedures: - Group verbal and written instruction to describe testing methods done to diagnose lung disease. Review the outcome of test results. Describe the treatment choices: Pulmonary Function Tests, ABGs and oximetry. Flowsheet Row Documentation from 03/14/2015 in [place]  Date  01/12/15  Educator  sj  Instruction Review Code  2- meets goals/outcomes      Energy Conservation: - Provide group verbal and written instruction for methods to conserve energy, plan and organize activities. Instruct on pacing techniques, use of adaptive equipment and posture/positioning to relieve shortness of breath. Flowsheet Row Documentation from 03/14/2015 in [place]  Date  01/24/15  Educator  SW  Instruction Review Code  2- meets goals/outcomes      Triggers: - Group verbal and written instruction to review types of environmental controls: home humidity, furnaces, filters, dust mite/pet prevention, HEPA vacuums. To discuss weather changes, air quality and the benefits of nasal washing. Flowsheet Row Documentation from 03/14/2015 in [place]  Date  12/18/14  Educator  LB  Instruction Review Code  2- meets goals/outcomes      Exacerbations: - Group verbal and written instruction to provide: warning signs, infection symptoms, calling MD promptly, preventive modes, and value of vaccinations. Review: effective airway clearance, coughing and/or vibration techniques. Create an Sports administrator. Flowsheet Row Pulmonary Rehab from 02/01/2016 in [facility] Cardiac and Pulmonary Rehab  Date  01/21/16  Educator  LB  Instruction Review Code  2- meets goals/outcomes      Oxygen: -  Individual and group verbal and written instruction on oxygen therapy. Includes supplement oxygen, available portable oxygen systems, continuous and intermittent flow rates, oxygen safety, concentrators, and Medicare reimbursement for oxygen. Flowsheet Row Documentation from 03/14/2015 in [place]  Date  12/05/14  Educator  LB  Instruction Review Code  2- meets goals/outcomes  Respiratory Medications: - Group verbal and written instruction to review medications for lung disease. Drug class, frequency, complications, importance of spacers, rinsing mouth after steroid MDI's, and proper cleaning methods for nebulizers. Flowsheet Row Documentation from 03/14/2015 in [place]  Date  12/22/14  Educator  [place]  Instruction Review Code  2- meets goals/outcomes [Spacer and instructions given to pt. ]      AED/CPR: - Group verbal and written instruction with the use of models to demonstrate the basic use of the AED with the basic ABC's of resuscitation. Flowsheet Row Pulmonary Rehab from 02/01/2016 in [facility] Cardiac and Pulmonary Rehab  Date  01/11/16  Educator  CE  Instruction Review Code  2- meets goals/outcomes      Breathing Retraining: - Provides individuals verbal and written instruction on purpose, frequency, and proper technique of diaphragmatic breathing and pursed-lipped breathing. Applies individual practice skills. Flowsheet Row Pulmonary Rehab from 02/01/2016 in [facility] Cardiac and Pulmonary Rehab  Date  12/19/15  Educator  [facility]  Instruction Review Code  2- meets goals/outcomes      Anatomy and Physiology of the Lungs: - Group verbal and written instruction with the use of models to provide basic lung anatomy and physiology related to function, structure and complications of lung disease. Flowsheet Row Pulmonary Rehab from 02/01/2016 in [facility] Cardiac and Pulmonary Rehab  Date  02/01/16  Educator  [place]   Instruction Review Code  2- meets goals/outcomes      Heart Failure: - Group verbal and written instruction on the basics of heart failure: signs/symptoms, treatments, explanation of ejection fraction, enlarged heart and cardiomyopathy. Flowsheet Row Pulmonary Rehab from 02/01/2016 in [facility] Cardiac and Pulmonary Rehab  Date  01/25/16  Educator  CE  Instruction Review Code  2- meets goals/outcomes      Sleep Apnea: - Individual verbal and written instruction to review Obstructive Sleep Apnea. Review of risk factors, methods for diagnosing and types of masks and machines for OSA.   Anxiety: - Provides group, verbal and written instruction on the correlation between heart/lung disease and anxiety, treatment options, and management of anxiety. Flowsheet Row Documentation from 03/14/2015 in [place]  Date  01/31/15  Educator  [facility]  Instruction Review Code  2- Meets goals/outcomes      Relaxation: - Provides group, verbal and written instruction about the benefits of relaxation for patients with heart/lung disease. Also provides patients with examples of relaxation techniques. Flowsheet Row Pulmonary Rehab from 02/01/2016 in [facility] Cardiac and Pulmonary Rehab  Date  01/16/16  Educator  Kathreen Cornfield, [facility]  Instruction Review Code  2- Meets goals/outcomes      Knowledge Questionnaire Score:     Knowledge Questionnaire Score - 12/14/15 1324      Knowledge Questionnaire Score   Pre Score 9       Core Components/Risk Factors/Patient Goals at Admission:     Personal Goals and Risk Factors at Admission - 12/14/15 1209      Core Components/Risk Factors/Patient Goals on Admission    Weight Management Yes   Intervention Weight Management: Develop a combined nutrition and exercise program designed to reach desired caloric intake, while maintaining appropriate intake of nutrient and fiber, sodium and fats, and appropriate energy expenditure required  for the weight goal.;Weight Management: Provide education and appropriate resources to help participant work on and attain dietary goals.;Weight Management/Obesity: Establish reasonable short term and long term weight goals.;Obesity: Provide education and appropriate resources to help participant  work on and attain dietary goals.   Admit Weight --  On prednisone currently. Erika Martinez reports she needs to lose 10 lbs for her potential [facility]   Goal Weight: Short Term 140 lb (63.5 kg)   Goal Weight: Long Term 125 lb (56.7 kg)   Expected Outcomes Short Term: Continue to assess and modify interventions until short term weight is achieved;Weight Maintenance: Understanding of the daily nutrition guidelines, which includes 25-35% calories from fat, 7% or less cal from saturated fats, less than 239m cholesterol, less than 1.5gm of sodium, & 5 or more servings of fruits and vegetables daily;Weight Loss: Understanding of general recommendations for a balanced deficit meal plan, which promotes 1-2 lb weight loss per week and includes a negative energy balance of [phone] kcal/d;Understanding recommendations for meals to include 15-35% energy as protein, 25-35% energy from fat, 35-60% energy from carbohydrates, less than 2068mof dietary cholesterol, 20-35 gm of total fiber daily;Weight Gain: Understanding of general recommendations for a high calorie, high protein meal plan that promotes weight gain by distributing calorie intake throughout the day with the consumption for 4-5 meals, snacks, and/or supplements   Sedentary Yes   Intervention Provide advice, education, support and counseling about physical activity/exercise needs.;Develop an individualized exercise prescription for aerobic and resistive training based on initial evaluation findings, risk stratification, comorbidities and participant's personal goals.   Expected Outcomes Achievement of increased cardiorespiratory fitness and enhanced  flexibility, muscular endurance and strength shown through measurements of functional capacity and personal statement of participant.   Intervention Provide advice, education, support and counseling about physical activity/exercise needs.;Develop an individualized exercise prescription for aerobic and resistive training based on initial evaluation findings, risk stratification, comorbidities and participant's personal goals.   Expected Outcomes Achievement of increased cardiorespiratory fitness and enhanced flexibility, muscular endurance and strength shown through measurements of functional capacity and personal statement of participant.   Stress Yes   Intervention Offer individual and/or small group education and counseling on adjustment to heart disease, stress management and health-related lifestyle change. Teach and support self-help strategies.;Refer participants experiencing significant psychosocial distress to appropriate mental health specialists for further evaluation and treatment. When possible, include family members and significant others in education/counseling sessions.   Expected Outcomes Short Term: Participant demonstrates changes in health-related behavior, relaxation and other stress management skills, ability to obtain effective social support, and compliance with psychotropic medications if prescribed.;Long Term: Emotional wellbeing is indicated by absence of clinically significant psychosocial distress or social isolation.      Core Components/Risk Factors/Patient Goals Review:      Goals and Risk Factor Review    Row Name 12/19/15 1335 12/28/15 1330 01/07/16 1504 01/22/16 0735 01/29/16 0751     Core Components/Risk Factors/Patient Goals Review   Personal Goals Review Improve shortness of breath with ADL's;Develop more efficient breathing techniques such as purse lipped breathing and diaphragmatic breathing and practicing self-pacing with activity. Sedentary;Increase Strength  and Stamina Increase knowledge of respiratory medications and ability to use respiratory devices properly.;Improve shortness of breath with ADL's;Develop more efficient breathing techniques such as purse lipped breathing and diaphragmatic breathing and practicing self-pacing with activity. Sedentary;Increase Strength and Stamina;Weight Management/Obesity Develop more efficient breathing techniques such as purse lipped breathing and diaphragmatic breathing and practicing self-pacing with activity.;Improve shortness of breath with ADL's;Increase knowledge of respiratory medications and ability to use respiratory devices properly.;Increase Strength and Stamina;Sedentary   Review Reviewed pursed lip breathing technique with pt today to use during exercise and at home. Erika Martinez riding  her bike at home on off days from rehab.  She starting to notice an increase in her strength and stamina. Erika Martinez has a good understanding of her MDI's. She does have problems with thrush and her Advair. Educated Erika Martinez on her MDI's and SVN being equally important, and if good technique with a spacer is performed, an MDI's can be the same amount of medicine as a SVN. We discussed increasing her oxygen on the treadmill, so she can get her full 10 minutes accomplished with out low O2Sat's and increased shortness of breath. Even with PLB, the treadmill is a challenge. Erika Martinez received a letter from the transplant team. She is required to exercise 3 days/week in [facility] and also loss 7-10lbs. She plans to meet with the dietitian one on one for help with this weight loss. Erika Martinez meets with the transplant team on 02/04/16 and 02/05/16. She has worked her exercise goal on the TM to 66mns. and 1.277m. [facility] list rquirement for transplant lists 1.5-2.0 on the TM, so that will be her next goal. She has a good understaning of her MDI's and her shortness of breath - uses pacing , PLB, and adjusts her oxygen indepently for increased  exercise.   Expected Outcomes Pt will become independent in using pursed lip breathing to improve overall shortness of breath. DeTabethaill continue to attend exercise and educaiton classes to boost her functional capacity.  -  - Work towards increasing her spend on the TM to 1.66m49m      Core Components/Risk Factors/Patient Goals at Discharge (Final Review):      Goals and Risk Factor Review - 01/29/16 0751      Core Components/Risk Factors/Patient Goals Review   Personal Goals Review Develop more efficient breathing techniques such as purse lipped breathing and diaphragmatic breathing and practicing self-pacing with activity.;Improve shortness of breath with ADL's;Increase knowledge of respiratory medications and ability to use respiratory devices properly.;Increase Strength and Stamina;Sedentary   Review Erika BalDirussoets with the transplant team on 02/04/16 and 02/05/16. She has worked her exercise goal on the TM to 166m38m and 1.2mph566mu[facility]'s list rquirement for transplant lists 1.5-2.0 on the TM, so that will be her next goal. She has a good understaning of her MDI's and her shortness of breath - uses pacing , PLB, and adjusts her oxygen indepently for increased exercise.   Expected Outcomes Work towards increasing her spend on the TM to 1.66mph.87m   ITP Comments:     ITP Comments    Row Name 12/14/15 1215 12/14/15 1216 12/14/15 1221 12/14/15 1226 01/09/16 1321   ITP Comments taking xanax when she needs it. Juno Leontinats it is stressful about the possibility of a lung transplant and all the workup.   Pulse ox down to 82% at end of 6 minute walk even on 8 liters of supplemental oxygen. Pulse ox came up to 92% with rest.  Right femerol area is sore Erika Martinez reports from her heart cath last week and her right groin area and leg are bruised. Erika Martinez reports her right shoulder hurts from the position she had to be for [facility]  Erika Martinez wants to  meet with the Registered Dietician to help her lose weight. Erika Martinez has cut out bread and sodas. Increased Tevis from 6 to 8 liters of oxygen while on XR.  Increased Heidie to 15 liters oxygen using her own nasal cannula with a  reservoir while Shyler was on the treadmill.  Jailin was able to walk at a speed of 1.2 mph with 0.5% incline on the treadmill for 10 minutes without stopping.  This is the first time Catheryne has been able to do this.     [place] Name 02/06/16 1606           ITP Comments Erika Sabia had a PFT at [facility] with a DLCO of 17%. She also had an ABG with PO2 of 42. 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          Comments: 30 Day Review

## 2016-02-15 ENCOUNTER — Encounter: Payer: 59 | Admitting: Respiratory Therapy

## 2016-02-15 DIAGNOSIS — J8410 Pulmonary fibrosis, unspecified: Secondary | ICD-10-CM | POA: Diagnosis not present

## 2016-02-15 NOTE — Progress Notes (Signed)
Daily Session Note  Patient Details  Name: Erika Martinez MRN: [id number] Date of Birth: 1955/06/16 Referring Provider:   April Manson Pulmonary Rehab from 12/14/2015 in [facility] Cardiac and Pulmonary Rehab  Referring Provider  McQuaid      Encounter Date: 02/15/2016  Check In:     Session Check In - 02/15/16 1408      Check-In   Location [facility]-Cardiac & Pulmonary Rehab   Staff Present Frederich Cha, RRT, RCP, Respiratory Lennie Hummer, MA, ACSM RCEP, Exercise Physiologist   Supervising physician immediately available to respond to emergencies [name] immediately available ER MD   Physician(s) Dr. Corky Downs & McShane   Medication changes reported     No   Fall or balance concerns reported    No   Warm-up and Cool-down Performed on first and last piece of equipment   Resistance Training Performed Yes   VAD Patient? No     Pain Assessment   Currently in Pain? No/denies         Goals Met:  Proper associated with RPD/PD & O2 Sat Independence with exercise equipment Using PLB without cueing & demonstrates good technique Exercise tolerated well Strength training completed today  Goals Unmet:  Not Applicable  Comments: Pt able to follow exercise prescription today without complaint.  Will continue to monitor for progression.    Dr. Emily Filbert is Medical Director for [place] and [name].

## 2016-02-22 ENCOUNTER — Telehealth: Payer: Self-pay | Admitting: Pulmonary Disease

## 2016-02-22 DIAGNOSIS — J8410 Pulmonary fibrosis, unspecified: Secondary | ICD-10-CM

## 2016-02-22 NOTE — Telephone Encounter (Signed)
Estill Bamberg at [facility] states she will call patient to see if there is a particular reason she is wanting to change from OxyGo5 to Simply Go as they are just about the same.   In the meantime I will send message to BQ to advise if he had a medical reason for patient to be changed.  [facility] is aware BQ off until Monday 02-25-16.  BQ please advise.

## 2016-02-25 NOTE — Telephone Encounter (Signed)
Ask patient for explanation please.

## 2016-02-26 NOTE — Telephone Encounter (Signed)
Spoke with pt. States that she wants to change because the Simply Go because it offers continuous oxygen.  BQ - please advise if you are okay with writing this prescription. Thanks.

## 2016-02-26 NOTE — Telephone Encounter (Signed)
Yes

## 2016-02-27 ENCOUNTER — Ambulatory Visit: Payer: 59 | Admitting: Family Medicine

## 2016-02-27 ENCOUNTER — Other Ambulatory Visit: Payer: Self-pay | Admitting: Family Medicine

## 2016-02-27 NOTE — Progress Notes (Unsigned)
Pt was schedule for a appointment today @1 :40 For a follow up with Dr. Ancil Boozer. I was  informed she never miss Appointments therefore I decided to give her a call. Called pt to check in on her. She didn't answer, Left voicemail asking her to call us back to reschedule an appointment and or give [place] an update.    Bud Face, [place]

## 2016-02-27 NOTE — Telephone Encounter (Signed)
Order has been placed. Pt is aware. Nothing further was needed. 

## 2016-02-28 LAB — HM COLONOSCOPY

## 2016-02-29 ENCOUNTER — Encounter: Payer: Self-pay | Admitting: Family Medicine

## 2016-03-05 ENCOUNTER — Encounter: Payer: 59 | Admitting: *Deleted

## 2016-03-05 DIAGNOSIS — J8410 Pulmonary fibrosis, unspecified: Secondary | ICD-10-CM

## 2016-03-05 NOTE — Progress Notes (Signed)
Daily Session Note  Patient Details  Name: Erika Martinez MRN: [id number] Date of Birth: [birth year] Referring Provider:   April Manson Pulmonary Rehab from 12/14/2015 in [facility] Cardiac and Pulmonary Rehab  Referring Provider  McQuaid      Encounter Date: 03/05/2016  Check In:     Session Check In - 03/05/16 1231      Check-In   Location [facility]-Cardiac & Pulmonary Rehab   Staff Present Heath Lark, RN, BSN, CCRP;Katielynn Horan, RN, BSN;Laureen Owens Shark, BS, RRT, Respiratory Therapist   Supervising physician immediately available to respond to emergencies [name] immediately available ER MD   Physician(s) Dr. Alfred Levins and Dr. Joni Fears   Medication changes reported     No   Fall or balance concerns reported    No   Warm-up and Cool-down Performed as group-led instruction   Resistance Training Performed Yes     Pain Assessment   Currently in Pain? No/denies         Goals Met:  Proper associated with RPD/PD & O2 Sat Exercise tolerated well  Goals Unmet:  Not Applicable  Comments:     Dr. Emily Filbert is Medical Director for [place] and [name].

## 2016-03-05 NOTE — Progress Notes (Signed)
Daily Session Note  Patient Details  Name: Erika Martinez MRN: [id number] Date of Birth: 23-Oct-1955 Referring Provider:   April Manson Pulmonary Rehab from 12/14/2015 in [facility] Cardiac and Pulmonary Rehab  Referring Provider  McQuaid      Encounter Date: 03/05/2016  Check In:     Session Check In - 03/05/16 1231      Check-In   Location [facility]-Cardiac & Pulmonary Rehab   Staff Present Heath Lark, RN, BSN, CCRP;Carroll Enterkin, RN, BSN;Laureen Owens Shark, BS, RRT, Respiratory Therapist   Supervising physician immediately available to respond to emergencies [name] immediately available ER MD   Physician(s) Dr. Alfred Levins and Dr. Joni Fears   Medication changes reported     No   Fall or balance concerns reported    No   Warm-up and Cool-down Performed as group-led instruction   Resistance Training Performed Yes     Pain Assessment   Currently in Pain? No/denies         Goals Met:  Independence with exercise equipment Exercise tolerated well Personal goals reviewed Strength training completed today  Goals Unmet:  Not Applicable  Comments: Erika Martinez 60 y.o. female  Patient psychosocial assessment reveals no barriers to participation in Pulmonary Rehab. Psychosocial areas that are currently affecting patient's rehab experience include concerns about , financial resources,   And Work. Patient does continue to exhibit positive  coping skills to deal with her psychosocial concerns. Offered emotional support and reassurance. Patient does feel she is making progress toward Pulmonary Rehab goals. Patient reports her health and activity level has improved in the past 30 days as evidenced by patient's report of increased ability to manage her daily activities and return to work for an eight hour day..  Patient reports  feeling positive about current and projected progression in Pulmonary Rehab. After reviewing the patient's treatment plan, the patient is making progress toward Pulmonary  Rehab goals. Patient's rate of progress toward rehab goals is good. Plan of action to help patient continue to work towards rehab goals include expecting her to be able to attend program 3 days a week even though she has returned to work. Watching progress towards her ability to be placed on the transplant list. Brit has raised almost 9 thousand dollars, has stopped taking the Xanax and is close to her weight loss goal required to be placed on the list.  Will continue to monitor and evaluate progress toward psychosocial goal(s).  Goal(s) in progress: Improved management of stress, and anxiety Help patient work toward returning to meaningful activities that improve patient's QOL and are attainable with patient's lung disease       Dr. Emily Filbert is Medical Director for [place] and [name].

## 2016-03-08 ENCOUNTER — Other Ambulatory Visit: Payer: Self-pay | Admitting: Family Medicine

## 2016-03-08 DIAGNOSIS — F320 Major depressive disorder, single episode, mild: Secondary | ICD-10-CM

## 2016-03-10 ENCOUNTER — Encounter: Payer: 59 | Attending: Pulmonary Disease

## 2016-03-10 ENCOUNTER — Encounter: Payer: Self-pay | Admitting: *Deleted

## 2016-03-10 DIAGNOSIS — J8410 Pulmonary fibrosis, unspecified: Secondary | ICD-10-CM

## 2016-03-10 NOTE — Progress Notes (Signed)
Daily Session Note  Patient Details  Name: Erika Martinez MRN: [id number] Date of Birth: August 20, 1955 Referring Provider:   April Manson Pulmonary Rehab from 12/14/2015 in [facility] Cardiac and Pulmonary Rehab  Referring Provider  McQuaid      Encounter Date: 03/10/2016  Check In:     Session Check In - 03/10/16 1439      Check-In   Location [facility]-Cardiac & Pulmonary Rehab   Staff Present Carson Myrtle, BS, RRT, Respiratory Therapist;Kelly Amedeo Plenty, BS, ACSM CEP, Exercise Physiologist;Amanda Oletta Darter, BA, ACSM CEP, Exercise Physiologist   Supervising physician immediately available to respond to emergencies [name] immediately available ER MD   Physician(s) Jacqualine Code and Lord   Medication changes reported     No   Fall or balance concerns reported    No   Warm-up and Cool-down Performed as group-led instruction   Resistance Training Performed Yes   VAD Patient? No     Pain Assessment   Currently in Pain? No/denies         Goals Met:  Proper associated with RPD/PD & O2 Sat Independence with exercise equipment Exercise tolerated well Strength training completed today  Goals Unmet:  Not Applicable  Comments: Pt able to follow exercise prescription today without complaint.  Will continue to monitor for progression.    Dr. Emily Filbert is Medical Director for [place] and [name].

## 2016-03-10 NOTE — Progress Notes (Signed)
Pulmonary Individual Treatment Plan  Patient Details  Name: Erika Martinez MRN: [id number] Date of Birth: 1956/06/05 Referring Provider:   April Manson Pulmonary Rehab from 12/14/2015 in [facility] Cardiac and Pulmonary Rehab  Referring Provider  McQuaid      Initial Encounter Date:  Flowsheet Row Pulmonary Rehab from 12/14/2015 in [facility] Cardiac and Pulmonary Rehab  Date  12/14/15  Referring Provider  McQuaid      Visit Diagnosis: Pulmonary fibrosis ([place])  Patient's Home Medications on Admission:  Current Outpatient Prescriptions:  .  ADVAIR DISKUS 500-50 MCG/DOSE AEPB, INHALE 1 PUFF INTO THE LUNGS 2 (TWO) TIMES DAILY., Disp: 60 each, Rfl: 5 .  albuterol (PROVENTIL) (2.5 MG/3ML) 0.083% nebulizer solution, Take 3 mLs (2.5 mg total) by nebulization every 4 (four) hours as needed for wheezing or shortness of breath. DX Code: J84.10, Disp: 75 mL, Rfl: 12 .  ALPRAZolam (XANAX) 0.5 MG tablet, TAKE 1 TABLET BY MOUTH 3 TIMES A DAY AS NEEDED FOR ANXIETY, Disp: 30 tablet, Rfl: 2 .  azaTHIOprine (IMURAN) 50 MG tablet, Take 2 tablets (100 mg total) by mouth daily. Daily, Disp: 60 tablet, Rfl: 5 .  cholecalciferol (VITAMIN D) 1000 UNITS tablet, Take 2,000 Units by mouth daily., Disp: , Rfl:  .  diltiazem (CARDIZEM) 60 MG tablet, TAKE 1 TABLET (60 MG TOTAL) BY MOUTH DAILY., Disp: 30 tablet, Rfl: 2 .  fluconazole (DIFLUCAN) 150 MG tablet, TAKE 1 TABLET WEEKLY AS NEEDED FOR THRUSH, Disp: 4 tablet, Rfl: 2 .  fluticasone (FLONASE) 50 MCG/ACT nasal spray, Place 2 sprays into both nostrils daily., Disp: 16 g, Rfl: 6 .  loratadine (CLARITIN) 10 MG tablet, TAKE 1 TABLET (10 MG TOTAL) BY MOUTH 2 (TWO) TIMES DAILY., Disp: 60 tablet, Rfl: 3 .  nystatin (MYCOSTATIN) 100000 UNIT/ML suspension, TAKE 1 TABLESPOON BY MOUTH BEFORE MEALS AND AT BEDTIME (SWISH AND SPIT OUT), Disp: 473 mL, Rfl: 2 .  omeprazole (PRILOSEC) 40 MG capsule, TAKE 1 CAPSULE TWICE DAILY, Disp: 60 capsule, Rfl: 2 .  PEG 3350-KCl-NaBcb-NaCl-NaSulf  (PEG-3350/ELECTROLYTES) 236 g SOLR, Take by mouth., Disp: , Rfl:  .  predniSONE (DELTASONE) 10 MG tablet, Take 1 tablet (10 mg total) by mouth daily with breakfast., Disp: 30 tablet, Rfl: 2 .  sulfamethoxazole-trimethoprim (BACTRIM DS,SEPTRA DS) 800-160 MG tablet, TAKE 1 TABLET BY MOUTH 3 (THREE) TIMES A WEEK., Disp: , Rfl: 5 .  VENTOLIN HFA 108 (90 Base) MCG/ACT inhaler, INHALE 2 PUFFS INTO THE LUNGS EVERY 4 (FOUR) HOURS AS NEEDED FOR WHEEZING OR SHORTNESS OF BREATH., Disp: 18 g, Rfl: 5 .  vitamin B-12 (CYANOCOBALAMIN) 250 MCG tablet, Place 250 mcg under the tongue every other day. , Disp: , Rfl:   Past Medical History: Past Medical History:  Diagnosis Date  . Abnormal hemoglobin (HCC)   . Allergy   . Anxiety   . Asthma   . Controlled insomnia   . Erythrocytosis   . GERD (gastroesophageal reflux disease)   . Hep C w/o coma, chronic ([place])   . Hypertension   . IBS (irritable bowel syndrome)   . Lumbago   . Mitral valve prolapse   . Oxygen dependent   . Plantar wart   . Pulmonary fibrosis ([place])   . Scleroderma ([place])    Dr. Pennie Banter  . Seasonal allergies   . Spondylolisthesis   . Symptomatic menopausal or female climacteric states     Tobacco Use: History  Smoking Status  . Never Smoker  Smokeless Tobacco  . Never Used    Labs: Recent  Review Flowsheet Data    Labs for ITP Cardiac and Pulmonary Rehab Latest Ref Rng & Units 07/14/2013 07/05/2015 08/02/2015   Cholestrol 100 - 199 mg/dL 169 - 196   LDLCALC 0 - 99 mg/dL 95 - 112(H)   HDL >39 mg/dL 58 - 68   Trlycerides 0 - 149 mg/dL 78 - 78   Hemoglobin A1c 4.8 - 5.6 % - 6.3(H) -       ADL UCSD:     Pulmonary Assessment Scores    Row Name 12/14/15 1350         ADL UCSD   ADL Phase Entry     SOB Score total 43     Rest 0     Walk 2     Stairs 4     Bath 1     Dress 1     Shop 2        Pulmonary Function Assessment:   Exercise Target Goals:    Exercise Program Goal: Individual exercise prescription set  with THRR, safety & activity barriers. Participant demonstrates ability to understand and report RPE using BORG scale, to self-measure pulse accurately, and to acknowledge the importance of the exercise prescription.  Exercise Prescription Goal: Starting with aerobic activity 30 plus minutes a day, 3 days per week for initial exercise prescription. Provide home exercise prescription and guidelines that participant acknowledges understanding prior to discharge.  Activity Barriers & Risk Stratification:     Activity Barriers & Cardiac Risk Stratification - 12/14/15 1204      Activity Barriers & Cardiac Risk Stratification   Activity Barriers Deconditioning;Joint Problems      6 Minute Walk:     6 Minute Walk    Row Name 12/14/15 1204 12/14/15 1216 12/14/15 1301     6 Minute Walk   Phase Initial  -  -   Distance 810 feet  -  -   Walk Time 5.81 minutes  -  -   # of Rest Breaks 1  -  -   RPE 15  -  -   Perceived Dyspnea  3  "tired " on 8 liters oxgyen for 6 minute walk -   Pulse ox down to 82% at end of 6 minute walk even on 8 liters of supplemental oxygen. Pulse ox came up to 92% with rest.  3   Resting HR 80 bpm  -  -   Resting BP 122/70  -  -   Max Ex. HR 118 bpm  -  -   Max Ex. BP 138/70  -  -   Row Name 12/14/15 1302 12/14/15 1312       6 Minute Walk   Phase Initial Initial       Initial Exercise Prescription:     Initial Exercise Prescription - 12/14/15 1400      Date of Initial Exercise RX and Referring Provider   Date 12/14/15   Referring Provider McQuaid     Oxygen   Oxygen Continuous   Liters 4     Treadmill   MPH 1.4   Grade 1   Minutes 15   METs 2.26     NuStep   Level 2   Minutes 15   METs 2.3     REL-XR   Level 1   Minutes 15   METs 2.3     Prescription Details   Frequency (times per week) 3   Duration Progress to 30 minutes of continuous aerobic without signs/symptoms  of physical distress     Intensity   THRR 40-80% of Max  Heartrate 111-142   Ratings of Perceived Exertion 11-13   Perceived Dyspnea 0-4     Progression   Progression Continue to progress workloads to maintain intensity without signs/symptoms of physical distress.     Resistance Training   Training Prescription Yes   Weight 2   Reps 10-15      Perform Capillary Blood Glucose checks as needed.  Exercise Prescription Changes:     Exercise Prescription Changes    Row Name 12/26/15 1400 01/09/16 1300 01/23/16 1400 02/06/16 1300 02/21/16 1100     Exercise Review   Progression Yes Yes Yes Yes Yes     Response to Exercise   Blood Pressure (Admit) 110/60 118/64 112/66 120/66 148/78   Blood Pressure (Exercise) 126/64 138/64 150/84 124/74 112/66   Blood Pressure (Exit) 108/50 140/70 124/62 104/62 106/74   Heart Rate (Admit) 106 bpm 114 bpm 110 bpm 94 bpm 100 bpm   Heart Rate (Exercise) 115 bpm 120 bpm 118 bpm 112 bpm 106 bpm   Heart Rate (Exit) 104 bpm 100 bpm 108 bpm 100 bpm 95 bpm   Oxygen Saturation (Admit) 96 % 89 % 97 % 96 % 100 %   Oxygen Saturation (Exercise) 92 % 90 % 93 % 92 % 89 %   Oxygen Saturation (Exit) 94 % 96 % 100 % 95 % 100 %   Rating of Perceived Exertion (Exercise) '13 13 13 13 13   ' Perceived Dyspnea (Exercise) '3 3 3 3 3   ' Comments  - Home Exercise Guidelines given 01/09/16  -  -  -     Progression   Progression Continue to progress workloads to maintain intensity without signs/symptoms of physical distress. Continue to progress workloads to maintain intensity without signs/symptoms of physical distress. Continue to progress workloads to maintain intensity without signs/symptoms of physical distress. Continue to progress workloads to maintain intensity without signs/symptoms of physical distress. Continue to progress workloads to maintain intensity without signs/symptoms of physical distress.     Resistance Training   Training Prescription Yes Yes Yes Yes Yes   Weight 2 6  exercise O2 6-8L '3 3 3   ' Reps 10-15 10-15 10-15  10-15 10-12     Interval Training   Interval Training  -  - No  - No     Oxygen   Oxygen Continuous  - Continuous  - Continuous   Liters '4 2 8 8  15 ' L/min on TM 8     Treadmill   MPH 1.2 1.2 1.2 1.2 1.3   Grade 0 0.5 0 0 0   Minutes '10 10 12 25 20   ' METs  - 2 1.92 1.92 2     NuStep   Level  - 2  -  -  -   Minutes  - 15  -  -  -   METs  - 1.5  -  -  -     REL-XR   Level '3 3 3 4 4   ' Minutes '15 15 15 15 15   ' METs 1.9 2.1  - 3 2.6     Biostep-RELP   Level  - 2  -  -  -   Minutes  - 15  -  -  -   METs  - 2  -  -  -     Home Exercise Plan   Plans to continue exercise at  -  Home  Stationary bike  -  -  -   Frequency  - Add 4 additional days to program exercise sessions.  -  -  -   Row Name 03/06/16 1100             Exercise Review   Progression Yes         Response to Exercise   Blood Pressure (Exercise) 160/80       Blood Pressure (Exit) 130/80       Heart Rate (Admit) 105 bpm       Heart Rate (Exercise) 106 bpm       Heart Rate (Exit) 98 bpm       Oxygen Saturation (Admit) 100 %       Oxygen Saturation (Exercise) 97 %       Oxygen Saturation (Exit) 99 %       Rating of Perceived Exertion (Exercise) 13       Perceived Dyspnea (Exercise) 3         Progression   Progression Continue to progress workloads to maintain intensity without signs/symptoms of physical distress.         Resistance Training   Training Prescription Yes       Weight 3       Reps 10-12         Interval Training   Interval Training No         Oxygen   Oxygen Continuous       Liters 8         Treadmill   MPH 1.2       Grade 0       Minutes 20       METs 1.92         REL-XR   Level 5       Minutes 15          Exercise Comments:     Exercise Comments    Row Name 12/14/15 1217 12/19/15 1337 01/09/16 1312 01/09/16 1356 01/23/16 1423   Exercise Comments  Pulse ox down to 82% at end of 6 minute walk even on 8 liters of supplemental oxygen. Pulse ox came up to 92% with rest.   First full day of exercise!  Patient was oriented to gym and equipment including functions, settings, policies, and procedures.  Patient's individual exercise prescription and treatment plan were reviewed.  All starting workloads were established based on the results of the 6 minute walk test done at initial orientation visit.  The plan for exercise progression was also introduced and progression will be customized based on patient's performance and goals. Reviewed home exercise with pt today.  Pt plans to continue riding stationary bike at home  for exercise.  We discussed her progression with exercise at home to increase her current time. Reviewed THR, pulse, RPE, sign and symptoms, and when to call 911 or MD.  Also discussed weather considerations and indoor options.  Pt voiced understanding. Ravleen is continuing to improve her intensity and duration of exercise. Nastashia continues to progress well with exercise.   [place] Name 02/06/16 1329 02/06/16 1605 02/21/16 1152 03/06/16 1119     Exercise Comments Amaree is progressing well with exercise.  She has added resistnace on the XR and more time to TM. Ms Prestridge had an oxygen titration [facility] at [facility] for her Lung Transplant surgery, so we will not perform a Mid65md DLovellehas progressed well with exercise. DMeghahas not been able to attend  regularly as she is back at work.       Discharge Exercise Prescription (Final Exercise Prescription Changes):     Exercise Prescription Changes - 03/06/16 1100      Exercise Review   Progression Yes     Response to Exercise   Blood Pressure (Exercise) 160/80   Blood Pressure (Exit) 130/80   Heart Rate (Admit) 105 bpm   Heart Rate (Exercise) 106 bpm   Heart Rate (Exit) 98 bpm   Oxygen Saturation (Admit) 100 %   Oxygen Saturation (Exercise) 97 %   Oxygen Saturation (Exit) 99 %   Rating of Perceived Exertion (Exercise) 13   Perceived Dyspnea (Exercise) 3     Progression   Progression Continue to progress workloads  to maintain intensity without signs/symptoms of physical distress.     Resistance Training   Training Prescription Yes   Weight 3   Reps 10-12     Interval Training   Interval Training No     Oxygen   Oxygen Continuous   Liters 8     Treadmill   MPH 1.2   Grade 0   Minutes 20   METs 1.92     REL-XR   Level 5   Minutes 15       Nutrition:  Target Goals: Understanding of nutrition guidelines, daily intake of sodium <1519m, cholesterol <2055m calories 30% from fat and 7% or less from saturated fats, daily to have 5 or more servings of fruits and vegetables.  Biometrics:     Pre Biometrics - 12/14/15 1321      Pre Biometrics   Height 5' 1.2" (1.554 m)   Weight 151 lb 11.2 oz (68.8 kg)   Waist Circumference 42 inches   Hip Circumference 39.8 inches   Waist to Hip Ratio 1.06 %   BMI (Calculated) 28.5   Single Leg Stand 30 seconds       Nutrition Therapy Plan and Nutrition Goals:     Nutrition Therapy & Goals - 12/14/15 1226      Nutrition Therapy   Diet --  Deb wants to meet with the Registered Dietician to help her lose weight. Deb has cut out bread and sodas.       Nutrition Discharge: Rate Your Plate Scores:   Psychosocial: Target Goals: Acknowledge presence or absence of depression, maximize coping skills, provide positive support system. Participant is able to verbalize types and ability to use techniques and skills needed for reducing stress and depression.  Initial Review & Psychosocial Screening:     Initial Psych Review & Screening - 12/14/15 13[place]      Quality of Life Scores:     Quality of Life - 12/14/15 1323      Quality of Life Scores   Health/Function Pre 20.69 %   Socioeconomic Pre 20.38 %   Psych/Spiritual Pre 21 %   Family Pre 20 %   GLOBAL Pre 20.58 %      PHQ-9: Recent Review Flowsheet Data    [facility] 12/14/2015 10/03/2015 07/03/2015 04/06/2015  03/14/2015   Decreased Interest 1 0 0 0 0   Down, Depressed, Hopeless 1 0 0 0 0   PHQ - 2 Score 2 0 0 0 0   Altered sleeping 1 - - - -   Tired, decreased energy 2 - - - -   Change in appetite 2 - - - -  Feeling bad or failure about yourself  1 - - - -   Trouble concentrating 0 - - - -   Moving slowly or fidgety/restless 0 - - - -   Suicidal thoughts 0 - - - -   PHQ-9 Score 8 - - - -   Difficult doing work/chores Somewhat difficult - - - -      Psychosocial Evaluation and Intervention:     Psychosocial Evaluation - 01/07/16 1231      Psychosocial Evaluation & Interventions   Comments Counselor met with Ms. Mandeville today as she has reutrned to this program.   She is 60 years old and reports she is in the lung transplant process to see if she will be chosen.  Ms. Schwieger has Pulmonary Fibrosis.  She has a strong support system with her sister and a daughter who live close by.  She is actively involved in her local church community.  Ms. Montanye states she sleeps "okay" with some nights needing to take a prescribed medication to help with her anxiety.  She states her appetite is good most of the time and she denies a history of depression, but some anxiety which is being treated.  Ms. 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  Ms. Fuhriman goals for this program are to increase her stamina and strength.  Counselor will continue to follow with her during the course of this program.         Psychosocial Re-Evaluation:     Psychosocial Re-Evaluation    [place] Name 01/21/16 1655 03/05/16 1309           Psychosocial Re-Evaluation   Comments Counselor follow up with Ms. Bobbye Charleston today.  She reports doing well and enjoying this program; including experiencing benefits of increased stamina.  She reports being approved for the lung transplant list and has had to go through  multiple tests and meet extensive criteria for this to occur.  Ms. Grumbine reports this is stressful for her currently as well as school starting back in a few weeks.  Ms. Tamez reports sleeping "okay" currently and hopes that will continue, although she has to wean off her medications that have helped her sleep PRN.  Counselor will continue to follow with Ms. Dedeaux throughout the course of this program.  Patient psychosocial assessment reveals no barriers to participation in Pulmonary Rehab. Psychosocial areas that are currently affecting patient's rehab experience include concerns about , financial resources,   And Work. Patient does continue to exhibit positive  coping skills to deal with her psychosocial concerns. Offered emotional support and reassurance. Patient does feel she is making progress toward Pulmonary Rehab goals. Patient reports her health and activity level has improved in the past 30 days as evidenced by patient's report of increased ability to manage her daily activities and return to work for an eight hour day..  Patient reports  feeling positive about current and projected progression in Pulmonary Rehab. After reviewing the patient's treatment plan, the patient is making progress toward Pulmonary Rehab goals. Patient's rate of progress toward rehab goals is good. Plan of action to help patient continue to work towards rehab goals include expecting her to be able to attend program 3 days a week even though she has returned to work. Watching progress towards her ability to be placed on the transplant list. [place] has raised almost 9 thousand  dollars, has stopped taking the Xanax and is close to her weight loss goal required to be placed on the list.  Will continue to monitor and evaluate progress toward psychosocial goal(s).        Education: Education Goals: Education classes will be provided on a weekly basis, covering required topics. Participant will state understanding/return  demonstration of topics presented.  Learning Barriers/Preferences:     Learning Barriers/Preferences - 12/14/15 1204      Learning Barriers/Preferences   Learning Barriers Sight  wears reading glasses      Education Topics: Initial Evaluation Education: - Verbal, written and demonstration of respiratory meds, RPE/PD scales, oximetry and breathing techniques. Instruction on use of nebulizers and MDIs: cleaning and proper use, rinsing mouth with steroid doses and importance of monitoring MDI activations. Flowsheet Row Documentation from 03/14/2015 in [place]  Date  12/05/14  Educator  LB  Instruction Review Code  2- meets goals/outcomes      General Nutrition Guidelines/Fats and Fiber: -Group instruction provided by verbal, written material, models and posters to present the general guidelines for heart healthy nutrition. Gives an explanation and review of dietary fats and fiber. Flowsheet Row Pulmonary Rehab from 02/01/2016 in [facility] Cardiac and Pulmonary Rehab  Date  12/31/15  Educator  CR  Instruction Review Code  2- meets goals/outcomes      Controlling Sodium/Reading Food Labels: -Group verbal and written material supporting the discussion of sodium use in heart healthy nutrition. Review and explanation with models, verbal and written materials for utilization of the food label. Flowsheet Row Pulmonary Rehab from 02/01/2016 in [facility] Cardiac and Pulmonary Rehab  Date  01/07/16  Educator  CR  Instruction Review Code  2- meets goals/outcomes      Exercise Physiology & Risk Factors: - Group verbal and written instruction with models to review the exercise physiology of the cardiovascular system and associated critical values. Details cardiovascular disease risk factors and the goals associated with each risk factor. Flowsheet Row Pulmonary Rehab from 02/01/2016 in [facility] Cardiac and Pulmonary Rehab  Date  12/26/15  Educator  Alberteen Sam  and Nada Maclachlan   Instruction Review Code  2- meets goals/outcomes      Aerobic Exercise & Resistance Training: - Gives group verbal and written discussion on the health impact of inactivity. On the components of aerobic and resistive training programs and the benefits of this training and how to safely progress through these programs.   Flexibility, Balance, General Exercise Guidelines: - Provides group verbal and written instruction on the benefits of flexibility and balance training programs. Provides general exercise guidelines with specific guidelines to those with heart or lung disease. Demonstration and skill practice provided.   Stress Management: - Provides group verbal and written instruction about the health risks of elevated stress, cause of high stress, and healthy ways to reduce stress. Flowsheet Row Pulmonary Rehab from 02/01/2016 in [facility] Cardiac and Pulmonary Rehab  Date  12/19/15  Educator  Kathreen Cornfield  Instruction Review Code  2- meets goals/outcomes      Depression: - Provides group verbal and written instruction on the correlation between heart/lung disease and depressed mood, treatment options, and the stigmas associated with seeking treatment.   Exercise & Equipment Safety: - Individual verbal instruction and demonstration of equipment use and safety with use of the equipment. Flowsheet Row Pulmonary Rehab from 02/01/2016 in [facility] Cardiac and Pulmonary Rehab  Date  12/14/15  Educator  C. [place]  Instruction Review Code  1- partially meets, needs review/practice      Infection Prevention: - Provides verbal and written material to individual with discussion of infection control including proper hand washing and proper equipment cleaning during exercise session. Flowsheet Row Pulmonary Rehab from 02/01/2016 in [facility] Cardiac and Pulmonary Rehab  Date  12/14/15  Educator  C. EnterkinRN  Instruction Review Code  2- meets goals/outcomes      Falls  Prevention: - Provides verbal and written material to individual with discussion of falls prevention and safety. Flowsheet Row Pulmonary Rehab from 02/01/2016 in [facility] Cardiac and Pulmonary Rehab  Date  12/14/15  Educator  C. Alvin  Instruction Review Code  2- meets goals/outcomes      Diabetes: - Individual verbal and written instruction to review signs/symptoms of diabetes, desired ranges of glucose level fasting, after meals and with exercise. Advice that pre and post exercise glucose checks will be done for 3 sessions at entry of program.   Chronic Lung Diseases: - Group verbal and written instruction to review new updates, new respiratory medications, new advancements in procedures and treatments. Provide informative websites and "800" numbers of self-education. Flowsheet Row Pulmonary Rehab from 02/01/2016 in [facility] Cardiac and Pulmonary Rehab  Date  12/24/15  Educator  LB  Instruction Review Code  2- meets goals/outcomes      Lung Procedures: - Group verbal and written instruction to describe testing methods done to diagnose lung disease. Review the outcome of test results. Describe the treatment choices: Pulmonary Function Tests, ABGs and oximetry. Flowsheet Row Documentation from 03/14/2015 in [place]  Date  01/12/15  Educator  sj  Instruction Review Code  2- meets goals/outcomes      Energy Conservation: - Provide group verbal and written instruction for methods to conserve energy, plan and organize activities. Instruct on pacing techniques, use of adaptive equipment and posture/positioning to relieve shortness of breath. Flowsheet Row Documentation from 03/14/2015 in [place]  Date  01/24/15  Educator  SW  Instruction Review Code  2- meets goals/outcomes      Triggers: - Group verbal and written instruction to review types of environmental controls: home humidity, furnaces, filters, dust  mite/pet prevention, HEPA vacuums. To discuss weather changes, air quality and the benefits of nasal washing. Flowsheet Row Documentation from 03/14/2015 in [place]  Date  12/18/14  Educator  LB  Instruction Review Code  2- meets goals/outcomes      Exacerbations: - Group verbal and written instruction to provide: warning signs, infection symptoms, calling MD promptly, preventive modes, and value of vaccinations. Review: effective airway clearance, coughing and/or vibration techniques. Create an Sports administrator. Flowsheet Row Pulmonary Rehab from 02/01/2016 in [facility] Cardiac and Pulmonary Rehab  Date  01/21/16  Educator  LB  Instruction Review Code  2- meets goals/outcomes      Oxygen: - Individual and group verbal and written instruction on oxygen therapy. Includes supplement oxygen, available portable oxygen systems, continuous and intermittent flow rates, oxygen safety, concentrators, and Medicare reimbursement for oxygen. Flowsheet Row Documentation from 03/14/2015 in [place]  Date  12/05/14  Educator  LB  Instruction Review Code  2- meets goals/outcomes      Respiratory Medications: - Group verbal and written instruction to review medications for lung disease. Drug class, frequency, complications, importance of spacers, rinsing mouth after steroid MDI's, and proper cleaning methods for nebulizers. Flowsheet Row Documentation from 03/14/2015 in [place]  [facility]  Date  12/22/14  Educator  [facility]  Instruction Review Code  2- meets goals/outcomes [Spacer and instructions given to pt. ]      AED/CPR: - Group verbal and written instruction with the use of models to demonstrate the basic use of the AED with the basic ABC's of resuscitation. Flowsheet Row Pulmonary Rehab from 02/01/2016 in [facility] Cardiac and Pulmonary Rehab  Date  01/11/16  Educator  CE  Instruction Review Code  2-  meets goals/outcomes      Breathing Retraining: - Provides individuals verbal and written instruction on purpose, frequency, and proper technique of diaphragmatic breathing and pursed-lipped breathing. Applies individual practice skills. Flowsheet Row Pulmonary Rehab from 02/01/2016 in [facility] Cardiac and Pulmonary Rehab  Date  12/19/15  Educator  [facility]  Instruction Review Code  2- meets goals/outcomes      Anatomy and Physiology of the Lungs: - Group verbal and written instruction with the use of models to provide basic lung anatomy and physiology related to function, structure and complications of lung disease. Flowsheet Row Pulmonary Rehab from 02/01/2016 in [facility] Cardiac and Pulmonary Rehab  Date  02/01/16  Educator  [place]  Instruction Review Code  2- meets goals/outcomes      Heart Failure: - Group verbal and written instruction on the basics of heart failure: signs/symptoms, treatments, explanation of ejection fraction, enlarged heart and cardiomyopathy. Flowsheet Row Pulmonary Rehab from 02/01/2016 in [facility] Cardiac and Pulmonary Rehab  Date  01/25/16  Educator  CE  Instruction Review Code  2- meets goals/outcomes      Sleep Apnea: - Individual verbal and written instruction to review Obstructive Sleep Apnea. Review of risk factors, methods for diagnosing and types of masks and machines for OSA.   Anxiety: - Provides group, verbal and written instruction on the correlation between heart/lung disease and anxiety, treatment options, and management of anxiety. Flowsheet Row Documentation from 03/14/2015 in [place]  Date  01/31/15  Educator  [facility]  Instruction Review Code  2- Meets goals/outcomes      Relaxation: - Provides group, verbal and written instruction about the benefits of relaxation for patients with heart/lung disease. Also provides patients with examples of relaxation techniques. Flowsheet Row Pulmonary Rehab from 02/01/2016 in [facility]  Cardiac and Pulmonary Rehab  Date  01/16/16  Educator  Kathreen Cornfield, [facility]  Instruction Review Code  2- Meets goals/outcomes      Knowledge Questionnaire Score:     Knowledge Questionnaire Score - 12/14/15 1324      Knowledge Questionnaire Score   Pre Score 9       Core Components/Risk Factors/Patient Goals at Admission:     Personal Goals and Risk Factors at Admission - 12/14/15 1209      Core Components/Risk Factors/Patient Goals on Admission    Weight Management Yes   Intervention Weight Management: Develop a combined nutrition and exercise program designed to reach desired caloric intake, while maintaining appropriate intake of nutrient and fiber, sodium and fats, and appropriate energy expenditure required for the weight goal.;Weight Management: Provide education and appropriate resources to help participant work on and attain dietary goals.;Weight Management/Obesity: Establish reasonable short term and long term weight goals.;Obesity: Provide education and appropriate resources to help participant work on and attain dietary goals.   Admit Weight --  On prednisone currently. Janay reports she needs to lose 10 lbs for her potential [facility]   Goal Weight: Short Term 140 lb (63.5 kg)  Goal Weight: Long Term 125 lb (56.7 kg)   Expected Outcomes Short Term: Continue to assess and modify interventions until short term weight is achieved;Weight Maintenance: Understanding of the daily nutrition guidelines, which includes 25-35% calories from fat, 7% or less cal from saturated fats, less than 291m cholesterol, less than 1.5gm of sodium, & 5 or more servings of fruits and vegetables daily;Weight Loss: Understanding of general recommendations for a balanced deficit meal plan, which promotes 1-2 lb weight loss per week and includes a negative energy balance of [phone] kcal/d;Understanding recommendations for meals to include 15-35% energy as protein, 25-35% energy from fat,  35-60% energy from carbohydrates, less than 20109mof dietary cholesterol, 20-35 gm of total fiber daily;Weight Gain: Understanding of general recommendations for a high calorie, high protein meal plan that promotes weight gain by distributing calorie intake throughout the day with the consumption for 4-5 meals, snacks, and/or supplements   Sedentary Yes   Intervention Provide advice, education, support and counseling about physical activity/exercise needs.;Develop an individualized exercise prescription for aerobic and resistive training based on initial evaluation findings, risk stratification, comorbidities and participant's personal goals.   Expected Outcomes Achievement of increased cardiorespiratory fitness and enhanced flexibility, muscular endurance and strength shown through measurements of functional capacity and personal statement of participant.   Intervention Provide advice, education, support and counseling about physical activity/exercise needs.;Develop an individualized exercise prescription for aerobic and resistive training based on initial evaluation findings, risk stratification, comorbidities and participant's personal goals.   Expected Outcomes Achievement of increased cardiorespiratory fitness and enhanced flexibility, muscular endurance and strength shown through measurements of functional capacity and personal statement of participant.   Stress Yes   Intervention Offer individual and/or small group education and counseling on adjustment to heart disease, stress management and health-related lifestyle change. Teach and support self-help strategies.;Refer participants experiencing significant psychosocial distress to appropriate mental health specialists for further evaluation and treatment. When possible, include family members and significant others in education/counseling sessions.   Expected Outcomes Short Term: Participant demonstrates changes in health-related behavior, relaxation  and other stress management skills, ability to obtain effective social support, and compliance with psychotropic medications if prescribed.;Long Term: Emotional wellbeing is indicated by absence of clinically significant psychosocial distress or social isolation.      Core Components/Risk Factors/Patient Goals Review:      Goals and Risk Factor Review    Row Name 12/19/15 1335 12/28/15 1330 01/07/16 1504 01/22/16 0735 01/29/16 0751     Core Components/Risk Factors/Patient Goals Review   Personal Goals Review Improve shortness of breath with ADL's;Develop more efficient breathing techniques such as purse lipped breathing and diaphragmatic breathing and practicing self-pacing with activity. Sedentary;Increase Strength and Stamina Increase knowledge of respiratory medications and ability to use respiratory devices properly.;Improve shortness of breath with ADL's;Develop more efficient breathing techniques such as purse lipped breathing and diaphragmatic breathing and practicing self-pacing with activity. Sedentary;Increase Strength and Stamina;Weight Management/Obesity Develop more efficient breathing techniques such as purse lipped breathing and diaphragmatic breathing and practicing self-pacing with activity.;Improve shortness of breath with ADL's;Increase knowledge of respiratory medications and ability to use respiratory devices properly.;Increase Strength and Stamina;Sedentary   Review Reviewed pursed lip breathing technique with pt today to use during exercise and at home. DeLorias riding her bike at home on off days from rehab.  She starting to notice an increase in her strength and stamina. Ms BaNadelas a good understanding of her MDI's. She does have problems with thrush and her Advair. Educated Ms  Bobbye Charleston on her MDI's and SVN being equally important, and if good technique with a spacer is performed, an MDI's can be the same amount of medicine as a SVN. We discussed increasing her oxygen on the  treadmill, so she can get her full 10 minutes accomplished with out low O2Sat's and increased shortness of breath. Even with PLB, the treadmill is a challenge. Ms Klausner received a letter from the transplant team. She is required to exercise 3 days/week in [facility] and also loss 7-10lbs. She plans to meet with the dietitian one on one for help with this weight loss. Ms Walthers meets with the transplant team on 02/04/16 and 02/05/16. She has worked her exercise goal on the TM to 21mns. and 1.2748m. [facility] list rquirement for transplant lists 1.5-2.0 on the TM, so that will be her next goal. She has a good understaning of her MDI's and her shortness of breath - uses pacing , PLB, and adjusts her oxygen indepently for increased exercise.   Expected Outcomes Pt will become independent in using pursed lip breathing to improve overall shortness of breath. DeShamonicaill continue to attend exercise and educaiton classes to boost her functional capacity.  -  - Work towards increasing her spend on the TM to 1.48m248m   RowCorydonme 03/05/16 1309             Core Components/Risk Factors/Patient Goals Review   Personal Goals Review Increase knowledge of respiratory medications and ability to use respiratory devices properly.;Improve shortness of breath with ADL's;Develop more efficient breathing techniques such as purse lipped breathing and diaphragmatic breathing and practicing self-pacing with activity.;Increase Strength and Stamina;Weight Management/Obesity       Review DebThursas been out for a few weeks, since she returned to work. She has been able to clear her schedule to attend Pulmonary Rehab 3 days a week as well as maintain her work. She continues to state understanding of properuse of her MDI.s  DebTikiantinues to work toward required weight loss and is down 7 - 8 pounds. She continues to manage her medications and uses her purse lipped breathing technique as needed.        Expected Outcomes Continue compliance  with medications and use of MDI's. Continue to work toard reaching the weight loss, fund raising and exercise goals required to be placed on the transplant list to see her name on the list and ready to start "[employer]         Core Components/Risk Factors/Patient Goals at Discharge (Final Review):      Goals and Risk Factor Review - 03/05/16 1309      Core Components/Risk Factors/Patient Goals Review   Personal Goals Review Increase knowledge of respiratory medications and ability to use respiratory devices properly.;Improve shortness of breath with ADL's;Develop more efficient breathing techniques such as purse lipped breathing and diaphragmatic breathing and practicing self-pacing with activity.;Increase Strength and Stamina;Weight Management/Obesity   Review DebDeshas been out for a few weeks, since she returned to work. She has been able to clear her schedule to attend Pulmonary Rehab 3 days a week as well as maintain her work. She continues to state understanding of properuse of her MDI.s  DebJayelntinues to work toward required weight loss and is down 7 - 8 pounds. She continues to manage her medications and uses her purse lipped breathing technique as needed.    Expected Outcomes Continue compliance with medications and use of MDI's. Continue to work toard reaching the [employer]  loss, fund raising and exercise goals required to be placed on the transplant list to see her name on the list and ready to start "[employer]".      ITP Comments:     ITP Comments    Row Name 12/14/15 1215 12/14/15 1216 12/14/15 1221 12/14/15 1226 01/09/16 1321   ITP Comments taking xanax when she needs it. Marthella reports it is stressful about the possibility of a lung transplant and all the workup.   Pulse ox down to 82% at end of 6 minute walk even on 8 liters of supplemental oxygen. Pulse ox came up to 92% with rest.  Right femerol area is sore Deb reports from her heart cath last week and her right groin area  and leg are bruised. Deb reports her right shoulder hurts from the position she had to be for her ultrasound of her liver at [facility] (being evaluated for a lung transplant).  Deb wants to meet with the Registered Dietician to help her lose weight. Deb has cut out bread and sodas. Increased Cherrise from 6 to 8 liters of oxygen while on XR.  Increased Yunuen to 15 liters oxygen using her own nasal cannula with a reservoir while Chanele was on the treadmill.  Tanay was able to walk at a speed of 1.2 mph with 0.5% incline on the treadmill for 10 minutes without stopping.  This is the first time Mikalah has been able to do this.     Francisco Name 02/06/16 1606 03/06/16 1121         ITP Comments Ms Leedy had a PFT at [facility] with a DLCO of 17%. She also had an ABG with PO2 of 42. 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 Akasha has not been able to attend regularly as she is back at work.         Comments:30 Day Review

## 2016-03-10 NOTE — Telephone Encounter (Signed)
Patient requesting refill of Alprazolam to [facility].  

## 2016-03-17 DIAGNOSIS — J8410 Pulmonary fibrosis, unspecified: Secondary | ICD-10-CM | POA: Diagnosis not present

## 2016-03-17 NOTE — Progress Notes (Signed)
Daily Session Note  Patient Details  Name: Erika Martinez MRN: [id number] Date of Birth: July 19, 1955 Referring Provider:   April Manson Pulmonary Rehab from 12/14/2015 in [facility] Cardiac and Pulmonary Rehab  Referring Provider  Mc[place]      Encounter Date: 03/17/2016  Check In:     Session Check In - 03/17/16 1503      Check-In   Location [facility]-Cardiac & Pulmonary Rehab   Staff Present Carson Myrtle, BS, RRT, Respiratory Therapist;Kelly Amedeo Plenty, BS, ACSM CEP, Exercise Physiologist;Kasiah Manka Oletta Darter, BA, ACSM CEP, Exercise Physiologist   Supervising physician immediately available to respond to emergencies [name] immediately available ER MD   Physician(s) Burlene Arnt and Marcelene Butte   Medication changes reported     No   Fall or balance concerns reported    No   Warm-up and Cool-down Performed as group-led instruction   Resistance Training Performed Yes   VAD Patient? No     Pain Assessment   Currently in Pain? No/denies         Goals Met:  Proper associated with RPD/PD & O2 Sat Independence with exercise equipment Exercise tolerated well Strength training completed today  Goals Unmet:  Not Applicable  Comments: Pt able to follow exercise prescription today without complaint.  Will continue to monitor for progression.    Dr. Emily Filbert is Medical Director for [facility] and [name].

## 2016-03-25 ENCOUNTER — Other Ambulatory Visit: Payer: Self-pay | Admitting: Pulmonary Disease

## 2016-03-26 ENCOUNTER — Ambulatory Visit (INDEPENDENT_AMBULATORY_CARE_PROVIDER_SITE_OTHER): Payer: 59 | Admitting: Family Medicine

## 2016-03-26 ENCOUNTER — Encounter: Payer: Self-pay | Admitting: Family Medicine

## 2016-03-26 VITALS — BP 122/64 | HR 101 | Temp 98.5°F | Resp 16 | Ht 61.0 in | Wt 145.0 lb

## 2016-03-26 DIAGNOSIS — J9611 Chronic respiratory failure with hypoxia: Secondary | ICD-10-CM

## 2016-03-26 DIAGNOSIS — J3089 Other allergic rhinitis: Secondary | ICD-10-CM | POA: Diagnosis not present

## 2016-03-26 DIAGNOSIS — J302 Other seasonal allergic rhinitis: Secondary | ICD-10-CM | POA: Diagnosis not present

## 2016-03-26 DIAGNOSIS — D849 Immunodeficiency, unspecified: Secondary | ICD-10-CM

## 2016-03-26 DIAGNOSIS — D899 Disorder involving the immune mechanism, unspecified: Secondary | ICD-10-CM

## 2016-03-26 DIAGNOSIS — L853 Xerosis cutis: Secondary | ICD-10-CM

## 2016-03-26 DIAGNOSIS — R05 Cough: Secondary | ICD-10-CM | POA: Diagnosis not present

## 2016-03-26 DIAGNOSIS — Z9889 Other specified postprocedural states: Secondary | ICD-10-CM

## 2016-03-26 DIAGNOSIS — F419 Anxiety disorder, unspecified: Secondary | ICD-10-CM | POA: Diagnosis not present

## 2016-03-26 DIAGNOSIS — R059 Cough, unspecified: Secondary | ICD-10-CM

## 2016-03-26 DIAGNOSIS — Z23 Encounter for immunization: Secondary | ICD-10-CM

## 2016-03-26 DIAGNOSIS — F320 Major depressive disorder, single episode, mild: Secondary | ICD-10-CM

## 2016-03-26 DIAGNOSIS — J8410 Pulmonary fibrosis, unspecified: Secondary | ICD-10-CM | POA: Diagnosis not present

## 2016-03-26 DIAGNOSIS — Z7682 Awaiting organ transplant status: Secondary | ICD-10-CM | POA: Diagnosis not present

## 2016-03-26 MED ORDER — TRIAMCINOLONE ACETONIDE 0.1 % EX CREA: 1.0000 "application " | TOPICAL_CREAM | Freq: Two times a day (BID) | CUTANEOUS | 0 refills | Status: DC

## 2016-03-26 MED ORDER — AZITHROMYCIN 250 MG PO TABS: ORAL_TABLET | ORAL | 0 refills | Status: DC

## 2016-03-26 MED ORDER — PREDNISONE 10 MG PO TABS: 5.0000 mg | ORAL_TABLET | Freq: Every day | ORAL | 0 refills | Status: DC

## 2016-03-26 NOTE — Patient Instructions (Signed)
Discussed [employer] - Ap for your phone to help with mindfulness.   Insomnia Insomnia is a sleep disorder that makes it difficult to fall asleep or to stay asleep. Insomnia can cause tiredness (fatigue), low energy, difficulty concentrating, mood swings, and poor performance at work or school.  There are three different ways to classify insomnia:  Difficulty falling asleep.  Difficulty staying asleep.  Waking up too early in the morning. Any type of insomnia can be long-term (chronic) or short-term (acute). Both are common. Short-term insomnia usually lasts for three months or less. Chronic insomnia occurs at least three times a week for longer than three months. CAUSES  Insomnia may be caused by another condition, situation, or substance, such as:  Anxiety.  Certain medicines.  Gastroesophageal reflux disease (GERD) or other gastrointestinal conditions.  Asthma or other breathing conditions.  Restless legs syndrome, sleep apnea, or other sleep disorders.  Chronic pain.  Menopause. This may include hot flashes.  Stroke.  Abuse of alcohol, tobacco, or illegal drugs.  Depression.  Caffeine.   Neurological disorders, such as Alzheimer disease.  An overactive thyroid (hyperthyroidism). The cause of insomnia may not be known. RISK FACTORS Risk factors for insomnia include:  Gender. Women are more commonly affected than men.  Age. Insomnia is more common as you get older.  Stress. This may involve your professional or personal life.  Income. Insomnia is more common in people with lower income.  Lack of exercise.   Irregular work schedule or night shifts.  Traveling between different time zones. SIGNS AND SYMPTOMS If you have insomnia, trouble falling asleep or trouble staying asleep is the main symptom. This may lead to other symptoms, such as:  Feeling fatigued.  Feeling nervous about going to sleep.  Not feeling rested in the morning.  Having trouble  concentrating.  Feeling irritable, anxious, or depressed. TREATMENT  Treatment for insomnia depends on the cause. If your insomnia is caused by an underlying condition, treatment will focus on addressing the condition. Treatment may also include:   Medicines to help you sleep.  Counseling or therapy.  Lifestyle adjustments. HOME CARE INSTRUCTIONS   Take medicines only as directed by your health care provider.  Keep regular sleeping and waking hours. Avoid naps.  Keep a sleep diary to help you and your health care provider figure out what could be causing your insomnia. Include:   When you sleep.  When you wake up during the night.  How well you sleep.   How rested you feel the next day.  Any side effects of medicines you are taking.  What you eat and drink.   Make your bedroom a comfortable place where it is easy to fall asleep:  Put up shades or special blackout curtains to block light from outside.  Use a white noise machine to block noise.  Keep the temperature cool.   Exercise regularly as directed by your health care provider. Avoid exercising right before bedtime.  Use relaxation techniques to manage stress. Ask your health care provider to suggest some techniques that may work well for you. These may include:  Breathing exercises.  Routines to release muscle tension.  Visualizing peaceful scenes.  Cut back on alcohol, caffeinated beverages, and cigarettes, especially close to bedtime. These can disrupt your sleep.  Do not overeat or eat spicy foods right before bedtime. This can lead to digestive discomfort that can make it hard for you to sleep.  Limit screen use before bedtime. This includes:  Watching TV.  Using your smartphone, tablet, and computer.  Stick to a routine. This can help you fall asleep faster. Try to do a quiet activity, brush your teeth, and go to bed at the same time each night.  Get out of bed if you are still awake after 15  minutes of trying to sleep. Keep the lights down, but try reading or doing a quiet activity. When you feel sleepy, go back to bed.  Make sure that you drive carefully. Avoid driving if you feel very sleepy.  Keep all follow-up appointments as directed by your health care provider. This is important. SEEK MEDICAL CARE IF:   You are tired throughout the day or have trouble in your daily routine due to sleepiness.  You continue to have sleep problems or your sleep problems get worse. SEEK IMMEDIATE MEDICAL CARE IF:   You have serious thoughts about hurting yourself or someone else.   This information is not intended to replace advice given to you by your health care provider. Make sure you discuss any questions you have with your health care provider.   Document Released: 05/23/2000 Document Revised: 02/14/2015 Document Reviewed: 02/24/2014 Elsevier Interactive Patient Education [employer].

## 2016-03-26 NOTE — Progress Notes (Signed)
Name: Erika Martinez   MRN: [id number]    DOB: 1955/12/30   Date:03/26/2016       Progress Note  Subjective  Chief Complaint  Chief Complaint  Patient presents with  . Allergic Rhinitis     cough,sneezing,stuffiness and congestion for 2 weeks not getting better also sinus headaches  . Medication Problem    coming off of xanax and needs another medication to replace it    HPI  Pulmonary fibrosis: she was diagnosed in 09/2013 secondary to scleroderma, she see pulmonologist, Dr. Lake Bells. She was denied lung transplant  last year because she was HCV positive, she has been treated with Havoni and in June 2016 titer was negative, and repeat titer negative 05/2015. She does not have liver cirrhosis. She is on oxygen 2-3 liters at rest and advised by pulmonologist to 4-6 liters during activity ( at the gym ), she is now on Imuran, and bactrin three times weekly, weaning off prednisone and is down to 5 mg daily since 02/2016 . She has been approved for the lung transplant, but need to be listed once she loses 7 lbs to get to a goal weight of 140 lbs, wean off BZD completely and have $15.000 in her bank. She has lost 2 more  lbs since last visit, she is weaning self off Alprazolam and is down to half pill prn at night. 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 She has a total of over 10000 at this time  Major Depression: she feels tired, she has anhedonia, she takes care of herself, able to get out of the house, however she thinks not necessarily from depression, but because she works during the day and is very tired at night, frustrated because she has  to drag oxygen tank around and anxious about her future, but able to calm herself down. She states that she is stressed about all the [facility] evaluation for her lung transplant. 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 No suicidal thoughts or ideation,  and no longer as scared about lung transplant now  AR: worse over the past couple of weeks.  Positive for sneezing , worsening of cough and sneezing, also some facial pressure and nasal congestion. She is immunosuppressed, explained symptoms likely from [place], has immunosuppression and pulmonary fibrosis so we will start a Zpack     HTN: taking cardizem, no recent episodes of palpitation. No chest pain   Insomnia: stop Alprazolam, discussed sleep hygiene  Patient Active Problem List   Diagnosis Date Noted  . Raynaud disease 11/26/2015  . Awaiting organ transplant status 08/29/2015  . Decreased GFR 07/24/2015  . Mild major depression ([place]) 06/07/2015  . Hypertension, benign 04/06/2015  . History of hepatitis C 02/02/2015  . Insomnia 02/02/2015  . Gastro-esophageal reflux disease without esophagitis 02/02/2015  . IBS (irritable bowel syndrome) 02/02/2015  . Low serum cobalamin 02/02/2015  . Dependence on supplemental oxygen 02/02/2015  . Perennial allergic rhinitis with seasonal variation 02/02/2015  . Scleroderma ([place]) 02/02/2015  . SPL (spondylolisthesis) 02/02/2015  . Supraventricular tachycardia ([place]) 02/02/2015  . Vitamin D deficiency 02/02/2015  . Anxiety 02/02/2015  . History of pneumococcal pneumonia 09/13/2013  . Chronic hypoxemic respiratory failure ([place]) 09/13/2013  . Polycythemia, secondary 09/13/2013  . Postinflammatory pulmonary fibrosis ([employer]) 09/13/2013    Past Surgical History:  Procedure Laterality Date  . ABDOMINAL HYSTERECTOMY    .  VAGINAL HYSTERECTOMY      Family History  Problem Relation Age of Onset  . Lung cancer Father   . Cancer Father   . Alzheimer's disease Mother   . Breast cancer Neg Hx     Social History   Social History  . Marital status: Divorced    Spouse name: N/A  . Number of children: N/A  . Years of education: N/A   Occupational History  . Not on file.   Social History Main Topics  . Smoking status: Never Smoker  .  Smokeless tobacco: Never Used  . Alcohol use 2.0 oz/week    4 Standard drinks or equivalent per week     Comment: "social drinker"  . Drug use: No  . Sexual activity: Not on file   Other Topics Concern  . Not on file   Social History Narrative  . No narrative on file     Current Outpatient Prescriptions:  .  ADVAIR DISKUS 500-50 MCG/DOSE AEPB, INHALE 1 PUFF INTO THE LUNGS 2 (TWO) TIMES DAILY., Disp: 60 each, Rfl: 5 .  albuterol (PROVENTIL) (2.5 MG/3ML) 0.083% nebulizer solution, Take 3 mLs (2.5 mg total) by nebulization every 4 (four) hours as needed for wheezing or shortness of breath. DX Code: J84.10, Disp: 75 mL, Rfl: 12 .  azaTHIOprine (IMURAN) 50 MG tablet, Take 2 tablets (100 mg total) by mouth daily. Daily, Disp: 60 tablet, Rfl: 5 .  azithromycin (ZITHROMAX Z-PAK) 250 MG tablet, Take as needed, Disp: 6 each, Rfl: 0 .  cholecalciferol (VITAMIN D) 1000 UNITS tablet, Take 2,000 Units by mouth daily., Disp: , Rfl:  .  diltiazem (CARDIZEM) 60 MG tablet, TAKE 1 TABLET (60 MG TOTAL) BY MOUTH DAILY., Disp: 30 tablet, Rfl: 2 .  fluconazole (DIFLUCAN) 150 MG tablet, TAKE 1 TABLET WEEKLY AS NEEDED FOR THRUSH, Disp: 4 tablet, Rfl: 2 .  fluticasone (FLONASE) 50 MCG/ACT nasal spray, Place 2 sprays into both nostrils daily., Disp: 16 g, Rfl: 6 .  loratadine (CLARITIN) 10 MG tablet, TAKE 1 TABLET (10 MG TOTAL) BY MOUTH 2 (TWO) TIMES DAILY., Disp: 60 tablet, Rfl: 3 .  nystatin (MYCOSTATIN) 100000 UNIT/ML suspension, TAKE 1 TABLESPOON BY MOUTH BEFORE MEALS AND AT BEDTIME (SWISH AND SPIT OUT), Disp: 473 mL, Rfl: 2 .  omeprazole (PRILOSEC) 40 MG capsule, TAKE 1 CAPSULE TWICE DAILY, Disp: 60 capsule, Rfl: 2 .  PEG 3350-KCl-NaBcb-NaCl-NaSulf (PEG-3350/ELECTROLYTES) 236 g SOLR, Take by mouth., Disp: , Rfl:  .  predniSONE (DELTASONE) 10 MG tablet, Take 0.5 tablets (5 mg total) by mouth daily with breakfast., Disp: 30 tablet, Rfl: 0 .  sulfamethoxazole-trimethoprim (BACTRIM DS,SEPTRA DS) 800-160 MG  tablet, TAKE 1 TABLET BY MOUTH 3 (THREE) TIMES A WEEK., Disp: , Rfl: 5 .  VENTOLIN HFA 108 (90 Base) MCG/ACT inhaler, INHALE 2 PUFFS INTO THE LUNGS EVERY 4 (FOUR) HOURS AS NEEDED FOR WHEEZING OR SHORTNESS OF BREATH., Disp: 18 g, Rfl: 5 .  vitamin B-12 (CYANOCOBALAMIN) 250 MCG tablet, Place 250 mcg under the tongue every other day. , Disp: , Rfl:   No Known Allergies   ROS  Constitutional: Negative for fever or significant weight change.  Respiratory: Positive for cough and shortness of breath.   Cardiovascular: Negative for chest pain or palpitations.  Gastrointestinal: Negative for abdominal pain, no bowel changes.  Musculoskeletal: Negative for gait problem or joint swelling.  Skin: Negative for rash.  Neurological: Negative for dizziness or headache.  No other specific complaints in a complete review of systems (except as listed  in HPI above).  Objective  Vitals:   03/26/16 1044 03/26/16 1047  BP: 122/64   Pulse: (!) 101   Resp: 16   Temp: 99.1 F (37.3 C)   SpO2: (!) 85% 97%  Weight: 145 lb (65.8 kg)   Height: 5\' 1"  (1.549 m)     Body mass index is 27.4 kg/m.  Physical Exam  Constitutional: Patient appears well-developed  Obese  HEENT: head atraumatic, normocephalic, pupils equal and reactive to light, neck supple, throat within normal limits, boggy turbinates, no tenderness during palpation of sinus  Cardiovascular: Normal rate, regular rhythm and normal heart sounds. No murmur heard.1 plus BLE edema. Pulmonary/Chest: Effort normal very fine end inspiratory crackles stable on both lung fields, lower third  Abdominal: Soft. There is no tenderness. Psychiatric: Patient has a normal mood and affect. behavior is normal. Judgment and thought content normal. Skin violaceous nail beds  Recent Results (from the past 2160 hour(s))  HM COLONOSCOPY     Status: None   Collection Time: 02/28/16 12:00 AM  Result Value Ref Range   HM Colonoscopy See Report (in chart) See  Report (in chart), Patient Reported    Comment: Perianal & rectal examinations were nl, multiple diverticula    PHQ2/9: Depression screen [facility] 2/9 03/26/2016 12/14/2015 10/03/2015 07/03/2015 04/06/2015  Decreased Interest 0 1 0 0 0  Down, Depressed, Hopeless 0 1 0 0 0  PHQ - 2 Score 0 2 0 0 0  Altered sleeping - 1 - - -  Tired, decreased energy - 2 - - -  Change in appetite - 2 - - -  Feeling bad or failure about yourself  - 1 - - -  Trouble concentrating - 0 - - -  Moving slowly or fidgety/restless - 0 - - -  Suicidal thoughts - 0 - - -  PHQ-9 Score - 8 - - -  Difficult doing work/chores - Somewhat difficult - - -     Fall Risk: Fall Risk  03/26/2016 12/14/2015 10/03/2015 07/03/2015 111/29/2016  Falls in the past year? No No No No No      Functional Status Survey: Is the patient deaf or have difficulty hearing?: No Does the patient have difficulty seeing, even when wearing glasses/contacts?: No Does the patient have difficulty concentrating, remembering, or making decisions?: No Does the patient have difficulty walking or climbing stairs?: Yes Does the patient have difficulty dressing or bathing?: No Does the patient have difficulty doing errands alone such as visiting a doctor's office or shopping?: No    Assessment & Plan  1. Pulmonary fibrosis (HCC)  Sees Dr. Lake Bells and is down to 5 mg daily and also on SMT/TMP for infection prevention   2. Awaiting organ transplant status  Going to [facility], needs to stop alprazolam  3. Anxiety  She states she has difficulty sleeping because she feels anxious at night, discussed importance of only going to the bedroom to sleep   4. Immunosuppression ([place])  On prednisone  5. Mild major depression ([place])  She has some anhedonia, she does not want to take medication daily  6. Chronic hypoxemic respiratory failure (HCC)  On oxygen  7. S/P colonoscopic polypectomy  Going back soon, but so far negative for cancer  8. Perennial  allergic rhinitis with seasonal variation  Likely the cause of her sneezing, clear rhinorrhea and increase in cough from post-nasal drip. Discussed need to use nasal steroid, try saline spray and take Loratadine  9. Needs flu shot  - Flu  Vaccine QUAD 36+ mos IM  10. Cough  - azithromycin (ZITHROMAX Z-PAK) 250 MG tablet; Take as needed  Dispense: 6 each; Refill: 0  11. Dry skin  - triamcinolone cream (KENALOG) 0.1 %; Apply 1 application topically 2 (two) times daily.  Dispense: 30 g; Refill: 0

## 2016-03-31 ENCOUNTER — Telehealth: Payer: Self-pay | Admitting: Pulmonary Disease

## 2016-03-31 NOTE — Telephone Encounter (Signed)
Spoke with pt and advised of Dr Anastasia Pall recommendations.  Pt verbalized understanding.  Nothing further needed.

## 2016-03-31 NOTE — Telephone Encounter (Signed)
Pt seen primary md on 03/26/16 and was started on Zpak for sinus infection and upper respiratory infection. She is still on Pred 5mg /day and wanted to know if she can increase this while on the abx.  Please advise

## 2016-03-31 NOTE — Telephone Encounter (Signed)
10mg  daily x 5 days then back down to 5mg 

## 2016-04-02 ENCOUNTER — Encounter: Payer: Self-pay | Admitting: *Deleted

## 2016-04-02 DIAGNOSIS — J8410 Pulmonary fibrosis, unspecified: Secondary | ICD-10-CM

## 2016-04-04 ENCOUNTER — Encounter: Payer: 59 | Admitting: *Deleted

## 2016-04-04 DIAGNOSIS — J8410 Pulmonary fibrosis, unspecified: Secondary | ICD-10-CM | POA: Diagnosis not present

## 2016-04-04 NOTE — Progress Notes (Signed)
Daily Session Note  Patient Details  Name: PEDRO OLDENBURG MRN: [id number] Date of Birth: [birth year] Referring Provider:   April Manson Pulmonary Rehab from 12/14/2015 in [facility]  Referring Provider  [place]      Encounter Date: 04/04/2016  Check In:     Session Check In - 04/04/16 1233      Check-In   Location [facility]-Cardiac & Pulmonary Rehab   Staff Present Heath Lark, RN, BSN, CCRP;Jessica Delta, MA, ACSM RCEP, Exercise Physiologist;Laureen Owens Shark, BS, RRT, Respiratory Therapist   Supervising physician immediately available to respond to emergencies [name] immediately available ER MD   Physician(s) Drs: Corky Downs and Alfred Levins   Medication changes reported     No   Fall or balance concerns reported    No   Warm-up and Cool-down Performed as group-led instruction   Resistance Training Performed Yes   VAD Patient? No     Pain Assessment   Currently in Pain? No/denies         Goals Met:  Independence with exercise equipment Using PLB without cueing & demonstrates good technique Exercise tolerated well Strength training completed today  Goals Unmet:  Not Applicable  Comments: Doing well with exercise prescription progression.    Dr. Emily Filbert is Medical Director for [place] and [name].

## 2016-04-07 ENCOUNTER — Encounter: Payer: Self-pay | Admitting: *Deleted

## 2016-04-07 DIAGNOSIS — J8410 Pulmonary fibrosis, unspecified: Secondary | ICD-10-CM

## 2016-04-07 NOTE — Progress Notes (Signed)
Pulmonary Individual Treatment Plan  Patient Details  Name: Erika Martinez MRN: [id number] Date of Birth: February 11, 1956 Referring Provider:   April Manson Pulmonary Rehab from 12/14/2015 in [facility] Cardiac and Pulmonary Rehab  Referring Provider  McQuaid      Initial Encounter Date:  Flowsheet Row Pulmonary Rehab from 12/14/2015 in [facility] Cardiac and Pulmonary Rehab  Date  12/14/15  Referring Provider  McQuaid      Visit Diagnosis: Pulmonary fibrosis ([place])  Patient's Home Medications on Admission:  Current Outpatient Prescriptions:  .  ADVAIR DISKUS 500-50 MCG/DOSE AEPB, INHALE 1 PUFF INTO THE LUNGS 2 (TWO) TIMES DAILY., Disp: 60 each, Rfl: 5 .  albuterol (PROVENTIL) (2.5 MG/3ML) 0.083% nebulizer solution, Take 3 mLs (2.5 mg total) by nebulization every 4 (four) hours as needed for wheezing or shortness of breath. DX Code: J84.10, Disp: 75 mL, Rfl: 12 .  azaTHIOprine (IMURAN) 50 MG tablet, Take 2 tablets (100 mg total) by mouth daily. Daily, Disp: 60 tablet, Rfl: 5 .  azithromycin (ZITHROMAX Z-PAK) 250 MG tablet, Take as needed, Disp: 6 each, Rfl: 0 .  cholecalciferol (VITAMIN D) 1000 UNITS tablet, Take 2,000 Units by mouth daily., Disp: , Rfl:  .  diltiazem (CARDIZEM) 60 MG tablet, TAKE 1 TABLET (60 MG TOTAL) BY MOUTH DAILY., Disp: 30 tablet, Rfl: 2 .  fluconazole (DIFLUCAN) 150 MG tablet, TAKE 1 TABLET WEEKLY AS NEEDED FOR THRUSH, Disp: 4 tablet, Rfl: 2 .  fluticasone (FLONASE) 50 MCG/ACT nasal spray, Place 2 sprays into both nostrils daily., Disp: 16 g, Rfl: 6 .  loratadine (CLARITIN) 10 MG tablet, TAKE 1 TABLET (10 MG TOTAL) BY MOUTH 2 (TWO) TIMES DAILY., Disp: 60 tablet, Rfl: 3 .  nystatin (MYCOSTATIN) 100000 UNIT/ML suspension, TAKE 1 TABLESPOON BY MOUTH BEFORE MEALS AND AT BEDTIME (SWISH AND SPIT OUT), Disp: 473 mL, Rfl: 2 .  omeprazole (PRILOSEC) 40 MG capsule, TAKE 1 CAPSULE TWICE DAILY, Disp: 60 capsule, Rfl: 2 .  PEG 3350-KCl-NaBcb-NaCl-NaSulf (PEG-3350/ELECTROLYTES) 236 g SOLR,  Take by mouth., Disp: , Rfl:  .  predniSONE (DELTASONE) 10 MG tablet, Take 0.5 tablets (5 mg total) by mouth daily with breakfast., Disp: 30 tablet, Rfl: 0 .  sulfamethoxazole-trimethoprim (BACTRIM DS,SEPTRA DS) 800-160 MG tablet, TAKE 1 TABLET BY MOUTH 3 (THREE) TIMES A WEEK., Disp: , Rfl: 5 .  triamcinolone cream (KENALOG) 0.1 %, Apply 1 application topically 2 (two) times daily., Disp: 30 g, Rfl: 0 .  VENTOLIN HFA 108 (90 Base) MCG/ACT inhaler, INHALE 2 PUFFS INTO THE LUNGS EVERY 4 (FOUR) HOURS AS NEEDED FOR WHEEZING OR SHORTNESS OF BREATH., Disp: 18 g, Rfl: 5 .  vitamin B-12 (CYANOCOBALAMIN) 250 MCG tablet, Place 250 mcg under the tongue every other day. , Disp: , Rfl:   Past Medical History: Past Medical History:  Diagnosis Date  . Abnormal hemoglobin (HCC)   . Allergy   . Anxiety   . Asthma   . Controlled insomnia   . Erythrocytosis   . GERD (gastroesophageal reflux disease)   . Hep C w/o coma, chronic ([place])   . Hypertension   . IBS (irritable bowel syndrome)   . Lumbago   . Mitral valve prolapse   . Oxygen dependent   . Plantar wart   . Pulmonary fibrosis ([place])   . Scleroderma ([place])    Dr. Pennie Banter  . Seasonal allergies   . Spondylolisthesis   . Symptomatic menopausal or female climacteric states     Tobacco Use: History  Smoking Status  . Never Smoker  Smokeless Tobacco  . Never Used    Labs: Recent Review Flowsheet Data    Labs for ITP Cardiac and Pulmonary Rehab Latest Ref Rng & Units 07/14/2013 07/05/2015 08/02/2015   Cholestrol 100 - 199 mg/dL 169 - 196   LDLCALC 0 - 99 mg/dL 95 - 112(H)   HDL >39 mg/dL 58 - 68   Trlycerides 0 - 149 mg/dL 78 - 78   Hemoglobin A1c 4.8 - 5.6 % - 6.3(H) -       ADL UCSD:     Pulmonary Assessment Scores    Row Name 12/14/15 1350         ADL UCSD   ADL Phase Entry     SOB Score total 43     Rest 0     Walk 2     Stairs 4     Bath 1     Dress 1     Shop 2        Pulmonary Function Assessment:   Exercise  Target Goals:    Exercise Program Goal: Individual exercise prescription set with THRR, safety & activity barriers. Participant demonstrates ability to understand and report RPE using BORG scale, to self-measure pulse accurately, and to acknowledge the importance of the exercise prescription.  Exercise Prescription Goal: Starting with aerobic activity 30 plus minutes a day, 3 days per week for initial exercise prescription. Provide home exercise prescription and guidelines that participant acknowledges understanding prior to discharge.  Activity Barriers & Risk Stratification:     Activity Barriers & Cardiac Risk Stratification - 12/14/15 1204      Activity Barriers & Cardiac Risk Stratification   Activity Barriers Deconditioning;Joint Problems      6 Minute Walk:     6 Minute Walk    Row Name 12/14/15 1204 12/14/15 1216 12/14/15 1301     6 Minute Walk   Phase Initial  -  -   Distance 810 feet  -  -   Walk Time 5.81 minutes  -  -   # of Rest Breaks 1  -  -   RPE 15  -  -   Perceived Dyspnea  3  "tired " on 8 liters oxgyen for 6 minute walk -   Pulse ox down to 82% at end of 6 minute walk even on 8 liters of supplemental oxygen. Pulse ox came up to 92% with rest.  3   Resting HR 80 bpm  -  -   Resting BP 122/70  -  -   Max Ex. HR 118 bpm  -  -   Max Ex. BP 138/70  -  -   Row Name 12/14/15 1302 12/14/15 1312       6 Minute Walk   Phase Initial Initial       Initial Exercise Prescription:     Initial Exercise Prescription - 12/14/15 1400      Date of Initial Exercise RX and Referring Provider   Date 12/14/15   Referring Provider McQuaid     Oxygen   Oxygen Continuous   Liters 4     Treadmill   MPH 1.4   Grade 1   Minutes 15   METs 2.26     NuStep   Level 2   Minutes 15   METs 2.3     REL-XR   Level 1   Minutes 15   METs 2.3     Prescription Details   Frequency (times per week) 3  Duration Progress to 30 minutes of continuous aerobic without  signs/symptoms of physical distress     Intensity   THRR 40-80% of Max Heartrate 111-142   Ratings of Perceived Exertion 11-13   Perceived Dyspnea 0-4     Progression   Progression Continue to progress workloads to maintain intensity without signs/symptoms of physical distress.     Resistance Training   Training Prescription Yes   Weight 2   Reps 10-15      Perform Capillary Blood Glucose checks as needed.  Exercise Prescription Changes:     Exercise Prescription Changes    Row Name 12/26/15 1400 01/09/16 1300 01/23/16 1400 02/06/16 1300 02/21/16 1100     Exercise Review   Progression Yes Yes Yes Yes Yes     Response to Exercise   Blood Pressure (Admit) 110/60 118/64 112/66 120/66 148/78   Blood Pressure (Exercise) 126/64 138/64 150/84 124/74 112/66   Blood Pressure (Exit) 108/50 140/70 124/62 104/62 106/74   Heart Rate (Admit) 106 bpm 114 bpm 110 bpm 94 bpm 100 bpm   Heart Rate (Exercise) 115 bpm 120 bpm 118 bpm 112 bpm 106 bpm   Heart Rate (Exit) 104 bpm 100 bpm 108 bpm 100 bpm 95 bpm   Oxygen Saturation (Admit) 96 % 89 % 97 % 96 % 100 %   Oxygen Saturation (Exercise) 92 % 90 % 93 % 92 % 89 %   Oxygen Saturation (Exit) 94 % 96 % 100 % 95 % 100 %   Rating of Perceived Exertion (Exercise) '13 13 13 13 13   ' Perceived Dyspnea (Exercise) '3 3 3 3 3   ' Comments  - Home Exercise Guidelines given 01/09/16  -  -  -     Progression   Progression Continue to progress workloads to maintain intensity without signs/symptoms of physical distress. Continue to progress workloads to maintain intensity without signs/symptoms of physical distress. Continue to progress workloads to maintain intensity without signs/symptoms of physical distress. Continue to progress workloads to maintain intensity without signs/symptoms of physical distress. Continue to progress workloads to maintain intensity without signs/symptoms of physical distress.     Resistance Training   Training Prescription Yes Yes  Yes Yes Yes   Weight 2 6  exercise O2 6-8L '3 3 3   ' Reps 10-15 10-15 10-15 10-15 10-12     Interval Training   Interval Training  -  - No  - No     Oxygen   Oxygen Continuous  - Continuous  - Continuous   Liters '4 2 8 8  15 ' L/min on TM 8     Treadmill   MPH 1.2 1.2 1.2 1.2 1.3   Grade 0 0.5 0 0 0   Minutes '10 10 12 25 20   ' METs  - 2 1.92 1.92 2     NuStep   Level  - 2  -  -  -   Minutes  - 15  -  -  -   METs  - 1.5  -  -  -     REL-XR   Level '3 3 3 4 4   ' Minutes '15 15 15 15 15   ' METs 1.9 2.1  - 3 2.6     Biostep-RELP   Level  - 2  -  -  -   Minutes  - 15  -  -  -   METs  - 2  -  -  -     Home  Exercise Plan   Plans to continue exercise at  - Home  Stationary bike  -  -  -   Frequency  - Add 4 additional days to program exercise sessions.  -  -  -   Row Name 03/06/16 1100 03/18/16 1400           Exercise Review   Progression Yes Yes        Response to Exercise   Blood Pressure (Admit)  - 124/68      Blood Pressure (Exercise) 160/80 152/70      Blood Pressure (Exit) 130/80 128/72      Heart Rate (Admit) 105 bpm 102 bpm      Heart Rate (Exercise) 106 bpm 115 bpm      Heart Rate (Exit) 98 bpm 99 bpm      Oxygen Saturation (Admit) 100 % 93 %      Oxygen Saturation (Exercise) 97 % 86 %      Oxygen Saturation (Exit) 99 % 100 %      Rating of Perceived Exertion (Exercise) 13 13      Perceived Dyspnea (Exercise) 3 3      Comments  - Home Exercise Guidelines given 01/09/16      Duration  - Progress to 45 minutes of aerobic exercise without signs/symptoms of physical distress      Intensity  - THRR unchanged  111-142        Progression   Progression Continue to progress workloads to maintain intensity without signs/symptoms of physical distress. Continue to progress workloads to maintain intensity without signs/symptoms of physical distress.      Average METs  - 2.45        Resistance Training   Training Prescription Yes Yes      Weight 3 3 right 2 left       Reps 10-12 10-12        Interval Training   Interval Training No No        Oxygen   Oxygen Continuous Continuous      Liters 8 8        Treadmill   MPH 1.2 1.3      Grade 0 0      Minutes 20 20      METs 1.92 2        REL-XR   Level 5 4      Minutes 15 15      METs  - 2.9        Home Exercise Plan   Plans to continue exercise at  - Home  Stationary bike      Frequency  - Add 4 additional days to program exercise sessions.         Exercise Comments:     Exercise Comments    Row Name 12/14/15 1217 12/19/15 1337 01/09/16 1312 01/09/16 1356 01/23/16 1423   Exercise Comments  Pulse ox down to 82% at end of 6 minute walk even on 8 liters of supplemental oxygen. Pulse ox came up to 92% with rest.  First full day of exercise!  Patient was oriented to gym and equipment including functions, settings, policies, and procedures.  Patient's individual exercise prescription and treatment plan were reviewed.  All starting workloads were established based on the results of the 6 minute walk test done at initial orientation visit.  The plan for exercise progression was also introduced and progression will be customized based on patient's performance and goals. Reviewed home exercise  with pt today.  Pt plans to continue riding stationary bike at home  for exercise.  We discussed her progression with exercise at home to increase her current time. Reviewed THR, pulse, RPE, sign and symptoms, and when to call 911 or MD.  Also discussed weather considerations and indoor options.  Pt voiced understanding. Erika Martinez is continuing to improve her intensity and duration of exercise. Erika Martinez continues to progress well with exercise.   [place] Name 02/06/16 1329 02/06/16 1605 02/21/16 1152 03/06/16 1119 03/18/16 1452   Exercise Comments Erika Martinez is progressing well with exercise.  She has added resistnace on the XR and more time to TM. Erika Martinez had an oxygen titration walk at [facility] for her Lung Transplant surgery, so we  will not perform a Mid57md DMayerlihas progressed well with exercise. DDonalynhas not been able to attend regularly as she is back at work. DNaryiahis trying to find a routine with her work schedule to allow her to get to rehab.  She continues to make improvements when she is here.  We will continue to monitor her progression.   RLake SummersetName 04/02/16 1551           Exercise Comments DNandanahas been out with work since last review.          Discharge Exercise Prescription (Final Exercise Prescription Changes):     Exercise Prescription Changes - 03/18/16 1400      Exercise Review   Progression Yes     Response to Exercise   Blood Pressure (Admit) 124/68   Blood Pressure (Exercise) 152/70   Blood Pressure (Exit) 128/72   Heart Rate (Admit) 102 bpm   Heart Rate (Exercise) 115 bpm   Heart Rate (Exit) 99 bpm   Oxygen Saturation (Admit) 93 %   Oxygen Saturation (Exercise) 86 %   Oxygen Saturation (Exit) 100 %   Rating of Perceived Exertion (Exercise) 13   Perceived Dyspnea (Exercise) 3   Comments Home Exercise Guidelines given 01/09/16   Duration Progress to 45 minutes of aerobic exercise without signs/symptoms of physical distress   Intensity THRR unchanged  111-142     Progression   Progression Continue to progress workloads to maintain intensity without signs/symptoms of physical distress.   Average METs 2.45     Resistance Training   Training Prescription Yes   Weight 3 right 2 left   Reps 10-12     Interval Training   Interval Training No     Oxygen   Oxygen Continuous   Liters 8     Treadmill   MPH 1.3   Grade 0   Minutes 20   METs 2     REL-XR   Level 4   Minutes 15   METs 2.9     Home Exercise Plan   Plans to continue exercise at HHatley4 additional days to program exercise sessions.       Nutrition:  Target Goals: Understanding of nutrition guidelines, daily intake of sodium <15017m cholesterol <20072mcalories 30% from fat  and 7% or less from saturated fats, daily to have 5 or more servings of fruits and vegetables.  Biometrics:     Pre Biometrics - 12/14/15 1321      Pre Biometrics   Height 5' 1.2" (1.554 m)   Weight 151 lb 11.2 oz (68.8 kg)   Waist Circumference 42 inches   Hip Circumference 39.8 inches   Waist to Hip Ratio 1.06 %  BMI (Calculated) 28.5   Single Leg Stand 30 seconds       Nutrition Therapy Plan and Nutrition Goals:     Nutrition Therapy & Goals - 12/14/15 1226      Nutrition Therapy   Diet --  Erika Martinez wants to meet with the Registered Dietician to help her lose weight. Erika Martinez has cut out bread and sodas.       Nutrition Discharge: Rate Your Plate Scores:   Psychosocial: Target Goals: Acknowledge presence or absence of depression, maximize coping skills, provide positive support system. Participant is able to verbalize types and ability to use techniques and skills needed for reducing stress and depression.  Initial Review & Psychosocial Screening:     Initial Psych Review & Screening - 12/14/15 [place]? Yes      Quality of Life Scores:     Quality of Life - 12/14/15 1323      Quality of Life Scores   Health/Function Pre 20.69 %   Socioeconomic Pre 20.38 %   Psych/Spiritual Pre 21 %   Family Pre 20 %   GLOBAL Pre 20.58 %      PHQ-9: Recent Review Flowsheet Data    Depression screen [facility] 2/9 03/26/2016 12/14/2015 10/03/2015 07/03/2015 04/06/2015   Decreased Interest 0 1 0 0 0   Down, Depressed, Hopeless 0 1 0 0 0   PHQ - 2 Score 0 2 0 0 0   Altered sleeping - 1 - - -   Tired, decreased energy - 2 - - -   Change in appetite - 2 - - -   Feeling bad or failure about yourself  - 1 - - -   Trouble concentrating - 0 - - -   Moving slowly or fidgety/restless - 0 - - -   Suicidal thoughts - 0 - - -   PHQ-9 Score - 8 - - -   Difficult doing work/chores - Somewhat difficult - - -      Psychosocial Evaluation and  Intervention:     Psychosocial Evaluation - 01/07/16 1231      Psychosocial Evaluation & Interventions   Comments Counselor met with Erika Martinez today as she has reutrned to this program.   She is 60 years old and reports she is in the lung transplant process to see if she will be chosen.  Erika. Martinez has Pulmonary Fibrosis.  She has a strong support system with her sister and a daughter who live close by.  She is actively involved in her local church community.  Erika. Martinez states she sleeps "okay" with some nights needing to take a prescribed medication to help with her anxiety.  She states her appetite is good most of the time and she denies a history of depression, but some anxiety which is being treated.  Erika. 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  Erika. Martinez goals for this program are to increase her stamina and strength.  Counselor will continue to follow with her during the course of this program.         Psychosocial Re-Evaluation:     Psychosocial Re-Evaluation    Row Name 01/21/16 1655 03/05/16 1309 03/19/16 1027         Psychosocial Re-Evaluation   Comments Counselor follow up with Erika. Bobbye Martinez today.  She  reports doing well and enjoying this program; including experiencing benefits of increased stamina.  She reports being approved for the lung transplant list and has had to go through multiple tests and meet extensive criteria for this to occur.  Erika. Martinez reports this is stressful for her currently as well as school starting back in a few weeks.  Erika. Martinez reports sleeping "okay" currently and hopes that will continue, although she has to wean off her medications that have helped her sleep PRN.  Counselor will continue to follow with Erika. Martinez throughout the course of this program.  Patient psychosocial assessment reveals no barriers to participation in  Pulmonary Rehab. Psychosocial areas that are currently affecting patient's rehab experience include concerns about , financial resources,   And Work. Patient does continue to exhibit positive  coping skills to deal with her psychosocial concerns. Offered emotional support and reassurance. Patient does feel she is making progress toward Pulmonary Rehab goals. Patient reports her health and activity level has improved in the past 30 days as evidenced by patient's report of increased ability to manage her daily activities and return to work for an eight hour day..  Patient reports  feeling positive about current and projected progression in Pulmonary Rehab. After reviewing the patient's treatment plan, the patient is making progress toward Pulmonary Rehab goals. Patient's rate of progress toward rehab goals is good. Plan of action to help patient continue to work towards rehab goals include expecting her to be able to attend program 3 days a week even though she has returned to work. Watching progress towards her ability to be placed on the transplant list. Erika Martinez has raised almost 9 thousand dollars, has stopped taking the Xanax and is close to her weight loss goal required to be placed on the list.  Will continue to monitor and evaluate progress toward psychosocial goal(s). Counselor follow up with Erika. Martinez on this date reporting dealing with a great deal of stress in regards to being on the transplant list.  Although she has raised a great deal of the funds required, there is still some to go; she also has had to stop taking her Xanax which has been helpful for dealing with stress and anxiety as well as sleep.  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  Erika. 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  She continues  to have a positive outlook and reports her faith and exercise and leaning on friends help her cope with all that she is currently going through.  Counselor encouraged her to continue to practice her stress management skills and offered support and encouragement for all the progress made so far in this process.         Education: Education Goals: Education classes will be provided on a weekly basis, covering required topics. Participant will state understanding/return demonstration of topics presented.  Learning Barriers/Preferences:     Learning Barriers/Preferences - 12/14/15 1204      Learning Barriers/Preferences   Learning Barriers Sight  wears reading glasses      Education Topics: Initial Evaluation Education: - Verbal, written and demonstration of respiratory meds, RPE/PD scales, oximetry and breathing techniques. Instruction on use of nebulizers and MDIs: cleaning and proper use, rinsing mouth with steroid doses and importance of  monitoring MDI activations. Flowsheet Row Documentation from 03/14/2015 in [place]  Date  12/05/14  Educator  LB  Instruction Review Code  2- meets goals/outcomes      General Nutrition Guidelines/Fats and Fiber: -Group instruction provided by verbal, written material, models and posters to present the general guidelines for heart healthy nutrition. Gives an explanation and review of dietary fats and fiber. Flowsheet Row Pulmonary Rehab from 02/01/2016 in [facility] Cardiac and Pulmonary Rehab  Date  12/31/15  Educator  CR  Instruction Review Code  2- meets goals/outcomes      Controlling Sodium/Reading Food Labels: -Group verbal and written material supporting the discussion of sodium use in heart healthy nutrition. Review and explanation with models, verbal and written materials for utilization of the food label. Flowsheet Row Pulmonary Rehab from 02/01/2016 in [facility] Cardiac and Pulmonary Rehab  Date  01/07/16   Educator  CR  Instruction Review Code  2- meets goals/outcomes      Exercise Physiology & Risk Factors: - Group verbal and written instruction with models to review the exercise physiology of the cardiovascular system and associated critical values. Details cardiovascular disease risk factors and the goals associated with each risk factor. Flowsheet Row Pulmonary Rehab from 02/01/2016 in [facility] Cardiac and Pulmonary Rehab  Date  12/26/15  Educator  Alberteen Sam and Nada Maclachlan   Instruction Review Code  2- meets goals/outcomes      Aerobic Exercise & Resistance Training: - Gives group verbal and written discussion on the health impact of inactivity. On the components of aerobic and resistive training programs and the benefits of this training and how to safely progress through these programs.   Flexibility, Balance, General Exercise Guidelines: - Provides group verbal and written instruction on the benefits of flexibility and balance training programs. Provides general exercise guidelines with specific guidelines to those with heart or lung disease. Demonstration and skill practice provided.   Stress Management: - Provides group verbal and written instruction about the health risks of elevated stress, cause of high stress, and healthy ways to reduce stress. Flowsheet Row Pulmonary Rehab from 02/01/2016 in [facility] Cardiac and Pulmonary Rehab  Date  12/19/15  Educator  Kathreen Cornfield  Instruction Review Code  2- meets goals/outcomes      Depression: - Provides group verbal and written instruction on the correlation between heart/lung disease and depressed mood, treatment options, and the stigmas associated with seeking treatment.   Exercise & Equipment Safety: - Individual verbal instruction and demonstration of equipment use and safety with use of the equipment. Flowsheet Row Pulmonary Rehab from 02/01/2016 in [facility] Cardiac and Pulmonary Rehab  Date  12/14/15  Educator  C. EnterkinRN   Instruction Review Code  1- partially meets, needs review/practice      Infection Prevention: - Provides verbal and written material to individual with discussion of infection control including proper hand washing and proper equipment cleaning during exercise session. Flowsheet Row Pulmonary Rehab from 02/01/2016 in [facility] Cardiac and Pulmonary Rehab  Date  12/14/15  Educator  C. EnterkinRN  Instruction Review Code  2- meets goals/outcomes      Falls Prevention: - Provides verbal and written material to individual with discussion of falls prevention and safety. Flowsheet Row Pulmonary Rehab from 02/01/2016 in [facility] Cardiac and Pulmonary Rehab  Date  12/14/15  Educator  C. EnterkinRN  Instruction Review Code  2- meets goals/outcomes      Diabetes: - Individual verbal and written instruction to review signs/symptoms of  diabetes, desired ranges of glucose level fasting, after meals and with exercise. Advice that pre and post exercise glucose checks will be done for 3 sessions at entry of program.   Chronic Lung Diseases: - Group verbal and written instruction to review new updates, new respiratory medications, new advancements in procedures and treatments. Provide informative websites and "800" numbers of self-education. Flowsheet Row Pulmonary Rehab from 02/01/2016 in [facility] Cardiac and Pulmonary Rehab  Date  12/24/15  Educator  LB  Instruction Review Code  2- meets goals/outcomes      Lung Procedures: - Group verbal and written instruction to describe testing methods done to diagnose lung disease. Review the outcome of test results. Describe the treatment choices: Pulmonary Function Tests, ABGs and oximetry. Flowsheet Row Documentation from 03/14/2015 in [place]  Date  01/12/15  Educator  sj  Instruction Review Code  2- meets goals/outcomes      Energy Conservation: - Provide group verbal and written instruction for methods to conserve  energy, plan and organize activities. Instruct on pacing techniques, use of adaptive equipment and posture/positioning to relieve shortness of breath. Flowsheet Row Documentation from 03/14/2015 in [place]  Date  01/24/15  Educator  SW  Instruction Review Code  2- meets goals/outcomes      Triggers: - Group verbal and written instruction to review types of environmental controls: home humidity, furnaces, filters, dust mite/pet prevention, HEPA vacuums. To discuss weather changes, air quality and the benefits of nasal washing. Flowsheet Row Documentation from 03/14/2015 in [place]  Date  12/18/14  Educator  LB  Instruction Review Code  2- meets goals/outcomes      Exacerbations: - Group verbal and written instruction to provide: warning signs, infection symptoms, calling MD promptly, preventive modes, and value of vaccinations. Review: effective airway clearance, coughing and/or vibration techniques. Create an Sports administrator. Flowsheet Row Pulmonary Rehab from 02/01/2016 in [facility] Cardiac and Pulmonary Rehab  Date  01/21/16  Educator  LB  Instruction Review Code  2- meets goals/outcomes      Oxygen: - Individual and group verbal and written instruction on oxygen therapy. Includes supplement oxygen, available portable oxygen systems, continuous and intermittent flow rates, oxygen safety, concentrators, and Medicare reimbursement for oxygen. Flowsheet Row Documentation from 03/14/2015 in [place]  Date  12/05/14  Educator  LB  Instruction Review Code  2- meets goals/outcomes      Respiratory Medications: - Group verbal and written instruction to review medications for lung disease. Drug class, frequency, complications, importance of spacers, rinsing mouth after steroid MDI's, and proper cleaning methods for nebulizers. Flowsheet Row Documentation from 03/14/2015 in  [place]  Date  12/22/14  Educator  [place]  Instruction Review Code  2- meets goals/outcomes [Spacer and instructions given to pt. ]      AED/CPR: - Group verbal and written instruction with the use of models to demonstrate the basic use of the AED with the basic ABC's of resuscitation. Flowsheet Row Pulmonary Rehab from 02/01/2016 in [facility] Cardiac and Pulmonary Rehab  Date  01/11/16  Educator  CE  Instruction Review Code  2- meets goals/outcomes      Breathing Retraining: - Provides individuals verbal and written instruction on purpose, frequency, and proper technique of diaphragmatic breathing and pursed-lipped breathing. Applies individual practice skills. Flowsheet Row Pulmonary Rehab from 02/01/2016 in [facility] Cardiac and Pulmonary Rehab  Date  12/19/15  Educator  [facility]  Instruction Review Code  2- meets goals/outcomes      Anatomy and Physiology of the Lungs: - Group verbal and written instruction with the use of models to provide basic lung anatomy and physiology related to function, structure and complications of lung disease. Flowsheet Row Pulmonary Rehab from 02/01/2016 in [facility] Cardiac and Pulmonary Rehab  Date  02/01/16  Educator  [place]  Instruction Review Code  2- meets goals/outcomes      Heart Failure: - Group verbal and written instruction on the basics of heart failure: signs/symptoms, treatments, explanation of ejection fraction, enlarged heart and cardiomyopathy. Flowsheet Row Pulmonary Rehab from 02/01/2016 in [facility] Cardiac and Pulmonary Rehab  Date  01/25/16  Educator  CE  Instruction Review Code  2- meets goals/outcomes      Sleep Apnea: - Individual verbal and written instruction to review Obstructive Sleep Apnea. Review of risk factors, methods for diagnosing and types of masks and machines for OSA.   Anxiety: - Provides group, verbal and written instruction on the correlation between heart/lung disease and anxiety, treatment  options, and management of anxiety. Flowsheet Row Documentation from 03/14/2015 in [place]  Date  01/31/15  Educator  [facility]  Instruction Review Code  2- Meets goals/outcomes      Relaxation: - Provides group, verbal and written instruction about the benefits of relaxation for patients with heart/lung disease. Also provides patients with examples of relaxation techniques. Flowsheet Row Pulmonary Rehab from 02/01/2016 in [facility] Cardiac and Pulmonary Rehab  Date  01/16/16  Educator  Kathreen Cornfield, [facility]  Instruction Review Code  2- Meets goals/outcomes      Knowledge Questionnaire Score:     Knowledge Questionnaire Score - 12/14/15 1324      Knowledge Questionnaire Score   Pre Score 9       Core Components/Risk Factors/Patient Goals at Admission:     Personal Goals and Risk Factors at Admission - 12/14/15 1209      Core Components/Risk Factors/Patient Goals on Admission    Weight Management Yes   Intervention Weight Management: Develop a combined nutrition and exercise program designed to reach desired caloric intake, while maintaining appropriate intake of nutrient and fiber, sodium and fats, and appropriate energy expenditure required for the weight goal.;Weight Management: Provide education and appropriate resources to help participant work on and attain dietary goals.;Weight Management/Obesity: Establish reasonable short term and long term weight goals.;Obesity: Provide education and appropriate resources to help participant work on and attain dietary goals.   Admit Weight --  On prednisone currently. Erika Martinez reports she needs to lose 10 lbs for her potential [facility]   Goal Weight: Short Term 140 lb (63.5 kg)   Goal Weight: Long Term 125 lb (56.7 kg)   Expected Outcomes Short Term: Continue to assess and modify interventions until short term weight is achieved;Weight Maintenance: Understanding of the daily nutrition guidelines, which  includes 25-35% calories from fat, 7% or less cal from saturated fats, less than 258m cholesterol, less than 1.5gm of sodium, & 5 or more servings of fruits and vegetables daily;Weight Loss: Understanding of general recommendations for a balanced deficit meal plan, which promotes 1-2 lb weight loss per week and includes a negative energy balance of [phone] kcal/d;Understanding recommendations for meals to include 15-35% energy as protein, 25-35% energy from fat, 35-60% energy from carbohydrates, less than 2055mof dietary cholesterol, 20-35 gm of total fiber daily;Weight Gain: Understanding of general recommendations for a high calorie, high  protein meal plan that promotes weight gain by distributing calorie intake throughout the day with the consumption for 4-5 meals, snacks, and/or supplements   Sedentary Yes   Intervention Provide advice, education, support and counseling about physical activity/exercise needs.;Develop an individualized exercise prescription for aerobic and resistive training based on initial evaluation findings, risk stratification, comorbidities and participant's personal goals.   Expected Outcomes Achievement of increased cardiorespiratory fitness and enhanced flexibility, muscular endurance and strength shown through measurements of functional capacity and personal statement of participant.   Intervention Provide advice, education, support and counseling about physical activity/exercise needs.;Develop an individualized exercise prescription for aerobic and resistive training based on initial evaluation findings, risk stratification, comorbidities and participant's personal goals.   Expected Outcomes Achievement of increased cardiorespiratory fitness and enhanced flexibility, muscular endurance and strength shown through measurements of functional capacity and personal statement of participant.   Stress Yes   Intervention Offer individual and/or small group education and counseling on  adjustment to heart disease, stress management and health-related lifestyle change. Teach and support self-help strategies.;Refer participants experiencing significant psychosocial distress to appropriate mental health specialists for further evaluation and treatment. When possible, include family members and significant others in education/counseling sessions.   Expected Outcomes Short Term: Participant demonstrates changes in health-related behavior, relaxation and other stress management skills, ability to obtain effective social support, and compliance with psychotropic medications if prescribed.;Long Term: Emotional wellbeing is indicated by absence of clinically significant psychosocial distress or social isolation.      Core Components/Risk Factors/Patient Goals Review:      Goals and Risk Factor Review    Row Name 12/19/15 1335 12/28/15 1330 01/07/16 1504 01/22/16 0735 01/29/16 0751     Core Components/Risk Factors/Patient Goals Review   Personal Goals Review Improve shortness of breath with ADL's;Develop more efficient breathing techniques such as purse lipped breathing and diaphragmatic breathing and practicing self-pacing with activity. Sedentary;Increase Strength and Stamina Increase knowledge of respiratory medications and ability to use respiratory devices properly.;Improve shortness of breath with ADL's;Develop more efficient breathing techniques such as purse lipped breathing and diaphragmatic breathing and practicing self-pacing with activity. Sedentary;Increase Strength and Stamina;Weight Management/Obesity Develop more efficient breathing techniques such as purse lipped breathing and diaphragmatic breathing and practicing self-pacing with activity.;Improve shortness of breath with ADL's;Increase knowledge of respiratory medications and ability to use respiratory devices properly.;Increase Strength and Stamina;Sedentary   Review Reviewed pursed lip breathing technique with pt today to  use during exercise and at home. Erika Martinez is riding her bike at home on off days from rehab.  She starting to notice an increase in her strength and stamina. Erika Pecor has a good understanding of her MDI's. She does have problems with thrush and her Advair. Educated Erika Gharibian on her MDI's and SVN being equally important, and if good technique with a spacer is performed, an MDI's can be the same amount of medicine as a SVN. We discussed increasing her oxygen on the treadmill, so she can get her full 10 minutes accomplished with out low O2Sat's and increased shortness of breath. Even with PLB, the treadmill is a challenge. Erika Gianfrancesco received a letter from the transplant team. She is required to exercise 3 days/week in [facility] and also loss 7-10lbs. She plans to meet with the dietitian one on one for help with this weight loss. Erika Rodriges meets with the transplant team on 02/04/16 and 02/05/16. She has worked her exercise goal on the TM to 8mns. and 1.268m. [facility] list rquirement for transplant lists  1.5-2.0 on the TM, so that will be her next goal. She has a good understaning of her MDI's and her shortness of breath - uses pacing , PLB, and adjusts her oxygen indepently for increased exercise.   Expected Outcomes Pt will become independent in using pursed lip breathing to improve overall shortness of breath. Erika Martinez will continue to attend exercise and educaiton classes to boost her functional capacity.  -  - Work towards increasing her spend on the TM to 1.68mh.   [place] 03/05/16 1309 03/18/16 0926           Core Components/Risk Factors/Patient Goals Review   Personal Goals Review Increase knowledge of respiratory medications and ability to use respiratory devices properly.;Improve shortness of breath with ADL's;Develop more efficient breathing techniques such as purse lipped breathing and diaphragmatic breathing and practicing self-pacing with activity.;Increase Strength and Stamina;Weight  Management/Obesity Weight Management/Obesity;Sedentary;Increase Strength and Stamina;Improve shortness of breath with ADL's;Increase knowledge of respiratory medications and ability to use respiratory devices properly.;Develop more efficient breathing techniques such as purse lipped breathing and diaphragmatic breathing and practicing self-pacing with activity.      Review DDonisehas been out for a few weeks, since she returned to work. She has been able to clear her schedule to attend Pulmonary Rehab 3 days a week as well as maintain her work. She continues to state understanding of properuse of her MDI.s  DJeanniacontinues to work toward required weight loss and is down 7 - 8 pounds. She continues to manage her medications and uses her purse lipped breathing technique as needed.  Erika BHannanis progressing toward her lung transplant. She weighed at 145.4lbs and her goal is under 140lbs. She has raised most of her funding and has her next meeting with the transplant team on December 6th. I gave her a spacer to use with her albuterol MDI. She has a good understanding of her MDI's and oxygen - self adjusting the flow with increased exercise to manage her shortness of breath and meet her time goals on the exercise equipment. PLB is a part of her active life and managing her pulmonary fibrosis.      Expected Outcomes Continue compliance with medications and use of MDI's. Continue to work toard reaching the weight loss, fund raising and exercise goals required to be placed on the transplant list to see her name on the list and ready to start "[employer].  -         Core Components/Risk Factors/Patient Goals at Discharge (Final Review):      Goals and Risk Factor Review - 03/18/16 0926      Core Components/Risk Factors/Patient Goals Review   Personal Goals Review Weight Management/Obesity;Sedentary;Increase Strength and Stamina;Improve shortness of breath with ADL's;Increase knowledge of respiratory medications  and ability to use respiratory devices properly.;Develop more efficient breathing techniques such as purse lipped breathing and diaphragmatic breathing and practicing self-pacing with activity.   Review Erika BCalixis progressing toward her lung transplant. She weighed at 145.4lbs and her goal is under 140lbs. She has raised most of her funding and has her next meeting with the transplant team on December 6th. I gave her a spacer to use with her albuterol MDI. She has a good understanding of her MDI's and oxygen - self adjusting the flow with increased exercise to manage her shortness of breath and meet her time goals on the exercise equipment. PLB is a part of her active life and managing her pulmonary fibrosis.  ITP Comments:     ITP Comments    Row Name 12/14/15 1215 12/14/15 1216 12/14/15 1221 12/14/15 1226 01/09/16 1321   ITP Comments taking xanax when she needs it. Vanilla reports it is stressful about the possibility of a lung transplant and all the workup.   Pulse ox down to 82% at end of 6 minute walk even on 8 liters of supplemental oxygen. Pulse ox came up to 92% with rest.  Right femerol area is sore Erika Martinez reports from her heart cath last week and her right groin area and leg are bruised. Erika Martinez reports her right shoulder hurts from the position she had to be for her ultrasound of her liver at [facility] (being evaluated for a lung transplant).  Erika Martinez wants to meet with the Registered Dietician to help her lose weight. Erika Martinez has cut out bread and sodas. Increased Zasha from 6 to 8 liters of oxygen while on XR.  Increased [place] to 15 liters oxygen using her own nasal cannula with a reservoir while Linsie was on the treadmill.  Shelisha was able to walk at a speed of 1.2 mph with 0.5% incline on the treadmill for 10 minutes without stopping.  This is the first time Shritha has been able to do this.     [place] Name 02/06/16 1606 03/06/16 1121         ITP Comments Erika Boye had a PFT at [facility] with a DLCO of 17%. She  also had an ABG with PO2 of 42. 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 Lillien has not been able to attend regularly as she is back at work.         Comments: 30 Day Review

## 2016-04-09 ENCOUNTER — Encounter: Payer: 59 | Attending: Pulmonary Disease

## 2016-04-09 DIAGNOSIS — J8410 Pulmonary fibrosis, unspecified: Secondary | ICD-10-CM | POA: Insufficient documentation

## 2016-04-11 ENCOUNTER — Ambulatory Visit (INDEPENDENT_AMBULATORY_CARE_PROVIDER_SITE_OTHER): Payer: 59 | Admitting: Family Medicine

## 2016-04-11 ENCOUNTER — Encounter: Payer: Self-pay | Admitting: Family Medicine

## 2016-04-11 VITALS — BP 132/82 | HR 96 | Temp 99.2°F | Resp 16 | Ht 61.0 in | Wt 142.1 lb

## 2016-04-11 DIAGNOSIS — D849 Immunodeficiency, unspecified: Secondary | ICD-10-CM

## 2016-04-11 DIAGNOSIS — J8410 Pulmonary fibrosis, unspecified: Secondary | ICD-10-CM

## 2016-04-11 DIAGNOSIS — J069 Acute upper respiratory infection, unspecified: Secondary | ICD-10-CM

## 2016-04-11 DIAGNOSIS — B9789 Other viral agents as the cause of diseases classified elsewhere: Secondary | ICD-10-CM

## 2016-04-11 DIAGNOSIS — J9611 Chronic respiratory failure with hypoxia: Secondary | ICD-10-CM | POA: Diagnosis not present

## 2016-04-11 DIAGNOSIS — D899 Disorder involving the immune mechanism, unspecified: Secondary | ICD-10-CM | POA: Diagnosis not present

## 2016-04-11 DIAGNOSIS — R05 Cough: Secondary | ICD-10-CM

## 2016-04-11 DIAGNOSIS — R059 Cough, unspecified: Secondary | ICD-10-CM

## 2016-04-11 NOTE — Progress Notes (Signed)
Name: Erika Martinez   MRN: [id number]    DOB: Mar 29, 1956   Date:04/11/2016       Progress Note  Subjective  Chief Complaint  Chief Complaint  Patient presents with  . Sinusitis    pt stated that her symptoms have not gotten any better since last visit and she has taken all of her z-pak. pt concerned of pneum  . Cough    productive    HPI  URI/pulmonary fibrosis/oxygen dependent/immunosuppression: she was seen in our office about 2 weeks ago and was given Z-pack however she continues to have a productive cough but is now clear instead of green, facial pressure, sneezing and post-nasal drainage. She states today she is feeling worse, more tired than usual, low grade fever.  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 She has noticed a change in appetite because food is tasting bland, she also has some sore throat.    Patient Active Problem List   Diagnosis Date Noted  . Raynaud disease 11/26/2015  . Awaiting organ transplant status 08/29/2015  . Decreased GFR 07/24/2015  . Mild major depression ([place]) 06/07/2015  . Hypertension, benign 04/06/2015  . History of hepatitis C 02/02/2015  . Insomnia 02/02/2015  . Gastro-esophageal reflux disease without esophagitis 02/02/2015  . IBS (irritable bowel syndrome) 02/02/2015  . Low serum cobalamin 02/02/2015  . Dependence on supplemental oxygen 02/02/2015  . Perennial allergic rhinitis with seasonal variation 02/02/2015  . Scleroderma ([place]) 02/02/2015  . SPL (spondylolisthesis) 02/02/2015  . Supraventricular tachycardia ([place]) 02/02/2015  . Vitamin D deficiency 02/02/2015  . Anxiety 02/02/2015  . History of pneumococcal pneumonia 09/13/2013  . Chronic hypoxemic respiratory failure ([place]) 09/13/2013  . Polycythemia, secondary 09/13/2013  . 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 ABDOMINAL HYSTERECTOMY    .  VAGINAL HYSTERECTOMY      Family History  Problem Relation Age of Onset  . Lung cancer Father   . Cancer Father   . Alzheimer's disease Mother   . Breast cancer Neg Hx     Social History   Social History  . Marital status: Divorced    Spouse name: N/A  . Number of children: N/A  . Years of education: N/A   Occupational History  . Not on file.   Social History Main Topics  . Smoking status: Never Smoker  . Smokeless tobacco: Never Used  . Alcohol use 2.0 oz/week    4 Standard drinks or equivalent per week     Comment: "social drinker"  . Drug use: No  . Sexual activity: Not on file   Other Topics Concern  . Not on file   Social History Narrative  . No narrative on file     Current Outpatient Prescriptions:  .  ADVAIR DISKUS 500-50 MCG/DOSE AEPB, INHALE 1 PUFF INTO THE LUNGS 2 (TWO) TIMES DAILY., Disp: 60 each, Rfl: 5 .  albuterol (PROVENTIL) (2.5 MG/3ML) 0.083% nebulizer solution, Take 3 mLs (2.5 mg total) by nebulization every 4 (four) hours as needed for wheezing or shortness of breath. DX Code: J84.10, Disp: 75 mL, Rfl: 12 .  azaTHIOprine (IMURAN) 50 MG tablet, Take 2 tablets (100 mg total) by mouth daily. Daily, Disp: 60 tablet, Rfl: 5 .  azithromycin (ZITHROMAX Z-PAK) 250 MG tablet, Take as needed, Disp: 6 each, Rfl: 0 .  cholecalciferol (VITAMIN D) 1000 UNITS tablet, Take 2,000 Units  by mouth daily., Disp: , Rfl:  .  diltiazem (CARDIZEM) 60 MG tablet, TAKE 1 TABLET (60 MG TOTAL) BY MOUTH DAILY., Disp: 30 tablet, Rfl: 2 .  fluconazole (DIFLUCAN) 150 MG tablet, TAKE 1 TABLET WEEKLY AS NEEDED FOR THRUSH, Disp: 4 tablet, Rfl: 2 .  fluticasone (FLONASE) 50 MCG/ACT nasal spray, Place 2 sprays into both nostrils daily., Disp: 16 g, Rfl: 6 .  loratadine (CLARITIN) 10 MG tablet, TAKE 1 TABLET (10 MG TOTAL) BY MOUTH 2 (TWO) TIMES DAILY., Disp: 60 tablet, Rfl: 3 .  nystatin (MYCOSTATIN) 100000 UNIT/ML suspension, TAKE 1 TABLESPOON BY MOUTH BEFORE MEALS AND AT BEDTIME (SWISH  AND SPIT OUT), Disp: 473 mL, Rfl: 2 .  omeprazole (PRILOSEC) 40 MG capsule, TAKE 1 CAPSULE TWICE DAILY, Disp: 60 capsule, Rfl: 2 .  PEG 3350-KCl-NaBcb-NaCl-NaSulf (PEG-3350/ELECTROLYTES) 236 g SOLR, Take by mouth., Disp: , Rfl:  .  predniSONE (DELTASONE) 10 MG tablet, Take 0.5 tablets (5 mg total) by mouth daily with breakfast., Disp: 30 tablet, Rfl: 0 .  sulfamethoxazole-trimethoprim (BACTRIM DS,SEPTRA DS) 800-160 MG tablet, TAKE 1 TABLET BY MOUTH 3 (THREE) TIMES A WEEK., Disp: , Rfl: 5 .  triamcinolone cream (KENALOG) 0.1 %, Apply 1 application topically 2 (two) times daily., Disp: 30 g, Rfl: 0 .  VENTOLIN HFA 108 (90 Base) MCG/ACT inhaler, INHALE 2 PUFFS INTO THE LUNGS EVERY 4 (FOUR) HOURS AS NEEDED FOR WHEEZING OR SHORTNESS OF BREATH., Disp: 18 g, Rfl: 5 .  vitamin B-12 (CYANOCOBALAMIN) 250 MCG tablet, Place 250 mcg under the tongue every other day. , Disp: , Rfl:   No Known Allergies   ROS  Ten systems reviewed and is negative except as mentioned in HPI   Objective  Vitals:   04/11/16 1526  BP: 132/82  Pulse: 96  Resp: 16  Temp: 99.2 F (37.3 C)  TempSrc: Oral  SpO2: 99%  Weight: 142 lb 2 oz (64.5 kg)  Height: 5\' 1"  (1.549 m)    Body mass index is 26.85 kg/m.  Physical Exam   Constitutional: Patient appears well-developed  Obese  HEENT: head atraumatic, normocephalic, pupils equal and reactive to light, neck supple, throat within normal limits, boggy turbinates, no tenderness during palpation of sinus  Cardiovascular: Normal rate, regular rhythm and normal heart sounds. No murmur heard.1 plus BLE edema. Pulmonary/Chest: Effort normal very fine end inspiratory crackles stable on both lung fields, lower third  Abdominal: Soft. There is no tenderness. Psychiatric: Patient has a normal mood and affect. behavior is normal. Judgment and thought content normal. Skin violaceous nail beds  Recent Results (from the past 2160 hour(s))  HM COLONOSCOPY     Status: None    Collection Time: 02/28/16 12:00 AM  Result Value Ref Range   HM Colonoscopy See Report (in chart) See Report (in chart), Patient Reported    Comment: Perianal & rectal examinations were nl, multiple diverticula      PHQ2/9: Depression screen [facility] 2/9 03/26/2016 12/14/2015 10/03/2015 07/03/2015 04/06/2015  Decreased Interest 0 1 0 0 0  Down, Depressed, Hopeless 0 1 0 0 0  PHQ - 2 Score 0 2 0 0 0  Altered sleeping - 1 - - -  Tired, decreased energy - 2 - - -  Change in appetite - 2 - - -  Feeling bad or failure about yourself  - 1 - - -  Trouble concentrating - 0 - - -  Moving slowly or fidgety/restless - 0 - - -  Suicidal thoughts - 0 - - -  PHQ-9 Score - 8 - - -  Difficult doing work/chores - Somewhat difficult - - -     Fall Risk: Fall Risk  03/26/2016 12/14/2015 10/03/2015 07/03/2015 1Jan 06, 202016  Falls in the past year? No No No No No     Assessment & Plan  1. Viral upper respiratory tract infection  Likely viral illness, but she has multiple risk factors, we will check CXR , go to [facility] if temperature rises or cough gets more productive over the weekend  2. Pulmonary fibrosis ([place])   3. Cough  -CXR

## 2016-04-21 ENCOUNTER — Telehealth: Payer: Self-pay | Admitting: Pulmonary Disease

## 2016-04-21 ENCOUNTER — Other Ambulatory Visit: Payer: Self-pay | Admitting: Pulmonary Disease

## 2016-04-21 ENCOUNTER — Other Ambulatory Visit: Payer: Self-pay | Admitting: Family Medicine

## 2016-04-21 MED ORDER — LEVOFLOXACIN 500 MG PO TABS: 500.0000 mg | ORAL_TABLET | Freq: Every day | ORAL | 0 refills | Status: DC

## 2016-04-21 NOTE — Telephone Encounter (Signed)
Spoke with pt who states she has been having some sinus issues for 4 weeks. Pt seen PCP for sinus infection and was prescribed zpack with no improvement. Pt states she spit up some bloody mucus this afternoon. Pt c/o postnasal drip, occ prod cough with clear mucus, nasal congestion and wheezing. Pt denies any fever, sweats or chills.   BQ please advise. Thanks.

## 2016-04-21 NOTE — Telephone Encounter (Signed)
levaquin 500mg  daily x5 days If no improvement needs OV

## 2016-04-21 NOTE — Telephone Encounter (Signed)
Called and spoke to pt. Informed her of the recs per BQ. Rx sent to preferred pharmacy. Pt verbalized understanding and denied any further questions or concerns at this time.

## 2016-04-29 ENCOUNTER — Encounter: Payer: Self-pay | Admitting: *Deleted

## 2016-04-29 DIAGNOSIS — J8410 Pulmonary fibrosis, unspecified: Secondary | ICD-10-CM

## 2016-04-30 ENCOUNTER — Encounter: Payer: Self-pay | Admitting: Respiratory Therapy

## 2016-04-30 DIAGNOSIS — J8410 Pulmonary fibrosis, unspecified: Secondary | ICD-10-CM

## 2016-05-05 ENCOUNTER — Encounter: Payer: Self-pay | Admitting: *Deleted

## 2016-05-05 DIAGNOSIS — J8410 Pulmonary fibrosis, unspecified: Secondary | ICD-10-CM

## 2016-05-05 NOTE — Progress Notes (Signed)
Pulmonary Individual Treatment Plan  Patient Details  Name: AUBRIAUNA RINER MRN: [id number] Date of Birth: August 26, 1955 Referring Provider:   April Manson Pulmonary Rehab from 12/14/2015 in [facility] Cardiac and Pulmonary Rehab  Referring Provider  McQuaid      Initial Encounter Date:  Flowsheet Row Pulmonary Rehab from 12/14/2015 in [facility] Cardiac and Pulmonary Rehab  Date  12/14/15  Referring Provider  McQuaid      Visit Diagnosis: Pulmonary fibrosis ([place])  Patient's Home Medications on Admission:  Current Outpatient Prescriptions:  .  ADVAIR DISKUS 500-50 MCG/DOSE AEPB, INHALE 1 PUFF INTO THE LUNGS 2 (TWO) TIMES DAILY., Disp: 60 each, Rfl: 5 .  albuterol (PROVENTIL) (2.5 MG/3ML) 0.083% nebulizer solution, Take 3 mLs (2.5 mg total) by nebulization every 4 (four) hours as needed for wheezing or shortness of breath. DX Code: J84.10, Disp: 75 mL, Rfl: 12 .  azaTHIOprine (IMURAN) 50 MG tablet, Take 2 tablets (100 mg total) by mouth daily. Daily, Disp: 60 tablet, Rfl: 5 .  azaTHIOprine (IMURAN) 50 MG tablet, TAKE 2 TABLETS BY MOUTH EVERY DAY, Disp: 60 tablet, Rfl: 5 .  azithromycin (ZITHROMAX Z-PAK) 250 MG tablet, Take as needed, Disp: 6 each, Rfl: 0 .  cholecalciferol (VITAMIN D) 1000 UNITS tablet, Take 2,000 Units by mouth daily., Disp: , Rfl:  .  diltiazem (CARDIZEM) 60 MG tablet, TAKE 1 TABLET (60 MG TOTAL) BY MOUTH DAILY., Disp: 30 tablet, Rfl: 2 .  fluconazole (DIFLUCAN) 150 MG tablet, TAKE 1 TABLET WEEKLY AS NEEDED FOR THRUSH, Disp: 4 tablet, Rfl: 2 .  fluticasone (FLONASE) 50 MCG/ACT nasal spray, Place 2 sprays into both nostrils daily., Disp: 16 g, Rfl: 6 .  levofloxacin (LEVAQUIN) 500 MG tablet, Take 1 tablet (500 mg total) by mouth daily., Disp: 5 tablet, Rfl: 0 .  loratadine (CLARITIN) 10 MG tablet, TAKE 1 TABLET (10 MG TOTAL) BY MOUTH 2 (TWO) TIMES DAILY., Disp: 60 tablet, Rfl: 3 .  nystatin (MYCOSTATIN) 100000 UNIT/ML suspension, TAKE 1 TABLESPOON BY MOUTH BEFORE MEALS AND AT BEDTIME  (SWISH AND SPIT OUT), Disp: 473 mL, Rfl: 2 .  omeprazole (PRILOSEC) 40 MG capsule, TAKE 1 CAPSULE TWICE DAILY, Disp: 60 capsule, Rfl: 2 .  PEG 3350-KCl-NaBcb-NaCl-NaSulf (PEG-3350/ELECTROLYTES) 236 g SOLR, Take by mouth., Disp: , Rfl:  .  predniSONE (DELTASONE) 10 MG tablet, Take 0.5 tablets (5 mg total) by mouth daily with breakfast., Disp: 30 tablet, Rfl: 0 .  sulfamethoxazole-trimethoprim (BACTRIM DS,SEPTRA DS) 800-160 MG tablet, TAKE 1 TABLET BY MOUTH 3 (THREE) TIMES A WEEK., Disp: , Rfl: 5 .  triamcinolone cream (KENALOG) 0.1 %, Apply 1 application topically 2 (two) times daily., Disp: 30 g, Rfl: 0 .  VENTOLIN HFA 108 (90 Base) MCG/ACT inhaler, INHALE 2 PUFFS INTO THE LUNGS EVERY 4 (FOUR) HOURS AS NEEDED FOR WHEEZING OR SHORTNESS OF BREATH., Disp: 18 g, Rfl: 5 .  vitamin B-12 (CYANOCOBALAMIN) 250 MCG tablet, Place 250 mcg under the tongue every other day. , Disp: , Rfl:   Past Medical History: Past Medical History:  Diagnosis Date  . Abnormal hemoglobin (HCC)   . Allergy   . Anxiety   . Asthma   . Controlled insomnia   . Erythrocytosis   . GERD (gastroesophageal reflux disease)   . Hep C w/o coma, chronic ([place])   . Hypertension   . IBS (irritable bowel syndrome)   . Lumbago   . Mitral valve prolapse   . Oxygen dependent   . Plantar wart   . Pulmonary fibrosis ([place])   .  Scleroderma ([place])    Dr. Pennie Banter  . Seasonal allergies   . Spondylolisthesis   . Symptomatic menopausal or female climacteric states     Tobacco Use: History  Smoking Status  . Never Smoker  Smokeless Tobacco  . Never Used    Labs: Recent Review Flowsheet Data    Labs for ITP Cardiac and Pulmonary Rehab Latest Ref Rng & Units 07/14/2013 07/05/2015 08/02/2015   Cholestrol 100 - 199 mg/dL 169 - 196   LDLCALC 0 - 99 mg/dL 95 - 112(H)   HDL >39 mg/dL 58 - 68   Trlycerides 0 - 149 mg/dL 78 - 78   Hemoglobin A1c 4.8 - 5.6 % - 6.3(H) -       ADL UCSD:     Pulmonary Assessment Scores    Row Name  12/14/15 1350         ADL UCSD   ADL Phase Entry     SOB Score total 43     Rest 0     Walk 2     Stairs 4     Bath 1     Dress 1     Shop 2        Pulmonary Function Assessment:   Exercise Target Goals:    Exercise Program Goal: Individual exercise prescription set with THRR, safety & activity barriers. Participant demonstrates ability to understand and report RPE using BORG scale, to self-measure pulse accurately, and to acknowledge the importance of the exercise prescription.  Exercise Prescription Goal: Starting with aerobic activity 30 plus minutes a day, 3 days per week for initial exercise prescription. Provide home exercise prescription and guidelines that participant acknowledges understanding prior to discharge.  Activity Barriers & Risk Stratification:     Activity Barriers & Cardiac Risk Stratification - 12/14/15 1204      Activity Barriers & Cardiac Risk Stratification   Activity Barriers Deconditioning;Joint Problems      6 Minute Walk:     6 Minute Walk    Row Name 12/14/15 1204 12/14/15 1216 12/14/15 1301     6 Minute Walk   Phase Initial  -  -   Distance 810 feet  -  -   Walk Time 5.81 minutes  -  -   # of Rest Breaks 1  -  -   RPE 15  -  -   Perceived Dyspnea  3  "tired " on 8 liters oxgyen for 6 minute walk -   Pulse ox down to 82% at end of 6 minute walk even on 8 liters of supplemental oxygen. Pulse ox came up to 92% with rest.  3   Resting HR 80 bpm  -  -   Resting BP 122/70  -  -   Max Ex. HR 118 bpm  -  -   Max Ex. BP 138/70  -  -   Row Name 12/14/15 1302 12/14/15 1312       6 Minute Walk   Phase Initial Initial       Initial Exercise Prescription:     Initial Exercise Prescription - 12/14/15 1400      Date of Initial Exercise RX and Referring Provider   Date 12/14/15   Referring Provider McQuaid     Oxygen   Oxygen Continuous   Liters 4     Treadmill   MPH 1.4   Grade 1   Minutes 15   METs 2.26     NuStep    Level  2   Minutes 15   METs 2.3     REL-XR   Level 1   Minutes 15   METs 2.3     Prescription Details   Frequency (times per week) 3   Duration Progress to 30 minutes of continuous aerobic without signs/symptoms of physical distress     Intensity   THRR 40-80% of Max Heartrate 111-142   Ratings of Perceived Exertion 11-13   Perceived Dyspnea 0-4     Progression   Progression Continue to progress workloads to maintain intensity without signs/symptoms of physical distress.     Resistance Training   Training Prescription Yes   Weight 2   Reps 10-15      Perform Capillary Blood Glucose checks as needed.  Exercise Prescription Changes:     Exercise Prescription Changes    Row Name 12/26/15 1400 01/09/16 1300 01/23/16 1400 02/06/16 1300 02/21/16 1100     Exercise Review   Progression Yes Yes Yes Yes Yes     Response to Exercise   Blood Pressure (Admit) 110/60 118/64 112/66 120/66 148/78   Blood Pressure (Exercise) 126/64 138/64 150/84 124/74 112/66   Blood Pressure (Exit) 108/50 140/70 124/62 104/62 106/74   Heart Rate (Admit) 106 bpm 114 bpm 110 bpm 94 bpm 100 bpm   Heart Rate (Exercise) 115 bpm 120 bpm 118 bpm 112 bpm 106 bpm   Heart Rate (Exit) 104 bpm 100 bpm 108 bpm 100 bpm 95 bpm   Oxygen Saturation (Admit) 96 % 89 % 97 % 96 % 100 %   Oxygen Saturation (Exercise) 92 % 90 % 93 % 92 % 89 %   Oxygen Saturation (Exit) 94 % 96 % 100 % 95 % 100 %   Rating of Perceived Exertion (Exercise) '13 13 13 13 13   ' Perceived Dyspnea (Exercise) '3 3 3 3 3   ' Comments  - Home Exercise Guidelines given 01/09/16  -  -  -     Progression   Progression Continue to progress workloads to maintain intensity without signs/symptoms of physical distress. Continue to progress workloads to maintain intensity without signs/symptoms of physical distress. Continue to progress workloads to maintain intensity without signs/symptoms of physical distress. Continue to progress workloads to maintain  intensity without signs/symptoms of physical distress. Continue to progress workloads to maintain intensity without signs/symptoms of physical distress.     Resistance Training   Training Prescription Yes Yes Yes Yes Yes   Weight 2 6  exercise O2 6-8L '3 3 3   ' Reps 10-15 10-15 10-15 10-15 10-12     Interval Training   Interval Training  -  - No  - No     Oxygen   Oxygen Continuous  - Continuous  - Continuous   Liters '4 2 8 8  15 ' L/min on TM 8     Treadmill   MPH 1.2 1.2 1.2 1.2 1.3   Grade 0 0.5 0 0 0   Minutes '10 10 12 25 20   ' METs  - 2 1.92 1.92 2     NuStep   Level  - 2  -  -  -   Minutes  - 15  -  -  -   METs  - 1.5  -  -  -     REL-XR   Level '3 3 3 4 4   ' Minutes '15 15 15 15 15   ' METs 1.9 2.1  - 3 2.6     Biostep-RELP  Level  - 2  -  -  -   Minutes  - 15  -  -  -   METs  - 2  -  -  -     Home Exercise Plan   Plans to continue exercise at  - Home  Stationary bike  -  -  -   Frequency  - Add 4 additional days to program exercise sessions.  -  -  -   Row Name 03/06/16 1100 03/18/16 1400 04/16/16 1600         Exercise Review   Progression Yes Yes  -       Response to Exercise   Blood Pressure (Admit)  - 124/68  -     Blood Pressure (Exercise) 160/80 152/70 148/72     Blood Pressure (Exit) 130/80 128/72 126/54     Heart Rate (Admit) 105 bpm 102 bpm 104 bpm     Heart Rate (Exercise) 106 bpm 115 bpm 102 bpm     Heart Rate (Exit) 98 bpm 99 bpm 89 bpm     Oxygen Saturation (Admit) 100 % 93 % 95 %     Oxygen Saturation (Exercise) 97 % 86 % 94 %     Oxygen Saturation (Exit) 99 % 100 % 98 %     Rating of Perceived Exertion (Exercise) '13 13 12     ' Perceived Dyspnea (Exercise) '3 3 2     ' Symptoms  -  - none     Comments  - Home Exercise Guidelines given 01/09/16 Home Exercise Guidelines given 01/09/16     Duration  - Progress to 45 minutes of aerobic exercise without signs/symptoms of physical distress Progress to 45 minutes of aerobic exercise without signs/symptoms of  physical distress     Intensity  - THRR unchanged  111-142 THRR unchanged       Progression   Progression Continue to progress workloads to maintain intensity without signs/symptoms of physical distress. Continue to progress workloads to maintain intensity without signs/symptoms of physical distress. Continue to progress workloads to maintain intensity without signs/symptoms of physical distress.     Average METs  - 2.45 2.13       Resistance Training   Training Prescription Yes Yes Yes     Weight 3 3 right 2 left 3 right 2 left     Reps 10-12 10-12 10-12       Interval Training   Interval Training No No No       Oxygen   Oxygen Continuous Continuous Continuous     Liters '8 8 8  ' 15L on treadmill       Treadmill   MPH 1.2 1.3 1.3     Grade 0 0 0     Minutes 20 20 -  omitted at last visit     METs 1.92 2 2       NuStep   Level  -  - 4     Minutes  -  - 15     METs  -  - 1.5       REL-XR   Level '5 4 3     ' Minutes '15 15 15     ' METs  - 2.9 2.9       Home Exercise Plan   Plans to continue exercise at  - [place] bike     Frequency  - Add 4 additional days to program exercise sessions. Add 4  additional days to program exercise sessions.        Exercise Comments:     Exercise Comments    Row Name 12/14/15 1217 12/19/15 1337 01/09/16 1312 01/09/16 1356 01/23/16 1423   Exercise Comments  Pulse ox down to 82% at end of 6 minute walk even on 8 liters of supplemental oxygen. Pulse ox came up to 92% with rest.  First full day of exercise!  Patient was oriented to gym and equipment including functions, settings, policies, and procedures.  Patient's individual exercise prescription and treatment plan were reviewed.  All starting workloads were established based on the results of the 6 minute walk test done at initial orientation visit.  The plan for exercise progression was also introduced and progression will be customized based on patient's  performance and goals. Reviewed home exercise with pt today.  Pt plans to continue riding stationary bike at home  for exercise.  We discussed her progression with exercise at home to increase her current time. Reviewed THR, pulse, RPE, sign and symptoms, and when to call 911 or MD.  Also discussed weather considerations and indoor options.  Pt voiced understanding. Maraya is continuing to improve her intensity and duration of exercise. Tyjanae continues to progress well with exercise.   [place] Name 02/06/16 1329 02/06/16 1605 02/21/16 1152 03/06/16 1119 03/18/16 1452   Exercise Comments Niobe is progressing well with exercise.  She has added resistnace on the XR and more time to TM. Ms Dorow had an oxygen titration walk at [facility] for her Lung Transplant surgery, so we will not perform a Mid70md DEndahas progressed well with exercise. DMarianelahas not been able to attend regularly as she is back at work. DBrentneyis trying to find a routine with her work schedule to allow her to get to rehab.  She continues to make improvements when she is here.  We will continue to monitor her progression.   RCarpendaleName 04/02/16 1551 04/16/16 1614 04/29/16 1550       Exercise Comments DLakotahas been out with work since last review. DMargarettahas had one visit since last review.  We will continue to monitor her progression. DSirenityhas not been in since last review.  No progression made.        Discharge Exercise Prescription (Final Exercise Prescription Changes):     Exercise Prescription Changes - 04/16/16 1600      Response to Exercise   Blood Pressure (Exercise) 148/72   Blood Pressure (Exit) 126/54   Heart Rate (Admit) 104 bpm   Heart Rate (Exercise) 102 bpm   Heart Rate (Exit) 89 bpm   Oxygen Saturation (Admit) 95 %   Oxygen Saturation (Exercise) 94 %   Oxygen Saturation (Exit) 98 %   Rating of Perceived Exertion (Exercise) 12   Perceived Dyspnea (Exercise) 2   Symptoms none   Comments Home Exercise Guidelines given  01/09/16   Duration Progress to 45 minutes of aerobic exercise without signs/symptoms of physical distress   Intensity THRR unchanged     Progression   Progression Continue to progress workloads to maintain intensity without signs/symptoms of physical distress.   Average METs 2.13     Resistance Training   Training Prescription Yes   Weight 3 right 2 left   Reps 10-12     Interval Training   Interval Training No     Oxygen   Oxygen Continuous   Liters 8  15L on treadmill     Treadmill  MPH 1.3   Grade 0   Minutes --  omitted at last visit   METs 2     NuStep   Level 4   Minutes 15   METs 1.5     REL-XR   Level 3   Minutes 15   METs 2.9     Home Exercise Plan   Plans to continue exercise at Home  Stationary bike   Frequency Add 4 additional days to program exercise sessions.       Nutrition:  Target Goals: Understanding of nutrition guidelines, daily intake of sodium <1574m, cholesterol <2013m calories 30% from fat and 7% or less from saturated fats, daily to have 5 or more servings of fruits and vegetables.  Biometrics:     Pre Biometrics - 12/14/15 1321      Pre Biometrics   Height 5' 1.2" (1.554 m)   Weight 151 lb 11.2 oz (68.8 kg)   Waist Circumference 42 inches   Hip Circumference 39.8 inches   Waist to Hip Ratio 1.06 %   BMI (Calculated) 28.5   Single Leg Stand 30 seconds       Nutrition Therapy Plan and Nutrition Goals:     Nutrition Therapy & Goals - 12/14/15 1226      Nutrition Therapy   Diet --  Deb wants to meet with the Registered Dietician to help her lose weight. Deb has cut out bread and sodas.       Nutrition Discharge: Rate Your Plate Scores:   Psychosocial: Target Goals: Acknowledge presence or absence of depression, maximize coping skills, provide positive support system. Participant is able to verbalize types and ability to use techniques and skills needed for reducing stress and depression.  Initial Review &  Psychosocial Screening:     Initial Psych Review & Screening - 12/14/15 13SelmaYes      Quality of Life Scores:     Quality of Life - 12/14/15 1323      Quality of Life Scores   Health/Function Pre 20.69 %   Socioeconomic Pre 20.38 %   Psych/Spiritual Pre 21 %   Family Pre 20 %   GLOBAL Pre 20.58 %      PHQ-9: Recent Review Flowsheet Data    [facility] 03/26/2016 12/14/2015 10/03/2015 07/03/2015 04/06/2015   Decreased Interest 0 1 0 0 0   Down, Depressed, Hopeless 0 1 0 0 0   PHQ - 2 Score 0 2 0 0 0   Altered sleeping - 1 - - -   Tired, decreased energy - 2 - - -   Change in appetite - 2 - - -   Feeling bad or failure about yourself  - 1 - - -   Trouble concentrating - 0 - - -   Moving slowly or fidgety/restless - 0 - - -   Suicidal thoughts - 0 - - -   PHQ-9 Score - 8 - - -   Difficult doing work/chores - Somewhat difficult - - -      Psychosocial Evaluation and Intervention:     Psychosocial Evaluation - 01/07/16 1231      Psychosocial Evaluation & Interventions   Comments Counselor met with Ms. BaVateroday as she has reutrned to this program.   She is [age]ears old and reports she is in the lung transplant process to see if she will be chosen.  Ms. BaMicaleas Pulmonary  Fibrosis.  She has a strong support system with her sister and a daughter who live close by.  She is actively involved in her local church community.  Ms. Fake states she sleeps "okay" with some nights needing to take a prescribed medication to help with her anxiety.  She states her appetite is good most of the time and she denies a history of depression, but some anxiety which is being treated.  Ms. 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  Ms. Kretz goals for this program are to increase her stamina and strength.   Counselor will continue to follow with her during the course of this program.         Psychosocial Re-Evaluation:     Psychosocial Re-Evaluation    Row Name 01/21/16 1655 03/05/16 1309 03/19/16 1027         Psychosocial Re-Evaluation   Comments Counselor follow up with Ms. Bobbye Charleston today.  She reports doing well and enjoying this program; including experiencing benefits of increased stamina.  She reports being approved for the lung transplant list and has had to go through multiple tests and meet extensive criteria for this to occur.  Ms. Cabezas reports this is stressful for her currently as well as school starting back in a few weeks.  Ms. Ballester reports sleeping "okay" currently and hopes that will continue, although she has to wean off her medications that have helped her sleep PRN.  Counselor will continue to follow with Ms. Ravelo throughout the course of this program.  Patient psychosocial assessment reveals no barriers to participation in Pulmonary Rehab. Psychosocial areas that are currently affecting patient's rehab experience include concerns about , financial resources,   And Work. Patient does continue to exhibit positive  coping skills to deal with her psychosocial concerns. Offered emotional support and reassurance. Patient does feel she is making progress toward Pulmonary Rehab goals. Patient reports her health and activity level has improved in the past 30 days as evidenced by patient's report of increased ability to manage her daily activities and return to work for an eight hour day..  Patient reports  feeling positive about current and projected progression in Pulmonary Rehab. After reviewing the patient's treatment plan, the patient is making progress toward Pulmonary Rehab goals. Patient's rate of progress toward rehab goals is good. Plan of action to help patient continue to work towards rehab goals include expecting her to be able to attend program 3 days a week even though she  has returned to work. Watching progress towards her ability to be placed on the transplant list. Kellis has raised almost 9 thousand dollars, has stopped taking the Xanax and is close to her weight loss goal required to be placed on the list.  Will continue to monitor and evaluate progress toward psychosocial goal(s). Counselor follow up with Ms. Howland on this date reporting dealing with a great deal of stress in regards to being on the transplant list.  Although she has raised a great deal of the funds required, there is still some to go; she also has had to stop taking her Xanax which has been helpful for dealing with stress and anxiety as well as sleep.  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  Ms. 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  She continues to have a positive outlook and reports her faith and exercise and leaning on friends help her cope with all that she is currently going through.  Counselor encouraged her to continue to practice her stress management skills and offered support and encouragement for all the progress made so far in this process.         Education: Education Goals: Education classes will be provided on a weekly basis, covering required topics. Participant will state understanding/return demonstration of topics presented.  Learning Barriers/Preferences:     Learning Barriers/Preferences - 12/14/15 1204      Learning Barriers/Preferences   Learning Barriers Sight  wears reading glasses      Education Topics: Initial Evaluation Education: - Verbal, written and demonstration of respiratory meds, RPE/PD scales, oximetry and breathing techniques. Instruction on use of nebulizers and MDIs: cleaning and proper use, rinsing mouth with steroid doses and importance of  monitoring MDI activations. Flowsheet Row Documentation from 03/14/2015 in [facility]  Date  12/05/14  Educator  LB  Instruction Review Code  2- meets goals/outcomes      General Nutrition Guidelines/Fats and Fiber: -Group instruction provided by verbal, written material, models and posters to present the general guidelines for heart healthy nutrition. Gives an explanation and review of dietary fats and fiber. Flowsheet Row Pulmonary Rehab from 02/01/2016 in [facility] Cardiac and Pulmonary Rehab  Date  12/31/15  Educator  CR  Instruction Review Code  2- meets goals/outcomes      Controlling Sodium/Reading Food Labels: -Group verbal and written material supporting the discussion of sodium use in heart healthy nutrition. Review and explanation with models, verbal and written materials for utilization of the food label. Flowsheet Row Pulmonary Rehab from 02/01/2016 in [facility] Cardiac and Pulmonary Rehab  Date  01/07/16  Educator  CR  Instruction Review Code  2- meets goals/outcomes      Exercise Physiology & Risk Factors: - Group verbal and written instruction with models to review the exercise physiology of the cardiovascular system and associated critical values. Details cardiovascular disease risk factors and the goals associated with each risk factor. Flowsheet Row Pulmonary Rehab from 02/01/2016 in [facility] Cardiac and Pulmonary Rehab  Date  12/26/15  Educator  Alberteen Sam and Nada Maclachlan   Instruction Review Code  2- meets goals/outcomes      Aerobic Exercise & Resistance Training: - Gives group verbal and written discussion on the health impact of inactivity. On the components of aerobic and resistive training programs and the benefits of this training and how to safely progress through these programs.   Flexibility, Balance, General Exercise Guidelines: - Provides group verbal and written instruction on the benefits of flexibility and balance  training programs. Provides general exercise guidelines with specific guidelines to those with heart or lung disease. Demonstration and skill practice provided.   Stress Management: - Provides group verbal and written instruction about the health risks of elevated stress, cause of high stress, and healthy ways to reduce stress. Flowsheet Row Pulmonary Rehab from 02/01/2016 in [facility] Cardiac and Pulmonary Rehab  Date  12/19/15  Educator  Kathreen Cornfield  Instruction Review Code  2- meets goals/outcomes      Depression: - Provides group verbal and written instruction on the correlation between heart/lung disease and depressed mood, treatment options, and the stigmas associated with seeking  treatment.   Exercise & Equipment Safety: - Individual verbal instruction and demonstration of equipment use and safety with use of the equipment. Flowsheet Row Pulmonary Rehab from 02/01/2016 in [facility] Cardiac and Pulmonary Rehab  Date  12/14/15  Educator  C. EnterkinRN  Instruction Review Code  1- partially meets, needs review/practice      Infection Prevention: - Provides verbal and written material to individual with discussion of infection control including proper hand washing and proper equipment cleaning during exercise session. Flowsheet Row Pulmonary Rehab from 02/01/2016 in [facility] Cardiac and Pulmonary Rehab  Date  12/14/15  Educator  C. EnterkinRN  Instruction Review Code  2- meets goals/outcomes      Falls Prevention: - Provides verbal and written material to individual with discussion of falls prevention and safety. Flowsheet Row Pulmonary Rehab from 02/01/2016 in [facility] Cardiac and Pulmonary Rehab  Date  12/14/15  Educator  C. Roselle  Instruction Review Code  2- meets goals/outcomes      Diabetes: - Individual verbal and written instruction to review signs/symptoms of diabetes, desired ranges of glucose level fasting, after meals and with exercise. Advice that pre and post exercise  glucose checks will be done for 3 sessions at entry of program.   Chronic Lung Diseases: - Group verbal and written instruction to review new updates, new respiratory medications, new advancements in procedures and treatments. Provide informative websites and "800" numbers of self-education. Flowsheet Row Pulmonary Rehab from 02/01/2016 in [facility] Cardiac and Pulmonary Rehab  Date  12/24/15  Educator  LB  Instruction Review Code  2- meets goals/outcomes      Lung Procedures: - Group verbal and written instruction to describe testing methods done to diagnose lung disease. Review the outcome of test results. Describe the treatment choices: Pulmonary Function Tests, ABGs and oximetry. Flowsheet Row Documentation from 03/14/2015 in [place]  Date  01/12/15  Educator  sj  Instruction Review Code  2- meets goals/outcomes      Energy Conservation: - Provide group verbal and written instruction for methods to conserve energy, plan and organize activities. Instruct on pacing techniques, use of adaptive equipment and posture/positioning to relieve shortness of breath. Flowsheet Row Documentation from 03/14/2015 in [place]  Date  01/24/15  Educator  SW  Instruction Review Code  2- meets goals/outcomes      Triggers: - Group verbal and written instruction to review types of environmental controls: home humidity, furnaces, filters, dust mite/pet prevention, HEPA vacuums. To discuss weather changes, air quality and the benefits of nasal washing. Flowsheet Row Documentation from 03/14/2015 in [place]  Date  12/18/14  Educator  LB  Instruction Review Code  2- meets goals/outcomes      Exacerbations: - Group verbal and written instruction to provide: warning signs, infection symptoms, calling MD promptly, preventive modes, and value of vaccinations. Review: effective airway  clearance, coughing and/or vibration techniques. Create an Sports administrator. Flowsheet Row Pulmonary Rehab from 02/01/2016 in [facility] Cardiac and Pulmonary Rehab  Date  01/21/16  Educator  LB  Instruction Review Code  2- meets goals/outcomes      Oxygen: - Individual and group verbal and written instruction on oxygen therapy. Includes supplement oxygen, available portable oxygen systems, continuous and intermittent flow rates, oxygen safety, concentrators, and Medicare reimbursement for oxygen. Flowsheet Row Documentation from 03/14/2015 in [place]  Date  12/05/14  Educator  LB  Instruction Review Code  2- meets goals/outcomes      Respiratory Medications: - Group verbal and written instruction to review medications for lung disease. Drug class, frequency, complications, importance of spacers, rinsing mouth after steroid MDI's, and proper cleaning methods for nebulizers. Flowsheet Row Documentation from 03/14/2015 in [place]  Date  12/22/14  Educator  Miner  Instruction Review Code  2- meets goals/outcomes [Spacer and instructions given to pt. ]      AED/CPR: - Group verbal and written instruction with the use of models to demonstrate the basic use of the AED with the basic ABC's of resuscitation. Flowsheet Row Pulmonary Rehab from 02/01/2016 in [facility] Cardiac and Pulmonary Rehab  Date  01/11/16  Educator  CE  Instruction Review Code  2- meets goals/outcomes      Breathing Retraining: - Provides individuals verbal and written instruction on purpose, frequency, and proper technique of diaphragmatic breathing and pursed-lipped breathing. Applies individual practice skills. Flowsheet Row Pulmonary Rehab from 02/01/2016 in [facility] Cardiac and Pulmonary Rehab  Date  12/19/15  Educator  [facility]  Instruction Review Code  2- meets goals/outcomes      Anatomy and Physiology of the Lungs: - Group verbal and written instruction  with the use of models to provide basic lung anatomy and physiology related to function, structure and complications of lung disease. Flowsheet Row Pulmonary Rehab from 02/01/2016 in [facility] Cardiac and Pulmonary Rehab  Date  02/01/16  Educator  [place]  Instruction Review Code  2- meets goals/outcomes      Heart Failure: - Group verbal and written instruction on the basics of heart failure: signs/symptoms, treatments, explanation of ejection fraction, enlarged heart and cardiomyopathy. Flowsheet Row Pulmonary Rehab from 02/01/2016 in [facility] Cardiac and Pulmonary Rehab  Date  01/25/16  Educator  CE  Instruction Review Code  2- meets goals/outcomes      Sleep Apnea: - Individual verbal and written instruction to review Obstructive Sleep Apnea. Review of risk factors, methods for diagnosing and types of masks and machines for OSA.   Anxiety: - Provides group, verbal and written instruction on the correlation between heart/lung disease and anxiety, treatment options, and management of anxiety. Flowsheet Row Documentation from 03/14/2015 in [place]  Date  01/31/15  Educator  [facility]  Instruction Review Code  2- Meets goals/outcomes      Relaxation: - Provides group, verbal and written instruction about the benefits of relaxation for patients with heart/lung disease. Also provides patients with examples of relaxation techniques. Flowsheet Row Pulmonary Rehab from 02/01/2016 in [facility] Cardiac and Pulmonary Rehab  Date  01/16/16  Educator  Kathreen Cornfield, [facility]  Instruction Review Code  2- Meets goals/outcomes      Knowledge Questionnaire Score:     Knowledge Questionnaire Score - 12/14/15 1324      Knowledge Questionnaire Score   Pre Score 9       Core Components/Risk Factors/Patient Goals at Admission:     Personal Goals and Risk Factors at Admission - 12/14/15 1209      Core Components/Risk Factors/Patient Goals on Admission    Weight Management Yes    Intervention Weight Management: Develop a combined nutrition and exercise program designed to reach desired caloric intake, while maintaining appropriate intake of nutrient and fiber, sodium and fats, and appropriate energy expenditure required for the weight goal.;Weight Management: Provide education and appropriate resources to help participant work on and attain dietary goals.;Weight Management/Obesity: Establish reasonable short term and long term weight goals.;Obesity:  Provide education and appropriate resources to help participant work on and attain dietary goals.   Admit Weight --  On prednisone currently. Hendy reports she needs to lose 10 lbs for her potential [facility]   Goal Weight: Short Term 140 lb (63.5 kg)   Goal Weight: Long Term 125 lb (56.7 kg)   Expected Outcomes Short Term: Continue to assess and modify interventions until short term weight is achieved;Weight Maintenance: Understanding of the daily nutrition guidelines, which includes 25-35% calories from fat, 7% or less cal from saturated fats, less than 24m cholesterol, less than 1.5gm of sodium, & 5 or more servings of fruits and vegetables daily;Weight Loss: Understanding of general recommendations for a balanced deficit meal plan, which promotes 1-2 lb weight loss per week and includes a negative energy balance of [phone] kcal/d;Understanding recommendations for meals to include 15-35% energy as protein, 25-35% energy from fat, 35-60% energy from carbohydrates, less than 2078mof dietary cholesterol, 20-35 gm of total fiber daily;Weight Gain: Understanding of general recommendations for a high calorie, high protein meal plan that promotes weight gain by distributing calorie intake throughout the day with the consumption for 4-5 meals, snacks, and/or supplements   Sedentary Yes   Intervention Provide advice, education, support and counseling about physical activity/exercise needs.;Develop an individualized exercise  prescription for aerobic and resistive training based on initial evaluation findings, risk stratification, comorbidities and participant's personal goals.   Expected Outcomes Achievement of increased cardiorespiratory fitness and enhanced flexibility, muscular endurance and strength shown through measurements of functional capacity and personal statement of participant.   Intervention Provide advice, education, support and counseling about physical activity/exercise needs.;Develop an individualized exercise prescription for aerobic and resistive training based on initial evaluation findings, risk stratification, comorbidities and participant's personal goals.   Expected Outcomes Achievement of increased cardiorespiratory fitness and enhanced flexibility, muscular endurance and strength shown through measurements of functional capacity and personal statement of participant.   Stress Yes   Intervention Offer individual and/or small group education and counseling on adjustment to heart disease, stress management and health-related lifestyle change. Teach and support self-help strategies.;Refer participants experiencing significant psychosocial distress to appropriate mental health specialists for further evaluation and treatment. When possible, include family members and significant others in education/counseling sessions.   Expected Outcomes Short Term: Participant demonstrates changes in health-related behavior, relaxation and other stress management skills, ability to obtain effective social support, and compliance with psychotropic medications if prescribed.;Long Term: Emotional wellbeing is indicated by absence of clinically significant psychosocial distress or social isolation.      Core Components/Risk Factors/Patient Goals Review:      Goals and Risk Factor Review    Row Name 12/19/15 1335 12/28/15 1330 01/07/16 1504 01/22/16 0735 01/29/16 0751     Core Components/Risk Factors/Patient Goals  Review   Personal Goals Review Improve shortness of breath with ADL's;Develop more efficient breathing techniques such as purse lipped breathing and diaphragmatic breathing and practicing self-pacing with activity. Sedentary;Increase Strength and Stamina Increase knowledge of respiratory medications and ability to use respiratory devices properly.;Improve shortness of breath with ADL's;Develop more efficient breathing techniques such as purse lipped breathing and diaphragmatic breathing and practicing self-pacing with activity. Sedentary;Increase Strength and Stamina;Weight Management/Obesity Develop more efficient breathing techniques such as purse lipped breathing and diaphragmatic breathing and practicing self-pacing with activity.;Improve shortness of breath with ADL's;Increase knowledge of respiratory medications and ability to use respiratory devices properly.;Increase Strength and Stamina;Sedentary   Review Reviewed pursed lip breathing technique with pt today to use  during exercise and at home. [place]ope is riding her bike at home on off days from rehab.  She starting to notice an increase in her strength and stamina. Ms Maciejewski has a good understanding of her MDI's. She does have problems with thrush and her Advair. Educated Ms Lynn on her MDI's and SVN being equally important, and if good technique with a spacer is performed, an MDI's can be the same amount of medicine as a SVN. We discussed increasing her oxygen on the treadmill, so she can get her full 10 minutes accomplished with out low O2Sat's and increased shortness of breath. Even with PLB, the treadmill is a challenge. Ms Kaman received a letter from the transplant team. She is required to exercise 3 days/week in [facility] and also loss 7-10lbs. She plans to meet with the dietitian one on one for help with this weight loss. Ms Dewing meets with the transplant team on 02/04/16 and 02/05/16. She has worked her exercise goal on the TM to 32mns. and  1.250m. [facility]'s list rquirement for transplant lists 1.5-2.0 on the TM, so that will be her next goal. She has a good understaning of her MDI's and her shortness of breath - uses pacing , PLB, and adjusts her oxygen indepently for increased exercise.   Expected Outcomes Pt will become independent in using pursed lip breathing to improve overall shortness of breath. DeNicholill continue to attend exercise and educaiton classes to boost her functional capacity.  -  - Work towards increasing her spend on the TM to 1.74m23m   RowPrestonme 03/05/16 1309 03/18/16 0926           Core Components/Risk Factors/Patient Goals Review   Personal Goals Review Increase knowledge of respiratory medications and ability to use respiratory devices properly.;Improve shortness of breath with ADL's;Develop more efficient breathing techniques such as purse lipped breathing and diaphragmatic breathing and practicing self-pacing with activity.;Increase Strength and Stamina;Weight Management/Obesity Weight Management/Obesity;Sedentary;Increase Strength and Stamina;Improve shortness of breath with ADL's;Increase knowledge of respiratory medications and ability to use respiratory devices properly.;Develop more efficient breathing techniques such as purse lipped breathing and diaphragmatic breathing and practicing self-pacing with activity.      Review DebIdalies been out for a few weeks, since she returned to work. She has been able to clear her schedule to attend Pulmonary Rehab 3 days a week as well as maintain her work. She continues to state understanding of properuse of her MDI.s  DebJustenentinues to work toward required weight loss and is down 7 - 8 pounds. She continues to manage her medications and uses her purse lipped breathing technique as needed.  Ms BalFagerstrom progressing toward her lung transplant. She weighed at 145.4lbs and her goal is under 140lbs. She has raised most of her funding and has her next meeting with the transplant  team on December 6th. I gave her a spacer to use with her albuterol MDI. She has a good understanding of her MDI's and oxygen - self adjusting the flow with increased exercise to manage her shortness of breath and meet her time goals on the exercise equipment. PLB is a part of her active life and managing her pulmonary fibrosis.      Expected Outcomes Continue compliance with medications and use of MDI's. Continue to work toard reaching the weight loss, fund raising and exercise goals required to be placed on the transplant list to see her name on the list and ready to start "[employer] -  Core Components/Risk Factors/Patient Goals at Discharge (Final Review):      Goals and Risk Factor Review - 03/18/16 0926      Core Components/Risk Factors/Patient Goals Review   Personal Goals Review Weight Management/Obesity;Sedentary;Increase Strength and Stamina;Improve shortness of breath with ADL's;Increase knowledge of respiratory medications and ability to use respiratory devices properly.;Develop more efficient breathing techniques such as purse lipped breathing and diaphragmatic breathing and practicing self-pacing with activity.   Review Ms Dohn is progressing toward her lung transplant. She weighed at 145.4lbs and her goal is under 140lbs. She has raised most of her funding and has her next meeting with the transplant team on December 6th. I gave her a spacer to use with her albuterol MDI. She has a good understanding of her MDI's and oxygen - self adjusting the flow with increased exercise to manage her shortness of breath and meet her time goals on the exercise equipment. PLB is a part of her active life and managing her pulmonary fibrosis.      ITP Comments:     ITP Comments    Row Name 12/14/15 1215 12/14/15 1216 12/14/15 1221 12/14/15 1226 01/09/16 1321   ITP Comments taking xanax when she needs it. Karah reports it is stressful about the possibility of a lung transplant and all  the workup.   Pulse ox down to 82% at end of 6 minute walk even on 8 liters of supplemental oxygen. Pulse ox came up to 92% with rest.  Right femerol area is sore Deb reports from her heart cath last week and her right groin area and leg are bruised. Deb reports her right shoulder hurts from the position she had to be for her ultrasound of her liver at [facility] (being evaluated for a lung transplant).  Deb wants to meet with the Registered Dietician to help her lose weight. Deb has cut out bread and sodas. Increased [place] from 6 to 8 liters of oxygen while on XR.  Increased [place] to 15 liters oxygen using her own nasal cannula with a reservoir while Kaisley was on the treadmill.  Lulia was able to walk at a speed of 1.2 mph with 0.5% incline on the treadmill for 10 minutes without stopping.  This is the first time Karalynn has been able to do this.     [place] Name 02/06/16 1606 03/06/16 1121 04/30/16 1344       ITP Comments Ms Moskal had a PFT at [facility] with a DLCO of 17%. She also had an ABG with PO2 of 42. 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 Renise has not been able to attend regularly as she is back at work. Ms Gadea has emailed me. She is very busy with work, fund raising, and her up coming [place] appointment on 05/14/16..        Comments: 30 Day Review

## 2016-05-08 ENCOUNTER — Ambulatory Visit: Payer: 59 | Admitting: Pulmonary Disease

## 2016-05-08 ENCOUNTER — Telehealth: Payer: Self-pay | Admitting: Pulmonary Disease

## 2016-05-08 NOTE — Telephone Encounter (Signed)
noted 

## 2016-05-08 NOTE — Telephone Encounter (Signed)
Called and spoke to pt. Pt states she was calling let us know she can no longer tolerate her POC and is now switching back to tanks. Pt has already called to let DME know and they will drop off needed tanks for pt.   Will let BQ know as FYI.

## 2016-05-09 ENCOUNTER — Encounter: Payer: 59 | Attending: Pulmonary Disease

## 2016-05-09 DIAGNOSIS — J8410 Pulmonary fibrosis, unspecified: Secondary | ICD-10-CM | POA: Insufficient documentation

## 2016-05-18 ENCOUNTER — Other Ambulatory Visit: Payer: Self-pay | Admitting: Family Medicine

## 2016-05-19 ENCOUNTER — Other Ambulatory Visit: Payer: Self-pay | Admitting: Pulmonary Disease

## 2016-05-20 ENCOUNTER — Encounter: Payer: Self-pay | Admitting: Respiratory Therapy

## 2016-05-22 ENCOUNTER — Ambulatory Visit: Payer: 59 | Admitting: Pulmonary Disease

## 2016-05-26 ENCOUNTER — Encounter: Payer: Self-pay | Admitting: Respiratory Therapy

## 2016-05-26 DIAGNOSIS — J8410 Pulmonary fibrosis, unspecified: Secondary | ICD-10-CM

## 2016-05-26 NOTE — Progress Notes (Signed)
Pulmonary Individual Treatment Plan  Patient Details  Name: Erika Martinez MRN: [id number] Date of Birth: 1956-06-08 Referring Provider:   Flowsheet Row Pulmonary Rehab from 12/14/2015 in [facility] Cardiac and Pulmonary Rehab  Referring Provider  McQuaid      Initial Encounter Date:  Flowsheet Row Pulmonary Rehab from 12/14/2015 in [facility] Cardiac and Pulmonary Rehab  Date  12/14/15  Referring Provider  McQuaid      Visit Diagnosis: Pulmonary fibrosis ([place])  Patient's Home Medications on Admission:  Current Outpatient Prescriptions:    ADVAIR DISKUS 500-50 MCG/DOSE AEPB, INHALE 1 PUFF INTO THE LUNGS 2 (TWO) TIMES DAILY., Disp: 60 each, Rfl: 5   albuterol (PROVENTIL) (2.5 MG/3ML) 0.083% nebulizer solution, Take 3 mLs (2.5 mg total) by nebulization every 4 (four) hours as needed for wheezing or shortness of breath. DX Code: J84.10, Disp: 75 mL, Rfl: 12   azaTHIOprine (IMURAN) 50 MG tablet, Take 2 tablets (100 mg total) by mouth daily. Daily, Disp: 60 tablet, Rfl: 5   azaTHIOprine (IMURAN) 50 MG tablet, TAKE 2 TABLETS BY MOUTH EVERY DAY, Disp: 60 tablet, Rfl: 5   azithromycin (ZITHROMAX Z-PAK) 250 MG tablet, Take as needed, Disp: 6 each, Rfl: 0   cholecalciferol (VITAMIN D) 1000 UNITS tablet, Take 2,000 Units by mouth daily., Disp: , Rfl:    diltiazem (CARDIZEM) 60 MG tablet, TAKE 1 TABLET (60 MG TOTAL) BY MOUTH DAILY., Disp: 30 tablet, Rfl: 2   fluconazole (DIFLUCAN) 150 MG tablet, TAKE 1 TABLET WEEKLY AS NEEDED FOR THRUSH, Disp: 4 tablet, Rfl: 2   fluticasone (FLONASE) 50 MCG/ACT nasal spray, Place 2 sprays into both nostrils daily., Disp: 16 g, Rfl: 6   levofloxacin (LEVAQUIN) 500 MG tablet, Take 1 tablet (500 mg total) by mouth daily., Disp: 5 tablet, Rfl: 0   loratadine (CLARITIN) 10 MG tablet, TAKE 1 TABLET (10 MG TOTAL) BY MOUTH 2 (TWO) TIMES DAILY., Disp: 60 tablet, Rfl: 3   nystatin (MYCOSTATIN) 100000 UNIT/ML suspension, TAKE 1 TABLESPOON BY MOUTH BEFORE MEALS AND AT BEDTIME  (SWISH AND SPIT OUT), Disp: 473 mL, Rfl: 2   omeprazole (PRILOSEC) 40 MG capsule, TAKE 1 CAPSULE TWICE DAILY, Disp: 60 capsule, Rfl: 2   PEG 3350-KCl-NaBcb-NaCl-NaSulf (PEG-3350/ELECTROLYTES) 236 g SOLR, Take by mouth., Disp: , Rfl:    predniSONE (DELTASONE) 10 MG tablet, Take 0.5 tablets (5 mg total) by mouth daily with breakfast., Disp: 30 tablet, Rfl: 0   sulfamethoxazole-trimethoprim (BACTRIM DS,SEPTRA DS) 800-160 MG tablet, TAKE 1 TABLET BY MOUTH 3 (THREE) TIMES A WEEK., Disp: , Rfl: 5   sulfamethoxazole-trimethoprim (BACTRIM DS,SEPTRA DS) 800-160 MG tablet, TAKE 1 TABLET BY MOUTH 3 (THREE) TIMES A WEEK., Disp: 12 tablet, Rfl: 5   triamcinolone cream (KENALOG) 0.1 %, Apply 1 application topically 2 (two) times daily., Disp: 30 g, Rfl: 0   VENTOLIN HFA 108 (90 Base) MCG/ACT inhaler, INHALE 2 PUFFS INTO THE LUNGS EVERY 4 (FOUR) HOURS AS NEEDED FOR WHEEZING OR SHORTNESS OF BREATH., Disp: 18 g, Rfl: 5   vitamin B-12 (CYANOCOBALAMIN) 250 MCG tablet, Place 250 mcg under the tongue every other day. , Disp: , Rfl:   Past Medical History: Past Medical History:  Diagnosis Date   Abnormal hemoglobin (HCC)    Allergy    Anxiety    Asthma    Controlled insomnia    Erythrocytosis    GERD (gastroesophageal reflux disease)    Hep C w/o coma, chronic (HCC)    Hypertension    IBS (irritable bowel syndrome)    Lumbago  Mitral valve prolapse    Oxygen dependent    Plantar wart    Pulmonary fibrosis (HCC)    Scleroderma (HCC)    Dr. Pennie Banter   Seasonal allergies    Spondylolisthesis    Symptomatic menopausal or female climacteric states     Tobacco Use: History  Smoking Status   Never Smoker  Smokeless Tobacco   Never Used    Labs: Recent Review Flowsheet Data    Labs for ITP Cardiac and Pulmonary Rehab Latest Ref Rng & Units 07/14/2013 07/05/2015 08/02/2015   Cholestrol 100 - 199 mg/dL 169 - 196   LDLCALC 0 - 99 mg/dL 95 - 112(H)   HDL >39 mg/dL 58 - 68    Trlycerides 0 - 149 mg/dL 78 - 78   Hemoglobin A1c 4.8 - 5.6 % - 6.3(H) -       ADL UCSD:     Pulmonary Assessment Scores    Row Name 12/14/15 1350         ADL UCSD   ADL Phase Entry     SOB Score total 43     Rest 0     Walk 2     Stairs 4     Bath 1     Dress 1     Shop 2        Pulmonary Function Assessment:   Exercise Target Goals:    Exercise Program Goal: Individual exercise prescription set with THRR, safety & activity barriers. Participant demonstrates ability to understand and report RPE using BORG scale, to self-measure pulse accurately, and to acknowledge the importance of the exercise prescription.  Exercise Prescription Goal: Starting with aerobic activity 30 plus minutes a day, 3 days per week for initial exercise prescription. Provide home exercise prescription and guidelines that participant acknowledges understanding prior to discharge.  Activity Barriers & Risk Stratification:     Activity Barriers & Cardiac Risk Stratification - 12/14/15 1204      Activity Barriers & Cardiac Risk Stratification   Activity Barriers Deconditioning;Joint Problems      6 Minute Walk:     6 Minute Walk    Row Name 12/14/15 1204 12/14/15 1216 12/14/15 1301     6 Minute Walk   Phase Initial  --  --   Distance 810 feet  --  --   Walk Time 5.81 minutes  --  --   # of Rest Breaks 1  --  --   RPE 15  --  --   Perceived Dyspnea  3  "tired " on 8 liters oxgyen for 6 minute walk --   Pulse ox down to 82% at end of 6 minute walk even on 8 liters of supplemental oxygen. Pulse ox came up to 92% with rest.  3   Resting HR 80 bpm  --  --   Resting BP 122/70  --  --   Max Ex. HR 118 bpm  --  --   Max Ex. BP 138/70  --  --   Row Name 12/14/15 1302 12/14/15 1312       6 Minute Walk   Phase Initial Initial       Initial Exercise Prescription:     Initial Exercise Prescription - 12/14/15 1400      Date of Initial Exercise RX and Referring Provider   Date  12/14/15   Referring Provider McQuaid     Oxygen   Oxygen Continuous   Liters 4     Treadmill  MPH 1.4   Grade 1   Minutes 15   METs 2.26     NuStep   Level 2   Minutes 15   METs 2.3     REL-XR   Level 1   Minutes 15   METs 2.3     Prescription Details   Frequency (times per week) 3   Duration Progress to 30 minutes of continuous aerobic without signs/symptoms of physical distress     Intensity   THRR 40-80% of Max Heartrate 111-142   Ratings of Perceived Exertion 11-13   Perceived Dyspnea 0-4     Progression   Progression Continue to progress workloads to maintain intensity without signs/symptoms of physical distress.     Resistance Training   Training Prescription Yes   Weight 2   Reps 10-15      Perform Capillary Blood Glucose checks as needed.  Exercise Prescription Changes:     Exercise Prescription Changes    Row Name 12/26/15 1400 01/09/16 1300 01/23/16 1400 02/06/16 1300 02/21/16 1100     Exercise Review   Progression Yes Yes Yes Yes Yes     Response to Exercise   Blood Pressure (Admit) 110/60 118/64 112/66 120/66 148/78   Blood Pressure (Exercise) 126/64 138/64 150/84 124/74 112/66   Blood Pressure (Exit) 108/50 140/70 124/62 104/62 106/74   Heart Rate (Admit) 106 bpm 114 bpm 110 bpm 94 bpm 100 bpm   Heart Rate (Exercise) 115 bpm 120 bpm 118 bpm 112 bpm 106 bpm   Heart Rate (Exit) 104 bpm 100 bpm 108 bpm 100 bpm 95 bpm   Oxygen Saturation (Admit) 96 % 89 % 97 % 96 % 100 %   Oxygen Saturation (Exercise) 92 % 90 % 93 % 92 % 89 %   Oxygen Saturation (Exit) 94 % 96 % 100 % 95 % 100 %   Rating of Perceived Exertion (Exercise) '13 13 13 13 13   ' Perceived Dyspnea (Exercise) '3 3 3 3 3   ' Comments  -- Home Exercise Guidelines given 01/09/16  --  --  --     Progression   Progression Continue to progress workloads to maintain intensity without signs/symptoms of physical distress. Continue to progress workloads to maintain intensity without signs/symptoms  of physical distress. Continue to progress workloads to maintain intensity without signs/symptoms of physical distress. Continue to progress workloads to maintain intensity without signs/symptoms of physical distress. Continue to progress workloads to maintain intensity without signs/symptoms of physical distress.     Resistance Training   Training Prescription Yes Yes Yes Yes Yes   Weight 2 6  exercise O2 6-8L '3 3 3   ' Reps 10-15 10-15 10-15 10-15 10-12     Interval Training   Interval Training  --  -- No  -- No     Oxygen   Oxygen Continuous  -- Continuous  -- Continuous   Liters '4 2 8 8  15 ' L/min on TM 8     Treadmill   MPH 1.2 1.2 1.2 1.2 1.3   Grade 0 0.5 0 0 0   Minutes '10 10 12 25 20   ' METs  -- 2 1.92 1.92 2     NuStep   Level  -- 2  --  --  --   Minutes  -- 15  --  --  --   METs  -- 1.5  --  --  --     REL-XR   Level '3 3 3 4 ' 4  Minutes '15 15 15 15 15   ' METs 1.9 2.1  -- 3 2.6     Biostep-RELP   Level  -- 2  --  --  --   Minutes  -- 15  --  --  --   METs  -- 2  --  --  --     Home Exercise Plan   Plans to continue exercise at  -- Home  Stationary bike  --  --  --   Frequency  -- Add 4 additional days to program exercise sessions.  --  --  --   Row Name 03/06/16 1100 03/18/16 1400 04/16/16 1600         Exercise Review   Progression Yes Yes  --       Response to Exercise   Blood Pressure (Admit)  -- 124/68  --     Blood Pressure (Exercise) 160/80 152/70 148/72     Blood Pressure (Exit) 130/80 128/72 126/54     Heart Rate (Admit) 105 bpm 102 bpm 104 bpm     Heart Rate (Exercise) 106 bpm 115 bpm 102 bpm     Heart Rate (Exit) 98 bpm 99 bpm 89 bpm     Oxygen Saturation (Admit) 100 % 93 % 95 %     Oxygen Saturation (Exercise) 97 % 86 % 94 %     Oxygen Saturation (Exit) 99 % 100 % 98 %     Rating of Perceived Exertion (Exercise) '13 13 12     ' Perceived Dyspnea (Exercise) '3 3 2     ' Symptoms  --  -- none     Comments  -- Home Exercise Guidelines given 01/09/16  Home Exercise Guidelines given 01/09/16     Duration  -- Progress to 45 minutes of aerobic exercise without signs/symptoms of physical distress Progress to 45 minutes of aerobic exercise without signs/symptoms of physical distress     Intensity  -- THRR unchanged  111-142 THRR unchanged       Progression   Progression Continue to progress workloads to maintain intensity without signs/symptoms of physical distress. Continue to progress workloads to maintain intensity without signs/symptoms of physical distress. Continue to progress workloads to maintain intensity without signs/symptoms of physical distress.     Average METs  -- 2.45 2.13       Resistance Training   Training Prescription Yes Yes Yes     Weight 3 3 right 2 left 3 right 2 left     Reps 10-12 10-12 10-12       Interval Training   Interval Training No No No       Oxygen   Oxygen Continuous Continuous Continuous     Liters '8 8 8  ' 15L on treadmill       Treadmill   MPH 1.2 1.3 1.3     Grade 0 0 0     Minutes 20 20 --  omitted at last visit     METs 1.92 2 2       NuStep   Level  --  -- 4     Minutes  --  -- 15     METs  --  -- 1.5       REL-XR   Level '5 4 3     ' Minutes '15 15 15     ' METs  -- 2.9 2.9       Home Exercise Plan   Plans to continue exercise at  -- Home  Stationary  bike Home  Stationary bike     Frequency  -- Add 4 additional days to program exercise sessions. Add 4 additional days to program exercise sessions.        Exercise Comments:     Exercise Comments    Row Name 12/14/15 1217 12/19/15 1337 01/09/16 1312 01/09/16 1356 01/23/16 1423   Exercise Comments  Pulse ox down to 82% at end of 6 minute walk even on 8 liters of supplemental oxygen. Pulse ox came up to 92% with rest.  First full day of exercise!  Patient was oriented to gym and equipment including functions, settings, policies, and procedures.  Patient's individual exercise prescription and treatment plan were reviewed.  All starting  workloads were established based on the results of the 6 minute walk test done at initial orientation visit.  The plan for exercise progression was also introduced and progression will be customized based on patient's performance and goals. Reviewed home exercise with pt today.  Pt plans to continue riding stationary bike at home  for exercise.  We discussed her progression with exercise at home to increase her current time. Reviewed THR, pulse, RPE, sign and symptoms, and when to call 911 or MD.  Also discussed weather considerations and indoor options.  Pt voiced understanding. Anadelia is continuing to improve her intensity and duration of exercise. Brownie continues to progress well with exercise.   Neligh Name 02/06/16 1329 02/06/16 1605 02/21/16 1152 03/06/16 1119 03/18/16 1452   Exercise Comments Indianna is progressing well with exercise.  She has added resistnace on the XR and more time to TM. Ms Harlacher had an oxygen titration [facility] at [facility] for her Lung Transplant surgery, so we will not perform a Mid69md DNatsukohas progressed well with exercise. DKatelynehas not been able to attend regularly as she is back at work. DDarcelis trying to find a routine with her work schedule to allow her to get to rehab.  She continues to make improvements when she is here.  We will continue to monitor her progression.   RSouth HavenName 04/02/16 1551 04/16/16 1614 04/29/16 1550       Exercise Comments DCharlissahas been out with work since last review. DNisahas had one visit since last review.  We will continue to monitor her progression. DCharnellehas not been in since last review.  No progression made.        Discharge Exercise Prescription (Final Exercise Prescription Changes):     Exercise Prescription Changes - 04/16/16 1600      Response to Exercise   Blood Pressure (Exercise) 148/72   Blood Pressure (Exit) 126/54   Heart Rate (Admit) 104 bpm   Heart Rate (Exercise) 102 bpm   Heart Rate (Exit) 89 bpm   Oxygen Saturation (Admit)  95 %   Oxygen Saturation (Exercise) 94 %   Oxygen Saturation (Exit) 98 %   Rating of Perceived Exertion (Exercise) 12   Perceived Dyspnea (Exercise) 2   Symptoms none   Comments Home Exercise Guidelines given 01/09/16   Duration Progress to 45 minutes of aerobic exercise without signs/symptoms of physical distress   Intensity THRR unchanged     Progression   Progression Continue to progress workloads to maintain intensity without signs/symptoms of physical distress.   Average METs 2.13     Resistance Training   Training Prescription Yes   Weight 3 right 2 left   Reps 10-12     Interval Training   Interval Training No  Oxygen   Oxygen Continuous   Liters 8  15L on treadmill     Treadmill   MPH 1.3   Grade 0   Minutes --  omitted at last visit   METs 2     NuStep   Level 4   Minutes 15   METs 1.5     REL-XR   Level 3   Minutes 15   METs 2.9     Home Exercise Plan   Plans to continue exercise at Home  Stationary bike   Frequency Add 4 additional days to program exercise sessions.       Nutrition:  Target Goals: Understanding of nutrition guidelines, daily intake of sodium <1570m, cholesterol <2035m calories 30% from fat and 7% or less from saturated fats, daily to have 5 or more servings of fruits and vegetables.  Biometrics:     Pre Biometrics - 12/14/15 1321      Pre Biometrics   Height 5' 1.2" (1.554 m)   Weight 151 lb 11.2 oz (68.8 kg)   Waist Circumference 42 inches   Hip Circumference 39.8 inches   Waist to Hip Ratio 1.06 %   BMI (Calculated) 28.5   Single Leg Stand 30 seconds       Nutrition Therapy Plan and Nutrition Goals:     Nutrition Therapy & Goals - 12/14/15 1226      Nutrition Therapy   Diet --  Deb wants to meet with the Registered Dietician to help her lose weight. Deb has cut out bread and sodas.       Nutrition Discharge: Rate Your Plate Scores:   Psychosocial: Target Goals: Acknowledge presence or absence of  depression, maximize coping skills, provide positive support system. Participant is able to verbalize types and ability to use techniques and skills needed for reducing stress and depression.  Initial Review & Psychosocial Screening:     Initial Psych Review & Screening - 12/14/15 13RosepineYes      Quality of Life Scores:     Quality of Life - 12/14/15 1323      Quality of Life Scores   Health/Function Pre 20.69 %   Socioeconomic Pre 20.38 %   Psych/Spiritual Pre 21 %   Family Pre 20 %   GLOBAL Pre 20.58 %      PHQ-9: Recent Review Flowsheet Data    [facility] 03/26/2016 12/14/2015 10/03/2015 07/03/2015 04/06/2015   Decreased Interest 0 1 0 0 0   Down, Depressed, Hopeless 0 1 0 0 0   PHQ - 2 Score 0 2 0 0 0   Altered sleeping - 1 - - -   Tired, decreased energy - 2 - - -   Change in appetite - 2 - - -   Feeling bad or failure about yourself  - 1 - - -   Trouble concentrating - 0 - - -   Moving slowly or fidgety/restless - 0 - - -   Suicidal thoughts - 0 - - -   PHQ-9 Score - 8 - - -   Difficult doing work/chores - Somewhat difficult - - -      Psychosocial Evaluation and Intervention:     Psychosocial Evaluation - 01/07/16 1231      Psychosocial Evaluation & Interventions   Comments Counselor met with Ms. BaValdiviaoday as she has reutrned to this program.   She is [age] old and  reports she is in the lung transplant process to see if she will be chosen.  Ms. Chamorro has Pulmonary Fibrosis.  She has a strong support system with her sister and a daughter who live close by.  She is actively involved in her local church community.  Ms. Matuska states she sleeps "okay" with some nights needing to take a prescribed medication to help with her anxiety.  She states her appetite is good most of the time and she denies a history of depression, but some anxiety which is being treated.  Ms. 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  Ms. Junio goals for this program are to increase her stamina and strength.  Counselor will continue to follow with her during the course of this program.         Psychosocial Re-Evaluation:     Psychosocial Re-Evaluation    Row Name 01/21/16 1655 03/05/16 1309 03/19/16 1027 05/20/16 0644       Psychosocial Re-Evaluation   Comments Counselor follow up with Ms. Bobbye Charleston today.  She reports doing well and enjoying this program; including experiencing benefits of increased stamina.  She reports being approved for the lung transplant list and has had to go through multiple tests and meet extensive criteria for this to occur.  Ms. Cosner reports this is stressful for her currently as well as school starting back in a few weeks.  Ms. Laakso reports sleeping "okay" currently and hopes that will continue, although she has to wean off her medications that have helped her sleep PRN.  Counselor will continue to follow with Ms. Bubar throughout the course of this program.  Patient psychosocial assessment reveals no barriers to participation in Pulmonary Rehab. Psychosocial areas that are currently affecting patient's rehab experience include concerns about , financial resources,   And Work. Patient does continue to exhibit positive  coping skills to deal with her psychosocial concerns. Offered emotional support and reassurance. Patient does feel she is making progress toward Pulmonary Rehab goals. Patient reports her health and activity level has improved in the past 30 days as evidenced by patient's report of increased ability to manage her daily activities and return to work for an eight hour day..  Patient reports  feeling positive about current and projected progression in Pulmonary Rehab. After reviewing the patient's treatment plan, the patient is making progress toward  Pulmonary Rehab goals. Patient's rate of progress toward rehab goals is good. Plan of action to help patient continue to work towards rehab goals include expecting her to be able to attend program 3 days a week even though she has returned to work. Watching progress towards her ability to be placed on the transplant list. Kenyah has raised almost 9 thousand dollars, has stopped taking the Xanax and is close to her weight loss goal required to be placed on the list.  Will continue to monitor and evaluate progress toward psychosocial goal(s). Counselor follow up with Ms. Warnke on this date reporting dealing with a great deal of stress in regards to being on the transplant list.  Although she has raised a great deal of the funds required, there is still some to go; she also has had to stop taking her Xanax which has been helpful for dealing with stress and anxiety as well as sleep.  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  Ms. 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  She continues to have a positive outlook and reports her faith and exercise and leaning on friends help her cope with all that she is currently going through.  Counselor encouraged her to continue to practice her stress management skills and offered support and encouragement for all the progress made so far in this process.   Ms Dionisio Paschal has been in contact with me through email. She is anxious for the results of her final [facility] meeting and the closeness of the lung transplant surgery. She was very stressed with the required fundraising for the surgery, but with the help of her family and church, she raised the money. Ms Bench will have great support with the surgery.      Education: Education Goals: Education classes will be provided on a weekly  basis, covering required topics. Participant will state understanding/return demonstration of topics presented.  Learning Barriers/Preferences:     Learning Barriers/Preferences - 12/14/15 1204      Learning Barriers/Preferences   Learning Barriers Sight  wears reading glasses      Education Topics: Initial Evaluation Education: - Verbal, written and demonstration of respiratory meds, RPE/PD scales, oximetry and breathing techniques. Instruction on use of nebulizers and MDIs: cleaning and proper use, rinsing mouth with steroid doses and importance of monitoring MDI activations. Flowsheet Row Documentation from 03/14/2015 in [place]  Date  12/05/14  Educator  LB  Instruction Review Code  2- meets goals/outcomes      General Nutrition Guidelines/Fats and Fiber: -Group instruction provided by verbal, written material, models and posters to present the general guidelines for heart healthy nutrition. Gives an explanation and review of dietary fats and fiber. Flowsheet Row Pulmonary Rehab from 02/01/2016 in [facility] Cardiac and Pulmonary Rehab  Date  12/31/15  Educator  CR  Instruction Review Code  2- meets goals/outcomes      Controlling Sodium/Reading Food Labels: -Group verbal and written material supporting the discussion of sodium use in heart healthy nutrition. Review and explanation with models, verbal and written materials for utilization of the food label. Flowsheet Row Pulmonary Rehab from 02/01/2016 in [facility] Cardiac and Pulmonary Rehab  Date  01/07/16  Educator  CR  Instruction Review Code  2- meets goals/outcomes      Exercise Physiology & Risk Factors: - Group verbal and written instruction with models to review the exercise physiology of the cardiovascular system and associated critical values. Details cardiovascular disease risk factors and the goals associated with each risk factor. Flowsheet Row Pulmonary Rehab from 02/01/2016 in  [facility] Cardiac and Pulmonary Rehab  Date  12/26/15  Educator  Alberteen Sam and Nada Maclachlan   Instruction Review Code  2- meets goals/outcomes      Aerobic Exercise & Resistance Training: - Gives group verbal and written discussion on the health impact of inactivity. On the components of aerobic and resistive training programs and the benefits of this training and how to safely progress through these programs.   Flexibility, Balance, General Exercise Guidelines: - Provides group verbal and written instruction on the benefits of flexibility and balance training programs. Provides general exercise guidelines with specific guidelines to those with heart or lung disease. Demonstration and skill practice provided.   Stress Management: - Provides  group verbal and written instruction about the health risks of elevated stress, cause of high stress, and healthy ways to reduce stress. Flowsheet Row Pulmonary Rehab from 02/01/2016 in [facility] Cardiac and Pulmonary Rehab  Date  12/19/15  Educator  Kathreen Cornfield  Instruction Review Code  2- meets goals/outcomes      Depression: - Provides group verbal and written instruction on the correlation between heart/lung disease and depressed mood, treatment options, and the stigmas associated with seeking treatment.   Exercise & Equipment Safety: - Individual verbal instruction and demonstration of equipment use and safety with use of the equipment. Flowsheet Row Pulmonary Rehab from 02/01/2016 in [facility] Cardiac and Pulmonary Rehab  Date  12/14/15  Educator  C. EnterkinRN  Instruction Review Code  1- partially meets, needs review/practice      Infection Prevention: - Provides verbal and written material to individual with discussion of infection control including proper hand washing and proper equipment cleaning during exercise session. Flowsheet Row Pulmonary Rehab from 02/01/2016 in [facility] Cardiac and Pulmonary Rehab  Date  12/14/15  Educator  C.  EnterkinRN  Instruction Review Code  2- meets goals/outcomes      Falls Prevention: - Provides verbal and written material to individual with discussion of falls prevention and safety. Flowsheet Row Pulmonary Rehab from 02/01/2016 in [facility] Cardiac and Pulmonary Rehab  Date  12/14/15  Educator  C. [place]  Instruction Review Code  2- meets goals/outcomes      Diabetes: - Individual verbal and written instruction to review signs/symptoms of diabetes, desired ranges of glucose level fasting, after meals and with exercise. Advice that pre and post exercise glucose checks will be done for 3 sessions at entry of program.   Chronic Lung Diseases: - Group verbal and written instruction to review new updates, new respiratory medications, new advancements in procedures and treatments. Provide informative websites and "800" numbers of self-education. Flowsheet Row Pulmonary Rehab from 02/01/2016 in [facility] Cardiac and Pulmonary Rehab  Date  12/24/15  Educator  LB  Instruction Review Code  2- meets goals/outcomes      Lung Procedures: - Group verbal and written instruction to describe testing methods done to diagnose lung disease. Review the outcome of test results. Describe the treatment choices: Pulmonary Function Tests, ABGs and oximetry. Flowsheet Row Documentation from 03/14/2015 in [place]  Date  01/12/15  Educator  sj  Instruction Review Code  2- meets goals/outcomes      Energy Conservation: - Provide group verbal and written instruction for methods to conserve energy, plan and organize activities. Instruct on pacing techniques, use of adaptive equipment and posture/positioning to relieve shortness of breath. Flowsheet Row Documentation from 03/14/2015 in [place]  Date  01/24/15  Educator  SW  Instruction Review Code  2- meets goals/outcomes      Triggers: - Group verbal and written instruction to  review types of environmental controls: home humidity, furnaces, filters, dust mite/pet prevention, HEPA vacuums. To discuss weather changes, air quality and the benefits of nasal washing. Flowsheet Row Documentation from 03/14/2015 in [place]  Date  12/18/14  Educator  LB  Instruction Review Code  2- meets goals/outcomes      Exacerbations: - Group verbal and written instruction to provide: warning signs, infection symptoms, calling MD promptly, preventive modes, and value of vaccinations. Review: effective airway clearance, coughing and/or vibration techniques. Create an Sports administrator. Flowsheet Row Pulmonary Rehab from 02/01/2016 in [facility]  Cardiac and Pulmonary Rehab  Date  01/21/16  Educator  LB  Instruction Review Code  2- meets goals/outcomes      Oxygen: - Individual and group verbal and written instruction on oxygen therapy. Includes supplement oxygen, available portable oxygen systems, continuous and intermittent flow rates, oxygen safety, concentrators, and Medicare reimbursement for oxygen. Flowsheet Row Documentation from 03/14/2015 in [place]  Date  12/05/14  Educator  LB  Instruction Review Code  2- meets goals/outcomes      Respiratory Medications: - Group verbal and written instruction to review medications for lung disease. Drug class, frequency, complications, importance of spacers, rinsing mouth after steroid MDI's, and proper cleaning methods for nebulizers. Flowsheet Row Documentation from 03/14/2015 in [place]  Date  12/22/14  Educator  [place]  Instruction Review Code  2- meets goals/outcomes [Spacer and instructions given to pt. ]      AED/CPR: - Group verbal and written instruction with the use of models to demonstrate the basic use of the AED with the basic ABC's of resuscitation. Flowsheet Row Pulmonary Rehab from 02/01/2016 in [facility] Cardiac and  Pulmonary Rehab  Date  01/11/16  Educator  CE  Instruction Review Code  2- meets goals/outcomes      Breathing Retraining: - Provides individuals verbal and written instruction on purpose, frequency, and proper technique of diaphragmatic breathing and pursed-lipped breathing. Applies individual practice skills. Flowsheet Row Pulmonary Rehab from 02/01/2016 in [facility] Cardiac and Pulmonary Rehab  Date  12/19/15  Educator  [facility]  Instruction Review Code  2- meets goals/outcomes      Anatomy and Physiology of the Lungs: - Group verbal and written instruction with the use of models to provide basic lung anatomy and physiology related to function, structure and complications of lung disease. Flowsheet Row Pulmonary Rehab from 02/01/2016 in [facility] Cardiac and Pulmonary Rehab  Date  02/01/16  Educator  [place]  Instruction Review Code  2- meets goals/outcomes      Heart Failure: - Group verbal and written instruction on the basics of heart failure: signs/symptoms, treatments, explanation of ejection fraction, enlarged heart and cardiomyopathy. Flowsheet Row Pulmonary Rehab from 02/01/2016 in [facility] Cardiac and Pulmonary Rehab  Date  01/25/16  Educator  CE  Instruction Review Code  2- meets goals/outcomes      Sleep Apnea: - Individual verbal and written instruction to review Obstructive Sleep Apnea. Review of risk factors, methods for diagnosing and types of masks and machines for OSA.   Anxiety: - Provides group, verbal and written instruction on the correlation between heart/lung disease and anxiety, treatment options, and management of anxiety. Flowsheet Row Documentation from 03/14/2015 in [place]  Date  01/31/15  Educator  [facility]  Instruction Review Code  2- Meets goals/outcomes      Relaxation: - Provides group, verbal and written instruction about the benefits of relaxation for patients with heart/lung disease. Also provides patients with examples of  relaxation techniques. Flowsheet Row Pulmonary Rehab from 02/01/2016 in [facility] Cardiac and Pulmonary Rehab  Date  01/16/16  Educator  Kathreen Cornfield, [facility]  Instruction Review Code  2- Meets goals/outcomes      Knowledge Questionnaire Score:     Knowledge Questionnaire Score - 12/14/15 1324      Knowledge Questionnaire Score   Pre Score 9       Core Components/Risk Factors/Patient Goals at Admission:     Personal Goals and Risk Factors at Admission -  12/14/15 1209      Core Components/Risk Factors/Patient Goals on Admission    Weight Management Yes   Intervention Weight Management: Develop a combined nutrition and exercise program designed to reach desired caloric intake, while maintaining appropriate intake of nutrient and fiber, sodium and fats, and appropriate energy expenditure required for the weight goal.;Weight Management: Provide education and appropriate resources to help participant work on and attain dietary goals.;Weight Management/Obesity: Establish reasonable short term and long term weight goals.;Obesity: Provide education and appropriate resources to help participant work on and attain dietary goals.   Admit Weight --  On prednisone currently. Jaslynn reports she needs to lose 10 lbs for her potential [facility]   Goal Weight: Short Term 140 lb (63.5 kg)   Goal Weight: Long Term 125 lb (56.7 kg)   Expected Outcomes Short Term: Continue to assess and modify interventions until short term weight is achieved;Weight Maintenance: Understanding of the daily nutrition guidelines, which includes 25-35% calories from fat, 7% or less cal from saturated fats, less than 237m cholesterol, less than 1.5gm of sodium, & 5 or more servings of fruits and vegetables daily;Weight Loss: Understanding of general recommendations for a balanced deficit meal plan, which promotes 1-2 lb weight loss per week and includes a negative energy balance of [phone] kcal/d;Understanding recommendations  for meals to include 15-35% energy as protein, 25-35% energy from fat, 35-60% energy from carbohydrates, less than 2073mof dietary cholesterol, 20-35 gm of total fiber daily;Weight Gain: Understanding of general recommendations for a high calorie, high protein meal plan that promotes weight gain by distributing calorie intake throughout the day with the consumption for 4-5 meals, snacks, and/or supplements   Sedentary Yes   Intervention Provide advice, education, support and counseling about physical activity/exercise needs.;Develop an individualized exercise prescription for aerobic and resistive training based on initial evaluation findings, risk stratification, comorbidities and participant's personal goals.   Expected Outcomes Achievement of increased cardiorespiratory fitness and enhanced flexibility, muscular endurance and strength shown through measurements of functional capacity and personal statement of participant.   Intervention Provide advice, education, support and counseling about physical activity/exercise needs.;Develop an individualized exercise prescription for aerobic and resistive training based on initial evaluation findings, risk stratification, comorbidities and participant's personal goals.   Expected Outcomes Achievement of increased cardiorespiratory fitness and enhanced flexibility, muscular endurance and strength shown through measurements of functional capacity and personal statement of participant.   Stress Yes   Intervention Offer individual and/or small group education and counseling on adjustment to heart disease, stress management and health-related lifestyle change. Teach and support self-help strategies.;Refer participants experiencing significant psychosocial distress to appropriate mental health specialists for further evaluation and treatment. When possible, include family members and significant others in education/counseling sessions.   Expected Outcomes Short Term:  Participant demonstrates changes in health-related behavior, relaxation and other stress management skills, ability to obtain effective social support, and compliance with psychotropic medications if prescribed.;Long Term: Emotional wellbeing is indicated by absence of clinically significant psychosocial distress or social isolation.      Core Components/Risk Factors/Patient Goals Review:      Goals and Risk Factor Review    Row Name 12/19/15 1335 12/28/15 1330 01/07/16 1504 01/22/16 0735 01/29/16 0751     Core Components/Risk Factors/Patient Goals Review   Personal Goals Review Improve shortness of breath with ADL's;Develop more efficient breathing techniques such as purse lipped breathing and diaphragmatic breathing and practicing self-pacing with activity. Sedentary;Increase Strength and Stamina Increase knowledge of respiratory medications and ability  to use respiratory devices properly.;Improve shortness of breath with ADL's;Develop more efficient breathing techniques such as purse lipped breathing and diaphragmatic breathing and practicing self-pacing with activity. Sedentary;Increase Strength and Stamina;Weight Management/Obesity Develop more efficient breathing techniques such as purse lipped breathing and diaphragmatic breathing and practicing self-pacing with activity.;Improve shortness of breath with ADL's;Increase knowledge of respiratory medications and ability to use respiratory devices properly.;Increase Strength and Stamina;Sedentary   Review Reviewed pursed lip breathing technique with pt today to use during exercise and at home. Lauralynn is riding her bike at home on off days from rehab.  She starting to notice an increase in her strength and stamina. Ms Frese has a good understanding of her MDI's. She does have problems with thrush and her Advair. Educated Ms Underhill on her MDI's and SVN being equally important, and if good technique with a spacer is performed, an MDI's can be the same  amount of medicine as a SVN. We discussed increasing her oxygen on the treadmill, so she can get her full 10 minutes accomplished with out low O2Sat's and increased shortness of breath. Even with PLB, the treadmill is a challenge. Ms Willard received a letter from the transplant team. She is required to exercise 3 days/week in [facility] and also loss 7-10lbs. She plans to meet with the dietitian one on one for help with this weight loss. Ms Gehlhausen meets with the transplant team on 02/04/16 and 02/05/16. She has worked her exercise goal on the TM to 72mns. and 1.257m. [facility]'s list rquirement for transplant lists 1.5-2.0 on the TM, so that will be her next goal. She has a good understaning of her MDI's and her shortness of breath - uses pacing , PLB, and adjusts her oxygen indepently for increased exercise.   Expected Outcomes Pt will become independent in using pursed lip breathing to improve overall shortness of breath. DeJeninill continue to attend exercise and educaiton classes to boost her functional capacity.  --  -- Work towards increasing her spend on the TM to 1.52m60m   [place] Falls[place] 03/05/16 1309 03/18/16 0926 05/20/16 0641         Core Components/Risk Factors/Patient Goals Review   Personal Goals Review Increase knowledge of respiratory medications and ability to use respiratory devices properly.;Improve shortness of breath with ADL's;Develop more efficient breathing techniques such as purse lipped breathing and diaphragmatic breathing and practicing self-pacing with activity.;Increase Strength and Stamina;Weight Management/Obesity Weight Management/Obesity;Sedentary;Increase Strength and Stamina;Improve shortness of breath with ADL's;Increase knowledge of respiratory medications and ability to use respiratory devices properly.;Develop more efficient breathing techniques such as purse lipped breathing and diaphragmatic breathing and practicing self-pacing with activity.  --     Review DebKamilahs been out  for a few weeks, since she returned to work. She has been able to clear her schedule to attend Pulmonary Rehab 3 days a week as well as maintain her work. She continues to state understanding of properuse of her MDI.s  DebTaleiantinues to work toward required weight loss and is down 7 - 8 pounds. She continues to manage her medications and uses her purse lipped breathing technique as needed.  Ms BalMccalister progressing toward her lung transplant. She weighed at 145.4lbs and her goal is under 140lbs. She has raised most of her funding and has her next meeting with the transplant team on December 6th. I gave her a spacer to use with her albuterol MDI. She has a good understanding of her MDI's and oxygen - self adjusting the flow  with increased exercise to manage her shortness of breath and meet her time goals on the exercise equipment. PLB is a part of her active life and managing her pulmonary fibrosis. Ms Seki had her appointment with [facility] 05/13/16 - final review for lung transplant. She raised her money as well, so she is currently waiting for the results of the [facility] staff meeting today on 05/20/16.      Expected Outcomes Continue compliance with medications and use of MDI's. Continue to work toard reaching the weight loss, fund raising and exercise goals required to be placed on the transplant list to see her name on the list and ready to start "[employer]".  -- Continue with plans for a lung transplant.        Core Components/Risk Factors/Patient Goals at Discharge (Final Review):      Goals and Risk Factor Review - 05/20/16 0641      Core Components/Risk Factors/Patient Goals Review   Review Ms Lavery had her appointment with [facility] 05/13/16 - final review for lung transplant. She raised her money as well, so she is currently waiting for the results of the [facility] staff meeting today on 05/20/16.    Expected Outcomes Continue with plans for a lung transplant.      ITP Comments:     ITP Comments     Row Name 12/14/15 1215 12/14/15 1216 12/14/15 1221 12/14/15 1226 01/09/16 1321   ITP Comments taking xanax when she needs it. Valkyrie reports it is stressful about the possibility of a lung transplant and all the workup.   Pulse ox down to 82% at end of 6 minute walk even on 8 liters of supplemental oxygen. Pulse ox came up to 92% with rest.  Right femerol area is sore Deb reports from her heart cath last week and her right groin area and leg are bruised. Deb reports her right shoulder hurts from the position she had to be for her ultrasound of her liver at [facility] (being evaluated for a lung transplant).  Deb wants to meet with the Registered Dietician to help her lose weight. Deb has cut out bread and sodas. Increased [place]ma from 6 to 8 liters of oxygen while on XR.  Increased Creta to 15 liters oxygen using her own nasal cannula with a reservoir while Jayna was on the treadmill.  Smrithi was able to walk at a speed of 1.2 mph with 0.5% incline on the treadmill for 10 minutes without stopping.  This is the first time Joyceann has been able to do this.     [place] Name 02/06/16 1606 03/06/16 1121 04/30/16 1344       ITP Comments Ms Vastine had a PFT at [facility] with a DLCO of 17%. She also had an ABG with PO2 of 42. 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 Grazia has not been able to attend regularly as she is back at work. Ms Givhan has emailed me. She is very busy with work, fund raising, and her up coming [place] appointment on 05/14/16..        Comments: 30 day note review

## 2016-05-29 ENCOUNTER — Ambulatory Visit: Payer: 59 | Admitting: Family Medicine

## 2016-05-30 ENCOUNTER — Ambulatory Visit: Payer: 59

## 2016-06-04 ENCOUNTER — Ambulatory Visit: Payer: 59

## 2016-06-06 ENCOUNTER — Ambulatory Visit: Payer: 59

## 2016-06-11 ENCOUNTER — Ambulatory Visit: Payer: 59

## 2016-06-11 DIAGNOSIS — J9610 Chronic respiratory failure, unspecified whether with hypoxia or hypercapnia: Secondary | ICD-10-CM | POA: Diagnosis not present

## 2016-06-11 DIAGNOSIS — J8410 Pulmonary fibrosis, unspecified: Secondary | ICD-10-CM | POA: Diagnosis not present

## 2016-06-11 DIAGNOSIS — R0902 Hypoxemia: Secondary | ICD-10-CM | POA: Diagnosis not present

## 2016-06-13 ENCOUNTER — Ambulatory Visit: Payer: 59

## 2016-06-16 ENCOUNTER — Ambulatory Visit: Payer: 59

## 2016-06-18 ENCOUNTER — Other Ambulatory Visit: Payer: Self-pay | Admitting: Pulmonary Disease

## 2016-06-18 ENCOUNTER — Ambulatory Visit: Payer: 59

## 2016-06-18 ENCOUNTER — Encounter: Payer: Self-pay | Admitting: Respiratory Therapy

## 2016-06-18 DIAGNOSIS — M20012 Mallet finger of left finger(s): Secondary | ICD-10-CM | POA: Diagnosis not present

## 2016-06-20 ENCOUNTER — Ambulatory Visit: Payer: 59

## 2016-06-23 ENCOUNTER — Encounter: Payer: Self-pay | Admitting: Respiratory Therapy

## 2016-06-23 ENCOUNTER — Ambulatory Visit: Payer: 59

## 2016-06-23 DIAGNOSIS — J8410 Pulmonary fibrosis, unspecified: Secondary | ICD-10-CM

## 2016-06-23 NOTE — Progress Notes (Signed)
Pulmonary Individual Treatment Plan  Patient Details  Name: Erika Martinez MRN: [id number] Date of Birth: [birth year] Referring Provider:   Flowsheet Row Pulmonary Rehab from 12/14/2015 in [facility] Cardiac and Pulmonary Rehab  Referring Provider  McQuaid      Initial Encounter Date:  Flowsheet Row Pulmonary Rehab from 12/14/2015 in [facility] Cardiac and Pulmonary Rehab  Date  12/14/15  Referring Provider  McQuaid      Visit Diagnosis: Pulmonary fibrosis ([place])  Patient's Home Medications on Admission:  Current Outpatient Prescriptions:    ADVAIR DISKUS 500-50 MCG/DOSE AEPB, INHALE 1 PUFF INTO THE LUNGS 2 (TWO) TIMES DAILY., Disp: 60 each, Rfl: 5   albuterol (PROVENTIL) (2.5 MG/3ML) 0.083% nebulizer solution, Take 3 mLs (2.5 mg total) by nebulization every 4 (four) hours as needed for wheezing or shortness of breath. DX Code: J84.10, Disp: 75 mL, Rfl: 12   azaTHIOprine (IMURAN) 50 MG tablet, Take 2 tablets (100 mg total) by mouth daily. Daily, Disp: 60 tablet, Rfl: 5   azaTHIOprine (IMURAN) 50 MG tablet, TAKE 2 TABLETS BY MOUTH EVERY DAY, Disp: 60 tablet, Rfl: 5   azithromycin (ZITHROMAX Z-PAK) 250 MG tablet, Take as needed, Disp: 6 each, Rfl: 0   cholecalciferol (VITAMIN D) 1000 UNITS tablet, Take 2,000 Units by mouth daily., Disp: , Rfl:    diltiazem (CARDIZEM) 60 MG tablet, TAKE 1 TABLET (60 MG TOTAL) BY MOUTH DAILY., Disp: 30 tablet, Rfl: 2   fluconazole (DIFLUCAN) 150 MG tablet, TAKE 1 TABLET WEEKLY AS NEEDED FOR THRUSH, Disp: 4 tablet, Rfl: 2   fluticasone (FLONASE) 50 MCG/ACT nasal spray, Place 2 sprays into both nostrils daily., Disp: 16 g, Rfl: 6   levofloxacin (LEVAQUIN) 500 MG tablet, Take 1 tablet (500 mg total) by mouth daily., Disp: 5 tablet, Rfl: 0   loratadine (CLARITIN) 10 MG tablet, TAKE 1 TABLET (10 MG TOTAL) BY MOUTH 2 (TWO) TIMES DAILY., Disp: 60 tablet, Rfl: 3   nystatin (MYCOSTATIN) 100000 UNIT/ML suspension, TAKE 1 TABLESPOON BY MOUTH BEFORE MEALS AND AT BEDTIME  (SWISH AND SPIT OUT), Disp: 473 mL, Rfl: 2   omeprazole (PRILOSEC) 40 MG capsule, TAKE 1 CAPSULE TWICE DAILY, Disp: 60 capsule, Rfl: 2   PEG 3350-KCl-NaBcb-NaCl-NaSulf (PEG-3350/ELECTROLYTES) 236 g SOLR, Take by mouth., Disp: , Rfl:    predniSONE (DELTASONE) 10 MG tablet, Take 0.5 tablets (5 mg total) by mouth daily with breakfast., Disp: 30 tablet, Rfl: 0   sulfamethoxazole-trimethoprim (BACTRIM DS,SEPTRA DS) 800-160 MG tablet, TAKE 1 TABLET BY MOUTH 3 (THREE) TIMES A WEEK., Disp: , Rfl: 5   sulfamethoxazole-trimethoprim (BACTRIM DS,SEPTRA DS) 800-160 MG tablet, TAKE 1 TABLET BY MOUTH 3 (THREE) TIMES A WEEK., Disp: 12 tablet, Rfl: 5   triamcinolone cream (KENALOG) 0.1 %, Apply 1 application topically 2 (two) times daily., Disp: 30 g, Rfl: 0   VENTOLIN HFA 108 (90 Base) MCG/ACT inhaler, INHALE 2 PUFFS INTO THE LUNGS EVERY 4 (FOUR) HOURS AS NEEDED FOR WHEEZING OR SHORTNESS OF BREATH., Disp: 18 g, Rfl: 5   vitamin B-12 (CYANOCOBALAMIN) 250 MCG tablet, Place 250 mcg under the tongue every other day. , Disp: , Rfl:   Past Medical History: Past Medical History:  Diagnosis Date   Abnormal hemoglobin (HCC)    Allergy    Anxiety    Asthma    Controlled insomnia    Erythrocytosis    GERD (gastroesophageal reflux disease)    Hep C w/o coma, chronic (HCC)    Hypertension    IBS (irritable bowel syndrome)    Lumbago  Mitral valve prolapse    Oxygen dependent    Plantar wart    Pulmonary fibrosis (HCC)    Scleroderma (HCC)    Dr. Pennie Banter   Seasonal allergies    Spondylolisthesis    Symptomatic menopausal or female climacteric states     Tobacco Use: History  Smoking Status   Never Smoker  Smokeless Tobacco   Never Used    Labs: Recent Review Flowsheet Data    Labs for ITP Cardiac and Pulmonary Rehab Latest Ref Rng & Units 07/14/2013 07/05/2015 08/02/2015   Cholestrol 100 - 199 mg/dL 169 - 196   LDLCALC 0 - 99 mg/dL 95 - 112(H)   HDL >39 mg/dL 58 - 68    Trlycerides 0 - 149 mg/dL 78 - 78   Hemoglobin A1c 4.8 - 5.6 % - 6.3(H) -       ADL UCSD:   Pulmonary Function Assessment:   Exercise Target Goals:    Exercise Program Goal: Individual exercise prescription set with THRR, safety & activity barriers. Participant demonstrates ability to understand and report RPE using BORG scale, to self-measure pulse accurately, and to acknowledge the importance of the exercise prescription.  Exercise Prescription Goal: Starting with aerobic activity 30 plus minutes a day, 3 days per week for initial exercise prescription. Provide home exercise prescription and guidelines that participant acknowledges understanding prior to discharge.  Activity Barriers & Risk Stratification:   6 Minute Walk:   Initial Exercise Prescription:   Perform Capillary Blood Glucose checks as needed.  Exercise Prescription Changes:     Exercise Prescription Changes    Row Name 12/26/15 1400 01/09/16 1300 01/23/16 1400 02/06/16 1300 02/21/16 1100     Exercise Review   Progression Yes Yes Yes Yes Yes     Response to Exercise   Blood Pressure (Admit) 110/60 118/64 112/66 120/66 148/78   Blood Pressure (Exercise) 126/64 138/64 150/84 124/74 112/66   Blood Pressure (Exit) 108/50 140/70 124/62 104/62 106/74   Heart Rate (Admit) 106 bpm 114 bpm 110 bpm 94 bpm 100 bpm   Heart Rate (Exercise) 115 bpm 120 bpm 118 bpm 112 bpm 106 bpm   Heart Rate (Exit) 104 bpm 100 bpm 108 bpm 100 bpm 95 bpm   Oxygen Saturation (Admit) 96 % 89 % 97 % 96 % 100 %   Oxygen Saturation (Exercise) 92 % 90 % 93 % 92 % 89 %   Oxygen Saturation (Exit) 94 % 96 % 100 % 95 % 100 %   Rating of Perceived Exertion (Exercise) '13 13 13 13 13   ' Perceived Dyspnea (Exercise) '3 3 3 3 3   ' Comments  -- Home Exercise Guidelines given 01/09/16  --  --  --     Progression   Progression Continue to progress workloads to maintain intensity without signs/symptoms of physical distress. Continue to progress  workloads to maintain intensity without signs/symptoms of physical distress. Continue to progress workloads to maintain intensity without signs/symptoms of physical distress. Continue to progress workloads to maintain intensity without signs/symptoms of physical distress. Continue to progress workloads to maintain intensity without signs/symptoms of physical distress.     Resistance Training   Training Prescription Yes Yes Yes Yes Yes   Weight 2 6  exercise O2 6-8L '3 3 3   ' Reps 10-15 10-15 10-15 10-15 10-12     Interval Training   Interval Training  --  -- No  -- No     Oxygen   Oxygen Continuous  -- Continuous  --  Continuous   Liters '4 2 8 8  15 ' L/min on TM 8     Treadmill   MPH 1.2 1.2 1.2 1.2 1.3   Grade 0 0.5 0 0 0   Minutes '10 10 12 25 20   ' METs  -- 2 1.92 1.92 2     NuStep   Level  -- 2  --  --  --   Minutes  -- 15  --  --  --   METs  -- 1.5  --  --  --     REL-XR   Level '3 3 3 4 4   ' Minutes '15 15 15 15 15   ' METs 1.9 2.1  -- 3 2.6     Biostep-RELP   Level  -- 2  --  --  --   Minutes  -- 15  --  --  --   METs  -- 2  --  --  --     Home Exercise Plan   Plans to continue exercise at  -- Home  Stationary bike  --  --  --   Frequency  -- Add 4 additional days to program exercise sessions.  --  --  --   Row Name 03/06/16 1100 03/18/16 1400 04/16/16 1600         Exercise Review   Progression Yes Yes  --       Response to Exercise   Blood Pressure (Admit)  -- 124/68  --     Blood Pressure (Exercise) 160/80 152/70 148/72     Blood Pressure (Exit) 130/80 128/72 126/54     Heart Rate (Admit) 105 bpm 102 bpm 104 bpm     Heart Rate (Exercise) 106 bpm 115 bpm 102 bpm     Heart Rate (Exit) 98 bpm 99 bpm 89 bpm     Oxygen Saturation (Admit) 100 % 93 % 95 %     Oxygen Saturation (Exercise) 97 % 86 % 94 %     Oxygen Saturation (Exit) 99 % 100 % 98 %     Rating of Perceived Exertion (Exercise) '13 13 12     ' Perceived Dyspnea (Exercise) '3 3 2     ' Symptoms  --  -- none      Comments  -- Home Exercise Guidelines given 01/09/16 Home Exercise Guidelines given 01/09/16     Duration  -- Progress to 45 minutes of aerobic exercise without signs/symptoms of physical distress Progress to 45 minutes of aerobic exercise without signs/symptoms of physical distress     Intensity  -- THRR unchanged  111-142 THRR unchanged       Progression   Progression Continue to progress workloads to maintain intensity without signs/symptoms of physical distress. Continue to progress workloads to maintain intensity without signs/symptoms of physical distress. Continue to progress workloads to maintain intensity without signs/symptoms of physical distress.     Average METs  -- 2.45 2.13       Resistance Training   Training Prescription Yes Yes Yes     Weight 3 3 right 2 left 3 right 2 left     Reps 10-12 10-12 10-12       Interval Training   Interval Training No No No       Oxygen   Oxygen Continuous Continuous Continuous     Liters '8 8 8  ' 15L on treadmill       Treadmill   MPH 1.2 1.3 1.3     Grade 0 0  0     Minutes 20 20 --  omitted at last visit     METs 1.92 2 2       NuStep   Level  --  -- 4     Minutes  --  -- 15     METs  --  -- 1.5       REL-XR   Level '5 4 3     ' Minutes '15 15 15     ' METs  -- 2.9 2.9       Home Exercise Plan   Plans to continue exercise at  -- [place] bike     Frequency  -- Add 4 additional days to program exercise sessions. Add 4 additional days to program exercise sessions.        Exercise Comments:     Exercise Comments    Row Name 01/09/16 1312 01/09/16 1356 01/23/16 1423 02/06/16 1329 02/06/16 1605   Exercise Comments Reviewed home exercise with pt today.  Pt plans to continue riding stationary bike at home  for exercise.  We discussed her progression with exercise at home to increase her current time. Reviewed THR, pulse, RPE, sign and symptoms, and when to call 911 or MD.  Also discussed weather  considerations and indoor options.  Pt voiced understanding. Erika Martinez is continuing to improve her intensity and duration of exercise. Erika Martinez continues to progress well with exercise. Erika Martinez is progressing well with exercise.  She has added resistnace on the XR and more time to TM. Erika Martinez had an oxygen titration [facility] for her Lung Transplant surgery, so we will not perform a Mid37md   Row Name 02/21/16 1152 03/06/16 1119 03/18/16 1452 04/02/16 1551 04/16/16 1614   Exercise Comments DPaulhas progressed well with exercise. Erika Martinez not been able to attend regularly as she is back at work. Erika Martinez trying to find a routine with her work schedule to allow her to get to rehab.  She continues to make improvements when she is here.  We will continue to monitor her progression. Erika Martinez been out with work since last review. Erika Martinez had one visit since last review.  We will continue to monitor her progression.   Row Name 04/29/16 1550 05/28/16 1529         Exercise Comments Erika Martinez not been in since last review.  No progression made. Erika Martinez not attended since last review.         Discharge Exercise Prescription (Final Exercise Prescription Changes):     Exercise Prescription Changes - 04/16/16 1600      Response to Exercise   Blood Pressure (Exercise) 148/72   Blood Pressure (Exit) 126/54   Heart Rate (Admit) 104 bpm   Heart Rate (Exercise) 102 bpm   Heart Rate (Exit) 89 bpm   Oxygen Saturation (Admit) 95 %   Oxygen Saturation (Exercise) 94 %   Oxygen Saturation (Exit) 98 %   Rating of Perceived Exertion (Exercise) 12   Perceived Dyspnea (Exercise) 2   Symptoms none   Comments Home Exercise Guidelines given 01/09/16   Duration Progress to 45 minutes of aerobic exercise without signs/symptoms of physical distress   Intensity THRR unchanged     Progression   Progression Continue to progress workloads to maintain intensity without signs/symptoms of physical distress.   Average  METs 2.13     Resistance Training   Training Prescription Yes   Weight 3 right 2  left   Reps 10-12     Interval Training   Interval Training No     Oxygen   Oxygen Continuous   Liters 8  15L on treadmill     Treadmill   MPH 1.3   Grade 0   Minutes --  omitted at last visit   METs 2     NuStep   Level 4   Minutes 15   METs 1.5     REL-XR   Level 3   Minutes 15   METs 2.9     Home Exercise Plan   Plans to continue exercise at Home  Stationary bike   Frequency Add 4 additional days to program exercise sessions.       Nutrition:  Target Goals: Understanding of nutrition guidelines, daily intake of sodium <159m, cholesterol <2076m calories 30% from fat and 7% or less from saturated fats, daily to have 5 or more servings of fruits and vegetables.  Biometrics:    Nutrition Therapy Plan and Nutrition Goals:   Nutrition Discharge: Rate Your Plate Scores:   Psychosocial: Target Goals: Acknowledge presence or absence of depression, maximize coping skills, provide positive support system. Participant is able to verbalize types and ability to use techniques and skills needed for reducing stress and depression.  Initial Review & Psychosocial Screening:   Quality of Life Scores:   PHQ-9: Recent Review Flowsheet Data    [facility] 03/26/2016 12/14/2015 10/03/2015 07/03/2015 04/06/2015   Decreased Interest 0 1 0 0 0   Down, Depressed, Hopeless 0 1 0 0 0   PHQ - 2 Score 0 2 0 0 0   Altered sleeping - 1 - - -   Tired, decreased energy - 2 - - -   Change in appetite - 2 - - -   Feeling bad or failure about yourself  - 1 - - -   Trouble concentrating - 0 - - -   Moving slowly or fidgety/restless - 0 - - -   Suicidal thoughts - 0 - - -   PHQ-9 Score - 8 - - -   Difficult doing work/chores - Somewhat difficult - - -      Psychosocial Evaluation and Intervention:     Psychosocial Evaluation - 01/07/16 1231      Psychosocial Evaluation &  Interventions   Comments Counselor met with Erika. Martinez as she has reutrned to this program.   She is [age]ears old and reports she is in the lung transplant process to see if she will be chosen.  Erika. BaLoudinas Pulmonary Fibrosis.  She has a strong support system with her sister and a daughter who live close by.  She is actively involved in her local church community.  Erika. Martinez she sleeps "okay" with some nights needing to take a prescribed medication to help with her anxiety.  She states her appetite is good most of the time and she denies a history of depression, but some anxiety which is being treated.  Erika. 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  Erika. BaRoyaloals for this program are to increase her stamina and strength.  Counselor will continue to follow with her during the course of this program.         Psychosocial Re-Evaluation:     Psychosocial Re-Evaluation    RoVandiverame 01/21/16  1655 03/05/16 1309 03/19/16 1027 05/20/16 0644       Psychosocial Re-Evaluation   Comments Counselor follow up with Erika. Bobbye Charleston today.  She reports doing well and enjoying this program; including experiencing benefits of increased stamina.  She reports being approved for the lung transplant list and has had to go through multiple tests and meet extensive criteria for this to occur.  Erika. Kinnick reports this is stressful for her currently as well as school starting back in a few weeks.  Erika. Meiser reports sleeping "okay" currently and hopes that will continue, although she has to wean off her medications that have helped her sleep PRN.  Counselor will continue to follow with Erika. Cutter throughout the course of this program.  Patient psychosocial assessment reveals no barriers to participation in Pulmonary Rehab. Psychosocial areas that are currently affecting patient's rehab  experience include concerns about , financial resources,   And Work. Patient does continue to exhibit positive  coping skills to deal with her psychosocial concerns. Offered emotional support and reassurance. Patient does feel she is making progress toward Pulmonary Rehab goals. Patient reports her health and activity level has improved in the past 30 days as evidenced by patient's report of increased ability to manage her daily activities and return to work for an eight hour day..  Patient reports  feeling positive about current and projected progression in Pulmonary Rehab. After reviewing the patient's treatment plan, the patient is making progress toward Pulmonary Rehab goals. Patient's rate of progress toward rehab goals is good. Plan of action to help patient continue to work towards rehab goals include expecting her to be able to attend program 3 days a week even though she has returned to work. Watching progress towards her ability to be placed on the transplant list. Annastacia has raised almost 9 thousand dollars, has stopped taking the Xanax and is close to her weight loss goal required to be placed on the list.  Will continue to monitor and evaluate progress toward psychosocial goal(s). Counselor follow up with Erika. Wetherell on this date reporting dealing with a great deal of stress in regards to being on the transplant list.  Although she has raised a great deal of the funds required, there is still some to go; she also has had to stop taking her Xanax which has been helpful for dealing with stress and anxiety as well as sleep.  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  Erika. 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  She continues to have a positive outlook and reports her faith and exercise and leaning on  friends help her cope with all that she is currently going through.  Counselor encouraged her to continue to practice her stress management skills and offered support and encouragement for all the progress made so far in this process.   Erika Dionisio Paschal has been in contact with me through email. She is anxious for the results of her final [facility] meeting and the closeness of the lung transplant surgery. She was very stressed with the required fundraising for the surgery, but with the help of her family and church, she raised the money. Erika Paulding will have great support with the surgery.      Education: Education Goals: Education classes will  be provided on a weekly basis, covering required topics. Participant will state understanding/return demonstration of topics presented.  Learning Barriers/Preferences:   Education Topics: Initial Evaluation Education: - Verbal, written and demonstration of respiratory meds, RPE/PD scales, oximetry and breathing techniques. Instruction on use of nebulizers and MDIs: cleaning and proper use, rinsing mouth with steroid doses and importance of monitoring MDI activations. Flowsheet Row Documentation from 03/14/2015 in [place]  Date  12/05/14  Educator  LB  Instruction Review Code  2- meets goals/outcomes      General Nutrition Guidelines/Fats and Fiber: -Group instruction provided by verbal, written material, models and posters to present the general guidelines for heart healthy nutrition. Gives an explanation and review of dietary fats and fiber. Flowsheet Row Pulmonary Rehab from 02/01/2016 in [facility] Cardiac and Pulmonary Rehab  Date  12/31/15  Educator  CR  Instruction Review Code  2- meets goals/outcomes      Controlling Sodium/Reading Food Labels: -Group verbal and written material supporting the discussion of sodium use in heart healthy nutrition. Review and explanation with models, verbal and written materials for  utilization of the food label. Flowsheet Row Pulmonary Rehab from 02/01/2016 in [facility] Cardiac and Pulmonary Rehab  Date  01/07/16  Educator  CR  Instruction Review Code  2- meets goals/outcomes      Exercise Physiology & Risk Factors: - Group verbal and written instruction with models to review the exercise physiology of the cardiovascular system and associated critical values. Details cardiovascular disease risk factors and the goals associated with each risk factor. Flowsheet Row Pulmonary Rehab from 02/01/2016 in [facility] Cardiac and Pulmonary Rehab  Date  12/26/15  Educator  Alberteen Sam and Nada Maclachlan   Instruction Review Code  2- meets goals/outcomes      Aerobic Exercise & Resistance Training: - Gives group verbal and written discussion on the health impact of inactivity. On the components of aerobic and resistive training programs and the benefits of this training and how to safely progress through these programs.   Flexibility, Balance, General Exercise Guidelines: - Provides group verbal and written instruction on the benefits of flexibility and balance training programs. Provides general exercise guidelines with specific guidelines to those with heart or lung disease. Demonstration and skill practice provided.   Stress Management: - Provides group verbal and written instruction about the health risks of elevated stress, cause of high stress, and healthy ways to reduce stress. Flowsheet Row Pulmonary Rehab from 02/01/2016 in [facility] Cardiac and Pulmonary Rehab  Date  12/19/15  Educator  Kathreen Cornfield  Instruction Review Code  2- meets goals/outcomes      Depression: - Provides group verbal and written instruction on the correlation between heart/lung disease and depressed mood, treatment options, and the stigmas associated with seeking treatment.   Exercise & Equipment Safety: - Individual verbal instruction and demonstration of equipment use and safety with use of the  equipment. Flowsheet Row Pulmonary Rehab from 02/01/2016 in [facility] Cardiac and Pulmonary Rehab  Date  12/14/15  Educator  C. EnterkinRN  Instruction Review Code  1- partially meets, needs review/practice      Infection Prevention: - Provides verbal and written material to individual with discussion of infection control including proper hand washing and proper equipment cleaning during exercise session. Flowsheet Row Pulmonary Rehab from 02/01/2016 in [facility] Cardiac and Pulmonary Rehab  Date  12/14/15  Educator  C. EnterkinRN  Instruction Review Code  2- meets goals/outcomes      Falls Prevention: -  Provides verbal and written material to individual with discussion of falls prevention and safety. Flowsheet Row Pulmonary Rehab from 02/01/2016 in [facility] Cardiac and Pulmonary Rehab  Date  12/14/15  Educator  C. Boardman  Instruction Review Code  2- meets goals/outcomes      Diabetes: - Individual verbal and written instruction to review signs/symptoms of diabetes, desired ranges of glucose level fasting, after meals and with exercise. Advice that pre and post exercise glucose checks will be done for 3 sessions at entry of program.   Chronic Lung Diseases: - Group verbal and written instruction to review new updates, new respiratory medications, new advancements in procedures and treatments. Provide informative websites and "800" numbers of self-education. Flowsheet Row Pulmonary Rehab from 02/01/2016 in [facility] Cardiac and Pulmonary Rehab  Date  12/24/15  Educator  LB  Instruction Review Code  2- meets goals/outcomes      Lung Procedures: - Group verbal and written instruction to describe testing methods done to diagnose lung disease. Review the outcome of test results. Describe the treatment choices: Pulmonary Function Tests, ABGs and oximetry. Flowsheet Row Documentation from 03/14/2015 in [place]  Date  01/12/15  Educator  sj  Instruction Review  Code  2- meets goals/outcomes      Energy Conservation: - Provide group verbal and written instruction for methods to conserve energy, plan and organize activities. Instruct on pacing techniques, use of adaptive equipment and posture/positioning to relieve shortness of breath. Flowsheet Row Documentation from 03/14/2015 in [place]  Date  01/24/15  Educator  SW  Instruction Review Code  2- meets goals/outcomes      Triggers: - Group verbal and written instruction to review types of environmental controls: home humidity, furnaces, filters, dust mite/pet prevention, HEPA vacuums. To discuss weather changes, air quality and the benefits of nasal washing. Flowsheet Row Documentation from 03/14/2015 in [place]  Date  12/18/14  Educator  LB  Instruction Review Code  2- meets goals/outcomes      Exacerbations: - Group verbal and written instruction to provide: warning signs, infection symptoms, calling MD promptly, preventive modes, and value of vaccinations. Review: effective airway clearance, coughing and/or vibration techniques. Create an Sports administrator. Flowsheet Row Pulmonary Rehab from 02/01/2016 in [facility] Cardiac and Pulmonary Rehab  Date  01/21/16  Educator  LB  Instruction Review Code  2- meets goals/outcomes      Oxygen: - Individual and group verbal and written instruction on oxygen therapy. Includes supplement oxygen, available portable oxygen systems, continuous and intermittent flow rates, oxygen safety, concentrators, and Medicare reimbursement for oxygen. Flowsheet Row Documentation from 03/14/2015 in [place]  Date  12/05/14  Educator  LB  Instruction Review Code  2- meets goals/outcomes      Respiratory Medications: - Group verbal and written instruction to review medications for lung disease. Drug class, frequency, complications, importance of spacers,  rinsing mouth after steroid MDI's, and proper cleaning methods for nebulizers. Flowsheet Row Documentation from 03/14/2015 in [place]  Date  12/22/14  Educator  [place]  Instruction Review Code  2- meets goals/outcomes [Spacer and instructions given to pt. ]      AED/CPR: - Group verbal and written instruction with the use of models to demonstrate the basic use of the AED with the basic ABC's of resuscitation. Flowsheet Row Pulmonary Rehab from 02/01/2016 in [facility] Cardiac and Pulmonary Rehab  Date  01/11/16  Educator  CE  Instruction Review Code  2- meets goals/outcomes      Breathing Retraining: - Provides individuals verbal and written instruction on purpose, frequency, and proper technique of diaphragmatic breathing and pursed-lipped breathing. Applies individual practice skills. Flowsheet Row Pulmonary Rehab from 02/01/2016 in [facility] Cardiac and Pulmonary Rehab  Date  12/19/15  Educator  [facility]  Instruction Review Code  2- meets goals/outcomes      Anatomy and Physiology of the Lungs: - Group verbal and written instruction with the use of models to provide basic lung anatomy and physiology related to function, structure and complications of lung disease. Flowsheet Row Pulmonary Rehab from 02/01/2016 in [facility] Cardiac and Pulmonary Rehab  Date  02/01/16  Educator  [place]  Instruction Review Code  2- meets goals/outcomes      Heart Failure: - Group verbal and written instruction on the basics of heart failure: signs/symptoms, treatments, explanation of ejection fraction, enlarged heart and cardiomyopathy. Flowsheet Row Pulmonary Rehab from 02/01/2016 in [facility] Cardiac and Pulmonary Rehab  Date  01/25/16  Educator  CE  Instruction Review Code  2- meets goals/outcomes      Sleep Apnea: - Individual verbal and written instruction to review Obstructive Sleep Apnea. Review of risk factors, methods for diagnosing and types of masks and machines for  OSA.   Anxiety: - Provides group, verbal and written instruction on the correlation between heart/lung disease and anxiety, treatment options, and management of anxiety. Flowsheet Row Documentation from 03/14/2015 in [place]  Date  01/31/15  Educator  [facility]  Instruction Review Code  2- Meets goals/outcomes      Relaxation: - Provides group, verbal and written instruction about the benefits of relaxation for patients with heart/lung disease. Also provides patients with examples of relaxation techniques. Flowsheet Row Pulmonary Rehab from 02/01/2016 in [facility] Cardiac and Pulmonary Rehab  Date  01/16/16  Educator  Kathreen Cornfield, [facility]  Instruction Review Code  2- Meets goals/outcomes      Knowledge Questionnaire Score:    Core Components/Risk Factors/Patient Goals at Admission:   Core Components/Risk Factors/Patient Goals Review:      Goals and Risk Factor Review    Row Name 12/28/15 1330 01/07/16 1504 01/22/16 0735 01/29/16 0751 03/05/16 1309     Core Components/Risk Factors/Patient Goals Review   Personal Goals Review Sedentary;Increase Strength and Stamina Increase knowledge of respiratory medications and ability to use respiratory devices properly.;Improve shortness of breath with ADL's;Develop more efficient breathing techniques such as purse lipped breathing and diaphragmatic breathing and practicing self-pacing with activity. Sedentary;Increase Strength and Stamina;Weight Management/Obesity Develop more efficient breathing techniques such as purse lipped breathing and diaphragmatic breathing and practicing self-pacing with activity.;Improve shortness of breath with ADL's;Increase knowledge of respiratory medications and ability to use respiratory devices properly.;Increase Strength and Stamina;Sedentary Increase knowledge of respiratory medications and ability to use respiratory devices properly.;Improve shortness of breath with ADL's;Develop more  efficient breathing techniques such as purse lipped breathing and diaphragmatic breathing and practicing self-pacing with activity.;Increase Strength and Stamina;Weight Management/Obesity   Review Erika Martinez is riding her bike at home on off days from rehab.  She starting to notice an increase in her strength and stamina. Erika Biggar has a good understanding of her MDI's. She does have problems with thrush and her Advair. Educated Erika Coke on her MDI's and SVN being equally important, and if good technique with a spacer is performed, an MDI's can be the same amount of medicine as a SVN.  We discussed increasing her oxygen on the treadmill, so she can get her full 10 minutes accomplished with out low O2Sat's and increased shortness of breath. Even with PLB, the treadmill is a challenge. Erika Belvin received a letter from the transplant team. She is required to exercise 3 days/week in [facility] and also loss 7-10lbs. She plans to meet with the dietitian one on one for help with this weight loss. Erika Conerly meets with the transplant team on 02/04/16 and 02/05/16. She has worked her exercise goal on the TM to 8mns. and 1.254m. [facility]'s list rquirement for transplant lists 1.5-2.0 on the TM, so that will be her next goal. She has a good understaning of her MDI's and her shortness of breath - uses pacing , PLB, and adjusts her oxygen indepently for increased exercise. DeIliyanaas been out for a few weeks, since she returned to work. She has been able to clear her schedule to attend Pulmonary Rehab 3 days a week as well as maintain her work. She continues to state understanding of properuse of her MDI.s  DeLamekaontinues to work toward required weight loss and is down 7 - 8 pounds. She continues to manage her medications and uses her purse lipped breathing technique as needed.    Expected Outcomes DeMarielizill continue to attend exercise and educaiton classes to boost her functional capacity.  --  -- Work towards increasing her  spend on the TM to 1.30m62m Continue compliance with medications and use of MDI's. Continue to work toard reaching the weight loss, fund raising and exercise goals required to be placed on the transplant list to see her name on the list and ready to start "[employer]  RowStantonme 03/18/16 [id number]/12/17 0641           Core Components/Risk Factors/Patient Goals Review   Personal Goals Review Weight Management/Obesity;Sedentary;Increase Strength and Stamina;Improve shortness of breath with ADL's;Increase knowledge of respiratory medications and ability to use respiratory devices properly.;Develop more efficient breathing techniques such as purse lipped breathing and diaphragmatic breathing and practicing self-pacing with activity.  --      Review Erika BalBattiste progressing toward her lung transplant. She weighed at 145.4lbs and her goal is under 140lbs. She has raised most of her funding and has her next meeting with the transplant team on December 6th. I gave her a spacer to use with her albuterol MDI. She has a good understanding of her MDI's and oxygen - self adjusting the flow with increased exercise to manage her shortness of breath and meet her time goals on the exercise equipment. PLB is a part of her active life and managing her pulmonary fibrosis. Erika BalPavoned her appointment with [facility] 05/13/16 - final review for lung transplant. She raised her money as well, so she is currently waiting for the results of the [place] meeting today on 05/20/16.       Expected Outcomes  -- Continue with plans for a lung transplant.         Core Components/Risk Factors/Patient Goals at Discharge (Final Review):      Goals and Risk Factor Review - 05/20/16 0641      Core Components/Risk Factors/Patient Goals Review   Review Erika BalLekasd her [place] with [place] - final review for lung transplant. She raised her money as well, so she is currently waiting for the results of the [place] meeting today  on 05/20/16.    Expected Outcomes Continue with plans for  a lung transplant.      ITP Comments:     ITP Comments    Row Name 01/09/16 1321 02/06/16 1606 03/06/16 1121 04/30/16 1344 06/18/16 0925   ITP Comments Increased Sunni from 6 to 8 liters of oxygen while on XR.  Increased Monie to 15 liters oxygen using her own nasal cannula with a reservoir while Phylicia was on the treadmill.  Daphnie was able to walk at a speed of 1.2 mph with 0.5% incline on the treadmill for 10 minutes without stopping.  This is the first time Ellenor has been able to do this.   Erika Gethers had a PFT at [facility] with a DLCO of 17%. She also had an ABG with PO2 of 42. 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 Nariyah has not been able to attend regularly as she is back at work. Erika Amadi has emailed me. She is very busy with work, fund raising, and her up coming [facility] appointment on 05/14/16.. Erika Bufkin emailed me for an update -  she will hear again from [facility] on 06/24/16 about her lung transplant procedure. She has been busy at work and did not get out with the cold weather.      Comments: 30 day note review

## 2016-06-24 ENCOUNTER — Encounter: Payer: Self-pay | Admitting: *Deleted

## 2016-06-24 DIAGNOSIS — R262 Difficulty in walking, not elsewhere classified: Secondary | ICD-10-CM | POA: Diagnosis not present

## 2016-06-24 DIAGNOSIS — J8410 Pulmonary fibrosis, unspecified: Secondary | ICD-10-CM

## 2016-06-25 ENCOUNTER — Ambulatory Visit: Payer: 59

## 2016-06-27 ENCOUNTER — Telehealth: Payer: Self-pay | Admitting: Pulmonary Disease

## 2016-06-27 ENCOUNTER — Other Ambulatory Visit: Payer: Self-pay | Admitting: Pulmonary Disease

## 2016-06-27 ENCOUNTER — Ambulatory Visit: Payer: 59

## 2016-06-27 ENCOUNTER — Encounter: Payer: Self-pay | Admitting: Family Medicine

## 2016-06-27 ENCOUNTER — Ambulatory Visit (INDEPENDENT_AMBULATORY_CARE_PROVIDER_SITE_OTHER): Payer: 59 | Admitting: Family Medicine

## 2016-06-27 VITALS — BP 120/64 | HR 97 | Temp 97.6°F | Resp 18 | Ht 61.0 in | Wt 135.0 lb

## 2016-06-27 DIAGNOSIS — M349 Systemic sclerosis, unspecified: Secondary | ICD-10-CM | POA: Diagnosis not present

## 2016-06-27 DIAGNOSIS — M20012 Mallet finger of left finger(s): Secondary | ICD-10-CM | POA: Diagnosis not present

## 2016-06-27 DIAGNOSIS — J8410 Pulmonary fibrosis, unspecified: Secondary | ICD-10-CM

## 2016-06-27 DIAGNOSIS — D899 Disorder involving the immune mechanism, unspecified: Secondary | ICD-10-CM

## 2016-06-27 DIAGNOSIS — F320 Major depressive disorder, single episode, mild: Secondary | ICD-10-CM

## 2016-06-27 DIAGNOSIS — J9611 Chronic respiratory failure with hypoxia: Secondary | ICD-10-CM

## 2016-06-27 DIAGNOSIS — Z7682 Awaiting organ transplant status: Secondary | ICD-10-CM

## 2016-06-27 DIAGNOSIS — D849 Immunodeficiency, unspecified: Secondary | ICD-10-CM

## 2016-06-27 DIAGNOSIS — I10 Essential (primary) hypertension: Secondary | ICD-10-CM | POA: Diagnosis not present

## 2016-06-27 MED ORDER — DILTIAZEM HCL 60 MG PO TABS: ORAL_TABLET | ORAL | 2 refills | Status: DC

## 2016-06-27 MED ORDER — PREDNISONE 10 MG PO TABS: 5.0000 mg | ORAL_TABLET | Freq: Every day | ORAL | 5 refills | Status: DC

## 2016-06-27 NOTE — Progress Notes (Signed)
Name: Erika Martinez   MRN: [id number]    DOB: 05-01-1956   Date:06/27/2016       Progress Note  Subjective  Chief Complaint  Chief Complaint  Patient presents with  . Medication Refill    3 month F/U  . Hypertension    Denies any symptoms  . Allergic Rhinitis     Patient states she has a tenderness on the left side from coughing so much, runny nose from outside cold  . Insomnia    Patient states it depends on the day with her sleep patterns.   . Depression    Well controlled    HPI  Pulmonary fibrosis: she was diagnosed in 09/2013 secondary to scleroderma, she see pulmonologist, Dr. Lake Bells. She was denied lung transplant  last year because she was HCV positive, she has been treated with Havoni and in June 2016 titer was negative, and repeat titer negative 05/2015. She does not have liver cirrhosis. She is on oxygen 4-5 liters, during exercise goes up to 8-10 , she is now on Imuran, and bactrin three times weekly ( but sometimes because it causes upset stomach - explained that she needs to discuss that with dr. [place] Bells), on Prednisone  5 mg daily since 02/2016 . She has been approved for the lung transplant, she is down to the goal weight of below 140 lbs, she already fund raised  $15.000 . She will start lung rehab at [facility] per lung transplant protocol on Feb 1st, 2018.   Major Depression: she has episodes of fatigue, she has episodes of anhedonia, she still able to care for herself, takes care of her house ( what she can physically do), she has occasional crying spells ( mostly because of her daughter's dog that has cancer) She is still worried about the transplant but feeling more confident, and more educated. 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 No suicidal thoughts or ideation   HTN: taking cardizem, no recent episodes of palpitation. No chest pain , bp has been at goal. No side effects of medication   Insomnia: she states  she has difficulty staying asleep a couple of times a week. She states she tends to worry more at night.    Patient Active Problem List   Diagnosis Date Noted  . Raynaud disease 11/26/2015  . Awaiting organ transplant status 08/29/2015  . Decreased GFR 07/24/2015  . Mild major depression ([place]) 06/07/2015  . Hypertension, benign 04/06/2015  . History of hepatitis C 02/02/2015  . Insomnia 02/02/2015  . Gastro-esophageal reflux disease without esophagitis 02/02/2015  . IBS (irritable bowel syndrome) 02/02/2015  . Low serum cobalamin 02/02/2015  . Dependence on supplemental oxygen 02/02/2015  . Perennial allergic rhinitis with seasonal variation 02/02/2015  . Scleroderma ([place]) 02/02/2015  . SPL (spondylolisthesis) 02/02/2015  . Supraventricular tachycardia ([place]) 02/02/2015  . Vitamin D deficiency 02/02/2015  . Anxiety 02/02/2015  . History of pneumococcal pneumonia 09/13/2013  . Chronic hypoxemic respiratory failure ([place]) 09/13/2013  . Polycythemia, secondary 09/13/2013  . Postinflammatory pulmonary fibrosis ([place]) 09/13/2013    Past Surgical History:  Procedure Laterality Date  . ABDOMINAL HYSTERECTOMY    . VAGINAL HYSTERECTOMY      Family History  Problem Relation Age of Onset  . Lung cancer Father   . Cancer Father   . Alzheimer's disease Mother   . Breast cancer Neg Hx     Social History   Social History  .  Marital status: Divorced    Spouse name: N/A  . Number of children: N/A  . Years of education: N/A   Occupational History  . Not on file.   Social History Main Topics  . Smoking status: Never Smoker  . Smokeless tobacco: Never Used  . Alcohol use 2.0 oz/week    4 Standard drinks or equivalent per week     Comment: "social drinker"  . Drug use: No  . Sexual activity: Not on file   Other Topics Concern  . Not on file   Social History Narrative  . No narrative on file     Current Outpatient Prescriptions:  .  ADVAIR DISKUS 500-50 MCG/DOSE AEPB,  INHALE 1 PUFF INTO THE LUNGS 2 (TWO) TIMES DAILY., Disp: 60 each, Rfl: 5 .  albuterol (PROVENTIL) (2.5 MG/3ML) 0.083% nebulizer solution, Take 3 mLs (2.5 mg total) by nebulization every 4 (four) hours as needed for wheezing or shortness of breath. DX Code: J84.10, Disp: 75 mL, Rfl: 12 .  azaTHIOprine (IMURAN) 50 MG tablet, Take 2 tablets (100 mg total) by mouth daily. Daily, Disp: 60 tablet, Rfl: 5 .  cholecalciferol (VITAMIN D) 1000 UNITS tablet, Take 2,000 Units by mouth daily., Disp: , Rfl:  .  diltiazem (CARDIZEM) 60 MG tablet, TAKE 1 TABLET (60 MG TOTAL) BY MOUTH DAILY., Disp: 30 tablet, Rfl: 2 .  fluconazole (DIFLUCAN) 150 MG tablet, TAKE 1 TABLET WEEKLY AS NEEDED FOR THRUSH, Disp: 4 tablet, Rfl: 2 .  fluticasone (FLONASE) 50 MCG/ACT nasal spray, Place 2 sprays into both nostrils daily., Disp: 16 g, Rfl: 6 .  gentamicin ointment (GARAMYCIN) 0.1 %, APPLY TO NOSE THREE TIMES DAILY, Disp: , Rfl: 15 .  loratadine (CLARITIN) 10 MG tablet, TAKE 1 TABLET (10 MG TOTAL) BY MOUTH 2 (TWO) TIMES DAILY., Disp: 60 tablet, Rfl: 3 .  nystatin (MYCOSTATIN) 100000 UNIT/ML suspension, TAKE 1 TABLESPOON BY MOUTH BEFORE MEALS AND AT BEDTIME (SWISH AND SPIT OUT), Disp: 473 mL, Rfl: 2 .  omeprazole (PRILOSEC) 40 MG capsule, TAKE 1 CAPSULE TWICE DAILY, Disp: 60 capsule, Rfl: 2 .  predniSONE (DELTASONE) 10 MG tablet, Take 0.5 tablets (5 mg total) by mouth daily with breakfast., Disp: 30 tablet, Rfl: 5 .  sulfamethoxazole-trimethoprim (BACTRIM DS,SEPTRA DS) 800-160 MG tablet, TAKE 1 TABLET BY MOUTH 3 (THREE) TIMES A WEEK., Disp: , Rfl: 5 .  triamcinolone cream (KENALOG) 0.1 %, Apply 1 application topically 2 (two) times daily., Disp: 30 g, Rfl: 0 .  VENTOLIN HFA 108 (90 Base) MCG/ACT inhaler, INHALE 2 PUFFS INTO THE LUNGS EVERY 4 (FOUR) HOURS AS NEEDED FOR WHEEZING OR SHORTNESS OF BREATH., Disp: 18 g, Rfl: 5 .  vitamin B-12 (CYANOCOBALAMIN) 250 MCG tablet, Place 250 mcg under the tongue every other day. , Disp: , Rfl:   .  PEG 3350-KCl-NaBcb-NaCl-NaSulf (PEG-3350/ELECTROLYTES) 236 g SOLR, Take by mouth., Disp: , Rfl:   No Known Allergies   ROS  Constitutional: Negative for fever, positive for weight change.  Respiratory: Positive  for cough and shortness of breath.   Cardiovascular: Negative for chest pain or palpitations.  Gastrointestinal: Negative for abdominal pain, no bowel changes.  Musculoskeletal: Negative for gait problem or joint swelling.  Skin: Negative for rash.  Neurological: Negative for dizziness or headache.  No other specific complaints in a complete review of systems (except as listed in HPI above).  Objective  Vitals:   06/27/16 1403  BP: 120/64  Pulse: 97  Resp: 18  Temp: 97.6 F (36.4 C)  TempSrc:  Oral  SpO2: 98%  Weight: 135 lb (61.2 kg)  Height: 5\' 1"  (1.549 m)    Body mass index is 25.51 kg/m.  Physical Exam  Constitutional: Patient appears well-developed and well-nourished.  No distress.  HEENT: head atraumatic, normocephalic, pupils equal and reactive to light, neck supple, throat within normal limits, nasal cannula Cardiovascular: Normal rate, regular rhythm and normal heart sounds.  No murmur heard. No BLE edema. Pulmonary/Chest: Effort normal and breath sounds normal . No SOB when talking Abdominal: Soft.  There is no tenderness. Psychiatric: Patient has a normal mood and affect. behavior is normal. Judgment and thought content normal.  PHQ2/9: Depression screen [facility] 2/9 06/27/2016 03/26/2016 12/14/2015 10/03/2015 07/03/2015  Decreased Interest 0 0 1 0 0  Down, Depressed, Hopeless 0 0 1 0 0  PHQ - 2 Score 0 0 2 0 0  Altered sleeping - - 1 - -  Tired, decreased energy - - 2 - -  Change in appetite - - 2 - -  Feeling bad or failure about yourself  - - 1 - -  Trouble concentrating - - 0 - -  Moving slowly or fidgety/restless - - 0 - -  Suicidal thoughts - - 0 - -  PHQ-9 Score - - 8 - -  Difficult doing work/chores - - Somewhat difficult - -     Fall  Risk: Fall Risk  06/27/2016 03/26/2016 12/14/2015 10/03/2015 07/03/2015  Falls in the past year? No No No No No     Functional Status Survey: Is the patient deaf or have difficulty hearing?: No Does the patient have difficulty seeing, even when wearing glasses/contacts?: No Does the patient have difficulty concentrating, remembering, or making decisions?: No Does the patient have difficulty walking or climbing stairs?: No Does the patient have difficulty dressing or bathing?: No Does the patient have difficulty doing errands alone such as visiting a doctor's office or shopping?: No    Assessment & Plan  1. Awaiting organ transplant status  Lung transplant - going to [place], starting pulmonary rehab Feb 1st, 2018  2. Pulmonary fibrosis (HCC)  On nasal cannula oxygen, and taking medications   3. Immunosuppression ([place])  On Imuran ,wears a surgical mask to avoid URI and is doing well   4. Chronic hypoxemic respiratory failure (HCC)  On nasal cannula oxygen  5. Mild major depression ([place])  Worried about her daughter's dog that was diagnosed with osteosarcoma this past December. Still scared about the lung transplant but trying to be thankful about the chance of getting better - also more educated about it.   6. Scleroderma (HCC)  Stable  7. Hypertension, benign  - diltiazem (CARDIZEM) 60 MG tablet; TAKE 1 TABLET (60 MG TOTAL) BY MOUTH DAILY.  Dispense: 30 tablet; Refill: 2   8. Mallet finger, left  Seen by Sampson Si, trying to wear the finger brace but is coming off We applied ace bandage during her visit

## 2016-06-27 NOTE — Telephone Encounter (Signed)
Spoke with pt. She is needing a refill on prednisone. Rx has been sent in. Nothing further was needed. 

## 2016-06-29 DIAGNOSIS — J9610 Chronic respiratory failure, unspecified whether with hypoxia or hypercapnia: Secondary | ICD-10-CM | POA: Diagnosis not present

## 2016-06-29 DIAGNOSIS — R0902 Hypoxemia: Secondary | ICD-10-CM | POA: Diagnosis not present

## 2016-06-29 DIAGNOSIS — J8410 Pulmonary fibrosis, unspecified: Secondary | ICD-10-CM | POA: Diagnosis not present

## 2016-06-30 ENCOUNTER — Ambulatory Visit: Payer: 59

## 2016-07-01 ENCOUNTER — Telehealth: Payer: Self-pay | Admitting: Family Medicine

## 2016-07-01 NOTE — Telephone Encounter (Signed)
I will check with [facility] OB/GYN, since the note dated 10/27/2005 (Dr. Malachi Paradise) stated that she should have a total abdominal hysterectomy

## 2016-07-01 NOTE — Telephone Encounter (Signed)
Pt is requesting a return call. Pt knows that she has had a hysterectomy but need to know if she has a cervix. I did inform patient that she may have to contact the hospital or contact the doctor that performed it but she could not remember which doctor did it because it was 20 yrs ago.

## 2016-07-01 NOTE — Telephone Encounter (Signed)
Please check the old records, I should have a note on her CPE

## 2016-07-02 ENCOUNTER — Ambulatory Visit: Payer: 59

## 2016-07-04 ENCOUNTER — Ambulatory Visit: Payer: 59

## 2016-07-07 ENCOUNTER — Ambulatory Visit: Payer: 59

## 2016-07-09 ENCOUNTER — Ambulatory Visit: Payer: 59

## 2016-07-11 ENCOUNTER — Ambulatory Visit: Payer: 59 | Admitting: *Deleted

## 2016-07-12 DIAGNOSIS — J9610 Chronic respiratory failure, unspecified whether with hypoxia or hypercapnia: Secondary | ICD-10-CM | POA: Diagnosis not present

## 2016-07-12 DIAGNOSIS — J8410 Pulmonary fibrosis, unspecified: Secondary | ICD-10-CM | POA: Diagnosis not present

## 2016-07-12 DIAGNOSIS — R0902 Hypoxemia: Secondary | ICD-10-CM | POA: Diagnosis not present

## 2016-07-14 ENCOUNTER — Encounter: Payer: Self-pay | Admitting: Respiratory Therapy

## 2016-07-14 ENCOUNTER — Ambulatory Visit: Payer: 59

## 2016-07-14 DIAGNOSIS — J8410 Pulmonary fibrosis, unspecified: Secondary | ICD-10-CM

## 2016-07-14 DIAGNOSIS — Z7682 Awaiting organ transplant status: Secondary | ICD-10-CM | POA: Diagnosis not present

## 2016-07-14 NOTE — Progress Notes (Signed)
Pulmonary Individual Treatment Plan  Patient Details  Name: Erika Martinez MRN: [id number] Date of Birth: 07-Nov-1955 Referring Provider:   Flowsheet Row Pulmonary Rehab from 12/14/2015 in [facility] Cardiac and Pulmonary Rehab  Referring Provider  McQuaid      Initial Encounter Date:  Flowsheet Row Pulmonary Rehab from 12/14/2015 in [facility] Cardiac and Pulmonary Rehab  Date  12/14/15  Referring Provider  McQuaid      Visit Diagnosis: Pulmonary fibrosis ([place])  Patient's Home Medications on Admission:  Current Outpatient Prescriptions:    ADVAIR DISKUS 500-50 MCG/DOSE AEPB, INHALE 1 PUFF INTO THE LUNGS 2 (TWO) TIMES DAILY., Disp: 60 each, Rfl: 5   albuterol (PROVENTIL) (2.5 MG/3ML) 0.083% nebulizer solution, Take 3 mLs (2.5 mg total) by nebulization every 4 (four) hours as needed for wheezing or shortness of breath. DX Code: J84.10, Disp: 75 mL, Rfl: 12   azaTHIOprine (IMURAN) 50 MG tablet, Take 2 tablets (100 mg total) by mouth daily. Daily, Disp: 60 tablet, Rfl: 5   cholecalciferol (VITAMIN D) 1000 UNITS tablet, Take 2,000 Units by mouth daily., Disp: , Rfl:    diltiazem (CARDIZEM) 60 MG tablet, TAKE 1 TABLET (60 MG TOTAL) BY MOUTH DAILY., Disp: 30 tablet, Rfl: 2   fluconazole (DIFLUCAN) 150 MG tablet, TAKE 1 TABLET WEEKLY AS NEEDED FOR THRUSH, Disp: 4 tablet, Rfl: 2   fluticasone (FLONASE) 50 MCG/ACT nasal spray, Place 2 sprays into both nostrils daily., Disp: 16 g, Rfl: 6   gentamicin ointment (GARAMYCIN) 0.1 %, APPLY TO NOSE THREE TIMES DAILY, Disp: , Rfl: 15   loratadine (CLARITIN) 10 MG tablet, TAKE 1 TABLET (10 MG TOTAL) BY MOUTH 2 (TWO) TIMES DAILY., Disp: 60 tablet, Rfl: 3   nystatin (MYCOSTATIN) 100000 UNIT/ML suspension, TAKE 1 TABLESPOON BY MOUTH BEFORE MEALS AND AT BEDTIME (SWISH AND SPIT OUT), Disp: 473 mL, Rfl: 2   omeprazole (PRILOSEC) 40 MG capsule, TAKE 1 CAPSULE TWICE DAILY, Disp: 60 capsule, Rfl: 2   PEG 3350-KCl-NaBcb-NaCl-NaSulf (PEG-3350/ELECTROLYTES) 236 g  SOLR, Take by mouth., Disp: , Rfl:    predniSONE (DELTASONE) 10 MG tablet, Take 0.5 tablets (5 mg total) by mouth daily with breakfast., Disp: 30 tablet, Rfl: 5   sulfamethoxazole-trimethoprim (BACTRIM DS,SEPTRA DS) 800-160 MG tablet, TAKE 1 TABLET BY MOUTH 3 (THREE) TIMES A WEEK., Disp: , Rfl: 5   triamcinolone cream (KENALOG) 0.1 %, Apply 1 application topically 2 (two) times daily., Disp: 30 g, Rfl: 0   VENTOLIN HFA 108 (90 Base) MCG/ACT inhaler, INHALE 2 PUFFS INTO THE LUNGS EVERY 4 (FOUR) HOURS AS NEEDED FOR WHEEZING OR SHORTNESS OF BREATH., Disp: 18 g, Rfl: 5   vitamin B-12 (CYANOCOBALAMIN) 250 MCG tablet, Place 250 mcg under the tongue every other day. , Disp: , Rfl:   Past Medical History: Past Medical History:  Diagnosis Date   Abnormal hemoglobin (HCC)    Allergy    Anxiety    Asthma    Controlled insomnia    Erythrocytosis    GERD (gastroesophageal reflux disease)    Hep C w/o coma, chronic (HCC)    Hypertension    IBS (irritable bowel syndrome)    Lumbago    Mitral valve prolapse    Oxygen dependent    Plantar wart    Pulmonary fibrosis (HCC)    Scleroderma (HCC)    Dr. Pennie Banter   Seasonal allergies    Spondylolisthesis    Symptomatic menopausal or female climacteric states     Tobacco Use: History  Smoking Status   Never Smoker  Smokeless Tobacco   Never Used    Labs: Recent Review Flowsheet Data    Labs for ITP Cardiac and Pulmonary Rehab Latest Ref Rng & Units 07/14/2013 07/05/2015 08/02/2015   Cholestrol 100 - 199 mg/dL 169 - 196   LDLCALC 0 - 99 mg/dL 95 - 112(H)   HDL >39 mg/dL 58 - 68   Trlycerides 0 - 149 mg/dL 78 - 78   Hemoglobin A1c 4.8 - 5.6 % - 6.3(H) -       ADL UCSD:   Pulmonary Function Assessment:   Exercise Target Goals:    Exercise Program Goal: Individual exercise prescription set with THRR, safety & activity barriers. Participant demonstrates ability to understand and report RPE using BORG scale, to  self-measure pulse accurately, and to acknowledge the importance of the exercise prescription.  Exercise Prescription Goal: Starting with aerobic activity 30 plus minutes a day, 3 days per week for initial exercise prescription. Provide home exercise prescription and guidelines that participant acknowledges understanding prior to discharge.  Activity Barriers & Risk Stratification:   6 Minute Walk:   Initial Exercise Prescription:   Perform Capillary Blood Glucose checks as needed.  Exercise Prescription Changes:     Exercise Prescription Changes    Row Name 01/23/16 1400 02/06/16 1300 02/21/16 1100 03/06/16 1100 03/18/16 1400     Exercise Review   Progression Yes Yes Yes Yes Yes     Response to Exercise   Blood Pressure (Admit) 112/66 120/66 148/78  -- 124/68   Blood Pressure (Exercise) 150/84 124/74 112/66 160/80 152/70   Blood Pressure (Exit) 124/62 104/62 106/74 130/80 128/72   Heart Rate (Admit) 110 bpm 94 bpm 100 bpm 105 bpm 102 bpm   Heart Rate (Exercise) 118 bpm 112 bpm 106 bpm 106 bpm 115 bpm   Heart Rate (Exit) 108 bpm 100 bpm 95 bpm 98 bpm 99 bpm   Oxygen Saturation (Admit) 97 % 96 % 100 % 100 % 93 %   Oxygen Saturation (Exercise) 93 % 92 % 89 % 97 % 86 %   Oxygen Saturation (Exit) 100 % 95 % 100 % 99 % 100 %   Rating of Perceived Exertion (Exercise) 13 13 13 13 13    Perceived Dyspnea (Exercise) 3 3 3 3 3    Comments  --  --  --  -- Home Exercise Guidelines given 01/09/16   Duration  --  --  --  -- Progress to 45 minutes of aerobic exercise without signs/symptoms of physical distress   Intensity  --  --  --  -- THRR unchanged  111-142     Progression   Progression Continue to progress workloads to maintain intensity without signs/symptoms of physical distress. Continue to progress workloads to maintain intensity without signs/symptoms of physical distress. Continue to progress workloads to maintain intensity without signs/symptoms of physical distress. Continue to  progress workloads to maintain intensity without signs/symptoms of physical distress. Continue to progress workloads to maintain intensity without signs/symptoms of physical distress.   Average METs  --  --  --  -- 2.45     Resistance Training   Training Prescription Yes Yes Yes Yes Yes   Weight 3 3 3 3 3  right 2 left   Reps 10-15 10-15 10-12 10-12 10-12     Interval Training   Interval Training No  -- No No No     Oxygen   Oxygen Continuous  -- Continuous Continuous Continuous   Liters 8 8  15  L/min on TM 8  8 8     Treadmill   MPH 1.2 1.2 1.3 1.2 1.3   Grade 0 0 0 0 0   Minutes 12 25 20 20 20    METs 1.92 1.92 2 1.92 2     REL-XR   Level 3 4 4 5 4    Minutes 15 15 15 15 15    METs  -- 3 2.6  -- 2.9     Home Exercise Plan   Plans to continue exercise at  --  --  --  -- Home  Stationary bike   Frequency  --  --  --  -- Add 4 additional days to program exercise sessions.   [place] Name 04/16/16 1600             Response to Exercise   Blood Pressure (Exercise) 148/72       Blood Pressure (Exit) 126/54       Heart Rate (Admit) 104 bpm       Heart Rate (Exercise) 102 bpm       Heart Rate (Exit) 89 bpm       Oxygen Saturation (Admit) 95 %       Oxygen Saturation (Exercise) 94 %       Oxygen Saturation (Exit) 98 %       Rating of Perceived Exertion (Exercise) 12       Perceived Dyspnea (Exercise) 2       Symptoms none       Comments Home Exercise Guidelines given 01/09/16       Duration Progress to 45 minutes of aerobic exercise without signs/symptoms of physical distress       Intensity THRR unchanged         Progression   Progression Continue to progress workloads to maintain intensity without signs/symptoms of physical distress.       Average METs 2.13         Resistance Training   Training Prescription Yes       Weight 3 right 2 left       Reps 10-12         Interval Training   Interval Training No         Oxygen   Oxygen Continuous       Liters 8  15L on  treadmill         Treadmill   MPH 1.3       Grade 0       Minutes --  omitted at last visit       METs 2         NuStep   Level 4       Minutes 15       METs 1.5         REL-XR   Level 3       Minutes 15       METs 2.9         Home Exercise Plan   Plans to continue exercise at Home  Stationary bike       Frequency Add 4 additional days to program exercise sessions.          Exercise Comments:     Exercise Comments    Row Name 01/23/16 1423 02/06/16 1329 02/06/16 1605 02/21/16 1152 03/06/16 1119   Exercise Comments Zacoria continues to progress well with exercise. Skylarr is progressing well with exercise.  She has added resistnace on the XR and more time to TM. Ms Navarre had an oxygen  titration walk at [facility] for her Lung Transplant surgery, so we will not perform a Mid47mwd Trena has progressed well with exercise. Gyda has not been able to attend regularly as she is back at work.   [place] Name 03/18/16 1452 04/02/16 1551 04/16/16 1614 04/29/16 1550 05/28/16 1529   Exercise Comments Jahnelle is trying to find a routine with her work schedule to allow her to get to rehab.  She continues to make improvements when she is here.  We will continue to monitor her progression. Nivedha has been out with work since last review. Kollette has had one visit since last review.  We will continue to monitor her progression. Addalyne has not been in since last review.  No progression made. Consuello has not attended since last review.      Discharge Exercise Prescription (Final Exercise Prescription Changes):     Exercise Prescription Changes - 04/16/16 1600      Response to Exercise   Blood Pressure (Exercise) 148/72   Blood Pressure (Exit) 126/54   Heart Rate (Admit) 104 bpm   Heart Rate (Exercise) 102 bpm   Heart Rate (Exit) 89 bpm   Oxygen Saturation (Admit) 95 %   Oxygen Saturation (Exercise) 94 %   Oxygen Saturation (Exit) 98 %   Rating of Perceived Exertion (Exercise) 12   Perceived Dyspnea  (Exercise) 2   Symptoms none   Comments Home Exercise Guidelines given 01/09/16   Duration Progress to 45 minutes of aerobic exercise without signs/symptoms of physical distress   Intensity THRR unchanged     Progression   Progression Continue to progress workloads to maintain intensity without signs/symptoms of physical distress.   Average METs 2.13     Resistance Training   Training Prescription Yes   Weight 3 right 2 left   Reps 10-12     Interval Training   Interval Training No     Oxygen   Oxygen Continuous   Liters 8  15L on treadmill     Treadmill   MPH 1.3   Grade 0   Minutes --  omitted at last visit   METs 2     NuStep   Level 4   Minutes 15   METs 1.5     REL-XR   Level 3   Minutes 15   METs 2.9     Home Exercise Plan   Plans to continue exercise at Home  Stationary bike   Frequency Add 4 additional days to program exercise sessions.       Nutrition:  Target Goals: Understanding of nutrition guidelines, daily intake of sodium 1500mg , cholesterol 200mg , calories 30% from fat and 7% or less from saturated fats, daily to have 5 or more servings of fruits and vegetables.  Biometrics:    Nutrition Therapy Plan and Nutrition Goals:   Nutrition Discharge: Rate Your Plate Scores:   Psychosocial: Target Goals: Acknowledge presence or absence of depression, maximize coping skills, provide positive support system. Participant is able to verbalize types and ability to use techniques and skills needed for reducing stress and depression.  Initial Review & Psychosocial Screening:   Quality of Life Scores:   PHQ-9: Recent Review Flowsheet Data    Depression screen [facility] 2/9 06/27/2016 03/26/2016 12/14/2015 10/03/2015 07/03/2015   Decreased Interest 0 0 1 0 0   Down, Depressed, Hopeless 0 0 1 0 0   PHQ - 2 Score 0 0 2 0 0   Altered sleeping - - 1 - -  Tired, decreased energy - - 2 - -   Change in appetite - - 2 - -   Feeling bad or failure about  yourself  - - 1 - -   Trouble concentrating - - 0 - -   Moving slowly or fidgety/restless - - 0 - -   Suicidal thoughts - - 0 - -   PHQ-9 Score - - 8 - -   Difficult doing work/chores - - Somewhat difficult - -      Psychosocial Evaluation and Intervention:   Psychosocial Re-Evaluation:     Psychosocial Re-Evaluation    Row Name 01/21/16 1655 03/05/16 1309 03/19/16 1027 05/20/16 0644       Psychosocial Re-Evaluation   Comments Counselor follow up with Ms. Bobbye Charleston today.  She reports doing well and enjoying this program; including experiencing benefits of increased stamina.  She reports being approved for the lung transplant list and has had to go through multiple tests and meet extensive criteria for this to occur.  Ms. Bonder reports this is stressful for her currently as well as school starting back in a few weeks.  Ms. Kadrmas reports sleeping "okay" currently and hopes that will continue, although she has to wean off her medications that have helped her sleep PRN.  Counselor will continue to follow with Ms. Orcutt throughout the course of this program.  Patient psychosocial assessment reveals no barriers to participation in Pulmonary Rehab. Psychosocial areas that are currently affecting patient's rehab experience include concerns about , financial resources,   And Work. Patient does continue to exhibit positive  coping skills to deal with her psychosocial concerns. Offered emotional support and reassurance. Patient does feel she is making progress toward Pulmonary Rehab goals. Patient reports her health and activity level has improved in the past 30 days as evidenced by patient's report of increased ability to manage her daily activities and return to work for an eight hour day..  Patient reports  feeling positive about current and projected progression in Pulmonary Rehab. After reviewing the patient's treatment plan, the patient is making progress toward Pulmonary Rehab goals. Patient's  rate of progress toward rehab goals is good. Plan of action to help patient continue to work towards rehab goals include expecting her to be able to attend program 3 days a week even though she has returned to work. Watching progress towards her ability to be placed on the transplant list. Miryah has raised almost 9 thousand dollars, has stopped taking the Xanax and is close to her weight loss goal required to be placed on the list.  Will continue to monitor and evaluate progress toward psychosocial goal(s). Counselor follow up with Ms. Trace on this date reporting dealing with a great deal of stress in regards to being on the transplant list.  Although she has raised a great deal of the funds required, there is still some to go; she also has had to stop taking her Xanax which has been helpful for dealing with stress and anxiety as well as sleep.  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  Ms. 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  She continues to have a positive outlook and reports her faith and exercise and leaning on friends help her cope with all that she  is currently going through.  Counselor encouraged her to continue to practice her stress management skills and offered support and encouragement for all the progress made so far in this process.   Ms Dionisio Paschal has been in contact with me through email. She is anxious for the results of her final [facility] meeting and the closeness of the lung transplant surgery. She was very stressed with the required fundraising for the surgery, but with the help of her family and church, she raised the money. Ms Chamu will have great support with the surgery.      Education: Education Goals: Education classes will be provided on a weekly basis, covering required topics.  Participant will state understanding/return demonstration of topics presented.  Learning Barriers/Preferences:   Education Topics: Initial Evaluation Education: - Verbal, written and demonstration of respiratory meds, RPE/PD scales, oximetry and breathing techniques. Instruction on use of nebulizers and MDIs: cleaning and proper use, rinsing mouth with steroid doses and importance of monitoring MDI activations. Flowsheet Row Documentation from 03/14/2015 in [place]  Date  12/05/14  Educator  LB  Instruction Review Code  2- meets goals/outcomes      General Nutrition Guidelines/Fats and Fiber: -Group instruction provided by verbal, written material, models and posters to present the general guidelines for heart healthy nutrition. Gives an explanation and review of dietary fats and fiber. Flowsheet Row Pulmonary Rehab from 02/01/2016 in [facility] Cardiac and Pulmonary Rehab  Date  12/31/15  Educator  CR  Instruction Review Code  2- meets goals/outcomes      Controlling Sodium/Reading Food Labels: -Group verbal and written material supporting the discussion of sodium use in heart healthy nutrition. Review and explanation with models, verbal and written materials for utilization of the food label. Flowsheet Row Pulmonary Rehab from 02/01/2016 in [facility] Cardiac and Pulmonary Rehab  Date  01/07/16  Educator  CR  Instruction Review Code  2- meets goals/outcomes      Exercise Physiology & Risk Factors: - Group verbal and written instruction with models to review the exercise physiology of the cardiovascular system and associated critical values. Details cardiovascular disease risk factors and the goals associated with each risk factor. Flowsheet Row Pulmonary Rehab from 02/01/2016 in [facility] Cardiac and Pulmonary Rehab  Date  12/26/15  Educator  Alberteen Sam and Nada Maclachlan   Instruction Review Code  2- meets goals/outcomes      Aerobic Exercise &  Resistance Training: - Gives group verbal and written discussion on the health impact of inactivity. On the components of aerobic and resistive training programs and the benefits of this training and how to safely progress through these programs.   Flexibility, Balance, General Exercise Guidelines: - Provides group verbal and written instruction on the benefits of flexibility and balance training programs. Provides general exercise guidelines with specific guidelines to those with heart or lung disease. Demonstration and skill practice provided.   Stress Management: - Provides group verbal and written instruction about the health risks of elevated stress, cause of high stress, and healthy ways to reduce stress. Flowsheet Row Pulmonary Rehab from 02/01/2016 in [facility] Cardiac and Pulmonary Rehab  Date  12/19/15  Educator  Kathreen Cornfield  Instruction Review Code  2- meets goals/outcomes      Depression: - Provides group verbal and written instruction on the correlation between heart/lung disease and depressed mood, treatment options, and the stigmas associated with seeking treatment.   Exercise & Equipment Safety: - Individual verbal instruction and demonstration of equipment  use and safety with use of the equipment. Flowsheet Row Pulmonary Rehab from 02/01/2016 in [facility] Cardiac and Pulmonary Rehab  Date  12/14/15  Educator  C. EnterkinRN  Instruction Review Code  1- partially meets, needs review/practice      Infection Prevention: - Provides verbal and written material to individual with discussion of infection control including proper hand washing and proper equipment cleaning during exercise session. Flowsheet Row Pulmonary Rehab from 02/01/2016 in [facility] Cardiac and Pulmonary Rehab  Date  12/14/15  Educator  C. EnterkinRN  Instruction Review Code  2- meets goals/outcomes      Falls Prevention: - Provides verbal and written material to individual with discussion of falls prevention and  safety. Flowsheet Row Pulmonary Rehab from 02/01/2016 in [facility] Cardiac and Pulmonary Rehab  Date  12/14/15  Educator  C. [place]  Instruction Review Code  2- meets goals/outcomes      Diabetes: - Individual verbal and written instruction to review signs/symptoms of diabetes, desired ranges of glucose level fasting, after meals and with exercise. Advice that pre and post exercise glucose checks will be done for 3 sessions at entry of program.   Chronic Lung Diseases: - Group verbal and written instruction to review new updates, new respiratory medications, new advancements in procedures and treatments. Provide informative websites and "800" numbers of self-education. Flowsheet Row Pulmonary Rehab from 02/01/2016 in [facility] Cardiac and Pulmonary Rehab  Date  12/24/15  Educator  LB  Instruction Review Code  2- meets goals/outcomes      Lung Procedures: - Group verbal and written instruction to describe testing methods done to diagnose lung disease. Review the outcome of test results. Describe the treatment choices: Pulmonary Function Tests, ABGs and oximetry. Flowsheet Row Documentation from 03/14/2015 in [place]  Date  01/12/15  Educator  sj  Instruction Review Code  2- meets goals/outcomes      Energy Conservation: - Provide group verbal and written instruction for methods to conserve energy, plan and organize activities. Instruct on pacing techniques, use of adaptive equipment and posture/positioning to relieve shortness of breath. Flowsheet Row Documentation from 03/14/2015 in [place]  Date  01/24/15  Educator  SW  Instruction Review Code  2- meets goals/outcomes      Triggers: - Group verbal and written instruction to review types of environmental controls: home humidity, furnaces, filters, dust mite/pet prevention, HEPA vacuums. To discuss weather changes, air quality and the benefits of nasal  washing. Flowsheet Row Documentation from 03/14/2015 in [place]  Date  12/18/14  Educator  LB  Instruction Review Code  2- meets goals/outcomes      Exacerbations: - Group verbal and written instruction to provide: warning signs, infection symptoms, calling MD promptly, preventive modes, and value of vaccinations. Review: effective airway clearance, coughing and/or vibration techniques. Create an Sports administrator. Flowsheet Row Pulmonary Rehab from 02/01/2016 in [facility] Cardiac and Pulmonary Rehab  Date  01/21/16  Educator  LB  Instruction Review Code  2- meets goals/outcomes      Oxygen: - Individual and group verbal and written instruction on oxygen therapy. Includes supplement oxygen, available portable oxygen systems, continuous and intermittent flow rates, oxygen safety, concentrators, and Medicare reimbursement for oxygen. Flowsheet Row Documentation from 03/14/2015 in [place]  Date  12/05/14  Educator  LB  Instruction Review Code  2- meets goals/outcomes      Respiratory Medications: - Group verbal and  written instruction to review medications for lung disease. Drug class, frequency, complications, importance of spacers, rinsing mouth after steroid MDI's, and proper cleaning methods for nebulizers. Flowsheet Row Documentation from 03/14/2015 in [place]  Date  12/22/14  Educator  [place]  Instruction Review Code  2- meets goals/outcomes [Spacer and instructions given to pt. ]      AED/CPR: - Group verbal and written instruction with the use of models to demonstrate the basic use of the AED with the basic ABC's of resuscitation. Flowsheet Row Pulmonary Rehab from 02/01/2016 in [facility] Cardiac and Pulmonary Rehab  Date  01/11/16  Educator  CE  Instruction Review Code  2- meets goals/outcomes      Breathing Retraining: - Provides individuals verbal and written  instruction on purpose, frequency, and proper technique of diaphragmatic breathing and pursed-lipped breathing. Applies individual practice skills. Flowsheet Row Pulmonary Rehab from 02/01/2016 in [facility] Cardiac and Pulmonary Rehab  Date  12/19/15  Educator  [facility]  Instruction Review Code  2- meets goals/outcomes      Anatomy and Physiology of the Lungs: - Group verbal and written instruction with the use of models to provide basic lung anatomy and physiology related to function, structure and complications of lung disease. Flowsheet Row Pulmonary Rehab from 02/01/2016 in [facility] Cardiac and Pulmonary Rehab  Date  02/01/16  Educator  [place]  Instruction Review Code  2- meets goals/outcomes      Heart Failure: - Group verbal and written instruction on the basics of heart failure: signs/symptoms, treatments, explanation of ejection fraction, enlarged heart and cardiomyopathy. Flowsheet Row Pulmonary Rehab from 02/01/2016 in [facility] Cardiac and Pulmonary Rehab  Date  01/25/16  Educator  CE  Instruction Review Code  2- meets goals/outcomes      Sleep Apnea: - Individual verbal and written instruction to review Obstructive Sleep Apnea. Review of risk factors, methods for diagnosing and types of masks and machines for OSA.   Anxiety: - Provides group, verbal and written instruction on the correlation between heart/lung disease and anxiety, treatment options, and management of anxiety. Flowsheet Row Documentation from 03/14/2015 in [place]  Date  01/31/15  Educator  [facility]  Instruction Review Code  2- Meets goals/outcomes      Relaxation: - Provides group, verbal and written instruction about the benefits of relaxation for patients with heart/lung disease. Also provides patients with examples of relaxation techniques. Flowsheet Row Pulmonary Rehab from 02/01/2016 in [facility] Cardiac and Pulmonary Rehab  Date  01/16/16  Educator  Kathreen Cornfield, [facility]  Instruction Review  Code  2- Meets goals/outcomes      Knowledge Questionnaire Score:    Core Components/Risk Factors/Patient Goals at Admission:   Core Components/Risk Factors/Patient Goals Review:      Goals and Risk Factor Review    Row Name 01/22/16 0735 01/29/16 0751 03/05/16 1309 03/18/16 0926 05/20/16 0641     Core Components/Risk Factors/Patient Goals Review   Personal Goals Review Sedentary;Increase Strength and Stamina;Weight Management/Obesity Develop more efficient breathing techniques such as purse lipped breathing and diaphragmatic breathing and practicing self-pacing with activity.;Improve shortness of breath with ADL's;Increase knowledge of respiratory medications and ability to use respiratory devices properly.;Increase Strength and Stamina;Sedentary Increase knowledge of respiratory medications and ability to use respiratory devices properly.;Improve shortness of breath with ADL's;Develop more efficient breathing techniques such as purse lipped breathing and diaphragmatic breathing and practicing self-pacing with activity.;Increase Strength and Stamina;Weight Management/Obesity Weight Management/Obesity;Sedentary;Increase Strength and Stamina;Improve shortness of breath  with ADL's;Increase knowledge of respiratory medications and ability to use respiratory devices properly.;Develop more efficient breathing techniques such as purse lipped breathing and diaphragmatic breathing and practicing self-pacing with activity.  --   Review Ms Billups received a letter from the transplant team. She is required to exercise 3 days/week in [facility] and also loss 7-10lbs. She plans to meet with the dietitian one on one for help with this weight loss. Ms Mcmanama meets with the transplant team on 02/04/16 and 02/05/16. She has worked her exercise goal on the TM to 62mins. and 1.78mph. [facility]'s list rquirement for transplant lists 1.5-2.0 on the TM, so that will be her next goal. She has a good understaning of her MDI's  and her shortness of breath - uses pacing , PLB, and adjusts her oxygen indepently for increased exercise. Aalayiah has been out for a few weeks, since she returned to work. She has been able to clear her schedule to attend Pulmonary Rehab 3 days a week as well as maintain her work. She continues to state understanding of properuse of her MDI.s  Jisell continues to work toward required weight loss and is down 7 - 8 pounds. She continues to manage her medications and uses her purse lipped breathing technique as needed.  Ms Pettway is progressing toward her lung transplant. She weighed at 145.4lbs and her goal is under 140lbs. She has raised most of her funding and has her next meeting with the transplant team on December 6th. I gave her a spacer to use with her albuterol MDI. She has a good understanding of her MDI's and oxygen - self adjusting the flow with increased exercise to manage her shortness of breath and meet her time goals on the exercise equipment. PLB is a part of her active life and managing her pulmonary fibrosis. Ms Stadler had her appointment with [facility] 05/13/16 - final review for lung transplant. She raised her money as well, so she is currently waiting for the results of the [place] staff meeting today on 05/20/16.    Expected Outcomes  -- Work towards increasing her spend on the TM to 1.78mph. Continue compliance with medications and use of MDI's. Continue to work toard reaching the weight loss, fund raising and exercise goals required to be placed on the transplant list to see her name on the list and ready to start "[employer]".  -- Continue with plans for a lung transplant.      Core Components/Risk Factors/Patient Goals at Discharge (Final Review):      Goals and Risk Factor Review - 05/20/16 0641      Core Components/Risk Factors/Patient Goals Review   Review Ms Masloski had her appointment with [facility] 05/13/16 - final review for lung transplant. She raised her money as well, so she is currently  waiting for the results of the [place] staff meeting today on 05/20/16.    Expected Outcomes Continue with plans for a lung transplant.      ITP Comments:     ITP Comments    Row Name 02/06/16 1606 03/06/16 1121 04/30/16 1344 06/18/16 0925 06/24/16 1520   ITP Comments Ms Thuma had a PFT at [facility] with a DLCO of 17%. She also had an ABG with PO2 of 42. 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 Amandah has not been able to attend regularly as she is back at work. Ms Donn has emailed me. She  is very busy with work, fund raising, and her up coming [place] appointment on 05/14/16.. Ms Borkenhagen emailed me for an update -  she will hear again from [place] on 06/24/16 about her lung transplant procedure. She has been busy at work and did not get out with the cold weather. Update from [facility]: Alessandra Grout, RT - 06/24/2016 2:20 PM EST Pulmonary Rehabilitation Assessment  Diagnosis- ILD.  Patient goes by Diane. Lives in [place].  Respiratory Medications/Therapies- Advair, Albuterol  PFTS'/ABG'S- 7.43/38/40; FEV1/FVC 56%, FEV1 29% predicted, FVC 42% predicted. PFT report states that patient had "difficulty with testing maneuvers".  Smoking History- non smoker, smoke free environment.  Cough- yes, dry cough.  Oxygen Use- 4 at rest, 5 with sleep, used 10 with 52mwt today. [facility] in [place] is O2 provider. Has 2 concentrators- one max 5 the other max 10 that she uses while exercising. Portable - has POC that she uses "while still" and cylinders to use with activity.  Ventilatory Support--  Vaccinations-  Auscultation- Crackles in bases  Concerns- Is still working. Wants time to figure out her schedule before starting rehab. She thinks is may be close to Feb before she will start.  Treatment Plan- Joining our pre transplant pulmonary rehab-may be close to Feb. Went over pre tx physical requirements and breathing techniques. Orientation  done with patient and caregiver.     Row Name 07/14/16 0808           ITP Comments Received an email from [place] - she starts exercising at [facility] on 07/14/2016 and will have a few more tests. After that , [facility] states her lung transplant plant could be at anytime.          Comments: Ms Okabe is discharged from [facility] and will start her preparation for her Lung Transplant surgery at the [facility] 07/14/16. [facility] has informed her that her surgery could be at anytime.

## 2016-07-14 NOTE — Progress Notes (Signed)
Discharge Summary  Patient Details  Name: Erika Martinez MRN: [id number] Date of Birth: 07-10-1955 Referring Provider:   Flowsheet Row Pulmonary Rehab from 12/14/2015 in [facility] Cardiac and Pulmonary Rehab  Referring Provider  McQuaid       Number of Visits: 23 Reason for Discharge:  Early Exit:  Erika Martinez starts her exercise at [facility] 07/14/16 for her Lung Transplant surgery preparation.  Smoking History:  History  Smoking Status   Never Smoker  Smokeless Tobacco   Never Used    Diagnosis:  Pulmonary fibrosis ([place])  ADL UCSD:   Initial Exercise Prescription:   Discharge Exercise Prescription (Final Exercise Prescription Changes):     Exercise Prescription Changes - 04/16/16 1600      Response to Exercise   Blood Pressure (Exercise) 148/72   Blood Pressure (Exit) 126/54   Heart Rate (Admit) 104 bpm   Heart Rate (Exercise) 102 bpm   Heart Rate (Exit) 89 bpm   Oxygen Saturation (Admit) 95 %   Oxygen Saturation (Exercise) 94 %   Oxygen Saturation (Exit) 98 %   Rating of Perceived Exertion (Exercise) 12   Perceived Dyspnea (Exercise) 2   Symptoms none   Comments Home Exercise Guidelines given 01/09/16   Duration Progress to 45 minutes of aerobic exercise without signs/symptoms of physical distress   Intensity THRR unchanged     Progression   Progression Continue to progress workloads to maintain intensity without signs/symptoms of physical distress.   Average METs 2.13     Resistance Training   Training Prescription Yes   Weight 3 right 2 left   Reps 10-12     Interval Training   Interval Training No     Oxygen   Oxygen Continuous   Liters 8  15L on treadmill     Treadmill   MPH 1.3   Grade 0   Minutes --  omitted at last visit   METs 2     NuStep   Level 4   Minutes 15   METs 1.5     REL-XR   Level 3   Minutes 15   METs 2.9     Home Exercise Plan   Plans to continue exercise at Home  Stationary bike   Frequency Add 4 additional  days to program exercise sessions.      Functional Capacity:   Psychological, QOL, Others - Outcomes: PHQ 2/9: Depression screen [facility] 2/9 06/27/2016 03/26/2016 12/14/2015 10/03/2015 07/03/2015  Decreased Interest 0 0 1 0 0  Down, Depressed, Hopeless 0 0 1 0 0  PHQ - 2 Score 0 0 2 0 0  Altered sleeping - - 1 - -  Tired, decreased energy - - 2 - -  Change in appetite - - 2 - -  Feeling bad or failure about yourself  - - 1 - -  Trouble concentrating - - 0 - -  Moving slowly or fidgety/restless - - 0 - -  Suicidal thoughts - - 0 - -  PHQ-9 Score - - 8 - -  Difficult doing work/chores - - Somewhat difficult - -    Quality of Life:   Personal Goals: Goals established at orientation with interventions provided to work toward goal.    Personal Goals Discharge:     Goals and Risk Factor Review    Row Name 01/22/16 0735 01/29/16 0751 03/05/16 1309 03/18/16 0926 05/20/16 0641     Core Components/Risk Factors/Patient Goals Review   Personal Goals Review Sedentary;Increase Strength and Stamina;Weight  Management/Obesity Develop more efficient breathing techniques such as purse lipped breathing and diaphragmatic breathing and practicing self-pacing with activity.;Improve shortness of breath with ADL's;Increase knowledge of respiratory medications and ability to use respiratory devices properly.;Increase Strength and Stamina;Sedentary Increase knowledge of respiratory medications and ability to use respiratory devices properly.;Improve shortness of breath with ADL's;Develop more efficient breathing techniques such as purse lipped breathing and diaphragmatic breathing and practicing self-pacing with activity.;Increase Strength and Stamina;Weight Management/Obesity Weight Management/Obesity;Sedentary;Increase Strength and Stamina;Improve shortness of breath with ADL's;Increase knowledge of respiratory medications and ability to use respiratory devices properly.;Develop more efficient breathing  techniques such as purse lipped breathing and diaphragmatic breathing and practicing self-pacing with activity.  --   Review Erika Martinez received a letter from the transplant team. She is required to exercise 3 days/week in [facility] and also loss 7-10lbs. She plans to meet with the dietitian one on one for help with this weight loss. Erika Martinez meets with the transplant team on 02/04/16 and 02/05/16. She has worked her exercise goal on the TM to 58mins. and 1.69mph. [facility]'s list rquirement for transplant lists 1.5-2.0 on the TM, so that will be her next goal. She has a good understaning of her MDI's and her shortness of breath - uses pacing , PLB, and adjusts her oxygen indepently for increased exercise. Erika Martinez has been out for a few weeks, since she returned to work. She has been able to clear her schedule to attend Pulmonary Rehab 3 days a week as well as maintain her work. She continues to state understanding of properuse of her MDI.s  Erika Martinez continues to work toward required weight loss and is down 7 - 8 pounds. She continues to manage her medications and uses her purse lipped breathing technique as needed.  Erika Martinez is progressing toward her lung transplant. She weighed at 145.4lbs and her goal is under 140lbs. She has raised most of her funding and has her next meeting with the transplant team on December 6th. I gave her a spacer to use with her albuterol MDI. She has a good understanding of her MDI's and oxygen - self adjusting the flow with increased exercise to manage her shortness of breath and meet her time goals on the exercise equipment. PLB is a part of her active life and managing her pulmonary fibrosis. Erika Martinez had her appointment with [facility] 05/13/16 - final review for lung transplant. She raised her money as well, so she is currently waiting for the results of the [place] staff meeting today on 05/20/16.    Expected Outcomes  -- Work towards increasing her spend on the TM to 1.70mph. Continue compliance  with medications and use of MDI's. Continue to work toard reaching the weight loss, fund raising and exercise goals required to be placed on the transplant list to see her name on the list and ready to start "[employer]".  -- Continue with plans for a lung transplant.      Nutrition & Weight - Outcomes:    Nutrition:   Nutrition Discharge:   Education Questionnaire Score:   Goals reviewed with patient; copy given to patient.

## 2016-07-15 DIAGNOSIS — R262 Difficulty in walking, not elsewhere classified: Secondary | ICD-10-CM | POA: Diagnosis not present

## 2016-07-16 ENCOUNTER — Ambulatory Visit: Payer: 59

## 2016-07-16 DIAGNOSIS — Z7682 Awaiting organ transplant status: Secondary | ICD-10-CM | POA: Diagnosis not present

## 2016-07-16 DIAGNOSIS — Z01419 Encounter for gynecological examination (general) (routine) without abnormal findings: Secondary | ICD-10-CM | POA: Diagnosis not present

## 2016-07-18 ENCOUNTER — Ambulatory Visit: Payer: 59

## 2016-07-21 ENCOUNTER — Ambulatory Visit: Payer: 59

## 2016-07-21 ENCOUNTER — Telehealth: Payer: Self-pay | Admitting: Family Medicine

## 2016-07-21 NOTE — Telephone Encounter (Signed)
Pt would like a call back to see if pt has had pneumonia vaccine and if she got a shingles shot. Please return her call.

## 2016-07-22 DIAGNOSIS — J9611 Chronic respiratory failure with hypoxia: Secondary | ICD-10-CM | POA: Diagnosis not present

## 2016-07-22 DIAGNOSIS — Z01818 Encounter for other preprocedural examination: Secondary | ICD-10-CM | POA: Diagnosis not present

## 2016-07-22 DIAGNOSIS — Z7682 Awaiting organ transplant status: Secondary | ICD-10-CM | POA: Diagnosis not present

## 2016-07-22 DIAGNOSIS — J8410 Pulmonary fibrosis, unspecified: Secondary | ICD-10-CM | POA: Diagnosis not present

## 2016-07-22 DIAGNOSIS — D72829 Elevated white blood cell count, unspecified: Secondary | ICD-10-CM | POA: Diagnosis not present

## 2016-07-22 DIAGNOSIS — D649 Anemia, unspecified: Secondary | ICD-10-CM | POA: Diagnosis not present

## 2016-07-22 DIAGNOSIS — R918 Other nonspecific abnormal finding of lung field: Secondary | ICD-10-CM | POA: Diagnosis not present

## 2016-07-22 NOTE — Telephone Encounter (Signed)
Patient notified about her Pneumonia vaccine and she has never had a Shingles vaccine documented.

## 2016-07-23 ENCOUNTER — Ambulatory Visit: Payer: 59

## 2016-07-23 DIAGNOSIS — R1312 Dysphagia, oropharyngeal phase: Secondary | ICD-10-CM | POA: Diagnosis not present

## 2016-07-24 DIAGNOSIS — Z7682 Awaiting organ transplant status: Secondary | ICD-10-CM | POA: Diagnosis not present

## 2016-07-24 DIAGNOSIS — I517 Cardiomegaly: Secondary | ICD-10-CM | POA: Diagnosis not present

## 2016-07-24 DIAGNOSIS — Z01818 Encounter for other preprocedural examination: Secondary | ICD-10-CM | POA: Diagnosis not present

## 2016-07-25 ENCOUNTER — Ambulatory Visit: Payer: 59

## 2016-07-25 ENCOUNTER — Other Ambulatory Visit: Payer: Self-pay

## 2016-07-25 DIAGNOSIS — J849 Interstitial pulmonary disease, unspecified: Secondary | ICD-10-CM | POA: Diagnosis not present

## 2016-07-25 DIAGNOSIS — Z01810 Encounter for preprocedural cardiovascular examination: Secondary | ICD-10-CM | POA: Diagnosis not present

## 2016-07-25 DIAGNOSIS — M351 Other overlap syndromes: Secondary | ICD-10-CM | POA: Diagnosis not present

## 2016-07-25 DIAGNOSIS — Z7682 Awaiting organ transplant status: Secondary | ICD-10-CM | POA: Diagnosis not present

## 2016-07-25 MED ORDER — OMEPRAZOLE 40 MG PO CPDR: 40.0000 mg | DELAYED_RELEASE_CAPSULE | Freq: Two times a day (BID) | ORAL | 1 refills | Status: DC

## 2016-07-25 NOTE — Telephone Encounter (Signed)
Patient requesting refill of Omeprazole with a 90 day supply please to [facility].

## 2016-07-28 ENCOUNTER — Ambulatory Visit: Payer: 59

## 2016-07-28 DIAGNOSIS — D72829 Elevated white blood cell count, unspecified: Secondary | ICD-10-CM | POA: Diagnosis not present

## 2016-07-28 DIAGNOSIS — Z7682 Awaiting organ transplant status: Secondary | ICD-10-CM | POA: Diagnosis not present

## 2016-07-28 DIAGNOSIS — D638 Anemia in other chronic diseases classified elsewhere: Secondary | ICD-10-CM | POA: Diagnosis not present

## 2016-07-29 ENCOUNTER — Other Ambulatory Visit: Payer: Self-pay | Admitting: Pulmonary Disease

## 2016-07-30 ENCOUNTER — Ambulatory Visit: Payer: 59

## 2016-07-30 DIAGNOSIS — J9610 Chronic respiratory failure, unspecified whether with hypoxia or hypercapnia: Secondary | ICD-10-CM | POA: Diagnosis not present

## 2016-07-30 DIAGNOSIS — R0902 Hypoxemia: Secondary | ICD-10-CM | POA: Diagnosis not present

## 2016-07-30 DIAGNOSIS — J8410 Pulmonary fibrosis, unspecified: Secondary | ICD-10-CM | POA: Diagnosis not present

## 2016-08-01 ENCOUNTER — Ambulatory Visit: Payer: 59

## 2016-08-04 ENCOUNTER — Ambulatory Visit: Payer: 59

## 2016-08-04 DIAGNOSIS — Z7682 Awaiting organ transplant status: Secondary | ICD-10-CM | POA: Diagnosis not present

## 2016-08-06 ENCOUNTER — Ambulatory Visit: Payer: 59

## 2016-08-07 DIAGNOSIS — R262 Difficulty in walking, not elsewhere classified: Secondary | ICD-10-CM | POA: Diagnosis not present

## 2016-08-08 ENCOUNTER — Ambulatory Visit: Payer: 59

## 2016-08-09 DIAGNOSIS — J9610 Chronic respiratory failure, unspecified whether with hypoxia or hypercapnia: Secondary | ICD-10-CM | POA: Diagnosis not present

## 2016-08-09 DIAGNOSIS — J8410 Pulmonary fibrosis, unspecified: Secondary | ICD-10-CM | POA: Diagnosis not present

## 2016-08-09 DIAGNOSIS — R0902 Hypoxemia: Secondary | ICD-10-CM | POA: Diagnosis not present

## 2016-08-11 ENCOUNTER — Ambulatory Visit: Payer: 59

## 2016-08-13 ENCOUNTER — Ambulatory Visit: Payer: 59

## 2016-08-15 ENCOUNTER — Ambulatory Visit: Payer: 59

## 2016-08-18 ENCOUNTER — Ambulatory Visit: Payer: 59

## 2016-08-20 ENCOUNTER — Ambulatory Visit: Payer: 59

## 2016-08-22 ENCOUNTER — Ambulatory Visit: Payer: 59

## 2016-08-25 ENCOUNTER — Ambulatory Visit: Payer: 59

## 2016-08-25 DIAGNOSIS — J9610 Chronic respiratory failure, unspecified whether with hypoxia or hypercapnia: Secondary | ICD-10-CM | POA: Diagnosis not present

## 2016-08-25 DIAGNOSIS — T86818 Other complications of lung transplant: Secondary | ICD-10-CM | POA: Diagnosis not present

## 2016-08-25 DIAGNOSIS — T380X5A Adverse effect of glucocorticoids and synthetic analogues, initial encounter: Secondary | ICD-10-CM | POA: Diagnosis not present

## 2016-08-25 DIAGNOSIS — Z79899 Other long term (current) drug therapy: Secondary | ICD-10-CM | POA: Diagnosis not present

## 2016-08-25 DIAGNOSIS — D849 Immunodeficiency, unspecified: Secondary | ICD-10-CM | POA: Diagnosis not present

## 2016-08-25 DIAGNOSIS — J811 Chronic pulmonary edema: Secondary | ICD-10-CM | POA: Diagnosis not present

## 2016-08-25 DIAGNOSIS — M349 Systemic sclerosis, unspecified: Secondary | ICD-10-CM | POA: Diagnosis not present

## 2016-08-25 DIAGNOSIS — R739 Hyperglycemia, unspecified: Secondary | ICD-10-CM | POA: Diagnosis not present

## 2016-08-25 DIAGNOSIS — R7881 Bacteremia: Secondary | ICD-10-CM | POA: Diagnosis not present

## 2016-08-25 DIAGNOSIS — Z9889 Other specified postprocedural states: Secondary | ICD-10-CM | POA: Diagnosis not present

## 2016-08-25 DIAGNOSIS — D899 Disorder involving the immune mechanism, unspecified: Secondary | ICD-10-CM | POA: Diagnosis not present

## 2016-08-25 DIAGNOSIS — R768 Other specified abnormal immunological findings in serum: Secondary | ICD-10-CM | POA: Diagnosis not present

## 2016-08-25 DIAGNOSIS — J9611 Chronic respiratory failure with hypoxia: Secondary | ICD-10-CM | POA: Diagnosis not present

## 2016-08-25 DIAGNOSIS — J939 Pneumothorax, unspecified: Secondary | ICD-10-CM | POA: Diagnosis not present

## 2016-08-25 DIAGNOSIS — Z4682 Encounter for fitting and adjustment of non-vascular catheter: Secondary | ICD-10-CM | POA: Diagnosis not present

## 2016-08-25 DIAGNOSIS — D62 Acute posthemorrhagic anemia: Secondary | ICD-10-CM | POA: Diagnosis not present

## 2016-08-25 DIAGNOSIS — M351 Other overlap syndromes: Secondary | ICD-10-CM | POA: Diagnosis not present

## 2016-08-25 DIAGNOSIS — J90 Pleural effusion, not elsewhere classified: Secondary | ICD-10-CM | POA: Diagnosis not present

## 2016-08-25 DIAGNOSIS — Z7682 Awaiting organ transplant status: Secondary | ICD-10-CM | POA: Diagnosis not present

## 2016-08-25 DIAGNOSIS — R1312 Dysphagia, oropharyngeal phase: Secondary | ICD-10-CM | POA: Diagnosis not present

## 2016-08-25 DIAGNOSIS — M3489 Other systemic sclerosis: Secondary | ICD-10-CM | POA: Diagnosis not present

## 2016-08-25 DIAGNOSIS — L940 Localized scleroderma [morphea]: Secondary | ICD-10-CM | POA: Diagnosis not present

## 2016-08-25 DIAGNOSIS — J9811 Atelectasis: Secondary | ICD-10-CM | POA: Diagnosis not present

## 2016-08-25 DIAGNOSIS — Z452 Encounter for adjustment and management of vascular access device: Secondary | ICD-10-CM | POA: Diagnosis not present

## 2016-08-25 DIAGNOSIS — R918 Other nonspecific abnormal finding of lung field: Secondary | ICD-10-CM | POA: Diagnosis not present

## 2016-08-25 DIAGNOSIS — R0902 Hypoxemia: Secondary | ICD-10-CM | POA: Diagnosis not present

## 2016-08-25 DIAGNOSIS — J982 Interstitial emphysema: Secondary | ICD-10-CM | POA: Diagnosis not present

## 2016-08-25 DIAGNOSIS — Z789 Other specified health status: Secondary | ICD-10-CM | POA: Diagnosis not present

## 2016-08-25 DIAGNOSIS — G8918 Other acute postprocedural pain: Secondary | ICD-10-CM | POA: Diagnosis not present

## 2016-08-25 DIAGNOSIS — Z942 Lung transplant status: Secondary | ICD-10-CM | POA: Diagnosis not present

## 2016-08-25 DIAGNOSIS — J84112 Idiopathic pulmonary fibrosis: Secondary | ICD-10-CM | POA: Diagnosis not present

## 2016-08-25 DIAGNOSIS — J8410 Pulmonary fibrosis, unspecified: Secondary | ICD-10-CM | POA: Diagnosis not present

## 2016-08-25 DIAGNOSIS — M25561 Pain in right knee: Secondary | ICD-10-CM | POA: Diagnosis not present

## 2016-08-25 DIAGNOSIS — J387 Other diseases of larynx: Secondary | ICD-10-CM | POA: Diagnosis not present

## 2016-08-26 DIAGNOSIS — J84112 Idiopathic pulmonary fibrosis: Secondary | ICD-10-CM | POA: Diagnosis not present

## 2016-08-26 DIAGNOSIS — Z942 Lung transplant status: Secondary | ICD-10-CM | POA: Insufficient documentation

## 2016-08-26 DIAGNOSIS — M349 Systemic sclerosis, unspecified: Secondary | ICD-10-CM | POA: Diagnosis not present

## 2016-08-28 DIAGNOSIS — D899 Disorder involving the immune mechanism, unspecified: Secondary | ICD-10-CM

## 2016-08-28 DIAGNOSIS — D849 Immunodeficiency, unspecified: Secondary | ICD-10-CM | POA: Insufficient documentation

## 2016-08-29 ENCOUNTER — Other Ambulatory Visit: Payer: Self-pay | Admitting: Family Medicine

## 2016-09-01 ENCOUNTER — Telehealth: Payer: Self-pay | Admitting: Pulmonary Disease

## 2016-09-01 NOTE — Telephone Encounter (Signed)
lmtcb X1 for pt  

## 2016-09-02 NOTE — Telephone Encounter (Signed)
lmtcb X2 for pt.  

## 2016-09-04 NOTE — Telephone Encounter (Signed)
Called and spoke with pt and she stated that she already had the lung transplant on 3/16.  She stated that she is still in the hospital but is doing great.  Wanted to call and let BQ know.  Nothing further is needed.

## 2016-09-04 NOTE — Telephone Encounter (Signed)
I called her to talk to her about it

## 2016-09-20 DIAGNOSIS — J84112 Idiopathic pulmonary fibrosis: Secondary | ICD-10-CM | POA: Diagnosis not present

## 2016-09-22 DIAGNOSIS — R262 Difficulty in walking, not elsewhere classified: Secondary | ICD-10-CM | POA: Diagnosis not present

## 2016-09-23 DIAGNOSIS — R262 Difficulty in walking, not elsewhere classified: Secondary | ICD-10-CM | POA: Diagnosis not present

## 2016-09-24 ENCOUNTER — Ambulatory Visit: Payer: 59 | Admitting: Family Medicine

## 2016-09-25 DIAGNOSIS — N183 Chronic kidney disease, stage 3 (moderate): Secondary | ICD-10-CM | POA: Diagnosis not present

## 2016-09-25 DIAGNOSIS — D849 Immunodeficiency, unspecified: Secondary | ICD-10-CM | POA: Diagnosis not present

## 2016-09-25 DIAGNOSIS — Z4824 Encounter for aftercare following lung transplant: Secondary | ICD-10-CM | POA: Diagnosis not present

## 2016-09-25 DIAGNOSIS — R918 Other nonspecific abnormal finding of lung field: Secondary | ICD-10-CM | POA: Diagnosis not present

## 2016-09-25 DIAGNOSIS — Z942 Lung transplant status: Secondary | ICD-10-CM | POA: Diagnosis not present

## 2016-09-25 DIAGNOSIS — I129 Hypertensive chronic kidney disease with stage 1 through stage 4 chronic kidney disease, or unspecified chronic kidney disease: Secondary | ICD-10-CM | POA: Diagnosis not present

## 2016-09-27 DIAGNOSIS — R0902 Hypoxemia: Secondary | ICD-10-CM | POA: Diagnosis not present

## 2016-09-27 DIAGNOSIS — J9610 Chronic respiratory failure, unspecified whether with hypoxia or hypercapnia: Secondary | ICD-10-CM | POA: Diagnosis not present

## 2016-09-27 DIAGNOSIS — J8410 Pulmonary fibrosis, unspecified: Secondary | ICD-10-CM | POA: Diagnosis not present

## 2016-09-29 DIAGNOSIS — Z4824 Encounter for aftercare following lung transplant: Secondary | ICD-10-CM | POA: Diagnosis not present

## 2016-09-29 DIAGNOSIS — Z79899 Other long term (current) drug therapy: Secondary | ICD-10-CM | POA: Diagnosis not present

## 2016-09-29 DIAGNOSIS — Z48298 Encounter for aftercare following other organ transplant: Secondary | ICD-10-CM | POA: Diagnosis not present

## 2016-09-29 DIAGNOSIS — R918 Other nonspecific abnormal finding of lung field: Secondary | ICD-10-CM | POA: Diagnosis not present

## 2016-09-29 DIAGNOSIS — Z942 Lung transplant status: Secondary | ICD-10-CM | POA: Diagnosis not present

## 2016-09-30 DIAGNOSIS — R112 Nausea with vomiting, unspecified: Secondary | ICD-10-CM | POA: Diagnosis not present

## 2016-09-30 DIAGNOSIS — R918 Other nonspecific abnormal finding of lung field: Secondary | ICD-10-CM | POA: Diagnosis not present

## 2016-09-30 DIAGNOSIS — D899 Disorder involving the immune mechanism, unspecified: Secondary | ICD-10-CM | POA: Diagnosis not present

## 2016-09-30 DIAGNOSIS — R509 Fever, unspecified: Secondary | ICD-10-CM | POA: Diagnosis not present

## 2016-09-30 DIAGNOSIS — J982 Interstitial emphysema: Secondary | ICD-10-CM | POA: Diagnosis not present

## 2016-09-30 DIAGNOSIS — Z942 Lung transplant status: Secondary | ICD-10-CM | POA: Diagnosis not present

## 2016-09-30 DIAGNOSIS — R1032 Left lower quadrant pain: Secondary | ICD-10-CM | POA: Diagnosis not present

## 2016-09-30 DIAGNOSIS — K5790 Diverticulosis of intestine, part unspecified, without perforation or abscess without bleeding: Secondary | ICD-10-CM | POA: Diagnosis not present

## 2016-09-30 DIAGNOSIS — R1012 Left upper quadrant pain: Secondary | ICD-10-CM | POA: Diagnosis not present

## 2016-10-03 ENCOUNTER — Telehealth: Payer: Self-pay | Admitting: Pulmonary Disease

## 2016-10-03 DIAGNOSIS — Z8709 Personal history of other diseases of the respiratory system: Secondary | ICD-10-CM | POA: Diagnosis not present

## 2016-10-03 DIAGNOSIS — Z4824 Encounter for aftercare following lung transplant: Secondary | ICD-10-CM | POA: Diagnosis not present

## 2016-10-03 DIAGNOSIS — Z942 Lung transplant status: Secondary | ICD-10-CM | POA: Diagnosis not present

## 2016-10-03 DIAGNOSIS — K219 Gastro-esophageal reflux disease without esophagitis: Secondary | ICD-10-CM | POA: Diagnosis not present

## 2016-10-03 NOTE — Telephone Encounter (Signed)
Called and spoke with pt and she stated that she is doing great.  She is needing BQ to send in an order to [place] to have the oxygen picked up.  BQ she is aware that you will be out of the office.  Please advise. thanks

## 2016-10-06 DIAGNOSIS — R262 Difficulty in walking, not elsewhere classified: Secondary | ICD-10-CM | POA: Diagnosis not present

## 2016-10-09 DIAGNOSIS — R0902 Hypoxemia: Secondary | ICD-10-CM | POA: Diagnosis not present

## 2016-10-09 DIAGNOSIS — J9610 Chronic respiratory failure, unspecified whether with hypoxia or hypercapnia: Secondary | ICD-10-CM | POA: Diagnosis not present

## 2016-10-09 DIAGNOSIS — R112 Nausea with vomiting, unspecified: Secondary | ICD-10-CM | POA: Diagnosis not present

## 2016-10-09 DIAGNOSIS — D849 Immunodeficiency, unspecified: Secondary | ICD-10-CM | POA: Diagnosis not present

## 2016-10-09 DIAGNOSIS — J8410 Pulmonary fibrosis, unspecified: Secondary | ICD-10-CM | POA: Diagnosis not present

## 2016-10-09 DIAGNOSIS — Z942 Lung transplant status: Secondary | ICD-10-CM | POA: Diagnosis not present

## 2016-10-09 DIAGNOSIS — I129 Hypertensive chronic kidney disease with stage 1 through stage 4 chronic kidney disease, or unspecified chronic kidney disease: Secondary | ICD-10-CM | POA: Diagnosis not present

## 2016-10-09 DIAGNOSIS — Z4824 Encounter for aftercare following lung transplant: Secondary | ICD-10-CM | POA: Diagnosis not present

## 2016-10-09 DIAGNOSIS — N183 Chronic kidney disease, stage 3 (moderate): Secondary | ICD-10-CM | POA: Diagnosis not present

## 2016-10-09 DIAGNOSIS — R918 Other nonspecific abnormal finding of lung field: Secondary | ICD-10-CM | POA: Diagnosis not present

## 2016-10-13 DIAGNOSIS — R262 Difficulty in walking, not elsewhere classified: Secondary | ICD-10-CM | POA: Diagnosis not present

## 2016-10-13 NOTE — Telephone Encounter (Signed)
Please have her make sure the transplant coordinators at [facility] are [place] with this first.  If so then send order to pick up oxygen.

## 2016-10-13 NOTE — Telephone Encounter (Signed)
Called spoke with patient, discussed BQ's recommendations as stated below Pt okay with this and voiced her understanding - she will e-mail her transplant coordinator and let us know Will sign off on this message and await pt's return call

## 2016-10-14 DIAGNOSIS — R262 Difficulty in walking, not elsewhere classified: Secondary | ICD-10-CM | POA: Diagnosis not present

## 2016-10-16 DIAGNOSIS — R918 Other nonspecific abnormal finding of lung field: Secondary | ICD-10-CM | POA: Diagnosis not present

## 2016-10-16 DIAGNOSIS — Z942 Lung transplant status: Secondary | ICD-10-CM | POA: Diagnosis not present

## 2016-10-16 DIAGNOSIS — D849 Immunodeficiency, unspecified: Secondary | ICD-10-CM | POA: Diagnosis not present

## 2016-10-16 DIAGNOSIS — Z4824 Encounter for aftercare following lung transplant: Secondary | ICD-10-CM | POA: Diagnosis not present

## 2016-10-16 DIAGNOSIS — I129 Hypertensive chronic kidney disease with stage 1 through stage 4 chronic kidney disease, or unspecified chronic kidney disease: Secondary | ICD-10-CM | POA: Diagnosis not present

## 2016-10-17 DIAGNOSIS — R262 Difficulty in walking, not elsewhere classified: Secondary | ICD-10-CM | POA: Diagnosis not present

## 2016-10-21 DIAGNOSIS — R262 Difficulty in walking, not elsewhere classified: Secondary | ICD-10-CM | POA: Diagnosis not present

## 2016-10-21 DIAGNOSIS — Z4889 Encounter for other specified surgical aftercare: Secondary | ICD-10-CM | POA: Diagnosis not present

## 2016-10-23 DIAGNOSIS — Z4682 Encounter for fitting and adjustment of non-vascular catheter: Secondary | ICD-10-CM | POA: Diagnosis not present

## 2016-10-23 DIAGNOSIS — R918 Other nonspecific abnormal finding of lung field: Secondary | ICD-10-CM | POA: Diagnosis not present

## 2016-10-23 DIAGNOSIS — J9622 Acute and chronic respiratory failure with hypercapnia: Secondary | ICD-10-CM | POA: Diagnosis not present

## 2016-10-23 DIAGNOSIS — I959 Hypotension, unspecified: Secondary | ICD-10-CM | POA: Diagnosis not present

## 2016-10-23 DIAGNOSIS — D849 Immunodeficiency, unspecified: Secondary | ICD-10-CM | POA: Diagnosis not present

## 2016-10-23 DIAGNOSIS — R1110 Vomiting, unspecified: Secondary | ICD-10-CM | POA: Diagnosis not present

## 2016-10-23 DIAGNOSIS — J9601 Acute respiratory failure with hypoxia: Secondary | ICD-10-CM | POA: Diagnosis not present

## 2016-10-23 DIAGNOSIS — M6250 Muscle wasting and atrophy, not elsewhere classified, unspecified site: Secondary | ICD-10-CM | POA: Diagnosis not present

## 2016-10-23 DIAGNOSIS — M79602 Pain in left arm: Secondary | ICD-10-CM | POA: Diagnosis not present

## 2016-10-23 DIAGNOSIS — R6510 Systemic inflammatory response syndrome (SIRS) of non-infectious origin without acute organ dysfunction: Secondary | ICD-10-CM | POA: Diagnosis not present

## 2016-10-23 DIAGNOSIS — R112 Nausea with vomiting, unspecified: Secondary | ICD-10-CM | POA: Diagnosis not present

## 2016-10-23 DIAGNOSIS — N179 Acute kidney failure, unspecified: Secondary | ICD-10-CM | POA: Diagnosis not present

## 2016-10-23 DIAGNOSIS — J9610 Chronic respiratory failure, unspecified whether with hypoxia or hypercapnia: Secondary | ICD-10-CM | POA: Diagnosis not present

## 2016-10-23 DIAGNOSIS — R000 Tachycardia, unspecified: Secondary | ICD-10-CM | POA: Diagnosis not present

## 2016-10-23 DIAGNOSIS — J9602 Acute respiratory failure with hypercapnia: Secondary | ICD-10-CM | POA: Diagnosis not present

## 2016-10-23 DIAGNOSIS — J9600 Acute respiratory failure, unspecified whether with hypoxia or hypercapnia: Secondary | ICD-10-CM | POA: Diagnosis not present

## 2016-10-23 DIAGNOSIS — T86818 Other complications of lung transplant: Secondary | ICD-10-CM | POA: Diagnosis not present

## 2016-10-23 DIAGNOSIS — Z452 Encounter for adjustment and management of vascular access device: Secondary | ICD-10-CM | POA: Diagnosis not present

## 2016-10-23 DIAGNOSIS — R262 Difficulty in walking, not elsewhere classified: Secondary | ICD-10-CM | POA: Diagnosis not present

## 2016-10-23 DIAGNOSIS — Z942 Lung transplant status: Secondary | ICD-10-CM | POA: Diagnosis not present

## 2016-10-23 DIAGNOSIS — J9621 Acute and chronic respiratory failure with hypoxia: Secondary | ICD-10-CM | POA: Diagnosis not present

## 2016-10-23 DIAGNOSIS — R0902 Hypoxemia: Secondary | ICD-10-CM | POA: Diagnosis not present

## 2016-10-23 DIAGNOSIS — R509 Fever, unspecified: Secondary | ICD-10-CM | POA: Diagnosis not present

## 2016-10-23 DIAGNOSIS — K219 Gastro-esophageal reflux disease without esophagitis: Secondary | ICD-10-CM | POA: Diagnosis not present

## 2016-10-23 DIAGNOSIS — T86819 Unspecified complication of lung transplant: Secondary | ICD-10-CM | POA: Diagnosis not present

## 2016-10-23 DIAGNOSIS — R531 Weakness: Secondary | ICD-10-CM | POA: Diagnosis not present

## 2016-10-23 DIAGNOSIS — J90 Pleural effusion, not elsewhere classified: Secondary | ICD-10-CM | POA: Diagnosis not present

## 2016-10-23 DIAGNOSIS — J8410 Pulmonary fibrosis, unspecified: Secondary | ICD-10-CM | POA: Diagnosis not present

## 2016-10-26 DIAGNOSIS — T86818 Other complications of lung transplant: Secondary | ICD-10-CM | POA: Diagnosis not present

## 2016-10-28 ENCOUNTER — Other Ambulatory Visit: Payer: Self-pay | Admitting: Pulmonary Disease

## 2016-10-28 ENCOUNTER — Other Ambulatory Visit: Payer: Self-pay | Admitting: Family Medicine

## 2016-10-28 DIAGNOSIS — I10 Essential (primary) hypertension: Secondary | ICD-10-CM

## 2016-10-28 NOTE — Telephone Encounter (Signed)
Patient requesting refill of Dilt[facility]em to [facility].  

## 2016-11-02 NOTE — Telephone Encounter (Signed)
Refilled exactly as primary provider has been writing it

## 2016-11-07 DIAGNOSIS — G43909 Migraine, unspecified, not intractable, without status migrainosus: Secondary | ICD-10-CM | POA: Diagnosis not present

## 2016-11-07 DIAGNOSIS — M349 Systemic sclerosis, unspecified: Secondary | ICD-10-CM | POA: Diagnosis not present

## 2016-11-07 DIAGNOSIS — N183 Chronic kidney disease, stage 3 (moderate): Secondary | ICD-10-CM | POA: Diagnosis not present

## 2016-11-07 DIAGNOSIS — E872 Acidosis: Secondary | ICD-10-CM | POA: Diagnosis not present

## 2016-11-07 DIAGNOSIS — J8410 Pulmonary fibrosis, unspecified: Secondary | ICD-10-CM | POA: Diagnosis not present

## 2016-11-07 DIAGNOSIS — Z942 Lung transplant status: Secondary | ICD-10-CM | POA: Diagnosis not present

## 2016-11-07 DIAGNOSIS — I129 Hypertensive chronic kidney disease with stage 1 through stage 4 chronic kidney disease, or unspecified chronic kidney disease: Secondary | ICD-10-CM | POA: Diagnosis not present

## 2016-11-07 DIAGNOSIS — I7300 Raynaud's syndrome without gangrene: Secondary | ICD-10-CM | POA: Diagnosis not present

## 2016-11-07 DIAGNOSIS — D500 Iron deficiency anemia secondary to blood loss (chronic): Secondary | ICD-10-CM | POA: Diagnosis not present

## 2016-11-07 DIAGNOSIS — R0902 Hypoxemia: Secondary | ICD-10-CM | POA: Diagnosis not present

## 2016-11-07 DIAGNOSIS — K219 Gastro-esophageal reflux disease without esophagitis: Secondary | ICD-10-CM | POA: Diagnosis not present

## 2016-11-07 DIAGNOSIS — R110 Nausea: Secondary | ICD-10-CM | POA: Diagnosis not present

## 2016-11-07 DIAGNOSIS — T86818 Other complications of lung transplant: Secondary | ICD-10-CM | POA: Diagnosis not present

## 2016-11-07 DIAGNOSIS — T86810 Lung transplant rejection: Secondary | ICD-10-CM | POA: Diagnosis not present

## 2016-11-07 DIAGNOSIS — R509 Fever, unspecified: Secondary | ICD-10-CM | POA: Diagnosis not present

## 2016-11-07 DIAGNOSIS — J9811 Atelectasis: Secondary | ICD-10-CM | POA: Diagnosis not present

## 2016-11-07 DIAGNOSIS — J9600 Acute respiratory failure, unspecified whether with hypoxia or hypercapnia: Secondary | ICD-10-CM | POA: Diagnosis not present

## 2016-11-07 DIAGNOSIS — R531 Weakness: Secondary | ICD-10-CM | POA: Diagnosis not present

## 2016-11-07 DIAGNOSIS — J9610 Chronic respiratory failure, unspecified whether with hypoxia or hypercapnia: Secondary | ICD-10-CM | POA: Diagnosis not present

## 2016-11-07 DIAGNOSIS — M3481 Systemic sclerosis with lung involvement: Secondary | ICD-10-CM | POA: Diagnosis not present

## 2016-11-07 DIAGNOSIS — M351 Other overlap syndromes: Secondary | ICD-10-CM | POA: Diagnosis not present

## 2016-11-07 DIAGNOSIS — Z79899 Other long term (current) drug therapy: Secondary | ICD-10-CM | POA: Diagnosis not present

## 2016-11-07 DIAGNOSIS — Z4824 Encounter for aftercare following lung transplant: Secondary | ICD-10-CM | POA: Diagnosis not present

## 2016-11-07 DIAGNOSIS — D849 Immunodeficiency, unspecified: Secondary | ICD-10-CM | POA: Diagnosis not present

## 2016-11-07 DIAGNOSIS — T86819 Unspecified complication of lung transplant: Secondary | ICD-10-CM | POA: Diagnosis not present

## 2016-11-07 DIAGNOSIS — M6250 Muscle wasting and atrophy, not elsewhere classified, unspecified site: Secondary | ICD-10-CM | POA: Diagnosis not present

## 2016-11-10 DIAGNOSIS — J9600 Acute respiratory failure, unspecified whether with hypoxia or hypercapnia: Secondary | ICD-10-CM | POA: Diagnosis not present

## 2016-11-10 DIAGNOSIS — R531 Weakness: Secondary | ICD-10-CM | POA: Diagnosis not present

## 2016-11-10 DIAGNOSIS — R0902 Hypoxemia: Secondary | ICD-10-CM | POA: Diagnosis not present

## 2016-11-13 DIAGNOSIS — D849 Immunodeficiency, unspecified: Secondary | ICD-10-CM | POA: Diagnosis not present

## 2016-11-13 DIAGNOSIS — N183 Chronic kidney disease, stage 3 unspecified: Secondary | ICD-10-CM | POA: Insufficient documentation

## 2016-11-13 DIAGNOSIS — I129 Hypertensive chronic kidney disease with stage 1 through stage 4 chronic kidney disease, or unspecified chronic kidney disease: Secondary | ICD-10-CM | POA: Diagnosis not present

## 2016-11-13 DIAGNOSIS — Z79899 Other long term (current) drug therapy: Secondary | ICD-10-CM | POA: Diagnosis not present

## 2016-11-13 DIAGNOSIS — Z942 Lung transplant status: Secondary | ICD-10-CM | POA: Diagnosis not present

## 2016-11-13 DIAGNOSIS — J9811 Atelectasis: Secondary | ICD-10-CM | POA: Diagnosis not present

## 2016-11-13 DIAGNOSIS — T86818 Other complications of lung transplant: Secondary | ICD-10-CM | POA: Diagnosis not present

## 2016-11-18 DIAGNOSIS — G43909 Migraine, unspecified, not intractable, without status migrainosus: Secondary | ICD-10-CM | POA: Diagnosis not present

## 2016-11-18 DIAGNOSIS — R110 Nausea: Secondary | ICD-10-CM | POA: Diagnosis not present

## 2016-11-24 DIAGNOSIS — M349 Systemic sclerosis, unspecified: Secondary | ICD-10-CM | POA: Diagnosis not present

## 2016-11-24 DIAGNOSIS — T86810 Lung transplant rejection: Secondary | ICD-10-CM | POA: Diagnosis not present

## 2016-11-25 ENCOUNTER — Telehealth: Payer: Self-pay | Admitting: Pulmonary Disease

## 2016-11-25 DIAGNOSIS — Z4824 Encounter for aftercare following lung transplant: Secondary | ICD-10-CM | POA: Diagnosis not present

## 2016-11-25 DIAGNOSIS — T86810 Lung transplant rejection: Secondary | ICD-10-CM | POA: Diagnosis not present

## 2016-11-25 DIAGNOSIS — N183 Chronic kidney disease, stage 3 (moderate): Secondary | ICD-10-CM | POA: Diagnosis not present

## 2016-11-25 DIAGNOSIS — M351 Other overlap syndromes: Secondary | ICD-10-CM | POA: Diagnosis not present

## 2016-11-25 DIAGNOSIS — J8410 Pulmonary fibrosis, unspecified: Secondary | ICD-10-CM

## 2016-11-25 DIAGNOSIS — D849 Immunodeficiency, unspecified: Secondary | ICD-10-CM | POA: Diagnosis not present

## 2016-11-25 DIAGNOSIS — Z942 Lung transplant status: Secondary | ICD-10-CM | POA: Diagnosis not present

## 2016-11-25 DIAGNOSIS — J9611 Chronic respiratory failure with hypoxia: Secondary | ICD-10-CM

## 2016-11-25 DIAGNOSIS — I129 Hypertensive chronic kidney disease with stage 1 through stage 4 chronic kidney disease, or unspecified chronic kidney disease: Secondary | ICD-10-CM | POA: Diagnosis not present

## 2016-11-25 DIAGNOSIS — Z79899 Other long term (current) drug therapy: Secondary | ICD-10-CM | POA: Diagnosis not present

## 2016-11-25 NOTE — Telephone Encounter (Signed)
Called and spoke with and she stated that she has been waiting for [facility] to come and pick up the tanks that she has that she is not using and getting billed for them.  She stated that she did speak with the [place] coordinators and they agree with her not needing the oxygen and she would like to have these picked up.  I have sent the order in for [place] and she is aware.

## 2016-11-27 DIAGNOSIS — T8691 Unspecified transplanted organ and tissue rejection: Secondary | ICD-10-CM | POA: Diagnosis not present

## 2016-11-27 DIAGNOSIS — R0902 Hypoxemia: Secondary | ICD-10-CM | POA: Diagnosis not present

## 2016-11-27 DIAGNOSIS — N182 Chronic kidney disease, stage 2 (mild): Secondary | ICD-10-CM | POA: Diagnosis not present

## 2016-11-27 DIAGNOSIS — J9610 Chronic respiratory failure, unspecified whether with hypoxia or hypercapnia: Secondary | ICD-10-CM | POA: Diagnosis not present

## 2016-11-27 DIAGNOSIS — J8410 Pulmonary fibrosis, unspecified: Secondary | ICD-10-CM | POA: Diagnosis not present

## 2016-11-27 DIAGNOSIS — Z942 Lung transplant status: Secondary | ICD-10-CM | POA: Diagnosis not present

## 2016-11-28 DIAGNOSIS — T8691 Unspecified transplanted organ and tissue rejection: Secondary | ICD-10-CM | POA: Diagnosis not present

## 2016-11-28 DIAGNOSIS — Z942 Lung transplant status: Secondary | ICD-10-CM | POA: Diagnosis not present

## 2016-11-28 DIAGNOSIS — N182 Chronic kidney disease, stage 2 (mild): Secondary | ICD-10-CM | POA: Diagnosis not present

## 2016-12-01 ENCOUNTER — Telehealth: Payer: Self-pay | Admitting: Family Medicine

## 2016-12-01 ENCOUNTER — Telehealth: Payer: Self-pay

## 2016-12-01 NOTE — Telephone Encounter (Signed)
Done

## 2016-12-01 NOTE — Telephone Encounter (Signed)
Judeen Hammans from [facility] requesting a verbal for skilled nursing services  Twice a week for three weeks [phone]

## 2016-12-01 NOTE — Telephone Encounter (Signed)
Evaluation for PT at home and recommends 2x a week for 3 weeks and 1x for 2 week. To help her with balance and coordination.

## 2016-12-01 NOTE — Telephone Encounter (Signed)
Please give verbal order.

## 2016-12-02 ENCOUNTER — Encounter: Payer: Self-pay | Admitting: Family Medicine

## 2016-12-02 ENCOUNTER — Ambulatory Visit (INDEPENDENT_AMBULATORY_CARE_PROVIDER_SITE_OTHER): Payer: 59 | Admitting: Family Medicine

## 2016-12-02 VITALS — BP 124/58 | HR 101 | Temp 97.7°F | Resp 20 | Ht 61.0 in | Wt 111.9 lb

## 2016-12-02 DIAGNOSIS — I471 Supraventricular tachycardia: Secondary | ICD-10-CM

## 2016-12-02 DIAGNOSIS — D899 Disorder involving the immune mechanism, unspecified: Secondary | ICD-10-CM | POA: Diagnosis not present

## 2016-12-02 DIAGNOSIS — M349 Systemic sclerosis, unspecified: Secondary | ICD-10-CM

## 2016-12-02 DIAGNOSIS — Z942 Lung transplant status: Secondary | ICD-10-CM

## 2016-12-02 DIAGNOSIS — T8691 Unspecified transplanted organ and tissue rejection: Secondary | ICD-10-CM | POA: Diagnosis not present

## 2016-12-02 DIAGNOSIS — F320 Major depressive disorder, single episode, mild: Secondary | ICD-10-CM

## 2016-12-02 DIAGNOSIS — N182 Chronic kidney disease, stage 2 (mild): Secondary | ICD-10-CM | POA: Diagnosis not present

## 2016-12-02 DIAGNOSIS — D849 Immunodeficiency, unspecified: Secondary | ICD-10-CM

## 2016-12-02 MED ORDER — AZATHIOPRINE 50 MG PO TABS: 50.0000 mg | ORAL_TABLET | Freq: Every day | ORAL | 0 refills | Status: AC

## 2016-12-02 NOTE — Progress Notes (Signed)
Name: Erika Martinez   MRN: [id number]    DOB: Sep 29, 1955   Date:12/02/2016       Progress Note  Subjective  Chief Complaint  Chief Complaint  Patient presents with  . Paperwork    Work paperwork had transplant August 26, 2016 at [facility]     HPI  S/p lung transplant/scleroderma: it was on March 20th, 2018 and she had a complication - inability to eat and drink, she was vomiting her medications and it was causing some lung rejection - she  was re-admitted 10/22/2016, she was given fluids, antibiotics. She is still feeling weak, using walker, but doing better and will have in home PT. No longer using oxygen, states coughs very seldom, compliant with medication  Mild depression: she is on Zyprexa to help her sleep and appetite, started by psychiatrist during last admission, she has a follow up with psychiatrist, since started on transplant program  SVT: since last admission she stopped taking Metoprolol, but she has SVT and advised her to resume medication.    Patient Active Problem List   Diagnosis Date Noted  . Immunosuppressed status ([place]) 12/02/2016  . S/P lung transplant ([place]) 08/26/2016  . Raynaud disease 11/26/2015  . Decreased GFR 07/24/2015  . Mild major depression ([place]) 06/07/2015  . Hypertension, benign 04/06/2015  . History of hepatitis C 02/02/2015  . Insomnia 02/02/2015  . Gastro-esophageal reflux disease without esophagitis 02/02/2015  . IBS (irritable bowel syndrome) 02/02/2015  . Low serum cobalamin 02/02/2015  . Perennial allergic rhinitis with seasonal variation 02/02/2015  . Scleroderma ([place]) 02/02/2015  . SPL (spondylolisthesis) 02/02/2015  . Supraventricular tachycardia ([place]) 02/02/2015  . Vitamin D deficiency 02/02/2015  . Anxiety 02/02/2015  . History of pneumococcal pneumonia 09/13/2013  . Polycythemia, secondary 09/13/2013    Past Surgical History:  Procedure Laterality Date  . ABDOMINAL HYSTERECTOMY    . VAGINAL HYSTERECTOMY      Family History   Problem Relation Age of Onset  . Lung cancer Father   . Cancer Father   . Alzheimer's disease Mother   . Breast cancer Neg Hx     Social History   Social History  . Marital status: Divorced    Spouse name: N/A  . Number of children: N/A  . Years of education: N/A   Occupational History  . Not on file.   Social History Main Topics  . Smoking status: Never Smoker  . Smokeless tobacco: Never Used  . Alcohol use 2.0 oz/week    4 Standard drinks or equivalent per week     Comment: "social drinker"  . Drug use: No  . Sexual activity: Not on file   Other Topics Concern  . Not on file   Social History Narrative  . No narrative on file     Current Outpatient Prescriptions:  .  acetaminophen (TYLENOL) 500 MG tablet, Take 1 tablet by mouth every 4 (four) hours as needed., Disp: , Rfl:  .  albuterol (PROVENTIL) (2.5 MG/3ML) 0.083% nebulizer solution, Take 3 mLs (2.5 mg total) by nebulization every 4 (four) hours as needed for wheezing or shortness of breath. DX Code: J84.10, Disp: 75 mL, Rfl: 12 .  aspirin 81 MG chewable tablet, Chew 1 tablet by mouth daily., Disp: , Rfl:  .  azaTHIOprine (IMURAN) 50 MG tablet, Take 1 tablet (50 mg total) by mouth daily., Disp: 60 tablet, Rfl: 0 .  Calcium Citrate-Vitamin D (CALCIUM CITRATE+D3 PETITES) 200-250 MG-UNIT TABS, Take 3 tablets by mouth 2 (two) times  daily., Disp: , Rfl:  .  cholecalciferol (VITAMIN D) 1000 UNITS tablet, Take 2,000 Units by mouth daily., Disp: , Rfl:  .  Ferrous Sulfate (IRON) 325 (65 Fe) MG TABS, Take 1 tablet by mouth daily., Disp: , Rfl:  .  fluconazole (DIFLUCAN) 150 MG tablet, TAKE 1 TABLET WEEKLY AS NEEDED FOR THRUSH, Disp: 4 tablet, Rfl: 2 .  fluticasone (FLONASE) 50 MCG/ACT nasal spray, Place 2 sprays into both nostrils daily., Disp: 16 g, Rfl: 6 .  Immune Globulin, Human, 2.5 GM/25ML SOLN, Inject into the vein., Disp: , Rfl:  .  loratadine (CLARITIN) 10 MG tablet, TAKE 1 TABLET (10 MG TOTAL) BY MOUTH 2 (TWO)  TIMES DAILY., Disp: 60 tablet, Rfl: 3 .  magnesium oxide (MAG-OX) 400 MG tablet, Take 1 tablet by mouth daily., Disp: , Rfl:  .  metoprolol tartrate (LOPRESSOR) 25 MG tablet, Take 0.5 tablets by mouth 2 (two) times daily., Disp: , Rfl: 11 .  Multiple Vitamin (MULTI-VITAMINS) TABS, Take 1 tablet by mouth daily., Disp: , Rfl:  .  OLANZapine (ZYPREXA) 5 MG tablet, Take 1 tablet by mouth daily., Disp: , Rfl: 0 .  Omeprazole 20 MG TBEC, Take 1 tablet by mouth daily., Disp: , Rfl:  .  ondansetron (ZOFRAN) 4 MG tablet, Take 1 tablet by mouth daily., Disp: , Rfl:  .  oxyCODONE (OXY IR/ROXICODONE) 5 MG immediate release tablet, Take 1 tablet by mouth as needed., Disp: , Rfl: 0 .  predniSONE (DELTASONE) 5 MG tablet, Take 4 tablets by mouth daily., Disp: , Rfl:  .  Skin Protectants, Misc. (EUCERIN) cream, Apply topically., Disp: , Rfl:  .  sulfamethoxazole-trimethoprim (BACTRIM DS,SEPTRA DS) 800-160 MG tablet, TAKE 1 TABLET BY MOUTH 3 (THREE) TIMES A WEEK., Disp: , Rfl: 5 .  tacrolimus (PROGRAF) 1 MG capsule, Take 1 capsule by mouth every 12 (twelve) hours., Disp: , Rfl:  .  valGANciclovir (VALCYTE) 450 MG tablet, Take 1 tablet by mouth daily., Disp: , Rfl:  .  VENTOLIN HFA 108 (90 Base) MCG/ACT inhaler, INHALE 2 PUFFS INTO THE LUNGS EVERY 4 (FOUR) HOURS AS NEEDED FOR WHEEZING OR SHORTNESS OF BREATH., Disp: 18 g, Rfl: 5 .  vitamin B-12 (CYANOCOBALAMIN) 250 MCG tablet, Place 250 mcg under the tongue every other day. , Disp: , Rfl:   No Known Allergies   ROS  Constitutional: Negative for fever, positive for weight change.  Respiratory: Positive  for cough ( Seldom ) but no  shortness of breath.   Cardiovascular: Negative for chest pain , positive for  palpitations.  Gastrointestinal: Negative for abdominal pain, no bowel changes.  Musculoskeletal: Positive  for gait problem but no  joint swelling.  Skin: Negative for rash.   Neurological: Negative for dizziness or headache.  No other specific  complaints in a complete review of systems (except as listed in HPI above).  Objective  Vitals:   12/02/16 1024  BP: (!) 124/58  Pulse: (!) 101  Resp: 20  Temp: 97.7 F (36.5 C)  TempSrc: Oral  SpO2: 100%  Weight: 111 lb 14.4 oz (50.8 kg)  Height: 5\' 1"  (1.549 m)    Body mass index is 21.14 kg/m.  Physical Exam  Constitutional: Patient appears well-developed an thin.  No distress.  HEENT: head atraumatic, normocephalic, pupils equal and reactive to light,  neck supple, throat within normal limits Cardiovascular: Normal rate, regular rhythm and normal heart sounds.  No murmur heard. No BLE edema. Pulmonary/Chest: Effort normal and breath sounds normal. No respiratory distress.  Abdominal: Soft.  There is no tenderness. Psychiatric: Patient has a normal mood and affect. behavior is normal. Judgment and thought content normal.  PHQ2/9: Depression screen [facility] 2/9 12/02/2016 06/27/2016 03/26/2016 12/14/2015 10/03/2015  Decreased Interest 0 0 0 1 0  Down, Depressed, Hopeless 0 0 0 1 0  PHQ - 2 Score 0 0 0 2 0  Altered sleeping - - - 1 -  Tired, decreased energy - - - 2 -  Change in appetite - - - 2 -  Feeling bad or failure about yourself  - - - 1 -  Trouble concentrating - - - 0 -  Moving slowly or fidgety/restless - - - 0 -  Suicidal thoughts - - - 0 -  PHQ-9 Score - - - 8 -  Difficult doing work/chores - - - Somewhat difficult -     Fall Risk: Fall Risk  12/02/2016 06/27/2016 03/26/2016 12/14/2015 10/03/2015  Falls in the past year? No No No No No     Functional Status Survey: Is the patient deaf or have difficulty hearing?: No Does the patient have difficulty seeing, even when wearing glasses/contacts?: No Does the patient have difficulty concentrating, remembering, or making decisions?: No Does the patient have difficulty walking or climbing stairs?: Yes Does the patient have difficulty dressing or bathing?: No Does the patient have difficulty doing errands alone such as  visiting a doctor's office or shopping?: Yes    Assessment & Plan  1. S/P lung transplant ([place])  Still weak, using walker, not using oxygen, still needs rehab  2. Immunosuppressed status ([place])  On multiple medications since transplant  3. Scleroderma ([place])  Going to resume Imuran   4. Supraventricular tachycardia ([place])  She has been off Metoprolol since left rehab, but advised her to resume  5. Mild major depression ([place])  Doing well, still frustrated because of weakness, and has to use walker, taking Zyprexa, and seems to help with mood, but off any other anti-depressants.

## 2016-12-03 ENCOUNTER — Telehealth: Payer: Self-pay

## 2016-12-03 DIAGNOSIS — N182 Chronic kidney disease, stage 2 (mild): Secondary | ICD-10-CM | POA: Diagnosis not present

## 2016-12-03 DIAGNOSIS — T8691 Unspecified transplanted organ and tissue rejection: Secondary | ICD-10-CM | POA: Diagnosis not present

## 2016-12-03 DIAGNOSIS — Z942 Lung transplant status: Secondary | ICD-10-CM | POA: Diagnosis not present

## 2016-12-03 NOTE — Telephone Encounter (Signed)
[facility] called needing verbal orders for skilled nursing 2 times a week for 3 weeks [phone] her call back number

## 2016-12-03 NOTE — Telephone Encounter (Signed)
okay

## 2016-12-04 DIAGNOSIS — T8691 Unspecified transplanted organ and tissue rejection: Secondary | ICD-10-CM | POA: Diagnosis not present

## 2016-12-04 DIAGNOSIS — N182 Chronic kidney disease, stage 2 (mild): Secondary | ICD-10-CM | POA: Diagnosis not present

## 2016-12-04 DIAGNOSIS — Z942 Lung transplant status: Secondary | ICD-10-CM | POA: Diagnosis not present

## 2016-12-05 DIAGNOSIS — M79602 Pain in left arm: Secondary | ICD-10-CM | POA: Diagnosis not present

## 2016-12-05 DIAGNOSIS — M349 Systemic sclerosis, unspecified: Secondary | ICD-10-CM | POA: Diagnosis not present

## 2016-12-05 DIAGNOSIS — Z942 Lung transplant status: Secondary | ICD-10-CM | POA: Diagnosis not present

## 2016-12-08 DIAGNOSIS — Z79899 Other long term (current) drug therapy: Secondary | ICD-10-CM | POA: Diagnosis not present

## 2016-12-08 DIAGNOSIS — Z942 Lung transplant status: Secondary | ICD-10-CM | POA: Diagnosis not present

## 2016-12-09 DIAGNOSIS — Z942 Lung transplant status: Secondary | ICD-10-CM | POA: Diagnosis not present

## 2016-12-09 DIAGNOSIS — N182 Chronic kidney disease, stage 2 (mild): Secondary | ICD-10-CM | POA: Diagnosis not present

## 2016-12-09 DIAGNOSIS — T8691 Unspecified transplanted organ and tissue rejection: Secondary | ICD-10-CM | POA: Diagnosis not present

## 2016-12-11 DIAGNOSIS — Z942 Lung transplant status: Secondary | ICD-10-CM | POA: Diagnosis not present

## 2016-12-11 DIAGNOSIS — N182 Chronic kidney disease, stage 2 (mild): Secondary | ICD-10-CM | POA: Diagnosis not present

## 2016-12-11 DIAGNOSIS — T8691 Unspecified transplanted organ and tissue rejection: Secondary | ICD-10-CM | POA: Diagnosis not present

## 2016-12-12 DIAGNOSIS — N182 Chronic kidney disease, stage 2 (mild): Secondary | ICD-10-CM | POA: Diagnosis not present

## 2016-12-12 DIAGNOSIS — Z942 Lung transplant status: Secondary | ICD-10-CM | POA: Diagnosis not present

## 2016-12-12 DIAGNOSIS — T8691 Unspecified transplanted organ and tissue rejection: Secondary | ICD-10-CM | POA: Diagnosis not present

## 2016-12-14 IMAGING — US ABDOMEN ULTRASOUND
1 series · 14 of 25 positions shown · non-contrast
Comparison: 01/31/2005.

CLINICAL DATA: 58-year-old female with hepatitis-C. Initial
encounter.

EXAM:
ULTRASOUND ABDOMEN COMPLETE

[Series 1: abdomen ultrasound · 0.20mm/px · 14 of 89 slices shown]
[im 1/89]
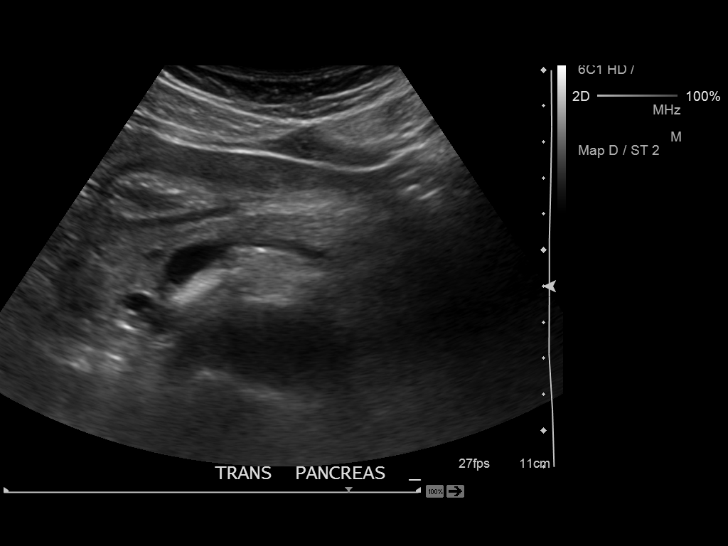
[im 8/89]
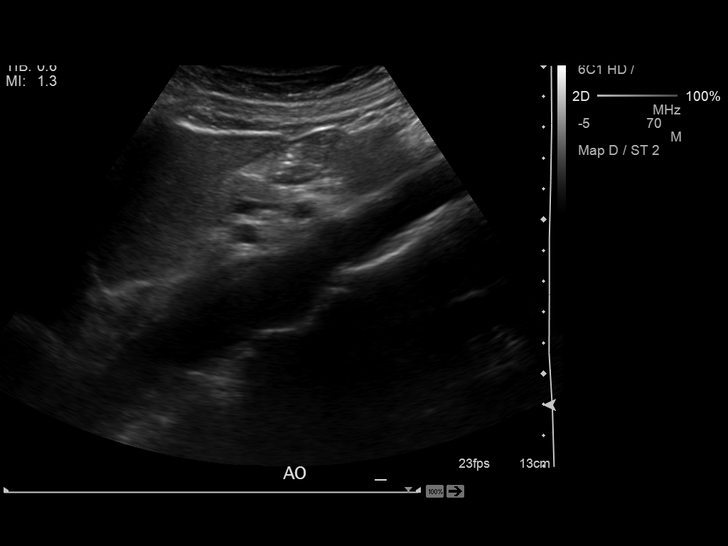
[im 15/89]
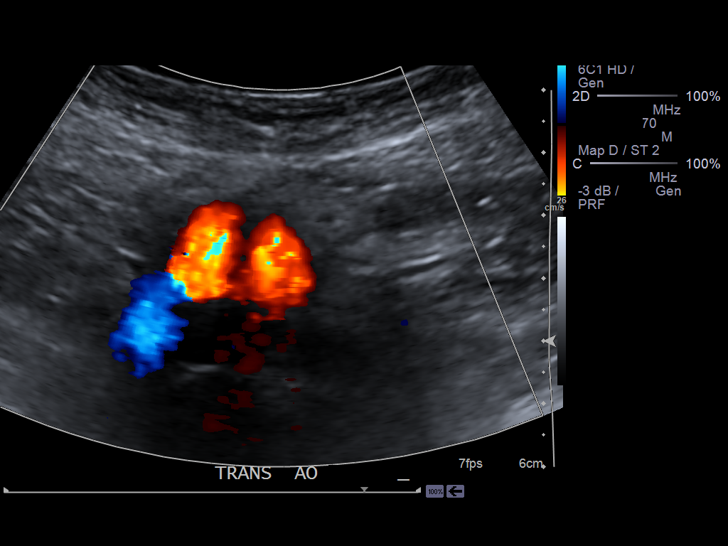
[im 23/89]
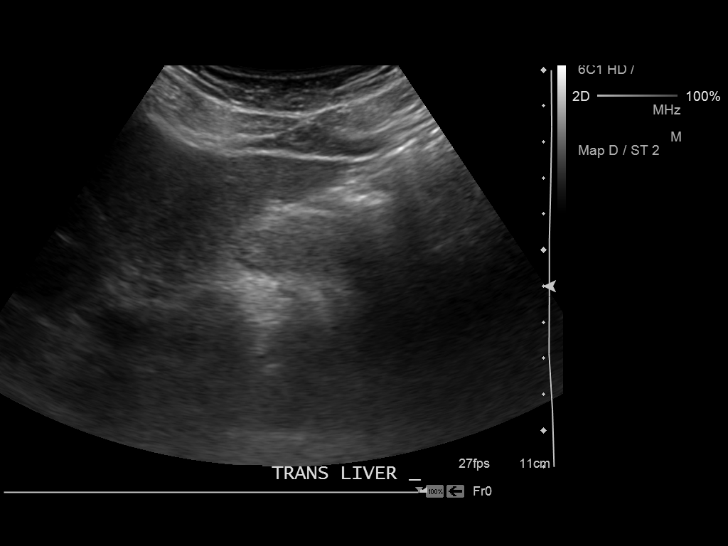
[im 30/89]
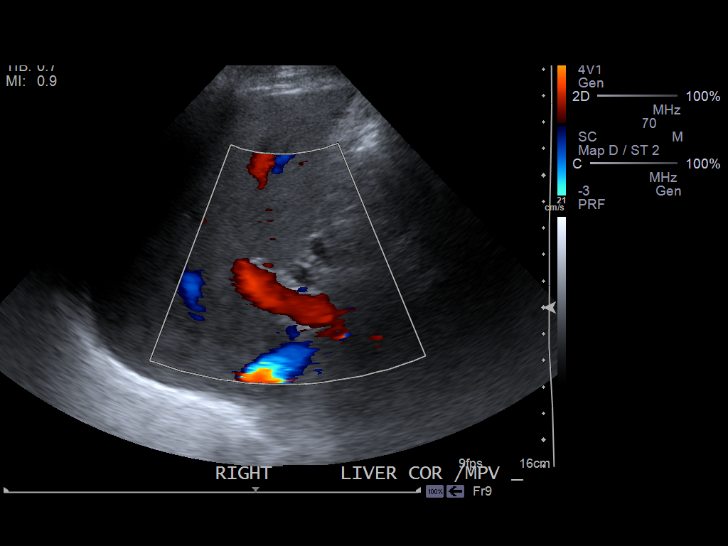
[im 34/89]
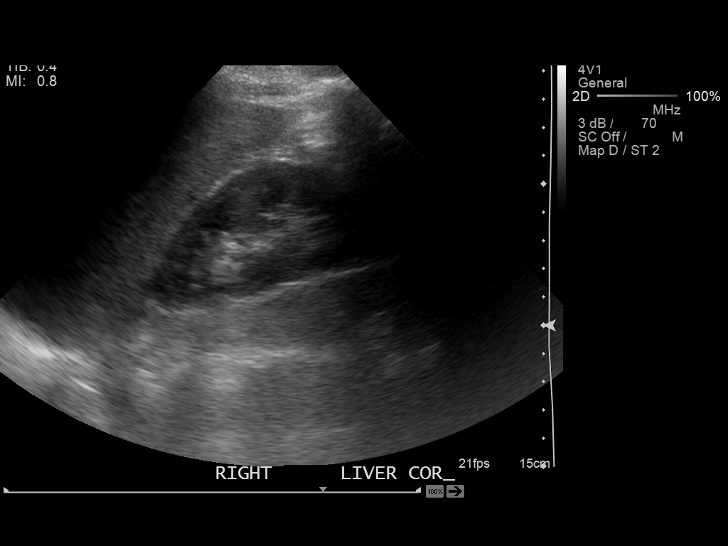
[im 41/89]
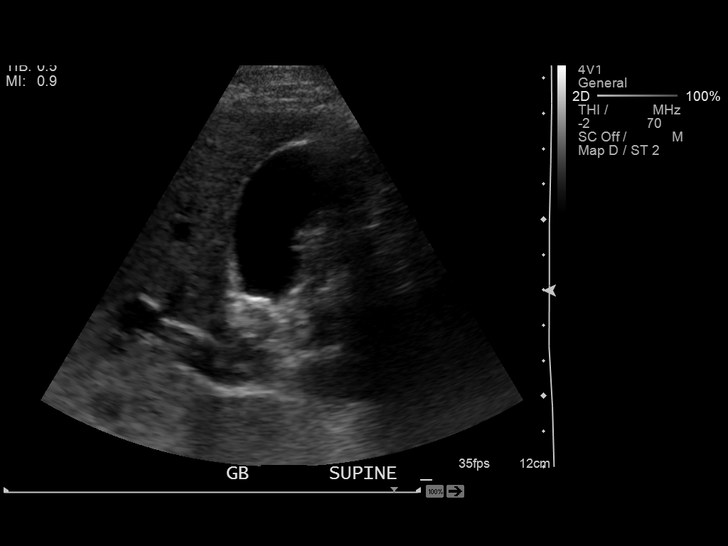
[im 48/89]
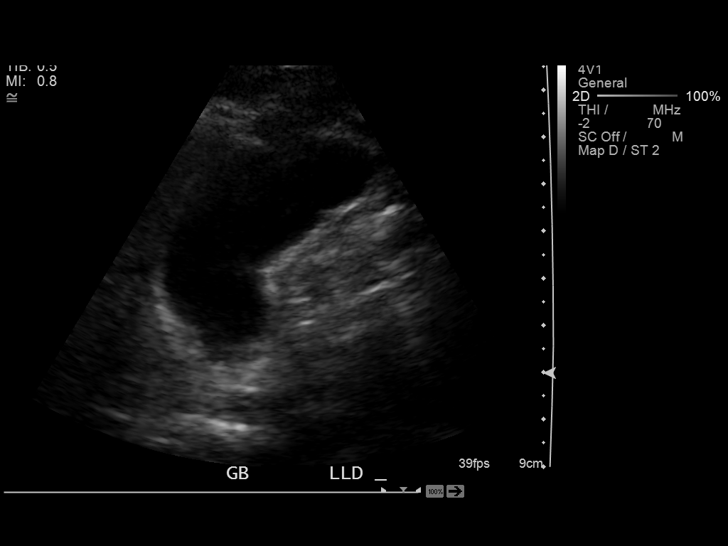
[im 56/89]
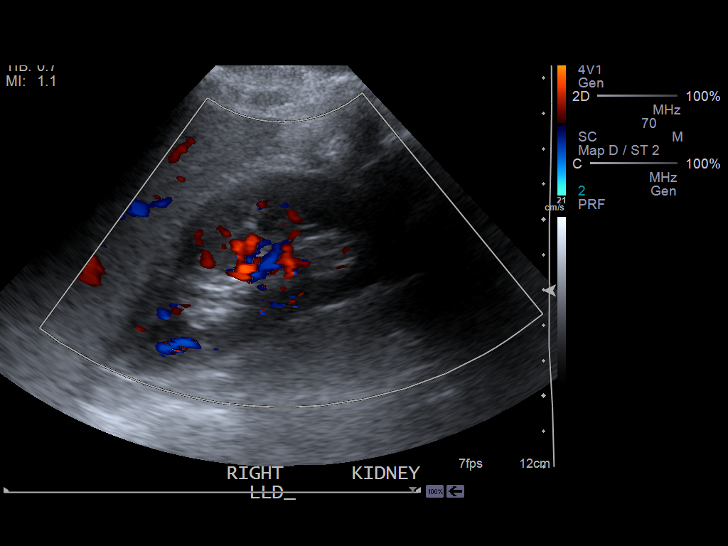
[im 59/89]
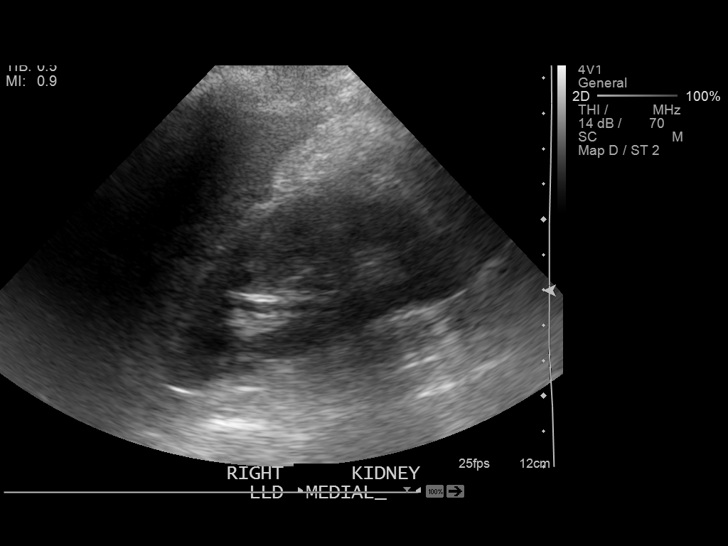
[im 67/89]
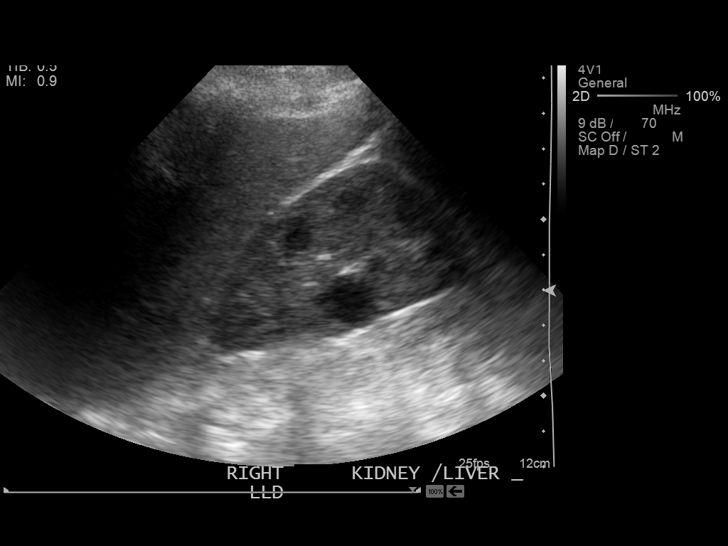
[im 74/89]
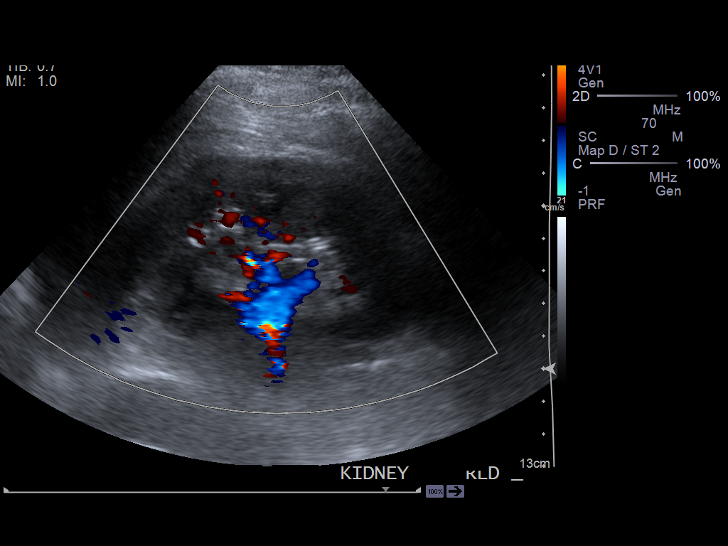
[im 81/89]
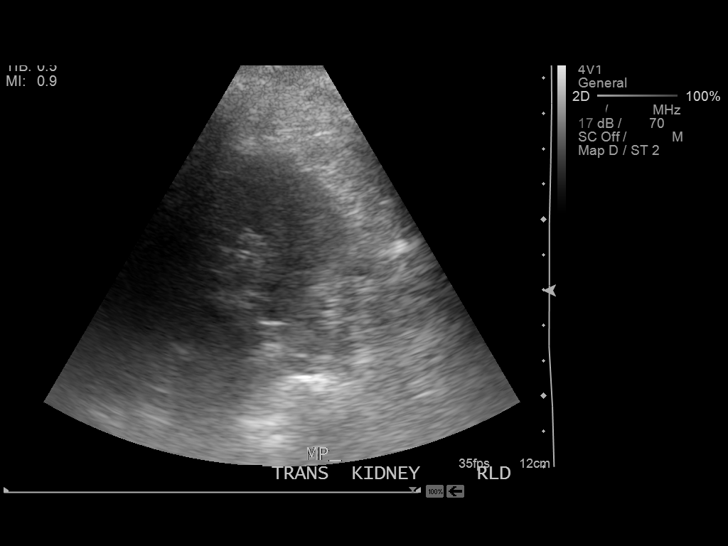
[im 89/89]
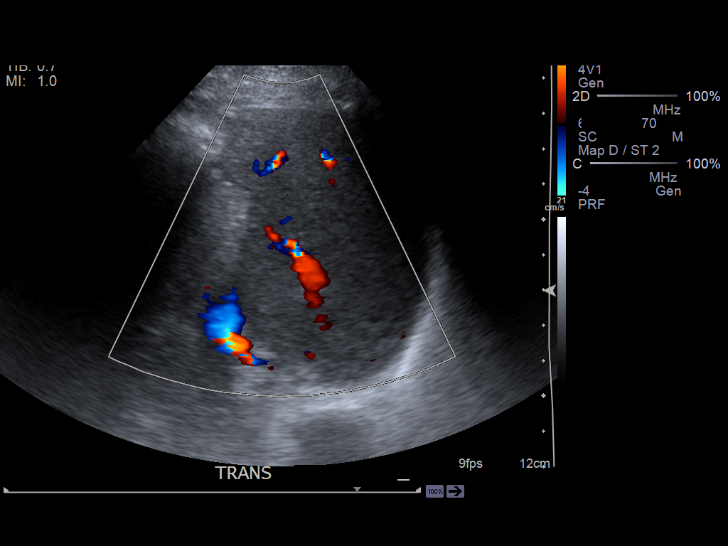

[14 of 25 positions shown; findings below may reference images not displayed]

FINDINGS: Gallbladder: No gallstones or wall thickening visualized. No
sonographic Murphy sign noted.

Common bile duct: Diameter: 6.6 mm without change compared to prior
exam.

Liver: Increased echogenicity suggestive of fatty infiltration
without focal mass identified

IVC: No abnormality visualized.

Pancreas: Visualized portion unremarkable.

Spleen: Size and appearance within normal limits.

Right Kidney: Length: 8.8 cm.. No hydronephrosis. 1.7 cm upper pole
cyst.

Left Kidney: Length: 8.5 cm. Echogenicity within normal limits. No
mass or hydronephrosis visualized.

Abdominal aorta: Focal bulge proximal abdominal aorta measuring
cm (proximal to this level aorta measures 2.3 cm).

Other findings: None.
IMPRESSION: Increased liver echogenicity suggestive of fatty infiltration
without focal liver lesion identified.

Focal bulge proximal abdominal aorta measuring 2.4 cm (proximal to
this level aorta measures 2.3 cm).

1.7 cm right renal upper pole cyst.

## 2016-12-16 DIAGNOSIS — Z942 Lung transplant status: Secondary | ICD-10-CM | POA: Diagnosis not present

## 2016-12-16 DIAGNOSIS — N182 Chronic kidney disease, stage 2 (mild): Secondary | ICD-10-CM | POA: Diagnosis not present

## 2016-12-16 DIAGNOSIS — Z79899 Other long term (current) drug therapy: Secondary | ICD-10-CM | POA: Diagnosis not present

## 2016-12-16 DIAGNOSIS — T8691 Unspecified transplanted organ and tissue rejection: Secondary | ICD-10-CM | POA: Diagnosis not present

## 2016-12-18 DIAGNOSIS — T8691 Unspecified transplanted organ and tissue rejection: Secondary | ICD-10-CM | POA: Diagnosis not present

## 2016-12-18 DIAGNOSIS — Z942 Lung transplant status: Secondary | ICD-10-CM | POA: Diagnosis not present

## 2016-12-18 DIAGNOSIS — T86818 Other complications of lung transplant: Secondary | ICD-10-CM | POA: Diagnosis not present

## 2016-12-18 DIAGNOSIS — N182 Chronic kidney disease, stage 2 (mild): Secondary | ICD-10-CM | POA: Diagnosis not present

## 2016-12-19 DIAGNOSIS — N182 Chronic kidney disease, stage 2 (mild): Secondary | ICD-10-CM | POA: Diagnosis not present

## 2016-12-19 DIAGNOSIS — T8691 Unspecified transplanted organ and tissue rejection: Secondary | ICD-10-CM | POA: Diagnosis not present

## 2016-12-19 DIAGNOSIS — Z942 Lung transplant status: Secondary | ICD-10-CM | POA: Diagnosis not present

## 2016-12-23 DIAGNOSIS — N182 Chronic kidney disease, stage 2 (mild): Secondary | ICD-10-CM | POA: Diagnosis not present

## 2016-12-23 DIAGNOSIS — T8691 Unspecified transplanted organ and tissue rejection: Secondary | ICD-10-CM | POA: Diagnosis not present

## 2016-12-23 DIAGNOSIS — Z942 Lung transplant status: Secondary | ICD-10-CM | POA: Diagnosis not present

## 2016-12-24 DIAGNOSIS — R918 Other nonspecific abnormal finding of lung field: Secondary | ICD-10-CM | POA: Diagnosis not present

## 2016-12-24 DIAGNOSIS — Z5181 Encounter for therapeutic drug level monitoring: Secondary | ICD-10-CM | POA: Diagnosis not present

## 2016-12-24 DIAGNOSIS — Z942 Lung transplant status: Secondary | ICD-10-CM | POA: Diagnosis not present

## 2016-12-24 DIAGNOSIS — D849 Immunodeficiency, unspecified: Secondary | ICD-10-CM | POA: Diagnosis not present

## 2016-12-25 DIAGNOSIS — N182 Chronic kidney disease, stage 2 (mild): Secondary | ICD-10-CM | POA: Diagnosis not present

## 2016-12-25 DIAGNOSIS — Z942 Lung transplant status: Secondary | ICD-10-CM | POA: Diagnosis not present

## 2016-12-25 DIAGNOSIS — T8691 Unspecified transplanted organ and tissue rejection: Secondary | ICD-10-CM | POA: Diagnosis not present

## 2016-12-29 ENCOUNTER — Telehealth: Payer: Self-pay

## 2016-12-29 NOTE — Telephone Encounter (Signed)
Amy from [facility] was calling to notify Dr. Ancil Boozer she would be discharging Ms. Bobbye Charleston today from [facility]. If she had any questions she could call her at [phone]. Thanks

## 2016-12-30 ENCOUNTER — Telehealth: Payer: Self-pay | Admitting: Family Medicine

## 2016-12-30 NOTE — Telephone Encounter (Signed)
AMY MOON WITH [facility] AT [phone] WANTS THE DR TO KOW PT IS HAVING STOMACH ISSUE SINCE THE WEEKEND AND THE HOME HEALTH  NURSE WAS TO D/C THE PATIENT BUT WANTS TO KNOW IF SHE COULD HAVE A ORDER TO SEE HER THRU NEXT WEEK HOPING THAT THE PATIENT WILL BE FEELING BETTER AND SHE ALSO HAS AN APPT AT [facility] TOMORROW FOR A PROCEDURE. HOME HEALTH ADVISED PT SINCE SHE IS STILL FEELING SICK ON STOMACH W/ DIAH NEEDS TO SEE IF THEY STILL WANT TO DO THE PROCEDURE TOMORROW.

## 2016-12-31 DIAGNOSIS — K219 Gastro-esophageal reflux disease without esophagitis: Secondary | ICD-10-CM | POA: Diagnosis not present

## 2016-12-31 DIAGNOSIS — Z942 Lung transplant status: Secondary | ICD-10-CM | POA: Diagnosis not present

## 2016-12-31 NOTE — Telephone Encounter (Signed)
Okay to resume home health

## 2017-01-06 ENCOUNTER — Ambulatory Visit (INDEPENDENT_AMBULATORY_CARE_PROVIDER_SITE_OTHER): Payer: 59 | Admitting: Family Medicine

## 2017-01-06 ENCOUNTER — Encounter: Payer: Self-pay | Admitting: Family Medicine

## 2017-01-06 VITALS — BP 118/72 | HR 82 | Temp 97.2°F | Resp 16 | Ht 61.0 in | Wt 112.1 lb

## 2017-01-06 DIAGNOSIS — M349 Systemic sclerosis, unspecified: Secondary | ICD-10-CM

## 2017-01-06 DIAGNOSIS — D473 Essential (hemorrhagic) thrombocythemia: Secondary | ICD-10-CM | POA: Diagnosis not present

## 2017-01-06 DIAGNOSIS — Z942 Lung transplant status: Secondary | ICD-10-CM

## 2017-01-06 DIAGNOSIS — D849 Immunodeficiency, unspecified: Secondary | ICD-10-CM

## 2017-01-06 DIAGNOSIS — N182 Chronic kidney disease, stage 2 (mild): Secondary | ICD-10-CM | POA: Diagnosis not present

## 2017-01-06 DIAGNOSIS — F320 Major depressive disorder, single episode, mild: Secondary | ICD-10-CM | POA: Diagnosis not present

## 2017-01-06 DIAGNOSIS — T8691 Unspecified transplanted organ and tissue rejection: Secondary | ICD-10-CM | POA: Diagnosis not present

## 2017-01-06 DIAGNOSIS — D75839 Thrombocytosis, unspecified: Secondary | ICD-10-CM

## 2017-01-06 DIAGNOSIS — E441 Mild protein-calorie malnutrition: Secondary | ICD-10-CM

## 2017-01-06 DIAGNOSIS — D899 Disorder involving the immune mechanism, unspecified: Secondary | ICD-10-CM

## 2017-01-06 NOTE — Patient Instructions (Signed)
Premier shake ( at [facility])

## 2017-01-06 NOTE — Progress Notes (Signed)
Name: Erika Martinez   MRN: [id number]    DOB: April 25, 1956   Date:01/06/2017       Progress Note  Subjective  Chief Complaint  Chief Complaint  Patient presents with  . [facility] needs [facility] for post op [facility]    needs post op rehab for [facility] and would like for you to listen to her lungs    HPI   S/p lung transplant/scleroderma: it was on March 20th, 2018 and she had a complication - inability to eat and drink, she was vomiting her medications and it was causing some lung rejection - she  was re-admitted 10/22/2016, she was given fluids, antibiotics. She is still feeling weak, more bored than anything. She is having home PT and will have to switch outpatient rehab. She uses a walker to assist with ambulance. She denies SOB, occasional dry cough. Compliant with all medication and follow up at [facility]. She had labs done about 2 weeks ago, showed low total protein and also thrombocytopenia, but anemia has resolved. She is off iron supplementation now.   Mild depression: she is on Zyprexa to help her sleep and appetite, started by psychiatrist during last admission at [facility], she was recently seen by her and was given the option to stop Zyprexa if she is feeling better. She does not know what to do with her life from now on, in terms of employment.   SVT: since last admission she stopped taking Metoprolol, but she has SVT and advised her to resume medication.    Patient Active Problem List   Diagnosis Date Noted  . Immunosuppressed status ([place]) 12/02/2016  . S/P lung transplant ([place]) 08/26/2016  . Raynaud disease 11/26/2015  . Decreased GFR 07/24/2015  . Mild major depression ([place]) 06/07/2015  . Hypertension, benign 04/06/2015  . History of hepatitis C 02/02/2015  . Insomnia 02/02/2015  . Gastro-esophageal reflux disease without esophagitis 02/02/2015  . IBS (irritable bowel syndrome) 02/02/2015  . Low serum cobalamin 02/02/2015  . Perennial allergic rhinitis with  seasonal variation 02/02/2015  . Scleroderma ([place]) 02/02/2015  . SPL (spondylolisthesis) 02/02/2015  . Supraventricular tachycardia ([place]) 02/02/2015  . Vitamin D deficiency 02/02/2015  . Anxiety 02/02/2015  . History of pneumococcal pneumonia 09/13/2013  . Polycythemia, secondary 09/13/2013    Past Surgical History:  Procedure Laterality Date  . ABDOMINAL HYSTERECTOMY    . VAGINAL HYSTERECTOMY      Family History  Problem Relation Age of Onset  . Lung cancer Father   . Cancer Father   . Alzheimer's disease Mother   . Breast cancer Neg Hx     Social History   Social History  . Marital status: Divorced    Spouse name: N/A  . Number of children: N/A  . Years of education: N/A   Occupational History  . Not on file.   Social History Main Topics  . Smoking status: Never Smoker  . Smokeless tobacco: Never Used  . Alcohol use 2.0 oz/week    4 Standard drinks or equivalent per week     Comment: "social drinker"  . Drug use: No  . Sexual activity: Not on file   Other Topics Concern  . Not on file   Social History Narrative  . No narrative on file     Current Outpatient Prescriptions:  .  acetaminophen (TYLENOL) 500 MG tablet, Take 1 tablet by mouth every 4 (four) hours as needed., Disp: , Rfl:  .  aspirin 81 MG chewable  tablet, Chew 1 tablet by mouth daily., Disp: , Rfl:  .  azaTHIOprine (IMURAN) 50 MG tablet, Take 1 tablet (50 mg total) by mouth daily., Disp: 60 tablet, Rfl: 0 .  Calcium Citrate-Vitamin D (CALCIUM CITRATE+D3 PETITES) 200-250 MG-UNIT TABS, Take 3 tablets by mouth 2 (two) times daily., Disp: , Rfl:  .  cholecalciferol (VITAMIN D) 1000 UNITS tablet, Take 2,000 Units by mouth daily., Disp: , Rfl:  .  fluticasone (FLONASE) 50 MCG/ACT nasal spray, Place 2 sprays into both nostrils daily., Disp: 16 g, Rfl: 6 .  Immune Globulin, Human, 2.5 GM/25ML SOLN, Inject into the vein., Disp: , Rfl:  .  loratadine (CLARITIN) 10 MG tablet, TAKE 1 TABLET (10 MG TOTAL)  BY MOUTH 2 (TWO) TIMES DAILY., Disp: 60 tablet, Rfl: 3 .  magnesium oxide (MAG-OX) 400 MG tablet, Take 1 tablet by mouth daily., Disp: , Rfl:  .  metoprolol tartrate (LOPRESSOR) 25 MG tablet, Take 0.5 tablets by mouth 2 (two) times daily., Disp: , Rfl: 11 .  Multiple Vitamin (MULTI-VITAMINS) TABS, Take 1 tablet by mouth daily., Disp: , Rfl:  .  OLANZapine (ZYPREXA) 5 MG tablet, Take 1 tablet by mouth daily., Disp: , Rfl: 0 .  Omeprazole 20 MG TBEC, Take 1 tablet by mouth daily., Disp: , Rfl:  .  ondansetron (ZOFRAN) 4 MG tablet, Take 1 tablet by mouth daily., Disp: , Rfl:  .  oxyCODONE (OXY IR/ROXICODONE) 5 MG immediate release tablet, Take 1 tablet by mouth as needed., Disp: , Rfl: 0 .  predniSONE (DELTASONE) 5 MG tablet, Take 4 tablets by mouth 4 (four) times daily., Disp: , Rfl:  .  Skin Protectants, Misc. (EUCERIN) cream, Apply topically., Disp: , Rfl:  .  sulfamethoxazole-trimethoprim (BACTRIM DS,SEPTRA DS) 800-160 MG tablet, TAKE 1 TABLET BY MOUTH 3 (THREE) TIMES A WEEK., Disp: , Rfl: 5 .  tacrolimus (PROGRAF) 1 MG capsule, Take 1 capsule by mouth every 12 (twelve) hours., Disp: , Rfl:  .  valGANciclovir (VALCYTE) 450 MG tablet, Take 1 tablet by mouth daily., Disp: , Rfl:  .  VENTOLIN HFA 108 (90 Base) MCG/ACT inhaler, INHALE 2 PUFFS INTO THE LUNGS EVERY 4 (FOUR) HOURS AS NEEDED FOR WHEEZING OR SHORTNESS OF BREATH., Disp: 18 g, Rfl: 5 .  vitamin B-12 (CYANOCOBALAMIN) 250 MCG tablet, Place 250 mcg under the tongue every other day. , Disp: , Rfl:  .  fluconazole (DIFLUCAN) 200 MG tablet, TAKE 2 TABLETS (400 MG TOTAL) BY MOUTH ONCE DAILY., Disp: , Rfl: 3  No Known Allergies   ROS  Constitutional: Negative for fever or weight change.  Respiratory: Positive  for mild intermittent cough, no  shortness of breath.   Cardiovascular: Negative for chest pain or palpitations.  Gastrointestinal: Negative for abdominal pain, no bowel changes.  Musculoskeletal: positive for gait problem but no   joint swelling.  Skin: Negative for rash.  Neurological: Negative for dizziness or headache.  No other specific complaints in a complete review of systems (except as listed in HPI above).  Objective  Vitals:   01/06/17 0941  BP: 118/72  Pulse: 82  Resp: 16  Temp: (!) 97.2 F (36.2 C)  SpO2: 100%  Weight: 112 lb 2 oz (50.9 kg)  Height: 5\' 1"  (1.549 m)    Body mass index is 21.19 kg/m.  Physical Exam  Constitutional: Patient appears well-developed and thin, uses walker, no longer has nasal cannula oxygen.  No distress.  HEENT: head atraumatic, normocephalic, pupils equal and reactive to light,  neck  supple, throat within normal limits Cardiovascular: Normal rate, regular rhythm and normal heart sounds.  No murmur heard. No BLE edema. Pulmonary/Chest: Effort normal , end inspiratory fine crackles very faint.  No respiratory distress. Abdominal: Soft.  There is no tenderness. Psychiatric: Patient has a normal mood and affect. behavior is normal. Judgment and thought content normal. Neurological : tremors of hands, mild shaking of leg  PHQ2/9: Depression screen [facility] 2/9 12/02/2016 06/27/2016 03/26/2016 12/14/2015 10/03/2015  Decreased Interest 0 0 0 1 0  Down, Depressed, Hopeless 0 0 0 1 0  PHQ - 2 Score 0 0 0 2 0  Altered sleeping - - - 1 -  Tired, decreased energy - - - 2 -  Change in appetite - - - 2 -  Feeling bad or failure about yourself  - - - 1 -  Trouble concentrating - - - 0 -  Moving slowly or fidgety/restless - - - 0 -  Suicidal thoughts - - - 0 -  PHQ-9 Score - - - 8 -  Difficult doing work/chores - - - Somewhat difficult -  Some recent data might be hidden    Fall Risk: Fall Risk  12/02/2016 06/27/2016 03/26/2016 12/14/2015 10/03/2015  Falls in the past year? No No No No No     Assessment & Plan  1. S/P lung transplant ([place])  - AMB referral to pulmonary rehabilitation  2. Immunosuppressed status ([place])  No fever at this time  3. Scleroderma  ([place])  stable  4. Thrombocytosis ([place])  Keep follow up with [facility]  5. Mild protein malnutrition ([facility])  Discussed adding protein shakes and a snack, not as meal replacement   6. Mild major depression ([place])  Doing well

## 2017-01-07 ENCOUNTER — Other Ambulatory Visit: Payer: Self-pay | Admitting: Family Medicine

## 2017-01-07 DIAGNOSIS — J3089 Other allergic rhinitis: Principal | ICD-10-CM

## 2017-01-07 DIAGNOSIS — J302 Other seasonal allergic rhinitis: Secondary | ICD-10-CM

## 2017-01-07 NOTE — Telephone Encounter (Signed)
Patient requesting refill of Flonase to [facility]. 

## 2017-01-09 DIAGNOSIS — Z79899 Other long term (current) drug therapy: Secondary | ICD-10-CM | POA: Diagnosis not present

## 2017-01-09 DIAGNOSIS — Z942 Lung transplant status: Secondary | ICD-10-CM | POA: Diagnosis not present

## 2017-01-12 ENCOUNTER — Encounter: Payer: 59 | Attending: Family Medicine

## 2017-01-12 VITALS — Ht 61.0 in | Wt 109.1 lb

## 2017-01-12 DIAGNOSIS — Z942 Lung transplant status: Secondary | ICD-10-CM | POA: Insufficient documentation

## 2017-01-12 NOTE — Progress Notes (Signed)
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 Management: Provide education and appropriate resources to help participant work on and attain dietary goals.;Weight Management/Obesity: Establish reasonable short term and long term weight goals.   Admit Weight 111 lb (50.3 kg)   Goal Weight: Short Term 116 lb (52.6 kg)   Goal Weight: Long Term 120 lb (54.4 kg)   Expected Outcomes Short Term: Continue to assess and modify interventions until short term weight is achieved;Long Term: Adherence to nutrition and physical activity/exercise program aimed toward attainment of established weight goal;Weight Maintenance: Understanding of the daily nutrition guidelines,  which includes 25-35% calories from fat, 7% or less cal from saturated fats, less than 200mg  cholesterol, less than 1.5gm of sodium, & 5 or more servings of fruits and vegetables daily;Understanding recommendations for meals to include 15-35% energy as protein, 25-35% energy from fat, 35-60% energy from carbohydrates, less than 200mg  of dietary cholesterol, 20-35 gm of total fiber daily;Understanding of distribution of calorie intake throughout the day with the consumption of 4-5 meals/snacks;Weight Gain: Understanding of general recommendations for a high calorie, high protein meal plan that promotes weight gain by distributing calorie intake throughout the day with the consumption for 4-5 meals, snacks, and/or supplements   Improve shortness of breath with ADL's Yes   Intervention Provide education, individualized exercise plan and daily activity  instruction to help decrease symptoms of SOB with activities of daily living.   Expected Outcomes Short Term: Achieves a reduction of symptoms when performing activities of daily living.   Develop more efficient breathing techniques such as purse lipped breathing and diaphragmatic breathing; and practicing self-pacing with activity Yes   Intervention Provide education, demonstration and support about specific breathing techniuqes utilized for more efficient breathing. Include techniques such as pursed lipped breathing, diaphragmatic breathing and self-pacing activity.   Expected Outcomes Short Term: Participant will be able to demonstrate and use breathing techniques as needed throughout daily activities.   Increase knowledge of respiratory medications and ability to use respiratory devices properly  Yes   Intervention Provide education and demonstration as needed of appropriate use of medications, inhalers, and oxygen therapy.   Expected Outcomes Short Term: Achieves understanding of medications use. Understands that oxygen is a medication prescribed by  physician. Demonstrates appropriate use of inhaler and oxygen therapy.   Personal Goal Wants to be able to build her pulmonary support group   Intervention Meet more than once a month with her pulmonary support group   Expected Outcomes Short: Meet with pulmonary support group. Long: Make the pulmonary support group expand.      Core Components/Risk Factors/Patient Goals Review:    Core Components/Risk Factors/Patient Goals at Discharge (Final Review):    ITP Comments:     ITP Comments    Row Name 01/12/17 1649           ITP Comments Medical Evaluation completed. Chart sent to Dr. Emily Filbert director of [place] for review and changes. Diagnosis can be found in [facility] diagnosis.          Comments: Initial ITP

## 2017-01-12 NOTE — Patient Instructions (Signed)
Patient Instructions  Patient Details  Name: Erika Martinez MRN: [id number] Date of Birth: 31-Mar-1956 Referring Provider:  Steele Sizer, MD  Below are the personal goals you chose as well as exercise and nutrition goals. Our goal is to help you keep on track towards obtaining and maintaining your goals. We will be discussing your progress on these goals with you throughout the program.  Initial Exercise Prescription:     Initial Exercise Prescription - 01/12/17 1600      Date of Initial Exercise RX and Referring Provider   Date 01/12/17   Referring Provider Steele Sizer MD     NuStep   Level 1   SPM 80   Minutes 15   METs 2     REL-XR   Level 1   Speed 50   Minutes 15   METs 2     Track   Laps 21   Minutes 15   METs 2     Prescription Details   Frequency (times per week) 3   Duration Progress to 45 minutes of aerobic exercise without signs/symptoms of physical distress     Intensity   THRR 40-80% of Max Heartrate 126-147   Ratings of Perceived Exertion 11-13   Perceived Dyspnea 0-4     Progression   Progression Continue to progress workloads to maintain intensity without signs/symptoms of physical distress.     Resistance Training   Training Prescription Yes   Weight 3 lbs   Reps 10-15      Exercise Goals: Frequency: Be able to perform aerobic exercise three times per week working toward 3-5 days per week.  Intensity: Work with a perceived exertion of 11 (fairly light) - 15 (hard) as tolerated. Follow your new exercise prescription and watch for changes in prescription as you progress with the program. Changes will be reviewed with you when they are made.  Duration: You should be able to do 30 minutes of continuous aerobic exercise in addition to a 5 minute warm-up and a 5 minute cool-down routine.  Nutrition Goals: Your personal nutrition goals will be established when you do your nutrition analysis with the dietician.  The following are  nutrition guidelines to follow: Cholesterol < 200mg /day Sodium < 1500mg /day Fiber: Women over 50 yrs - 21 grams per day  Personal Goals:     Personal Goals and Risk Factors at Admission - 01/12/17 1527      Core Components/Risk Factors/Patient Goals on Admission    Weight Management Yes;Weight Gain   Intervention Weight Management: Develop a combined nutrition and exercise program designed to reach desired caloric intake, while maintaining appropriate intake of nutrient and fiber, sodium and fats, and appropriate energy expenditure required for the weight goal.;Weight Management: Provide education and appropriate resources to help participant work on and attain dietary goals.;Weight Management/Obesity: Establish reasonable short term and long term weight goals.   Admit Weight 111 lb (50.3 kg)   Goal Weight: Short Term 116 lb (52.6 kg)   Goal Weight: Long Term 120 lb (54.4 kg)   Expected Outcomes Short Term: Continue to assess and modify interventions until short term weight is achieved;Long Term: Adherence to nutrition and physical activity/exercise program aimed toward attainment of established weight goal;Weight Maintenance: Understanding of the daily nutrition guidelines, which includes 25-35% calories from fat, 7% or less cal from saturated fats, less than 200mg  cholesterol, less than 1.5gm of sodium, & 5 or more servings of fruits and vegetables daily;Understanding recommendations for meals to include 15-35% energy as  protein, 25-35% energy from fat, 35-60% energy from carbohydrates, less than 200mg  of dietary cholesterol, 20-35 gm of total fiber daily;Understanding of distribution of calorie intake throughout the day with the consumption of 4-5 meals/snacks;Weight Gain: Understanding of general recommendations for a high calorie, high protein meal plan that promotes weight gain by distributing calorie intake throughout the day with the consumption for 4-5 meals, snacks, and/or supplements    Improve shortness of breath with ADL's Yes   Intervention Provide education, individualized exercise plan and daily activity instruction to help decrease symptoms of SOB with activities of daily living.   Expected Outcomes Short Term: Achieves a reduction of symptoms when performing activities of daily living.   Develop more efficient breathing techniques such as purse lipped breathing and diaphragmatic breathing; and practicing self-pacing with activity Yes   Intervention Provide education, demonstration and support about specific breathing techniuqes utilized for more efficient breathing. Include techniques such as pursed lipped breathing, diaphragmatic breathing and self-pacing activity.   Expected Outcomes Short Term: Participant will be able to demonstrate and use breathing techniques as needed throughout daily activities.   Increase knowledge of respiratory medications and ability to use respiratory devices properly  Yes   Intervention Provide education and demonstration as needed of appropriate use of medications, inhalers, and oxygen therapy.   Expected Outcomes Short Term: Achieves understanding of medications use. Understands that oxygen is a medication prescribed by physician. Demonstrates appropriate use of inhaler and oxygen therapy.   Personal Goal Wants to be able to build her pulmonary support group   Intervention Meet more than once a month with her pulmonary support group   Expected Outcomes Short: Meet with pulmonary support group. Long: Make the pulmonary support group expand.      Tobacco Use Initial Evaluation: History  Smoking Status  . Never Smoker  Smokeless Tobacco  . Never Used    Copy of goals given to participant.

## 2017-01-12 NOTE — Progress Notes (Signed)
Daily Session Note  Patient Details  Name: Erika Martinez MRN: [id number] Date of Birth: [birth year] Referring Provider:     Pulmonary Rehab from 01/12/2017 in [facility] Cardiac and Pulmonary Rehab  Referring Provider  Steele Sizer MD      Encounter Date: 01/12/2017  Check In:     Session Check In - 01/12/17 1647      Check-In   Location [facility]-Cardiac & Pulmonary Rehab   Staff Present Alberteen Sam, MA, ACSM RCEP, Exercise Physiologist;Jamas Jaquay Flavia Shipper   Supervising physician immediately available to respond to emergencies [name] immediately available ER MD   Physician(s) Dr. Kerman Passey and Jimmye Norman   Medication changes reported     No   Fall or balance concerns reported    No   Warm-up and Cool-down Not performed (comment)  Medical Evaluation   Resistance Training Performed No  Medical Evaluation   VAD Patient? No     Pain Assessment   Currently in Pain? No/denies   Multiple Pain Sites No           Exercise Prescription Changes - 01/12/17 1600      Response to Exercise   Blood Pressure (Admit) 128/70   Blood Pressure (Exercise) 124/66   Blood Pressure (Exit) 126/60   Heart Rate (Admit) 105 bpm   Heart Rate (Exercise) 121 bpm   Heart Rate (Exit) 110 bpm   Oxygen Saturation (Admit) 97 %   Oxygen Saturation (Exercise) 100 %   Oxygen Saturation (Exit) 100 %   Rating of Perceived Exertion (Exercise) 13   Perceived Dyspnea (Exercise) 0   Symptoms legs felt like jello   Comments walk test results      History  Smoking Status  . Never Smoker  Smokeless Tobacco  . Never Used    Goals Met:  Exercise tolerated well Personal goals reviewed Queuing for purse lip breathing No report of cardiac concerns or symptoms  Goals Unmet:  Not Applicable  Comments: Medical evaluation Initial ITP created and sent to Dr. Emily Filbert Director of [place] for review and changes.    Dr. Emily Filbert is Medical Director for [place] and  [name].

## 2017-01-15 ENCOUNTER — Other Ambulatory Visit: Payer: Self-pay | Admitting: Family Medicine

## 2017-01-15 NOTE — Telephone Encounter (Signed)
Patient requesting refill of Omeprazole to [facility]. 

## 2017-01-16 DIAGNOSIS — Z942 Lung transplant status: Secondary | ICD-10-CM | POA: Diagnosis not present

## 2017-01-16 NOTE — Progress Notes (Signed)
Daily Session Note  Patient Details  Name: Erika Martinez MRN: [id number] Date of Birth: 01-17-1956 Referring Provider:     Pulmonary Rehab from 01/12/2017 in [facility] Cardiac and Pulmonary Rehab  Referring Provider  Steele Sizer MD      Encounter Date: 01/16/2017  Check In:     Session Check In - 01/16/17 1147      Check-In   Location [facility]-Cardiac & Pulmonary Rehab   Staff Present Renita Papa, RN BSN;Joseph Darrin Nipper, [place], ACSM RCEP, Exercise Physiologist   Supervising physician immediately available to respond to emergencies [name] immediately available ER MD   Physician(s) Dr. Jacqualine Code and Corky Downs   Medication changes reported     No   Fall or balance concerns reported    No   Warm-up and Cool-down Performed as group-led instruction   Resistance Training Performed Yes   VAD Patient? No     Pain Assessment   Currently in Pain? No/denies         History  Smoking Status  . Never Smoker  Smokeless Tobacco  . Never Used    Goals Met:  Proper associated with RPD/PD & O2 Sat Independence with exercise equipment Using PLB without cueing & demonstrates good technique Exercise tolerated well Strength training completed today  Goals Unmet:  Not Applicable  Comments: First full day of exercise!  Patient was oriented to gym and equipment including functions, settings, policies, and procedures.  Patient's individual exercise prescription and treatment plan were reviewed.  All starting workloads were established based on the results of the 6 minute walk test done at initial orientation visit.  The plan for exercise progression was also introduced and progression will be customized based on patient's performance and goals.    Dr. Emily Filbert is Medical Director for [place] and [name].

## 2017-01-21 ENCOUNTER — Encounter: Payer: 59 | Admitting: *Deleted

## 2017-01-21 DIAGNOSIS — Z942 Lung transplant status: Secondary | ICD-10-CM

## 2017-01-21 NOTE — Progress Notes (Signed)
Daily Session Note  Patient Details  Name: Erika Martinez MRN: [id number] Date of Birth: 09/06/1955 Referring Provider:     Pulmonary Rehab from 01/12/2017 in [facility] Cardiac and Pulmonary Rehab  Referring Provider  Steele Sizer MD      Encounter Date: 01/21/2017  Check In:     Session Check In - 01/21/17 1135      Check-In   Location [facility]-Cardiac & Pulmonary Rehab   Staff Present Renita Papa, RN BSN;Joseph Darrin Nipper, [place], ACSM RCEP, Exercise Physiologist   Supervising physician immediately available to respond to emergencies [name] immediately available ER MD   Physician(s) Dr. Jimmye Norman and Quentin Cornwall   Medication changes reported     No   Fall or balance concerns reported    No   Warm-up and Cool-down Performed as group-led instruction   Resistance Training Performed Yes   VAD Patient? No     Pain Assessment   Currently in Pain? No/denies           Exercise Prescription Changes - 01/20/17 1600      Response to Exercise   Blood Pressure (Admit) 122/78   Blood Pressure (Exit) 110/74   Heart Rate (Admit) 96 bpm   Heart Rate (Exercise) 101 bpm   Heart Rate (Exit) 83 bpm   Oxygen Saturation (Admit) 96 %   Oxygen Saturation (Exercise) 95 %   Oxygen Saturation (Exit) 99 %   Rating of Perceived Exertion (Exercise) 13   Perceived Dyspnea (Exercise) 0   Symptoms fatigue   Comments first full day of exercise   Duration Progress to 45 minutes of aerobic exercise without signs/symptoms of physical distress   Intensity THRR unchanged     Progression   Progression Continue to progress workloads to maintain intensity without signs/symptoms of physical distress.   Average METs 2.7     Resistance Training   Training Prescription Yes   Weight 3 lbs   Reps 10-15     Interval Training   Interval Training No     NuStep   Level 1   SPM 41   Minutes 15   METs 1.6     REL-XR   Level 1   Speed 24   Minutes 15   METs 3.8      History   Smoking Status  . Never Smoker  Smokeless Tobacco  . Never Used    Goals Met:  Proper associated with RPD/PD & O2 Sat Independence with exercise equipment Using PLB without cueing & demonstrates good technique Exercise tolerated well Strength training completed today  Goals Unmet:  Not Applicable  Comments: Pt able to follow exercise prescription today without complaint.  Will continue to monitor for progression.    Dr. Emily Filbert is Medical Director for [place] and [name].

## 2017-01-23 ENCOUNTER — Telehealth: Payer: Self-pay | Admitting: Family Medicine

## 2017-01-23 ENCOUNTER — Encounter: Payer: Self-pay | Admitting: Family Medicine

## 2017-01-23 ENCOUNTER — Ambulatory Visit (INDEPENDENT_AMBULATORY_CARE_PROVIDER_SITE_OTHER): Payer: 59 | Admitting: Family Medicine

## 2017-01-23 VITALS — BP 118/68 | HR 92 | Temp 98.7°F | Resp 16 | Ht 61.0 in | Wt 107.1 lb

## 2017-01-23 DIAGNOSIS — R198 Other specified symptoms and signs involving the digestive system and abdomen: Secondary | ICD-10-CM

## 2017-01-23 DIAGNOSIS — R195 Other fecal abnormalities: Secondary | ICD-10-CM | POA: Diagnosis not present

## 2017-01-23 DIAGNOSIS — R110 Nausea: Secondary | ICD-10-CM | POA: Diagnosis not present

## 2017-01-23 DIAGNOSIS — R1013 Epigastric pain: Secondary | ICD-10-CM

## 2017-01-23 MED ORDER — ONDANSETRON HCL 4 MG PO TABS: 4.0000 mg | ORAL_TABLET | Freq: Three times a day (TID) | ORAL | 0 refills | Status: DC | PRN

## 2017-01-23 MED ORDER — GI COCKTAIL ~~LOC~~: 30.0000 mL | Freq: Two times a day (BID) | ORAL | 0 refills | Status: DC | PRN

## 2017-01-23 MED ORDER — OMEPRAZOLE 20 MG PO CPDR: 20.0000 mg | DELAYED_RELEASE_CAPSULE | Freq: Two times a day (BID) | ORAL | 2 refills | Status: DC

## 2017-01-23 NOTE — Progress Notes (Signed)
Name: Erika Martinez   MRN: [id number]    DOB: 1955-08-27   Date:01/23/2017       Progress Note  Subjective  Chief Complaint  Chief Complaint  Patient presents with  . GI Problem    pt still having issues after weeks from a stomach virus, pt has lost a few lbs due to lack of apetite    HPI  Epigastric pain: she had a lung transplant March 20 th, 2018, taking prednisone and other immunosuppressant medications, had symptoms of gastroenteritis weeks ago and since than she has noticed decrease in appetite, epigastric pain, nausea and vomiting when she takes medications without eating. No diarrhea, or blood in stools. Denies fever. She has noticed decrease in appetite but also a fear of eating because pain on epigastric area increases when she eats.    Patient Active Problem List   Diagnosis Date Noted  . Immunosuppressed status ([place]) 12/02/2016  . S/P lung transplant ([place]) 08/26/2016  . Raynaud disease 11/26/2015  . Decreased GFR 07/24/2015  . Mild major depression ([place]) 06/07/2015  . Hypertension, benign 04/06/2015  . History of hepatitis C 02/02/2015  . Insomnia 02/02/2015  . Gastro-esophageal reflux disease without esophagitis 02/02/2015  . IBS (irritable bowel syndrome) 02/02/2015  . Low serum cobalamin 02/02/2015  . Perennial allergic rhinitis with seasonal variation 02/02/2015  . Scleroderma ([place]) 02/02/2015  . SPL (spondylolisthesis) 02/02/2015  . Supraventricular tachycardia ([place]) 02/02/2015  . Vitamin D deficiency 02/02/2015  . Anxiety 02/02/2015  . History of pneumococcal pneumonia 09/13/2013  . Polycythemia, secondary 09/13/2013    Past Surgical History:  Procedure Laterality Date  . ABDOMINAL HYSTERECTOMY    . VAGINAL HYSTERECTOMY      Family History  Problem Relation Age of Onset  . Lung cancer Father   . Cancer Father   . Alzheimer's disease Mother   . Breast cancer Neg Hx     Social History   Social History  . Marital status: Divorced    Spouse  name: N/A  . Number of children: N/A  . Years of education: N/A   Occupational History  . Not on file.   Social History Main Topics  . Smoking status: Never Smoker  . Smokeless tobacco: Never Used  . Alcohol use 2.0 oz/week    4 Standard drinks or equivalent per week     Comment: "social drinker"  . Drug use: No  . Sexual activity: Not on file   Other Topics Concern  . Not on file   Social History Narrative  . No narrative on file     Current Outpatient Prescriptions:  .  acetaminophen (TYLENOL) 500 MG tablet, Take 1 tablet by mouth every 4 (four) hours as needed., Disp: , Rfl:  .  aspirin 81 MG chewable tablet, Chew 1 tablet by mouth daily., Disp: , Rfl:  .  azaTHIOprine (IMURAN) 50 MG tablet, Take 1 tablet (50 mg total) by mouth daily., Disp: 60 tablet, Rfl: 0 .  Calcium Citrate-Vitamin D (CALCIUM CITRATE+D3 PETITES) 200-250 MG-UNIT TABS, Take 3 tablets by mouth 2 (two) times daily., Disp: , Rfl:  .  cholecalciferol (VITAMIN D) 1000 UNITS tablet, Take 2,000 Units by mouth daily., Disp: , Rfl:  .  fluconazole (DIFLUCAN) 200 MG tablet, TAKE 2 TABLETS (400 MG TOTAL) BY MOUTH ONCE DAILY., Disp: , Rfl: 3 .  fluticasone (FLONASE) 50 MCG/ACT nasal spray, PLACE 2 SPRAYS INTO BOTH NOSTRILS DAILY., Disp: 16 g, Rfl: 6 .  Immune Globulin, Human, 2.5 GM/25ML SOLN, Inject  into the vein., Disp: , Rfl:  .  loratadine (CLARITIN) 10 MG tablet, TAKE 1 TABLET (10 MG TOTAL) BY MOUTH 2 (TWO) TIMES DAILY., Disp: 60 tablet, Rfl: 3 .  magnesium oxide (MAG-OX) 400 MG tablet, Take 1 tablet by mouth daily., Disp: , Rfl:  .  metoprolol tartrate (LOPRESSOR) 25 MG tablet, Take 0.5 tablets by mouth 2 (two) times daily., Disp: , Rfl: 11 .  Multiple Vitamin (MULTI-VITAMINS) TABS, Take 1 tablet by mouth daily., Disp: , Rfl:  .  OLANZapine (ZYPREXA) 5 MG tablet, Take 1 tablet by mouth daily., Disp: , Rfl: 0 .  omeprazole (PRILOSEC) 20 MG capsule, TAKE ONE CAPSULE BY MOUTH IN THE MORNING FOR GERD, Disp: 30  capsule, Rfl: 0 .  ondansetron (ZOFRAN) 4 MG tablet, Take 1 tablet by mouth daily., Disp: , Rfl:  .  oxyCODONE (OXY IR/ROXICODONE) 5 MG immediate release tablet, Take 1 tablet by mouth as needed., Disp: , Rfl: 0 .  predniSONE (DELTASONE) 5 MG tablet, Take 4 tablets by mouth 4 (four) times daily., Disp: , Rfl:  .  Skin Protectants, Misc. (EUCERIN) cream, Apply topically., Disp: , Rfl:  .  sulfamethoxazole-trimethoprim (BACTRIM DS,SEPTRA DS) 800-160 MG tablet, TAKE 1 TABLET BY MOUTH 3 (THREE) TIMES A WEEK., Disp: , Rfl: 5 .  tacrolimus (PROGRAF) 1 MG capsule, Take 1 capsule by mouth every 12 (twelve) hours., Disp: , Rfl:  .  valGANciclovir (VALCYTE) 450 MG tablet, Take 1 tablet by mouth daily., Disp: , Rfl:  .  VENTOLIN HFA 108 (90 Base) MCG/ACT inhaler, INHALE 2 PUFFS INTO THE LUNGS EVERY 4 (FOUR) HOURS AS NEEDED FOR WHEEZING OR SHORTNESS OF BREATH., Disp: 18 g, Rfl: 5 .  vitamin B-12 (CYANOCOBALAMIN) 250 MCG tablet, Place 250 mcg under the tongue every other day. , Disp: , Rfl:   No Known Allergies   ROS  Ten systems reviewed and is negative except as mentioned in HPI   Objective  Vitals:   01/23/17 1002  BP: 118/68  Pulse: 92  Resp: 16  Temp: 98.7 F (37.1 C)  SpO2: 96%  Weight: 107 lb 2 oz (48.6 kg)  Height: 5\' 1"  (1.549 m)    Body mass index is 20.24 kg/m.  Physical Exam  Constitutional: Patient appears well-developed and well-nourished.  No distress.  HEENT: head atraumatic, normocephalic, pupils equal and reactive to light, neck supple, throat within normal limits Cardiovascular: Normal rate, regular rhythm and normal heart sounds.  No murmur heard. No BLE edema. Pulmonary/Chest: Effort normal and breath sounds normal. No respiratory distress. Abdominal: Soft.  There is epigastric tenderness. Psychiatric: Patient has a normal mood and affect. behavior is normal. Judgment and thought content normal.   PHQ2/9: Depression screen [facility] 2/9 01/12/2017 12/02/2016 06/27/2016  03/26/2016 12/14/2015  Decreased Interest 0 0 0 0 1  Down, Depressed, Hopeless 1 0 0 0 1  PHQ - 2 Score 1 0 0 0 2  Altered sleeping 0 - - - 1  Tired, decreased energy 2 - - - 2  Change in appetite 1 - - - 2  Feeling bad or failure about yourself  0 - - - 1  Trouble concentrating 0 - - - 0  Moving slowly or fidgety/restless 0 - - - 0  Suicidal thoughts 0 - - - 0  PHQ-9 Score 4 - - - 8  Difficult doing work/chores Not difficult at all - - - Somewhat difficult  Some recent data might be hidden     Fall Risk: Fall Risk  01/12/2017 12/02/2016 06/27/2016 03/26/2016 12/14/2015  Falls in the past year? No No No No No      Assessment & Plan  1. Epigastric pain  - omeprazole (PRILOSEC) 20 MG capsule; Take 1 capsule (20 mg total) by mouth 2 (two) times daily before a meal.  Dispense: 60 capsule; Refill: 2 - ondansetron (ZOFRAN) 4 MG tablet; Take 1 tablet (4 mg total) by mouth every 8 (eight) hours as needed for nausea or vomiting.  Dispense: 30 tablet; Refill: 0 - H. pylori breath test - Lipase - Amylase - Alum & Mag Hydroxide-Simeth (GI COCKTAIL) SUSP suspension; Take 30 mLs by mouth 2 (two) times daily as needed for indigestion. Shake well.  Dispense: 120 mL; Refill: 0  2. Nausea  - omeprazole (PRILOSEC) 20 MG capsule; Take 1 capsule (20 mg total) by mouth 2 (two) times daily before a meal.  Dispense: 60 capsule; Refill: 2 - ondansetron (ZOFRAN) 4 MG tablet; Take 1 tablet (4 mg total) by mouth every 8 (eight) hours as needed for nausea or vomiting.  Dispense: 30 tablet; Refill: 0 - H. pylori breath test - Lipase - Amylase - Alum & Mag Hydroxide-Simeth (GI COCKTAIL) SUSP suspension; Take 30 mLs by mouth 2 (two) times daily as needed for indigestion. Shake well.  Dispense: 120 mL; Refill: 0  4. Loose stools  - Stool profile, quantitative -Red flags and when to present for emergency care or RTC including fever >101.68F,vomiting blood, or blood in stools, inability to eat or worsening of  symptoms over the weekend.  Follow up and care instructions discussed and provided in AVS.  -Red flags and when to present for emergency care or RTC including fever >101.68F, blood in stools or vomit, worsening of symptoms,  reviewed with patient at time of visit. Follow up and care instructions discussed and provided in AVS.

## 2017-01-23 NOTE — Telephone Encounter (Signed)
Randy from [facility] calling pertaining to the Gi cocktail which comes in different formulas. the one they use the most is  Lidocaine antiacid donnatal in equal parts. Would like to know if your okay with using this formula. Please return call [phone]

## 2017-01-23 NOTE — Patient Instructions (Signed)
Bland Diet A bland diet consists of foods that do not have a lot of fat or fiber. Foods without fat or fiber are easier for the body to digest. They are also less likely to irritate your mouth, throat, stomach, and other parts of your gastrointestinal tract. A bland diet is sometimes called a BRAT diet. What is my plan? Your health care provider or dietitian may recommend specific changes to your diet to prevent and treat your symptoms, such as: Eating small meals often. Cooking food until it is soft enough to chew easily. Chewing your food well. Drinking fluids slowly. Not eating foods that are very spicy, sour, or fatty. Not eating citrus fruits, such as oranges and grapefruit.  What do I need to know about this diet? Eat a variety of foods from the bland diet food list. Do not follow a bland diet longer than you have to. Ask your health care provider whether you should take vitamins. What foods can I eat? Grains  Hot cereals, such as cream of wheat. Bread, crackers, or tortillas made from refined white flour. Rice. Vegetables Canned or cooked vegetables. Mashed or boiled potatoes. Fruits Bananas. Applesauce. Other types of cooked or canned fruit with the skin and seeds removed, such as canned peaches or pears. Meats and Other Protein Sources Scrambled eggs. Creamy peanut butter or other nut butters. Lean, well-cooked meats, such as chicken or fish. Tofu. Soups or broths. Dairy Low-fat dairy products, such as milk, cottage cheese, or yogurt. Beverages Water. Herbal tea. Apple juice. Sweets and Desserts Pudding. Custard. Fruit gelatin. Ice cream. Fats and Oils Mild salad dressings. Canola or olive oil. The items listed above may not be a complete list of allowed foods or beverages. Contact your dietitian for more options. What foods are not recommended? Foods and ingredients that are often not recommended include: Spicy foods, such as hot sauce or salsa. Fried foods. Sour  foods, such as pickled or fermented foods. Raw vegetables or fruits, especially citrus or berries. Caffeinated drinks. Alcohol. Strongly flavored seasonings or condiments.  The items listed above may not be a complete list of foods and beverages that are not allowed. Contact your dietitian for more information. This information is not intended to replace advice given to you by your health care provider. Make sure you discuss any questions you have with your health care provider. Document Released: 09/17/2015 Document Revised: 11/01/2015 Document Reviewed: 06/07/2014 Elsevier Interactive Patient Education  2018 [employer] Gastritis, Adult Gastritis is inflammation of the stomach. There are two kinds of gastritis:  Acute gastritis. This kind develops suddenly.  Chronic gastritis. This kind lasts for a long time.  Gastritis happens when the lining of the stomach becomes weak or gets damaged. Without treatment, gastritis can lead to stomach bleeding and ulcers. What are the causes? This condition may be caused by:  An infection.  Drinking too much alcohol.  Certain medicines.  Having too much acid in the stomach.  A disease of the intestines or stomach.  Stress.  What are the signs or symptoms? Symptoms of this condition include:  Pain or a burning in the upper abdomen.  Nausea.  Vomiting.  An uncomfortable feeling of fullness after eating.  In some cases, there are no symptoms. How is this diagnosed? This condition may be diagnosed with:  A description of your symptoms.  A physical exam.  Tests. These can include: ? Blood tests. ? Stool tests. ? A test in which a thin, flexible instrument with a light  and camera on the end is passed down the esophagus and into the stomach (upper endoscopy). ? A test in which a sample of tissue is taken for testing (biopsy).  How is this treated? This condition may be treated with medicines. If the condition is caused by a  bacterial infection, you may be given antibiotic medicines. If it is caused by too much acid in the stomach, you may get medicines called H2 blockers, proton pump inhibitors, or antacids. Treatment may also involve stopping the use of certain medicines, such as aspirin, ibuprofen, or other nonsteroidal anti-inflammatory drugs (NSAIDs). Follow these instructions at home:  Take over-the-counter and prescription medicines only as told by your health care provider.  If you were prescribed an antibiotic, take it as told by your health care provider. Do not stop taking the antibiotic even if you start to feel better.  Drink enough fluid to keep your urine clear or pale yellow.  Eat small, frequent meals instead of large meals. Contact a health care provider if:  Your symptoms get worse.  Your symptoms return after treatment. Get help right away if:  You vomit blood or material that looks like coffee grounds.  You have black or dark red stools.  You are unable to keep fluids down.  Your abdominal pain gets worse.  You have a fever.  You do not feel better after 1 week. This information is not intended to replace advice given to you by your health care provider. Make sure you discuss any questions you have with your health care provider. Document Released: 05/20/2001 Document Revised: 01/23/2016 Document Reviewed: 02/17/2015 Elsevier Interactive Patient Education  [employer].

## 2017-01-26 ENCOUNTER — Telehealth: Payer: Self-pay

## 2017-01-26 NOTE — Telephone Encounter (Signed)
done

## 2017-01-26 NOTE — Telephone Encounter (Signed)
lvm informing pt that script has been called into [place]

## 2017-01-26 NOTE — Telephone Encounter (Signed)
Erika Martinez called to say she was going for a test for her stomach today and cannot attend [name]. She states she will be back as soon as she can.

## 2017-01-26 NOTE — Telephone Encounter (Signed)
Pt calling checking status on her medication. States she was in pain all weekend. Please send to [place]

## 2017-01-26 NOTE — Telephone Encounter (Signed)
Call in the medication, just like they have at [facility]

## 2017-01-27 LAB — LIPASE: Lipase: 29 U/L (ref 7–60)

## 2017-01-27 LAB — H. PYLORI BREATH TEST: H. PYLORI BREATH TEST: NOT DETECTED

## 2017-01-27 LAB — AMYLASE: AMYLASE: 46 U/L (ref 21–101)

## 2017-01-27 NOTE — Telephone Encounter (Signed)
Called pt again today and she stated that she picked up her prescription today.

## 2017-01-28 ENCOUNTER — Telehealth: Payer: Self-pay | Admitting: Family Medicine

## 2017-01-28 NOTE — Telephone Encounter (Signed)
Erika Martinez stated she has not been able to contact pt and that she must discharged

## 2017-01-28 NOTE — Telephone Encounter (Signed)
Please call patient and ask [facility] to contact them back. Thank you

## 2017-01-28 NOTE — Telephone Encounter (Signed)
Erika Martinez from [facility] called stating she has not been able to get in touch with patient for her last 2 visits. [phone]

## 2017-02-02 DIAGNOSIS — Z942 Lung transplant status: Secondary | ICD-10-CM

## 2017-02-02 NOTE — Progress Notes (Signed)
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 coughing and/or vibration techniques. Create an Sports administrator.   Pulmonary Rehab from 02/01/2016 in [facility] Cardiac and Pulmonary Rehab  Date  01/21/16  Educator  LB  Instruction Review Code (retired)  2- meets goals/outcomes      Oxygen: - Individual and group verbal and written instruction on oxygen therapy. Includes supplement oxygen, available portable oxygen systems, continuous and intermittent flow rates, oxygen safety, concentrators, and Medicare reimbursement for oxygen.   Documentation from 03/14/2015 in [place]  Date  12/05/14  Educator  LB   Instruction Review Code (retired)  2- meets goals/outcomes      Respiratory Medications: - Group verbal and written instruction to review medications for lung disease. Drug class, frequency, complications, importance of spacers, rinsing mouth after steroid MDI's, and proper cleaning methods for nebulizers.   Documentation from 03/14/2015 in [place]  Date  12/22/14  Educator  [place]  Instruction Review Code (retired)  2- meets Theatre manager and instructions given to pt. ]      AED/CPR: - Group verbal and written instruction with the use of models to demonstrate the basic use of the AED with the basic ABC's of resuscitation.   Pulmonary Rehab from 02/01/2016 in [facility] Cardiac and Pulmonary Rehab  Date  01/11/16  Educator  CE  Instruction Review Code (retired)  2- meets goals/outcomes      Breathing Retraining: - Provides individuals verbal and written instruction on purpose, frequency, and proper technique of diaphragmatic breathing and pursed-lipped breathing. Applies individual practice skills.   Pulmonary Rehab from 01/21/2017 in [facility] Cardiac and Pulmonary Rehab  Date  01/12/17  Educator  [facility]  Instruction Review Code (retired)  2- meets Designer, fashion/clothing and Physiology of the Lungs: - Group verbal and written instruction with the use of models to provide basic lung anatomy and physiology related to function, structure and complications of lung disease.   Pulmonary Rehab from 02/01/2016 in [facility] Cardiac and Pulmonary Rehab  Date  02/01/16  Educator  [place]  Instruction Review Code (retired)  2- meets Designer, fashion/clothing & Physiology of the Heart: - Group verbal and written instruction and models provide basic cardiac anatomy and physiology, with the coronary electrical and arterial systems. Review of: AMI, Angina, Valve disease, Heart Failure, Cardiac Arrhythmia, Pacemakers, and the ICD.   Documentation from 03/14/2015 in  [place]  Date  12/08/14  Educator  CE  Instruction Review Code (retired)  2- meets goals/outcomes      Heart Failure: - Group verbal and written instruction on the basics of heart failure: signs/symptoms, treatments, explanation of ejection fraction, enlarged heart and cardiomyopathy.   Pulmonary Rehab from 02/01/2016 in [facility] Cardiac and Pulmonary Rehab  Date  01/25/16  Educator  CE  Instruction Review Code (retired)  2- meets goals/outcomes      Sleep Apnea: - Individual verbal and written instruction to review Obstructive Sleep Apnea. Review of risk factors, methods for diagnosing and types of masks and machines for OSA.   Anxiety: - Provides group, verbal and written instruction on the correlation between heart/lung disease and anxiety, treatment options, and management of anxiety.   Documentation from 03/14/2015 in [place]  Date  01/31/15  Educator  [facility]  Instruction Review Code (retired)  2- Water quality scientist      Relaxation: - Provides group, verbal and written instruction about the benefits of relaxation for patients with heart/lung disease. Also provides patients with examples of relaxation techniques.   Pulmonary Rehab from 02/01/2016 in [facility] Cardiac and Pulmonary  Rehab  Date  01/16/16  Educator  Kathreen Cornfield, [facility]  Instruction Review Code (retired)  2- Meets goals/outcomes      Cardiac Medications: - Group verbal and written instruction to review commonly prescribed medications for heart disease. Reviews the medication, class of the drug, and side effects.   Documentation from 03/14/2015 in [place]  Date  12/22/14  Educator  CE  Instruction Review Code (retired)  2- meets goals/outcomes      Know Your Numbers: -Group verbal and written instruction about important numbers in your health.  Review of Cholesterol, Blood Pressure, Diabetes, and BMI  and the role they play in your overall health.   Other: -Provides group and verbal instruction on various topics (see comments)    Knowledge Questionnaire Score:     Knowledge Questionnaire Score - 01/12/17 1520      Knowledge Questionnaire Score   Pre Score 9/10       Core Components/Risk Factors/Patient Goals at Admission:     Personal Goals and Risk Factors at Admission - 01/12/17 1527      Core Components/Risk Factors/Patient Goals on Admission    Weight Management Yes;Weight Gain   Intervention Weight Management: Develop a combined nutrition and exercise program designed to reach desired caloric intake, while maintaining appropriate intake of nutrient and fiber, sodium and fats, and appropriate energy expenditure required for the weight goal.;Weight Management: Provide education and appropriate resources to help participant work on and attain dietary goals.;Weight Management/Obesity: Establish reasonable short term and long term weight goals.   Admit Weight 111 lb (50.3 kg)   Goal Weight: Short Term 116 lb (52.6 kg)   Goal Weight: Long Term 120 lb (54.4 kg)   Expected Outcomes Short Term: Continue to assess and modify interventions until short term weight is achieved;Long Term: Adherence to nutrition and physical activity/exercise program aimed toward attainment of established weight goal;Weight Maintenance: Understanding of the daily nutrition guidelines, which includes 25-35% calories from fat, 7% or less cal from saturated fats, less than 200mg  cholesterol, less than 1.5gm of sodium, & 5 or more servings of fruits and vegetables daily;Understanding recommendations for meals to include 15-35% energy as protein, 25-35% energy from fat, 35-60% energy from carbohydrates, less than 200mg  of dietary cholesterol, 20-35 gm of total fiber daily;Understanding of distribution of calorie intake throughout the day with the consumption of 4-5 meals/snacks;Weight Gain: Understanding of general  recommendations for a high calorie, high protein meal plan that promotes weight gain by distributing calorie intake throughout the day with the consumption for 4-5 meals, snacks, and/or supplements   Improve shortness of breath with ADL's Yes   Intervention Provide education, individualized exercise plan and daily activity instruction to help decrease symptoms of SOB with activities of daily living.   Expected Outcomes Short Term: Achieves a reduction of symptoms when performing activities of daily living.   Develop more efficient breathing techniques such as purse lipped breathing and diaphragmatic breathing; and practicing self-pacing with activity Yes   Intervention Provide education, demonstration and support about specific breathing techniuqes utilized for more efficient breathing. Include techniques such as pursed lipped breathing, diaphragmatic breathing and self-pacing activity.   Expected Outcomes Short Term: Participant will be able to demonstrate and use breathing techniques as needed throughout daily activities.   Increase knowledge of respiratory medications and ability to use respiratory devices properly  Yes   Intervention Provide education and demonstration as needed of appropriate use of medications, inhalers, and oxygen therapy.  Expected Outcomes Short Term: Achieves understanding of medications use. Understands that oxygen is a medication prescribed by physician. Demonstrates appropriate use of inhaler and oxygen therapy.   Personal Goal Wants to be able to build her pulmonary support group   Intervention Meet more than once a month with her pulmonary support group   Expected Outcomes Short: Meet with pulmonary support group. Long: Make the pulmonary support group expand.      Core Components/Risk Factors/Patient Goals Review:    Core Components/Risk Factors/Patient Goals at Discharge (Final Review):    ITP Comments:     ITP Comments    Row Name 01/12/17 1649 02/02/17  1206         ITP Comments Medical Evaluation completed. Chart sent to Dr. Emily Filbert director of [place] for review and changes. Diagnosis can be found in [facility] diagnosis. 30 day note chart sent to Dr. Sabra Heck Director of [place] for review and changes.         Comments: 30 day review

## 2017-02-03 DIAGNOSIS — T86818 Other complications of lung transplant: Secondary | ICD-10-CM | POA: Diagnosis not present

## 2017-02-03 DIAGNOSIS — T86810 Lung transplant rejection: Secondary | ICD-10-CM | POA: Diagnosis not present

## 2017-02-03 DIAGNOSIS — N179 Acute kidney failure, unspecified: Secondary | ICD-10-CM | POA: Diagnosis not present

## 2017-02-03 DIAGNOSIS — Z942 Lung transplant status: Secondary | ICD-10-CM | POA: Diagnosis not present

## 2017-02-03 DIAGNOSIS — R918 Other nonspecific abnormal finding of lung field: Secondary | ICD-10-CM | POA: Diagnosis not present

## 2017-02-03 DIAGNOSIS — Z79899 Other long term (current) drug therapy: Secondary | ICD-10-CM | POA: Diagnosis not present

## 2017-02-03 DIAGNOSIS — Z4824 Encounter for aftercare following lung transplant: Secondary | ICD-10-CM | POA: Diagnosis not present

## 2017-02-03 DIAGNOSIS — J988 Other specified respiratory disorders: Secondary | ICD-10-CM | POA: Diagnosis not present

## 2017-02-03 DIAGNOSIS — D849 Immunodeficiency, unspecified: Secondary | ICD-10-CM | POA: Diagnosis not present

## 2017-02-04 ENCOUNTER — Encounter: Payer: Self-pay | Admitting: *Deleted

## 2017-02-04 DIAGNOSIS — Z942 Lung transplant status: Secondary | ICD-10-CM

## 2017-02-11 ENCOUNTER — Encounter: Payer: 59 | Attending: Family Medicine

## 2017-02-11 DIAGNOSIS — Z942 Lung transplant status: Secondary | ICD-10-CM | POA: Insufficient documentation

## 2017-02-12 ENCOUNTER — Other Ambulatory Visit: Payer: Self-pay | Admitting: Family Medicine

## 2017-02-12 NOTE — Telephone Encounter (Signed)
Patient requesting refill of Omeprazole to [facility]. 

## 2017-02-16 DIAGNOSIS — Z942 Lung transplant status: Secondary | ICD-10-CM | POA: Diagnosis not present

## 2017-02-16 NOTE — Progress Notes (Signed)
Daily Session Note  Patient Details  Name: Erika Martinez MRN: [id number] Date of Birth: Mar 27, 1956 Referring Provider:     Pulmonary Rehab from 01/12/2017 in [facility] Cardiac and Pulmonary Rehab  Referring Provider  Steele Sizer MD      Encounter Date: 02/16/2017  Check In:     Session Check In - 02/16/17 1152      Check-In   Staff Present Justin Mend Jaci Carrel, BS, ACSM CEP, Exercise Physiologist;Amanda Oletta Darter, [place], ACSM CEP, Exercise Physiologist   Supervising physician immediately available to respond to emergencies [name] immediately available ER MD   Physician(s) Dr. Corky Downs and Jimmye Norman   Medication changes reported     No   Fall or balance concerns reported    No   Warm-up and Cool-down Performed as group-led instruction   Resistance Training Performed Yes   VAD Patient? No     Pain Assessment   Currently in Pain? No/denies   Multiple Pain Sites No         History  Smoking Status  . Never Smoker  Smokeless Tobacco  . Never Used    Goals Met:  Independence with exercise equipment Exercise tolerated well No report of cardiac concerns or symptoms Strength training completed today  Goals Unmet:  Not Applicable  Comments: Pt able to follow exercise prescription today without complaint.  Will continue to monitor for progression.   Dr. Emily Filbert is Medical Director for [place] and [name].

## 2017-02-18 ENCOUNTER — Other Ambulatory Visit: Payer: Self-pay | Admitting: Family Medicine

## 2017-02-18 DIAGNOSIS — R1013 Epigastric pain: Secondary | ICD-10-CM

## 2017-02-18 DIAGNOSIS — Z942 Lung transplant status: Secondary | ICD-10-CM

## 2017-02-18 DIAGNOSIS — R110 Nausea: Secondary | ICD-10-CM

## 2017-02-18 MED ORDER — ONDANSETRON HCL 4 MG PO TABS: 4.0000 mg | ORAL_TABLET | Freq: Three times a day (TID) | ORAL | 0 refills | Status: AC | PRN

## 2017-02-18 NOTE — Telephone Encounter (Signed)
Pt would like to know if she can get a refill on the Zophran for her nausea. [facility] also wanted to let Dr Ancil Boozer know she is getting a Endoscopy next week.

## 2017-02-18 NOTE — Progress Notes (Signed)
Daily Session Note  Patient Details  Name: Erika Martinez MRN: [id number] Date of Birth: 08-Dec-1955 Referring Provider:     Pulmonary Rehab from 01/12/2017 in [facility] Cardiac and Pulmonary Rehab  Referring Provider  Steele Sizer MD      Encounter Date: 02/18/2017  Check In:     Session Check In - 02/18/17 1131      Check-In   Location [facility]-Cardiac & Pulmonary Rehab   Staff Present Alberteen Sam, MA, ACSM RCEP, Exercise Physiologist;Meredith Sherryll Burger, RN BSN;Benjermin Korber Flavia Shipper   Supervising physician immediately available to respond to emergencies [name] immediately available ER MD   Physician(s) Dr. Alfred Levins and Jimmye Norman   Medication changes reported     No   Fall or balance concerns reported    No   Warm-up and Cool-down Performed as group-led instruction   Resistance Training Performed Yes   VAD Patient? No     Pain Assessment   Currently in Pain? No/denies   Multiple Pain Sites No           Exercise Prescription Changes - 02/17/17 1400      Response to Exercise   Blood Pressure (Admit) 119/64   Blood Pressure (Exit) 110/64   Heart Rate (Admit) 99 bpm   Heart Rate (Exercise) 75 bpm   Heart Rate (Exit) 75 bpm   Oxygen Saturation (Admit) 99 %   Oxygen Saturation (Exercise) 96 %   Oxygen Saturation (Exit) 100 %   Rating of Perceived Exertion (Exercise) 13   Perceived Dyspnea (Exercise) 2   Comments third day of exercise   Duration Continue with 45 min of aerobic exercise without signs/symptoms of physical distress.   Intensity THRR unchanged     Progression   Progression Continue to progress workloads to maintain intensity without signs/symptoms of physical distress.   Average METs 1.46     Resistance Training   Training Prescription Yes   Weight 3 lbs   Reps 10-15     Interval Training   Interval Training No     NuStep   Level 1   SPM 52   Minutes 15   METs 1.2     REL-XR   Level 1   Speed 21   Minutes 15   METs 1.5     Track   Laps 15   Minutes 15   METs 1.7      History  Smoking Status  . Never Smoker  Smokeless Tobacco  . Never Used    Goals Met:  Independence with exercise equipment Exercise tolerated well Personal goals reviewed No report of cardiac concerns or symptoms Strength training completed today  Goals Unmet:  Not Applicable  Comments: Pt able to follow exercise prescription today without complaint.  Will continue to monitor for progression.  Reviewed home exercise with pt today.  Pt plans to walk [place] for exercise.  She is going to build up to being able to walk entire [place].  For the rest of the 30 min she is going to use her recumbent bike and possibly go to complex gym.  Reviewed THR, pulse, RPE, sign and symptoms, and when to call 911 or MD.  Also discussed weather considerations and indoor options.  Pt voiced understanding.  Dr. Emily Filbert is Medical Director for [place] and [name].

## 2017-02-25 ENCOUNTER — Encounter: Payer: 59 | Admitting: *Deleted

## 2017-02-25 DIAGNOSIS — Z942 Lung transplant status: Secondary | ICD-10-CM | POA: Diagnosis not present

## 2017-02-25 NOTE — Progress Notes (Signed)
Daily Session Note  Patient Details  Name: Erika Martinez MRN: [id number] Date of Birth: 16-Aug-1955 Referring Provider:     Pulmonary Rehab from 01/12/2017 in [facility] Cardiac and Pulmonary Rehab  Referring Provider  Steele Sizer MD      Encounter Date: 02/25/2017  Check In:     Session Check In - 02/25/17 1129      Check-In   Location [facility]-Cardiac & Pulmonary Rehab   Staff Present Renita Papa, RN BSN;Joseph Darrin Nipper, [place], ACSM RCEP, Exercise Physiologist   Supervising physician immediately available to respond to emergencies [name] immediately available ER MD   Physician(s) Dr. Joni Fears and Jimmye Norman   Medication changes reported     No   Fall or balance concerns reported    No   Warm-up and Cool-down Performed on first and last piece of equipment   Resistance Training Performed Yes   VAD Patient? No     Pain Assessment   Currently in Pain? No/denies         History  Smoking Status  . Never Smoker  Smokeless Tobacco  . Never Used    Goals Met:  Proper associated with RPD/PD & O2 Sat Independence with exercise equipment Using PLB without cueing & demonstrates good technique Exercise tolerated well Personal goals reviewed Strength training completed today  Goals Unmet:  Not Applicable  Comments: Pt able to follow exercise prescription today without complaint.  Will continue to monitor for progression.    Dr. Emily Filbert is Medical Director for [name] and [name].

## 2017-02-27 ENCOUNTER — Encounter: Payer: 59 | Admitting: *Deleted

## 2017-02-27 DIAGNOSIS — Z942 Lung transplant status: Secondary | ICD-10-CM | POA: Diagnosis not present

## 2017-02-27 NOTE — Progress Notes (Signed)
Daily Session Note  Patient Details  Name: Erika Martinez MRN: [id number] Date of Birth: 1955-06-15 Referring Provider:     Pulmonary Rehab from 01/12/2017 in [facility] Cardiac and Pulmonary Rehab  Referring Provider  Steele Sizer MD      Encounter Date: 02/27/2017  Check In:     Session Check In - 02/27/17 1128      Check-In   Location [facility]-Cardiac & Pulmonary Rehab   Staff Present Alberteen Sam, MA, ACSM RCEP, Exercise Physiologist;Joseph Alcus Dad, RN BSN   Supervising physician immediately available to respond to emergencies [name] immediately available ER MD   Physician(s) Dr. Archie Balboa and Joni Fears    Medication changes reported     No   Fall or balance concerns reported    No   Warm-up and Cool-down Performed as group-led instruction   Resistance Training Performed Yes   VAD Patient? No     Pain Assessment   Currently in Pain? No/denies         History  Smoking Status  . Never Smoker  Smokeless Tobacco  . Never Used    Goals Met:  Proper associated with RPD/PD & O2 Sat Independence with exercise equipment Using PLB without cueing & demonstrates good technique Exercise tolerated well Strength training completed today  Goals Unmet:  Not Applicable  Comments: Pt able to follow exercise prescription today without complaint.  Will continue to monitor for progression.    Dr. Emily Filbert is Medical Director for [place] and [name].

## 2017-03-02 DIAGNOSIS — Z942 Lung transplant status: Secondary | ICD-10-CM

## 2017-03-02 NOTE — Progress Notes (Signed)
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 acid reflux.  She denies  a history of depression or anxiety but was aware the Dr. put her on a medication for anxiety post-surgery.  She recently d/c that medication as it is no longer needed.  Her mood is more depressed lately with lack of energy - unable to go to work yet and feels like she has "no life."  Erika Martinez is "forcing herself to come to this class even though her stomach hurts all the time and she is tired most of the time.  She has goals to increase her strength and get strong enough to eliminate use of the walker.  She also would like to put some weight back on as she feels like she is "withering away."  Counselor recommended a book for Erika Martinez to encourage her and give her hope in this situation.  Counselor will be following with her throughout the course of this program.     Expected Outcomes Erika Martinez will benefit from consistent exercise to  achieve her stated goals.  The educational and psychoeducational components of this program will be helpful in managing and coping through her recovery.  [place] will also benefit from meeting with the dietician to address her weight gain goals.     Continue Psychosocial Services  Follow up required by counselor      Psychosocial Re-Evaluation:     Psychosocial Re-Evaluation    [place] Name 02/25/17 1234             Psychosocial Re-Evaluation   Comments Counselor follow up with Erika Martinez reporting she got the book yesterday and has been reading and listening to the inspirational recommended playlist and this has been helpful for her during this stressful time.  Counselor commended her for this aspect of her self-care.         Expected Outcomes Erika Martinez will continue to practice positive self care strategies to improve her mood and decrease her stress levels.            Psychosocial Discharge (Final Psychosocial Re-Evaluation):     Psychosocial Re-Evaluation - 02/25/17 1234      Psychosocial Re-Evaluation   Comments Counselor follow up with Erika Martinez reporting she got the book yesterday and has been reading and listening to the inspirational recommended playlist and this has been helpful for her during this stressful time.  Counselor commended her for this aspect of her self-care.     Expected Outcomes Jarelis will continue to practice positive self care strategies to improve her mood and decrease her stress levels.        Education: Education Goals: Education classes will be provided on a weekly basis, covering required topics. Participant will state understanding/return demonstration of topics presented.  Learning Barriers/Preferences:     Learning Barriers/Preferences - 01/12/17 1653      Learning Barriers/Preferences   Learning Barriers Sight  wears reading glasses   Learning Preferences Group Instruction;Individual Instruction;Pictoral;Skilled Demonstration;Verbal  Instruction;Video;Written Material      Education Topics: Initial Evaluation Education: - Verbal, written and demonstration of respiratory meds, RPE/PD scales, oximetry and breathing techniques. Instruction on use of nebulizers and MDIs: cleaning and proper use, rinsing mouth with steroid doses and importance of monitoring MDI activations.   Pulmonary Rehab from 02/27/2017 in [facility] Cardiac and Pulmonary Rehab  Date  01/12/17  Educator  [facility]  Instruction Review Code (retired)  2- meets goals/outcomes      General Nutrition Guidelines/Fats and Fiber: -Group instruction provided by verbal, written material, models and posters to present the general guidelines for heart healthy nutrition. Gives an  explanation and review of dietary fats and fiber.   Pulmonary Rehab from 02/27/2017 in [facility] Cardiac and Pulmonary Rehab  Date  01/12/17  Educator  [facility]      Controlling Sodium/Reading Food Labels: -Group verbal and written material supporting the discussion of sodium use in heart healthy nutrition. Review and explanation with models, verbal and written materials for utilization of the food label.   Pulmonary Rehab from 02/01/2016 in [facility] Cardiac and Pulmonary Rehab  Date  01/07/16  Educator  CR  Instruction Review Code (retired)  2- meets goals/outcomes      Exercise Physiology & Risk Factors: - Group verbal and written instruction with models to review the exercise physiology of the cardiovascular system and associated critical values. Details cardiovascular disease risk factors and the goals associated with each risk factor.   Pulmonary Rehab from 02/01/2016 in [facility] Cardiac and Pulmonary Rehab  Date  12/26/15  Educator  Alberteen Sam and Nada Maclachlan   Instruction Review Code (retired)  2- meets goals/outcomes      Aerobic Exercise & Resistance Training: - Gives group verbal and written discussion on the health impact of inactivity. On the components of aerobic and resistive training  programs and the benefits of this training and how to safely progress through these programs.   Flexibility, Balance, General Exercise Guidelines: - Provides group verbal and written instruction on the benefits of flexibility and balance training programs. Provides general exercise guidelines with specific guidelines to those with heart or lung disease. Demonstration and skill practice provided.   Pulmonary Rehab from 02/27/2017 in [facility] Cardiac and Pulmonary Rehab  Date  01/21/17  Educator  AS      Stress Management: - Provides group verbal and written instruction about the health risks of elevated stress, cause of high stress, and healthy ways to reduce stress.   Pulmonary Rehab from 02/27/2017 in [facility] Cardiac and Pulmonary Rehab  Date  02/25/17  Educator  [facility]  Instruction Review Code  1- Verbalizes Understanding      Depression: - Provides group verbal and written instruction on the correlation between heart/lung disease and depressed mood, treatment options, and the stigmas associated with seeking treatment.   Exercise & Equipment Safety: - Individual verbal instruction and demonstration of equipment use and safety with use of the equipment.   Pulmonary Rehab from 02/01/2016 in [facility] Cardiac and Pulmonary Rehab  Date  12/14/15  Educator  C. [place]  Instruction Review Code (retired)  1- partially meets, needs review/practice      Infection Prevention: - Provides verbal and written material to individual with discussion of infection control including proper hand washing and proper equipment cleaning during exercise session.   Pulmonary Rehab from 02/27/2017 in [facility] Cardiac and Pulmonary Rehab  Date  01/12/17  Educator  [facility]      Falls Prevention: - Provides verbal and written material to individual with discussion of falls prevention and safety.   Pulmonary Rehab from 02/27/2017 in [facility] Cardiac and Pulmonary Rehab  Date  01/12/17  Educator  [facility]  Instruction Review Code  (retired)  2- meets goals/outcomes      Diabetes: - Individual verbal and written instruction to review signs/symptoms of diabetes, desired ranges of glucose level fasting, after meals and with exercise. Advice that pre and post exercise glucose checks will be done for 3 sessions at entry of program.   Chronic Lung Diseases: - Group verbal and written instruction to review new updates, new respiratory medications, new advancements in procedures and treatments.  Provide informative websites and "800" numbers of self-education.   Pulmonary Rehab from 02/01/2016 in [facility] Cardiac and Pulmonary Rehab  Date  12/24/15  Educator  LB  Instruction Review Code (retired)  2- meets goals/outcomes      Lung Procedures: - Group verbal and written instruction to describe testing methods done to diagnose lung disease. Review the outcome of test results. Describe the treatment choices: Pulmonary Function Tests, ABGs and oximetry.   Documentation from 03/14/2015 in [place]  Date  01/12/15  Educator  sj  Instruction Review Code (retired)  2- meets Chief Financial Officer: - Provide group verbal and written instruction for methods to conserve energy, plan and organize activities. Instruct on pacing techniques, use of adaptive equipment and posture/positioning to relieve shortness of breath.   Pulmonary Rehab from 02/27/2017 in [facility] Cardiac and Pulmonary Rehab  Date  02/18/17  Educator  [facility]  Instruction Review Code  1- Verbalizes Understanding      Triggers: - Group verbal and written instruction to review types of environmental controls: home humidity, furnaces, filters, dust mite/pet prevention, HEPA vacuums. To discuss weather changes, air quality and the benefits of nasal washing.   Documentation from 03/14/2015 in [place]  Date  12/18/14  Educator  LB  Instruction Review Code (retired)  2- meets  goals/outcomes      Exacerbations: - Group verbal and written instruction to provide: warning signs, infection symptoms, calling MD promptly, preventive modes, and value of vaccinations. Review: effective airway clearance, coughing and/or vibration techniques. Create an Sports administrator.   Pulmonary Rehab from 02/01/2016 in [facility] Cardiac and Pulmonary Rehab  Date  01/21/16  Educator  LB  Instruction Review Code (retired)  2- meets goals/outcomes      Oxygen: - Individual and group verbal and written instruction on oxygen therapy. Includes supplement oxygen, available portable oxygen systems, continuous and intermittent flow rates, oxygen safety, concentrators, and Medicare reimbursement for oxygen.   Documentation from 03/14/2015 in [place]  Date  12/05/14  Educator  LB  Instruction Review Code (retired)  2- meets goals/outcomes      Respiratory Medications: - Group verbal and written instruction to review medications for lung disease. Drug class, frequency, complications, importance of spacers, rinsing mouth after steroid MDI's, and proper cleaning methods for nebulizers.   Documentation from 03/14/2015 in [place]  Date  12/22/14  Educator  [place]  Instruction Review Code (retired)  2- meets Theatre manager and instructions given to pt. ]      AED/CPR: - Group verbal and written instruction with the use of models to demonstrate the basic use of the AED with the basic ABC's of resuscitation.   Pulmonary Rehab from 02/27/2017 in [facility] Cardiac and Pulmonary Rehab  Date  02/27/17  Educator  CE  Instruction Review Code  1- Verbalizes Understanding      Breathing Retraining: - Provides individuals verbal and written instruction on purpose, frequency, and proper technique of diaphragmatic breathing and pursed-lipped breathing. Applies individual practice skills.   Pulmonary Rehab from 02/27/2017 in [facility]  Cardiac and Pulmonary Rehab  Date  01/12/17  Educator  [facility]      Anatomy and Physiology of the Lungs: - Group verbal and written instruction with the use of models to provide basic lung anatomy and physiology related to function, structure and complications of lung disease.   Pulmonary Rehab from 02/01/2016  in [facility] Cardiac and Pulmonary Rehab  Date  02/01/16  Educator  SJ  Instruction Review Code (retired)  2- meets Building services engineer & Physiology of the Heart: - Group verbal and written instruction and models provide basic cardiac anatomy and physiology, with the coronary electrical and arterial systems. Review of: AMI, Angina, Valve disease, Heart Failure, Cardiac Arrhythmia, Pacemakers, and the ICD.   Documentation from 03/14/2015 in [facility]  Date  12/08/14  Educator  CE  Instruction Review Code (retired)  2- meets goals/outcomes      Heart Failure: - Group verbal and written instruction on the basics of heart failure: signs/symptoms, treatments, explanation of ejection fraction, enlarged heart and cardiomyopathy.   Pulmonary Rehab from 02/01/2016 in [facility] Cardiac and Pulmonary Rehab  Date  01/25/16  Educator  CE  Instruction Review Code (retired)  2- meets goals/outcomes      Sleep Apnea: - Individual verbal and written instruction to review Obstructive Sleep Apnea. Review of risk factors, methods for diagnosing and types of masks and machines for OSA.   Anxiety: - Provides group, verbal and written instruction on the correlation between heart/lung disease and anxiety, treatment options, and management of anxiety.   Pulmonary Rehab from 02/27/2017 in [facility] Cardiac and Pulmonary Rehab  Date  02/25/17  Educator  [facility]  Instruction Review Code  1- Verbalizes Understanding      Relaxation: - Provides group, verbal and written instruction about the benefits of relaxation for patients with heart/lung disease. Also provides patients  with examples of relaxation techniques.   Pulmonary Rehab from 02/01/2016 in [facility] Cardiac and Pulmonary Rehab  Date  01/16/16  Educator  Belva Crome, [employer]  Instruction Review Code (retired)  2- Meets goals/outcomes      Cardiac Medications: - Group verbal and written instruction to review commonly prescribed medications for heart disease. Reviews the medication, class of the drug, and side effects.   Documentation from 03/14/2015 in [facility]  Date  12/22/14  Educator  CE  Instruction Review Code (retired)  2- meets goals/outcomes      Know Your Numbers: -Group verbal and written instruction about important numbers in your health.  Review of Cholesterol, Blood Pressure, Diabetes, and BMI and the role they play in your overall health.   Other: -Provides group and verbal instruction on various topics (see comments)    Knowledge Questionnaire Score:     Knowledge Questionnaire Score - 01/12/17 1520      Knowledge Questionnaire Score   Pre Score 9/10       Core Components/Risk Factors/Patient Goals at Admission:     Personal Goals and Risk Factors at Admission - 01/12/17 1527      Core Components/Risk Factors/Patient Goals on Admission    Weight Management Yes;Weight Gain   Intervention Weight Management: Develop a combined nutrition and exercise program designed to reach desired caloric intake, while maintaining appropriate intake of nutrient and fiber, sodium and fats, and appropriate energy expenditure required for the weight goal.;Weight Management: Provide education and appropriate resources to help participant work on and attain dietary goals.;Weight Management/Obesity: Establish reasonable short term and long term weight goals.   Admit Weight 111 lb (50.3 kg)   Goal Weight: Short Term 116 lb (52.6 kg)   Goal Weight: Long Term 120 lb (54.4 kg)   Expected Outcomes Short Term: Continue to assess and modify interventions until short  term weight is achieved;Long Term: Adherence to  nutrition and physical activity/exercise program aimed toward attainment of established weight goal;Weight Maintenance: Understanding of the daily nutrition guidelines, which includes 25-35% calories from fat, 7% or less cal from saturated fats, less than '200mg'$  cholesterol, less than 1.5gm of sodium, & 5 or more servings of fruits and vegetables daily;Understanding recommendations for meals to include 15-35% energy as protein, 25-35% energy from fat, 35-60% energy from carbohydrates, less than '200mg'$  of dietary cholesterol, 20-35 gm of total fiber daily;Understanding of distribution of calorie intake throughout the day with the consumption of 4-5 meals/snacks;Weight Gain: Understanding of general recommendations for a high calorie, high protein meal plan that promotes weight gain by distributing calorie intake throughout the day with the consumption for 4-5 meals, snacks, and/or supplements   Improve shortness of breath with ADL's Yes   Intervention Provide education, individualized exercise plan and daily activity instruction to help decrease symptoms of SOB with activities of daily living.   Expected Outcomes Short Term: Achieves a reduction of symptoms when performing activities of daily living.   Develop more efficient breathing techniques such as purse lipped breathing and diaphragmatic breathing; and practicing self-pacing with activity Yes   Intervention Provide education, demonstration and support about specific breathing techniuqes utilized for more efficient breathing. Include techniques such as pursed lipped breathing, diaphragmatic breathing and self-pacing activity.   Expected Outcomes Short Term: Participant will be able to demonstrate and use breathing techniques as needed throughout daily activities.   Increase knowledge of respiratory medications and ability to use respiratory devices properly  Yes   Intervention Provide education and  demonstration as needed of appropriate use of medications, inhalers, and oxygen therapy.   Expected Outcomes Short Term: Achieves understanding of medications use. Understands that oxygen is a medication prescribed by physician. Demonstrates appropriate use of inhaler and oxygen therapy.   Personal Goal Wants to be able to build her pulmonary support group   Intervention Meet more than once a month with her pulmonary support group   Expected Outcomes Short: Meet with pulmonary support group. Long: Make the pulmonary support group expand.      Core Components/Risk Factors/Patient Goals Review:      Goals and Risk Factor Review    Row Name 02/18/17 1204             Core Components/Risk Factors/Patient Goals Review   Personal Goals Review Weight Management/Obesity;Improve shortness of breath with ADL's;Other       Review Weight is finally starting to come up.  She hasn't been able to eat but appetite is starting to come back which will help.  She is able to do most of the things around the house, but her breathing is not the limiting factor; it's her stamina!! Still wants to get support group going but Erika Martinez and other group members have been sick as well.       Expected Outcomes Short: Work on weight gain.  Long: Work on getting support group back together.           Core Components/Risk Factors/Patient Goals at Discharge (Final Review):      Goals and Risk Factor Review - 02/18/17 1204      Core Components/Risk Factors/Patient Goals Review   Personal Goals Review Weight Management/Obesity;Improve shortness of breath with ADL's;Other   Review Weight is finally starting to come up.  She hasn't been able to eat but appetite is starting to come back which will help.  She is able to do most of the things around the house, but  her breathing is not the limiting factor; it's her stamina!! Still wants to get support group going but Erika Martinez and other group members have been sick as well.    Expected Outcomes Short: Work on weight gain.  Long: Work on getting support group back together.       ITP Comments:     ITP Comments    Row Name 01/12/17 1649 02/02/17 1206 03/02/17 0834       ITP Comments Medical Evaluation completed. Chart sent to Dr. Emily Filbert director of [place] for review and changes. Diagnosis can be found in [facility] diagnosis. 30 day note chart sent to Dr. Sabra Heck Director of [place] for review and changes. 30 day review completed. ITP sent to Dr. Emily Filbert Director of [place]. Continue with ITP unless changes are made by physician.          Comments: 30 day review

## 2017-03-04 DIAGNOSIS — Z942 Lung transplant status: Secondary | ICD-10-CM | POA: Diagnosis not present

## 2017-03-04 NOTE — Progress Notes (Signed)
Daily Session Note  Patient Details  Name: ALIVEAH GALLANT MRN: [id number] Date of Birth: 18-Mar-1956 Referring Provider:     Pulmonary Rehab from 01/12/2017 in [facility] Cardiac and Pulmonary Rehab  Referring Provider  Steele Sizer MD      Encounter Date: 03/04/2017  Check In:     Session Check In - 03/04/17 1138      Check-In   Location [facility]-Cardiac & Pulmonary Rehab   Staff Present Alberteen Sam, MA, ACSM RCEP, Exercise Physiologist;Meredith Sherryll Burger, RN BSN;Kayelynn Abdou Flavia Shipper   Supervising physician immediately available to respond to emergencies [name] immediately available ER MD   Physician(s) Dr. Joni Fears and Burlene Arnt   Medication changes reported     No   Fall or balance concerns reported    No   Warm-up and Cool-down Performed as group-led instruction   Resistance Training Performed Yes   VAD Patient? No     Pain Assessment   Currently in Pain? No/denies   Multiple Pain Sites No           Exercise Prescription Changes - 03/03/17 1500      Response to Exercise   Blood Pressure (Admit) 130/70   Blood Pressure (Exit) 124/74   Heart Rate (Admit) 74 bpm   Heart Rate (Exercise) 96 bpm   Heart Rate (Exit) 70 bpm   Oxygen Saturation (Admit) 99 %   Oxygen Saturation (Exercise) 94 %   Oxygen Saturation (Exit) 99 %   Rating of Perceived Exertion (Exercise) 12   Perceived Dyspnea (Exercise) 1   Symptoms fatigue   Duration Continue with 45 min of aerobic exercise without signs/symptoms of physical distress.   Intensity THRR unchanged     Progression   Progression Continue to progress workloads to maintain intensity without signs/symptoms of physical distress.   Average METs 1.25     Resistance Training   Training Prescription Yes   Weight 3 lbs   Reps 10-15     Interval Training   Interval Training No     NuStep   Level 1   SPM 77   Minutes 15   METs 1.4     REL-XR   Level 1   Speed 26   Minutes 15   METs 1.1     Track   Laps --  has not  been walking     Home Exercise Plan   Plans to continue exercise at Home (comment)  walking, recumbent bike, complex gym   Frequency Add 1 additional day to program exercise sessions.   Initial Home Exercises Provided 02/18/17      History  Smoking Status  . Never Smoker  Smokeless Tobacco  . Never Used    Goals Met:  Independence with exercise equipment Exercise tolerated well No report of cardiac concerns or symptoms Strength training completed today  Goals Unmet:  Not Applicable  Comments: Pt able to follow exercise prescription today without complaint.  Will continue to monitor for progression.   Dr. Emily Filbert is Medical Director for [place] and [name].

## 2017-03-09 ENCOUNTER — Encounter: Payer: 59 | Attending: Family Medicine

## 2017-03-09 DIAGNOSIS — R110 Nausea: Secondary | ICD-10-CM | POA: Diagnosis not present

## 2017-03-09 DIAGNOSIS — K2950 Unspecified chronic gastritis without bleeding: Secondary | ICD-10-CM | POA: Diagnosis not present

## 2017-03-09 DIAGNOSIS — K3189 Other diseases of stomach and duodenum: Secondary | ICD-10-CM | POA: Diagnosis not present

## 2017-03-09 DIAGNOSIS — R12 Heartburn: Secondary | ICD-10-CM | POA: Diagnosis not present

## 2017-03-09 DIAGNOSIS — K2980 Duodenitis without bleeding: Secondary | ICD-10-CM | POA: Diagnosis not present

## 2017-03-09 DIAGNOSIS — Z942 Lung transplant status: Secondary | ICD-10-CM | POA: Diagnosis not present

## 2017-03-11 ENCOUNTER — Encounter: Payer: 59 | Admitting: *Deleted

## 2017-03-11 DIAGNOSIS — Z942 Lung transplant status: Secondary | ICD-10-CM | POA: Diagnosis present

## 2017-03-11 NOTE — Progress Notes (Signed)
Daily Session Note  Patient Details  Name: Erika Martinez MRN: [id number] Date of Birth: [birth year] Referring Provider:     Pulmonary Rehab from 01/12/2017 in [facility] Cardiac and Pulmonary Rehab  Referring Provider  Steele Sizer MD      Encounter Date: 03/11/2017  Check In:     Session Check In - 03/11/17 1144      Check-In   Location [facility]-Cardiac & Pulmonary Rehab   Staff Present Renita Papa, RN BSN;Joseph Darrin Nipper, [place], ACSM RCEP, Exercise Physiologist   Supervising physician immediately available to respond to emergencies [name] immediately available ER MD   Physician(s) Dr. Clearnce Hasten and Jimmye Norman   Medication changes reported     No   Fall or balance concerns reported    No   Warm-up and Cool-down Performed as group-led instruction   Resistance Training Performed Yes   VAD Patient? No     Pain Assessment   Currently in Pain? No/denies         History  Smoking Status  . Never Smoker  Smokeless Tobacco  . Never Used    Goals Met:  Proper associated with RPD/PD & O2 Sat Independence with exercise equipment Using PLB without cueing & demonstrates good technique Exercise tolerated well Strength training completed today  Goals Unmet:  Not Applicable  Comments: Pt able to follow exercise prescription today without complaint.  Will continue to monitor for progression.   Dr. Emily Filbert is Medical Director for [place] and [name].

## 2017-03-13 DIAGNOSIS — K219 Gastro-esophageal reflux disease without esophagitis: Secondary | ICD-10-CM | POA: Diagnosis not present

## 2017-03-16 DIAGNOSIS — Z942 Lung transplant status: Secondary | ICD-10-CM

## 2017-03-16 NOTE — Progress Notes (Signed)
Daily Session Note  Patient Details  Name: Erika Martinez MRN: [id number] Date of Birth: 29-Jun-1955 Referring Provider:     Pulmonary Rehab from 01/12/2017 in [facility]  Referring Provider  Steele Sizer MD      Encounter Date: 03/16/2017  Check In:     Session Check In - 03/16/17 1158      Check-In   Location [facility]   Staff Present Nada Maclachlan, BA, ACSM CEP, Exercise Physiologist;Kelly Amedeo Plenty, BS, ACSM CEP, Exercise Physiologist;Deakin Lacek Flavia Shipper   Supervising physician immediately available to respond to emergencies [name] immediately available ER MD   Physician(s) Dr. Jimmye Norman and [facility]   Medication changes reported     No   Fall or balance concerns reported    No   Warm-up and Cool-down Performed as group-led instruction   Resistance Training Performed Yes   VAD Patient? No     Pain Assessment   Currently in Pain? No/denies   Multiple Pain Sites No         History  Smoking Status  . Never Smoker  Smokeless Tobacco  . Never Used    Goals Met:  Independence with exercise equipment Exercise tolerated well No report of cardiac concerns or symptoms Strength training completed today  Goals Unmet:  Not Applicable  Comments: Pt able to follow exercise prescription today without complaint.  Will continue to monitor for progression.   Dr. Emily Filbert is Medical Director for [place] and [name].

## 2017-03-17 DIAGNOSIS — R918 Other nonspecific abnormal finding of lung field: Secondary | ICD-10-CM | POA: Diagnosis not present

## 2017-03-17 DIAGNOSIS — Z4824 Encounter for aftercare following lung transplant: Secondary | ICD-10-CM | POA: Diagnosis not present

## 2017-03-17 DIAGNOSIS — N183 Chronic kidney disease, stage 3 (moderate): Secondary | ICD-10-CM | POA: Diagnosis not present

## 2017-03-17 DIAGNOSIS — Z79899 Other long term (current) drug therapy: Secondary | ICD-10-CM | POA: Diagnosis not present

## 2017-03-17 DIAGNOSIS — Z23 Encounter for immunization: Secondary | ICD-10-CM | POA: Diagnosis not present

## 2017-03-17 DIAGNOSIS — Z942 Lung transplant status: Secondary | ICD-10-CM | POA: Diagnosis not present

## 2017-03-17 DIAGNOSIS — B259 Cytomegaloviral disease, unspecified: Secondary | ICD-10-CM | POA: Diagnosis not present

## 2017-03-17 DIAGNOSIS — I129 Hypertensive chronic kidney disease with stage 1 through stage 4 chronic kidney disease, or unspecified chronic kidney disease: Secondary | ICD-10-CM | POA: Diagnosis not present

## 2017-03-18 DIAGNOSIS — D849 Immunodeficiency, unspecified: Secondary | ICD-10-CM | POA: Diagnosis not present

## 2017-03-18 DIAGNOSIS — Z942 Lung transplant status: Secondary | ICD-10-CM | POA: Diagnosis not present

## 2017-03-18 DIAGNOSIS — J9610 Chronic respiratory failure, unspecified whether with hypoxia or hypercapnia: Secondary | ICD-10-CM | POA: Diagnosis not present

## 2017-03-18 DIAGNOSIS — N183 Chronic kidney disease, stage 3 (moderate): Secondary | ICD-10-CM | POA: Diagnosis not present

## 2017-03-18 DIAGNOSIS — T86812 Lung transplant infection: Secondary | ICD-10-CM | POA: Diagnosis not present

## 2017-03-18 DIAGNOSIS — R768 Other specified abnormal immunological findings in serum: Secondary | ICD-10-CM | POA: Diagnosis not present

## 2017-03-18 DIAGNOSIS — R942 Abnormal results of pulmonary function studies: Secondary | ICD-10-CM | POA: Diagnosis not present

## 2017-03-18 DIAGNOSIS — A0472 Enterocolitis due to Clostridium difficile, not specified as recurrent: Secondary | ICD-10-CM | POA: Diagnosis not present

## 2017-03-18 DIAGNOSIS — N179 Acute kidney failure, unspecified: Secondary | ICD-10-CM | POA: Diagnosis not present

## 2017-03-18 DIAGNOSIS — Z7682 Awaiting organ transplant status: Secondary | ICD-10-CM | POA: Diagnosis not present

## 2017-03-18 DIAGNOSIS — E43 Unspecified severe protein-calorie malnutrition: Secondary | ICD-10-CM | POA: Diagnosis not present

## 2017-03-18 DIAGNOSIS — B259 Cytomegaloviral disease, unspecified: Secondary | ICD-10-CM | POA: Diagnosis not present

## 2017-03-20 ENCOUNTER — Telehealth: Payer: Self-pay

## 2017-03-20 NOTE — Telephone Encounter (Signed)
Erika Martinez called to say she would not be attending [name] today due to having an infection. Will check up on patient in a week.

## 2017-03-22 DIAGNOSIS — E43 Unspecified severe protein-calorie malnutrition: Secondary | ICD-10-CM | POA: Insufficient documentation

## 2017-03-24 DIAGNOSIS — R768 Other specified abnormal immunological findings in serum: Secondary | ICD-10-CM | POA: Diagnosis not present

## 2017-03-24 DIAGNOSIS — Z7682 Awaiting organ transplant status: Secondary | ICD-10-CM | POA: Diagnosis not present

## 2017-03-24 DIAGNOSIS — J9610 Chronic respiratory failure, unspecified whether with hypoxia or hypercapnia: Secondary | ICD-10-CM | POA: Diagnosis not present

## 2017-03-27 DIAGNOSIS — Z7682 Awaiting organ transplant status: Secondary | ICD-10-CM | POA: Diagnosis not present

## 2017-03-27 DIAGNOSIS — R768 Other specified abnormal immunological findings in serum: Secondary | ICD-10-CM | POA: Diagnosis not present

## 2017-03-27 DIAGNOSIS — J9610 Chronic respiratory failure, unspecified whether with hypoxia or hypercapnia: Secondary | ICD-10-CM | POA: Diagnosis not present

## 2017-03-30 DIAGNOSIS — Z942 Lung transplant status: Secondary | ICD-10-CM

## 2017-03-30 NOTE — Progress Notes (Signed)
Pulmonary Individual Treatment Plan  Patient Details   Name: Erika Martinez MRN: [id number] Date of Birth: [birth year] Referring Provider:     Pulmonary Rehab from 01/12/2017 in [facility] Cardiac and Pulmonary Rehab  Referring Provider  Steele Sizer MD      Initial Encounter Date:    Pulmonary Rehab from 01/12/2017 in [facility] Cardiac and Pulmonary Rehab  Date  01/12/17  Referring Provider  Steele Sizer MD      Visit Diagnosis: H/O lung transplant [facility])  Patient's Home Medications on Admission:  Current Outpatient Prescriptions:  .  acetaminophen (TYLENOL) 500 MG tablet, Take 1 tablet by mouth every 4 (four) hours as needed., Disp: , Rfl:  .  Alum & Mag Hydroxide-Simeth (GI COCKTAIL) SUSP suspension, Take 30 mLs by mouth 2 (two) times daily as needed for indigestion. Shake well., Disp: 120 mL, Rfl: 0 .  aspirin 81 MG chewable tablet, Chew 1 tablet by mouth daily., Disp: , Rfl:  .  azaTHIOprine (IMURAN) 50 MG tablet, Take 1 tablet (50 mg total) by mouth daily., Disp: 60 tablet, Rfl: 0 .  Calcium Citrate-Vitamin D (CALCIUM CITRATE+D3 PETITES) 200-250 MG-UNIT TABS, Take 3 tablets by mouth 2 (two) times daily., Disp: , Rfl:  .  cholecalciferol (VITAMIN D) 1000 UNITS tablet, Take 2,000 Units by mouth daily., Disp: , Rfl:  .  fluconazole (DIFLUCAN) 200 MG tablet, TAKE 2 TABLETS (400 MG TOTAL) BY MOUTH ONCE DAILY., Disp: , Rfl: 3 .  fluticasone (FLONASE) 50 MCG/ACT nasal spray, PLACE 2 SPRAYS INTO BOTH NOSTRILS DAILY., Disp: 16 g, Rfl: 6 .  Immune Globulin, Human, 2.5 GM/25ML SOLN, Inject into the vein., Disp: , Rfl:  .  loratadine (CLARITIN) 10 MG tablet, TAKE 1 TABLET (10 MG TOTAL) BY MOUTH 2 (TWO) TIMES DAILY., Disp: 60 tablet, Rfl: 3 .  magnesium oxide (MAG-OX) 400 MG tablet, Take 1 tablet by mouth daily., Disp: , Rfl:  .  metoprolol tartrate (LOPRESSOR) 25 MG tablet, Take 0.5 tablets by mouth 2 (two) times daily., Disp: , Rfl: 11 .  Multiple Vitamin (MULTI-VITAMINS) TABS, Take 1 tablet by  mouth daily., Disp: , Rfl:  .  OLANZapine (ZYPREXA) 5 MG tablet, Take 1 tablet by mouth daily., Disp: , Rfl: 0 .  omeprazole (PRILOSEC) 20 MG capsule, TAKE ONE CAPSULE BY MOUTH IN THE MORNING FOR GERD, Disp: 30 capsule, Rfl: 0 .  ondansetron (ZOFRAN) 4 MG tablet, Take 1 tablet (4 mg total) by mouth every 8 (eight) hours as needed for nausea or vomiting., Disp: 30 tablet, Rfl: 0 .  oxyCODONE (OXY IR/ROXICODONE) 5 MG immediate release tablet, Take 1 tablet by mouth as needed., Disp: , Rfl: 0 .  predniSONE (DELTASONE) 5 MG tablet, Take 4 tablets by mouth 4 (four) times daily., Disp: , Rfl:  .  Skin Protectants, Misc. (EUCERIN) cream, Apply topically., Disp: , Rfl:  .  sulfamethoxazole-trimethoprim (BACTRIM DS,SEPTRA DS) 800-160 MG tablet, TAKE 1 TABLET BY MOUTH 3 (THREE) TIMES A WEEK., Disp: , Rfl: 5 .  tacrolimus (PROGRAF) 1 MG capsule, Take 1 capsule by mouth every 12 (twelve) hours., Disp: , Rfl:  .  valGANciclovir (VALCYTE) 450 MG tablet, Take 1 tablet by mouth daily., Disp: , Rfl:  .  VENTOLIN HFA 108 (90 Base) MCG/ACT inhaler, INHALE 2 PUFFS INTO THE LUNGS EVERY 4 (FOUR) HOURS AS NEEDED FOR WHEEZING OR SHORTNESS OF BREATH., Disp: 18 g, Rfl: 5 .  vitamin B-12 (CYANOCOBALAMIN) 250 MCG tablet, Place 250 mcg under the tongue every other day. , Disp: ,  Rfl:   Past Medical History: Past Medical History:  Diagnosis Date  . Abnormal hemoglobin (HCC)   . Allergy   . Anxiety   . Asthma   . Controlled insomnia   . Erythrocytosis   . GERD (gastroesophageal reflux disease)   . Hep C w/o coma, chronic ([place])   . Hypertension   . IBS (irritable bowel syndrome)   . Lumbago   . Mitral valve prolapse   . Oxygen dependent   . Plantar wart   . Pulmonary fibrosis ([place])   . Scleroderma ([place])    Dr. Pennie Banter  . Seasonal allergies   . Spondylolisthesis   . Symptomatic menopausal or female climacteric states     Tobacco Use: History  Smoking Status  . Never Smoker  Smokeless Tobacco  . Never Used     Labs: Recent Review Flowsheet Data    Labs for ITP Cardiac and Pulmonary Rehab Latest Ref Rng & Units 07/14/2013 07/05/2015 08/02/2015   Cholestrol 100 - 199 mg/dL 169 - 196   LDLCALC 0 - 99 mg/dL 95 - 112(H)   HDL >39 mg/dL 58 - 68   Trlycerides 0 - 149 mg/dL 78 - 78   Hemoglobin A1c 4.8 - 5.6 % - 6.3(H) -       Pulmonary Assessment Scores:     Pulmonary Assessment Scores    Row Name 01/12/17 1521         ADL UCSD   ADL Phase Entry     SOB Score total 22     Rest 0     Walk 0     Stairs 2     Bath 1     Dress 0     Shop 1       CAT Score   CAT Score 14       mMRC Score   mMRC Score 2        Pulmonary Function Assessment:     Pulmonary Function Assessment - 01/12/17 1654      Breath   Bilateral Breath Sounds Clear;Decreased   Shortness of Breath No      Exercise Target Goals:    Exercise Program Goal: Individual exercise prescription set with THRR, safety & activity barriers. Participant demonstrates ability to understand and report RPE using BORG scale, to self-measure pulse accurately, and to acknowledge the importance of the exercise prescription.  Exercise Prescription Goal: Starting with aerobic activity 30 plus minutes a day, 3 days per week for initial exercise prescription. Provide home exercise prescription and guidelines that participant acknowledges understanding prior to discharge.  Activity Barriers & Risk Stratification:     Activity Barriers & Cardiac Risk Stratification - 01/12/17 1601      Activity Barriers & Cardiac Risk Stratification   Activity Barriers Deconditioning;Muscular Weakness;Shortness of Breath;Assistive Device;Balance Concerns  using rollator for support for walking      6 Minute Walk:     6 Minute Walk    Row Name 01/12/17 1558         6 Minute Walk   Phase Initial     Distance 602 feet  using rollator     Walk Time 5.8 minutes     # of Rest Breaks 2  4 sec, 8 sec     MPH 1.14     METS 2.42      RPE 13     Perceived Dyspnea  0     VO2 Peak 8.47     Symptoms Yes (comment)  Comments legs feel like jello     Resting HR 105 bpm     Resting BP 128/70     Max Ex. HR 121 bpm     Max Ex. BP 124/66     2 Minute Post BP 126/60       Interval HR   Baseline HR (retired) 105     1 Minute HR 117     2 Minute HR 118     3 Minute HR 121     4 Minute HR 119     5 Minute HR 119     6 Minute HR 118     2 Minute Post HR 110     Interval Heart Rate? Yes       Interval Oxygen   Interval Oxygen? Yes     Baseline Oxygen Saturation % 97 %     Resting Liters of Oxygen 0 L  Room Air     1 Minute Oxygen Saturation % 100 %     1 Minute Liters of Oxygen 0 L     2 Minute Oxygen Saturation % 100 %     2 Minute Liters of Oxygen 0 L     3 Minute Oxygen Saturation % 100 %     3 Minute Liters of Oxygen 0 L     4 Minute Oxygen Saturation % 100 %     4 Minute Liters of Oxygen 0 L     5 Minute Oxygen Saturation % 100 %     5 Minute Liters of Oxygen 0 L     6 Minute Oxygen Saturation % 100 %     6 Minute Liters of Oxygen 0 L     2 Minute Post Oxygen Saturation % 100 %     2 Minute Post Liters of Oxygen 0 L       Oxygen Initial Assessment:     Oxygen Initial Assessment - 01/12/17 1651      Home Oxygen   Home Oxygen Device None   Sleep Oxygen Prescription None   Home Exercise Oxygen Prescription None   Home at Rest Exercise Oxygen Prescription None     Intervention   Short Term Goals To learn and understand importance of monitoring SPO2 with pulse oximeter and demonstrate accurate use of the pulse oximeter.;To Learn and understand importance of maintaining oxygen saturations>88%;To learn and demonstrate proper purse lipped breathing techniques or other breathing techniques.;To learn and demonstrate proper use of respiratory medications   Long  Term Goals Verbalizes importance of monitoring SPO2 with pulse oximeter and return demonstration;Exhibits proper breathing techniques, such as  purse lipped breathing or other method taught during program session;Demonstrates proper use of MDI's;Compliance with respiratory medication;Maintenance of O2 saturations>88%      Oxygen Re-Evaluation:     Oxygen Re-Evaluation    Row Name 01/16/17 1251 02/18/17 1216 02/25/17 1450         Program Oxygen Prescription   Program Oxygen Prescription  - None None       Home Oxygen   Home Oxygen Device  - None None     Sleep Oxygen Prescription  - None None     Home Exercise Oxygen Prescription  - None None     Home at Rest Exercise Oxygen Prescription  - None None     Compliance with Home Oxygen Use  - Yes Yes       Goals/Expected Outcomes   Short Term Goals To learn and demonstrate proper  purse lipped breathing techniques or other breathing techniques. To learn and understand importance of monitoring SPO2 with pulse oximeter and demonstrate accurate use of the pulse oximeter.;To learn and understand importance of maintaining oxygen saturations>88%;To learn and demonstrate proper pursed lip breathing techniques or other breathing techniques.;To learn and demonstrate proper use of respiratory medications To learn and understand importance of maintaining oxygen saturations>88%;To learn and understand importance of monitoring SPO2 with pulse oximeter and demonstrate accurate use of the pulse oximeter.;To learn and demonstrate proper pursed lip breathing techniques or other breathing techniques.     Long  Term Goals Exhibits proper breathing techniques, such as purse lipped breathing or other method taught during program session Maintenance of O2 saturations>88%;Compliance with respiratory medication;Verbalizes importance of monitoring SPO2 with pulse oximeter and return demonstration;Exhibits proper breathing techniques, such as pursed lip breathing or other method taught during program session;Demonstrates proper use of MDI's Maintenance of O2 saturations>88%;Compliance with respiratory  medication;Demonstrates proper use of MDI's;Exhibits proper breathing techniques, such as pursed lip breathing or other method taught during program session;Verbalizes importance of monitoring SPO2 with pulse oximeter and return demonstration     Comments Reviewed PLB technique with pt.  Talked about how it work and it's important to maintaining his exercise saturations.  Maintaining oxygen saturations at home and monitors with pulse oximeter.  She does have some side effects from anti-rejection medications including prednisone.  She is doing well onother meds, occasionally she will still use her emergency inhaler during coughing spells and drainage from allergies.   Continues to use her pursed lip breathing and has noticed that is has become a habit for her.   Alisan has checks her oxygen at home everyday and makes sure her oxygen saturations are above 88 percent. She does PLB well but could use more teaching. She has not been using oxygen at home. She knows how to use her albuterol inhaler but states she has not needed it.      Goals/Expected Outcomes Short: Become more profiecient at using PLB.   Long: Become independent at using PLB. Short: Continue monitor oxygen saturations at home Long: Continue to manage care post transplant and adjust to anit-rejection meds. Short: work on PLB techniques. Long: Be proficient at PLB        Oxygen Discharge (Final Oxygen Re-Evaluation):     Oxygen Re-Evaluation - 02/25/17 1450      Program Oxygen Prescription   Program Oxygen Prescription None     Home Oxygen   Home Oxygen Device None   Sleep Oxygen Prescription None   Home Exercise Oxygen Prescription None   Home at Rest Exercise Oxygen Prescription None   Compliance with Home Oxygen Use Yes     Goals/Expected Outcomes   Short Term Goals To learn and understand importance of maintaining oxygen saturations>88%;To learn and understand importance of monitoring SPO2 with pulse oximeter and demonstrate  accurate use of the pulse oximeter.;To learn and demonstrate proper pursed lip breathing techniques or other breathing techniques.   Long  Term Goals Maintenance of O2 saturations>88%;Compliance with respiratory medication;Demonstrates proper use of MDI's;Exhibits proper breathing techniques, such as pursed lip breathing or other method taught during program session;Verbalizes importance of monitoring SPO2 with pulse oximeter and return demonstration   Comments Zarya has checks her oxygen at home everyday and makes sure her oxygen saturations are above 88 percent. She does PLB well but could use more teaching. She has not been using oxygen at home. She knows how to use her albuterol inhaler but  states she has not needed it.    Goals/Expected Outcomes Short: work on PLB techniques. Long: Be proficient at PLB      Initial Exercise Prescription:     Initial Exercise Prescription - 01/12/17 1600      Date of Initial Exercise RX and Referring Provider   Date 01/12/17   Referring Provider Steele Sizer MD     NuStep   Level 1   SPM 80   Minutes 15   METs 2     REL-XR   Level 1   Speed 50   Minutes 15   METs 2     Track   Laps 21   Minutes 15   METs 2     Prescription Details   Frequency (times per week) 3   Duration Progress to 45 minutes of aerobic exercise without signs/symptoms of physical distress     Intensity   THRR 40-80% of Max Heartrate 126-147   Ratings of Perceived Exertion 11-13   Perceived Dyspnea 0-4     Progression   Progression Continue to progress workloads to maintain intensity without signs/symptoms of physical distress.     Resistance Training   Training Prescription Yes   Weight 3 lbs   Reps 10-15      Perform Capillary Blood Glucose checks as needed.  Exercise Prescription Changes:     Exercise Prescription Changes    Row Name 01/12/17 1600 01/20/17 1600 02/04/17 1600 02/17/17 1400 02/18/17 1200     Response to Exercise   Blood Pressure  (Admit) 128/70 122/78 110/78 119/64  -   Blood Pressure (Exercise) 124/66  -  -  -  -   Blood Pressure (Exit) 126/60 110/74 112/62 110/64  -   Heart Rate (Admit) 105 bpm 96 bpm 68 bpm 99 bpm  -   Heart Rate (Exercise) 121 bpm 101 bpm 78 bpm 75 bpm  -   Heart Rate (Exit) 110 bpm 83 bpm 86 bpm 75 bpm  -   Oxygen Saturation (Admit) 97 % 96 % 96 % 99 %  -   Oxygen Saturation (Exercise) 100 % 95 % 97 % 96 %  -   Oxygen Saturation (Exit) 100 % 99 % 99 % 100 %  -   Rating of Perceived Exertion (Exercise) _0 -   Perceived Dyspnea (Exercise) 0 0 1 2  -   Symptoms legs felt like jello fatigue fatigue  -  -   Comments walk test results first full day of exercise second day of exercise third day of exercise  -   Duration  - Progress to 45 minutes of aerobic exercise without signs/symptoms of physical distress Progress to 45 minutes of aerobic exercise without signs/symptoms of physical distress Continue with 45 min of aerobic exercise without signs/symptoms of physical distress.  -   Intensity  - THRR unchanged THRR unchanged THRR unchanged  -     Progression   Progression  - Continue to progress workloads to maintain intensity without signs/symptoms of physical distress. Continue to progress workloads to maintain intensity without signs/symptoms of physical distress. Continue to progress workloads to maintain intensity without signs/symptoms of physical distress.  -   Average METs  - 2.7 2.85 1.46  -     Resistance Training   Training Prescription  - Yes Yes Yes  -   Weight  - 3 lbs 3 lbs 3 lbs  -   Reps  - 10-15 10-15 10-15  -  Interval Training   Interval Training  - No No No  -     NuStep   Level  - 1  - 1  -   SPM  - 41  - 52  -   Minutes  - 15  - 15  -   METs  - 1.6  - 1.2  -     REL-XR   Level  - _0 -   Speed  - _1 -   Minutes  - _2 -   METs  - 3.8 4.2 1.5  -     Track   Laps  -  - 11 15  -   Minutes  -  - 15 15  -   METs  -  - 1.5 1.7  -      Home Exercise Plan   Plans to continue exercise at  -  -  -  - Home (comment)  walking, recumbent bike, complex gym   Frequency  -  -  -  - Add 1 additional day to program exercise sessions.   Initial Home Exercises Provided  -  -  -  - 02/18/17   Row Name 03/03/17 1500 03/17/17 1600           Response to Exercise   Blood Pressure (Admit) 130/70 92/54      Blood Pressure (Exit) 124/74 102/72      Heart Rate (Admit) 74 bpm 72 bpm      Heart Rate (Exercise) 96 bpm 78 bpm      Heart Rate (Exit) 70 bpm 118 bpm      Oxygen Saturation (Admit) 99 % 94 %      Oxygen Saturation (Exercise) 94 % 97 %      Oxygen Saturation (Exit) 99 % 94 %      Rating of Perceived Exertion (Exercise) 12 12      Perceived Dyspnea (Exercise) 1 1      Symptoms fatigue fatigue      Duration Continue with 45 min of aerobic exercise without signs/symptoms of physical distress. Continue with 45 min of aerobic exercise without signs/symptoms of physical distress.      Intensity THRR unchanged THRR unchanged        Progression   Progression Continue to progress workloads to maintain intensity without signs/symptoms of physical distress. Continue to progress workloads to maintain intensity without signs/symptoms of physical distress.      Average METs 1.25 1.55        Resistance Training   Training Prescription Yes Yes      Weight 3 lbs 3 lbs      Reps 10-15 10-15        Interval Training   Interval Training No No        NuStep   Level 1 2      SPM 77 63      Minutes 15 15      METs 1.4 1.4        REL-XR   Level 1 1      Speed 26 12      Minutes 15 15      METs 1.1 1        Track   Laps -  has not been walking 15      Minutes  - 15      METs  - 1.7        Home  Exercise Plan   Plans to continue exercise at Home (comment)  walking, recumbent bike, complex gym Home (comment)  walking, recumbent bike, complex gym      Frequency Add 1 additional day to program exercise sessions. Add 1 additional day  to program exercise sessions.      Initial Home Exercises Provided 02/18/17 02/18/17         Exercise Comments:     Exercise Comments    Row Name 01/16/17 1150           Exercise Comments First full day of exercise!  Patient was oriented to gym and equipment including functions, settings, policies, and procedures.  Patient's individual exercise prescription and treatment plan were reviewed.  All starting workloads were established based on the results of the 6 minute walk test done at initial orientation visit.  The plan for exercise progression was also introduced and progression will be customized based on patient's performance and goals.          Exercise Goals and Review:     Exercise Goals    Row Name 01/12/17 1606             Exercise Goals   Increase Physical Activity Yes       Intervention Provide advice, education, support and counseling about physical activity/exercise needs.;Develop an individualized exercise prescription for aerobic and resistive training based on initial evaluation findings, risk stratification, comorbidities and participant's personal goals.       Expected Outcomes Achievement of increased cardiorespiratory fitness and enhanced flexibility, muscular endurance and strength shown through measurements of functional capacity and personal statement of participant.       Increase Strength and Stamina Yes       Intervention Provide advice, education, support and counseling about physical activity/exercise needs.;Develop an individualized exercise prescription for aerobic and resistive training based on initial evaluation findings, risk stratification, comorbidities and participant's personal goals.       Expected Outcomes Achievement of increased cardiorespiratory fitness and enhanced flexibility, muscular endurance and strength shown through measurements of functional capacity and personal statement of participant.          Exercise Goals Re-Evaluation  :     Exercise Goals Re-Evaluation    Row Name 01/20/17 1655 02/04/17 1554 02/17/17 1418 02/18/17 1223 03/03/17 1515     Exercise Goal Re-Evaluation   Exercise Goals Review Increase Physical Activity;Increase Strenth and Stamina Increase Physical Activity;Increase Strength and Stamina Increase Physical Activity;Increase Strength and Stamina Increase Physical Activity;Increase Strength and Stamina;Understanding of Exercise Prescription;Knowledge and understanding of Target Heart Rate Range (THRR);Able to check pulse independently Increase Physical Activity;Increase Strength and Stamina   Comments Diane has completed her first full day of exercise.  She has also had some transportation issues and was not able to make it in for her next visit.  We will continue to work on her progression.  Diane has only attended one visit since last update. She continues to have transportation issues.  We will continue to work on her progression. Diane returned yesterday after being out for almost 3 weeks.  She has not been feeling well but could tell she was just getting weaker sitting at home.  She was able to come back without any problems.  We will continue to monitor her progression.  Strength and stamina are starting to come back and are her biggest limitation for doing things around the house.  She is able to do more at home and paces herself.  Her other goal  was to get rid of her rollator.  She is able to get around the house some without but she is afraid of falling.  Diane knows that she really needs to focus on buidling up her strength back up to help with this. Reviewed home exercise with pt today.  Pt plans to walk at [place] for exercise.  She is going to build up to being able to walk entire [place].  For the rest of the 30 min she is going to use her recumbent bike and possibly go to complex gym.  Reviewed THR, pulse, RPE, sign and symptoms, and when to call 911 or MD.  Also discussed weather  considerations and indoor options.  Pt voiced understanding. Diane has been doing well in rehab.  She needs lots of encouragement to walk.  She is up to 1.4 METs on the NuStep.  We will continue to monitor her progress.    Expected Outcomes Short: Attend pulmonary rehab classes regularly.  Long: Work on Animator.  Short: Attend pulmonary rehab classes regularly.  Long: Work on Animator.  Short: Attend rehab regularly. Long: Build up strength and stamina.  Short: Start walking [place] when not here (adding in at least one of home exercise) Long: Building up strength to be able walk without rollator. Short: Encourage her to walk more here and at home.  Long: Continue to work on building up strength and stamina.    [place] Name 03/17/17 1614             Exercise Goal Re-Evaluation   Exercise Goals Review Increase Physical Activity;Increase Strength and Stamina       Comments Diane continues to do well in rehab.  She is back to walking more again.  She has moved up to level 2 on the NuStep.  We will continue to monitor her progression.        Expected Outcomes Short: Continue to encourage her walking. Long: Continue to build her strength and stamina.           Discharge Exercise Prescription (Final Exercise Prescription Changes):     Exercise Prescription Changes - 03/17/17 1600      Response to Exercise   Blood Pressure (Admit) 92/54   Blood Pressure (Exit) 102/72   Heart Rate (Admit) 72 bpm   Heart Rate (Exercise) 78 bpm   Heart Rate (Exit) 118 bpm   Oxygen Saturation (Admit) 94 %   Oxygen Saturation (Exercise) 97 %   Oxygen Saturation (Exit) 94 %   Rating of Perceived Exertion (Exercise) 12   Perceived Dyspnea (Exercise) 1   Symptoms fatigue   Duration Continue with 45 min of aerobic exercise without signs/symptoms of physical distress.   Intensity THRR unchanged     Progression   Progression Continue to progress workloads to maintain  intensity without signs/symptoms of physical distress.   Average METs 1.55     Resistance Training   Training Prescription Yes   Weight 3 lbs   Reps 10-15     Interval Training   Interval Training No     NuStep   Level 2   SPM 63   Minutes 15   METs 1.4     REL-XR   Level 1   Speed 12   Minutes 15   METs 1     Track   Laps 15   Minutes 15   METs 1.7     Home Exercise Plan  Plans to continue exercise at Home (comment)  walking, recumbent bike, complex gym   Frequency Add 1 additional day to program exercise sessions.   Initial Home Exercises Provided 02/18/17      Nutrition:  Target Goals: Understanding of nutrition guidelines, daily intake of sodium <1570m, cholesterol <2054m calories 30% from fat and 7% or less from saturated fats, daily to have 5 or more servings of fruits and vegetables.  Biometrics:     Pre Biometrics - 01/12/17 1607      Pre Biometrics   Height _0  (1.549 m)   Weight 109 lb 1.6 oz (49.5 kg)   Waist Circumference 28 inches   Hip Circumference 31 inches   Waist to Hip Ratio 0.9 %   BMI (Calculated) 20.7       Nutrition Therapy Plan and Nutrition Goals:     Nutrition Therapy & Goals - 01/12/17 1526      Intervention Plan   Intervention Prescribe, educate and counsel regarding individualized specific dietary modifications aiming towards targeted core components such as weight, hypertension, lipid management, diabetes, heart failure and other comorbidities.;Nutrition handout(s) given to patient.   Expected Outcomes Short Term Goal: Understand basic principles of dietary content, such as calories, fat, sodium, cholesterol and nutrients.;Short Term Goal: A plan has been developed with personal nutrition goals set during dietitian appointment.;Long Term Goal: Adherence to prescribed nutrition plan.      Nutrition Discharge: Rate Your Plate Scores:     Nutrition Assessments - 01/12/17 1629      MEDFICTS Scores   Pre Score 27       Nutrition Goals Re-Evaluation:     Nutrition Goals Re-Evaluation    Row Name 02/18/17 1210             Goals   Nutrition Goal Gain weight       Comment Diane would like to gain weight.  Her appt is schedule for 10/8 before class.  She has been working on improving her diet and appetite.       Expected Outcome Short: Work on eating more.  Long: Meet with dietician          Nutrition Goals Discharge (Final Nutrition Goals Re-Evaluation):     Nutrition Goals Re-Evaluation - 02/18/17 1210      Goals   Nutrition Goal Gain weight   Comment Diane would like to gain weight.  Her appt is schedule for 10/8 before class.  She has been working on improving her diet and appetite.   Expected Outcome Short: Work on eating more.  Long: Meet with dietician      Psychosocial: Target Goals: Acknowledge presence or absence of significant depression and/or stress, maximize coping skills, provide positive support system. Participant is able to verbalize types and ability to use techniques and skills needed for reducing stress and depression.   Initial Review & Psychosocial Screening:     Initial Psych Review & Screening - 01/12/17 1526      Initial Review   Current issues with Current Abuse or Neglect to ReOneontaYes      Quality of Life Scores:   PHQ-9: Recent Review Flowsheet Data    [facility] 01/12/2017 12/02/2016 06/27/2016 03/26/2016 12/14/2015   Decreased Interest 0 0 0 0 1   Down, Depressed, Hopeless 1 0 0 0 1   PHQ - 2 Score 1 0 0 0 2   Altered sleeping 0 - - -  1   Tired, decreased energy 2 - - - 2   Change in appetite 1 - - - 2   Feeling bad or failure about yourself  0 - - - 1   Trouble concentrating 0 - - - 0   Moving slowly or fidgety/restless 0 - - - 0   Suicidal thoughts 0 - - - 0   PHQ-9 Score 4 - - - 8   Difficult doing work/chores Not difficult at all - - - Somewhat difficult     Interpretation of  Total Score  Total Score Depression Severity:  1-4 = Minimal depression, 5-9 = Mild depression, 10-14 = Moderate depression, 15-19 = Moderately severe depression, 20-27 = Severe depression   Psychosocial Evaluation and Intervention:     Psychosocial Evaluation - 02/16/17 1211      Psychosocial Evaluation & Interventions   Comments Counselor met with Ms. Marasigan today Hilda Blades) who has returned to this program after the lung transplant on 3/19.  She has a strong support system living with her daughter and she has a sister close by.  Aurilla is also actively involved in her local church.  She is sleeping better now but her appetite since the surgery has remained a problem with medication issues causing stomach problems and acid reflux.  She denies a history of depression or anxiety but was aware the Dr. put her on a medication for anxiety post-surgery.  She recently d/c that medication as it is no longer needed.  Her mood is more depressed lately with lack of energy - unable to go to work yet and feels like she has "no life."  Margaurite is "forcing herself to come to this class even though her stomach hurts all the time and she is tired most of the time.  She has goals to increase her strength and get strong enough to eliminate use of the walker.  She also would like to put some weight back on as she feels like she is "withering away."  Counselor recommended a book for Hanadi to encourage her and give her hope in this situation.  Counselor will be following with her throughout the course of this program.     Expected Outcomes Charnice will benefit from consistent exercise to achieve her stated goals.  The educational and psychoeducational components of this program will be helpful in managing and coping through her recovery.  Kenetra will also benefit from meeting with the dietician to address her weight gain goals.     Continue Psychosocial Services  Follow up required by counselor      Psychosocial  Re-Evaluation:     Psychosocial Re-Evaluation    [place]ona Name 02/25/17 1234             Psychosocial Re-Evaluation   Comments Counselor follow up with Emberleigh reporting she got the book yesterday and has been reading and listening to the inspirational recommended playlist and this has been helpful for her during this stressful time.  Counselor commended her for this aspect of her self-care.         Expected Outcomes Lavren will continue to practice positive self care strategies to improve her mood and decrease her stress levels.            Psychosocial Discharge (Final Psychosocial Re-Evaluation):     Psychosocial Re-Evaluation - 02/25/17 1234      Psychosocial Re-Evaluation   Comments Counselor follow up with Kysa reporting she got the book yesterday and has been reading and listening  to the inspirational recommended playlist and this has been helpful for her during this stressful time.  Counselor commended her for this aspect of her self-care.     Expected Outcomes Panzy will continue to practice positive self care strategies to improve her mood and decrease her stress levels.        Education: Education Goals: Education classes will be provided on a weekly basis, covering required topics. Participant will state understanding/return demonstration of topics presented.  Learning Barriers/Preferences:     Learning Barriers/Preferences - 01/12/17 1653      Learning Barriers/Preferences   Learning Barriers Sight  wears reading glasses   Learning Preferences Group Instruction;Individual Instruction;Pictoral;Skilled Demonstration;Verbal Instruction;Video;Written Material      Education Topics: Initial Evaluation Education: - Verbal, written and demonstration of respiratory meds, RPE/PD scales, oximetry and breathing techniques. Instruction on use of nebulizers and MDIs: cleaning and proper use, rinsing mouth with steroid doses and importance of monitoring MDI activations.    Pulmonary Rehab from 03/11/2017 in [facility] Cardiac and Pulmonary Rehab  Date  01/12/17  Educator  [facility]  Instruction Review Code (retired)  2- meets goals/outcomes      General Nutrition Guidelines/Fats and Fiber: -Group instruction provided by verbal, written material, models and posters to present the general guidelines for heart healthy nutrition. Gives an explanation and review of dietary fats and fiber.   Pulmonary Rehab from 03/11/2017 in [facility] Cardiac and Pulmonary Rehab  Date  01/12/17  Educator  [facility]      Controlling Sodium/Reading Food Labels: -Group verbal and written material supporting the discussion of sodium use in heart healthy nutrition. Review and explanation with models, verbal and written materials for utilization of the food label.   Pulmonary Rehab from 02/01/2016 in [facility] Cardiac and Pulmonary Rehab  Date  01/07/16  Educator  CR  Instruction Review Code (retired)  2- meets goals/outcomes      Exercise Physiology & Risk Factors: - Group verbal and written instruction with models to review the exercise physiology of the cardiovascular system and associated critical values. Details cardiovascular disease risk factors and the goals associated with each risk factor.   Pulmonary Rehab from 02/01/2016 in [facility] Cardiac and Pulmonary Rehab  Date  12/26/15  Educator  [facility]   Instruction Review Code (retired)  2- meets goals/outcomes      Aerobic Exercise & Resistance Training: - Gives group verbal and written discussion on the health impact of inactivity. On the components of aerobic and resistive training programs and the benefits of this training and how to safely progress through these programs.   Flexibility, Balance, General Exercise Guidelines: - Provides group verbal and written instruction on the benefits of flexibility and balance training programs. Provides general exercise guidelines with specific guidelines to those with heart or lung  disease. Demonstration and skill practice provided.   Pulmonary Rehab from 03/11/2017 in [facility] Cardiac and Pulmonary Rehab  Date  01/21/17  Educator  AS      Stress Management: - Provides group verbal and written instruction about the health risks of elevated stress, cause of high stress, and healthy ways to reduce stress.   Pulmonary Rehab from 03/11/2017 in [facility] Cardiac and Pulmonary Rehab  Date  02/25/17  Educator  [facility]  Instruction Review Code  1- Verbalizes Understanding      Depression: - Provides group verbal and written instruction on the correlation between heart/lung disease and depressed mood, treatment options, and the stigmas associated with seeking treatment.  Exercise & Equipment Safety: - Individual verbal instruction and demonstration of equipment use and safety with use of the equipment.   Pulmonary Rehab from 02/01/2016 in [facility] Cardiac and Pulmonary Rehab  Date  12/14/15  Educator  [facility] [place]  Instruction Review Code (retired)  1- partially meets, needs review/practice      Infection Prevention: - Provides verbal and written material to individual with discussion of infection control including proper hand washing and proper equipment cleaning during exercise session.   Pulmonary Rehab from 03/11/2017 in [facility] Cardiac and Pulmonary Rehab  Date  01/12/17  Educator  [facility]      Falls Prevention: - Provides verbal and written material to individual with discussion of falls prevention and safety.   Pulmonary Rehab from 03/11/2017 in [facility] Cardiac and Pulmonary Rehab  Date  01/12/17  Educator  [facility]  Instruction Review Code (retired)  2- meets goals/outcomes      Diabetes: - Individual verbal and written instruction to review signs/symptoms of diabetes, desired ranges of glucose level fasting, after meals and with exercise. Advice that pre and post exercise glucose checks will be done for 3 sessions at entry of program.   Chronic Lung Diseases: - Group verbal and  written instruction to review new updates, new respiratory medications, new advancements in procedures and treatments. Provide informative websites and "800" numbers of self-education.   Pulmonary Rehab from 03/11/2017 in [facility] Cardiac and Pulmonary Rehab  Date  03/11/17  Educator  [facility]  Instruction Review Code  1- Verbalizes Understanding      Lung Procedures: - Group verbal and written instruction to describe testing methods done to diagnose lung disease. Review the outcome of test results. Describe the treatment choices: Pulmonary Function Tests, ABGs and oximetry.   Documentation from 03/14/2015 in [place]  Date  01/12/15  Educator  sj  Instruction Review Code (retired)  2- meets Chief Financial Officer: - Provide group verbal and written instruction for methods to conserve energy, plan and organize activities. Instruct on pacing techniques, use of adaptive equipment and posture/positioning to relieve shortness of breath.   Pulmonary Rehab from 03/11/2017 in [facility] Cardiac and Pulmonary Rehab  Date  02/18/17  Educator  [facility]  Instruction Review Code  1- Verbalizes Understanding      Triggers: - Group verbal and written instruction to review types of environmental controls: home humidity, furnaces, filters, dust mite/pet prevention, HEPA vacuums. To discuss weather changes, air quality and the benefits of nasal washing.   Documentation from 03/14/2015 in [place]  Date  12/18/14  Educator  LB  Instruction Review Code (retired)  2- meets goals/outcomes      Exacerbations: - Group verbal and written instruction to provide: warning signs, infection symptoms, calling MD promptly, preventive modes, and value of vaccinations. Review: effective airway clearance, coughing and/or vibration techniques. Create an Sports administrator.   Pulmonary Rehab from 02/01/2016 in [facility] Cardiac and Pulmonary Rehab  Date   01/21/16  Educator  LB  Instruction Review Code (retired)  2- meets goals/outcomes      Oxygen: - Individual and group verbal and written instruction on oxygen therapy. Includes supplement oxygen, available portable oxygen systems, continuous and intermittent flow rates, oxygen safety, concentrators, and Medicare reimbursement for oxygen.   Documentation from 03/14/2015 in [place]  Date  12/05/14  Educator  LB  Instruction Review Code (retired)  2- meets goals/outcomes  Respiratory Medications: - Group verbal and written instruction to review medications for lung disease. Drug class, frequency, complications, importance of spacers, rinsing mouth after steroid MDI's, and proper cleaning methods for nebulizers.   Documentation from 03/14/2015 in [place]  Date  12/22/14  Educator  [facility]  Instruction Review Code (retired)  2- meets Theatre manager and instructions given to pt. ]      AED/CPR: - Group verbal and written instruction with the use of models to demonstrate the basic use of the AED with the basic ABC's of resuscitation.   Pulmonary Rehab from 03/11/2017 in [facility] Cardiac and Pulmonary Rehab  Date  02/27/17  Educator  CE  Instruction Review Code  1- Verbalizes Understanding      Breathing Retraining: - Provides individuals verbal and written instruction on purpose, frequency, and proper technique of diaphragmatic breathing and pursed-lipped breathing. Applies individual practice skills.   Pulmonary Rehab from 03/11/2017 in [facility] Cardiac and Pulmonary Rehab  Date  01/12/17  Educator  [facility]      Anatomy and Physiology of the Lungs: - Group verbal and written instruction with the use of models to provide basic lung anatomy and physiology related to function, structure and complications of lung disease.   Pulmonary Rehab from 02/01/2016 in [facility] Cardiac and Pulmonary Rehab  Date  02/01/16   Educator  [place]  Instruction Review Code (retired)  2- meets Designer, fashion/clothing & Physiology of the Heart: - Group verbal and written instruction and models provide basic cardiac anatomy and physiology, with the coronary electrical and arterial systems. Review of: AMI, Angina, Valve disease, Heart Failure, Cardiac Arrhythmia, Pacemakers, and the ICD.   Documentation from 03/14/2015 in [place]  Date  12/08/14  Educator  CE  Instruction Review Code (retired)  2- meets goals/outcomes      Heart Failure: - Group verbal and written instruction on the basics of heart failure: signs/symptoms, treatments, explanation of ejection fraction, enlarged heart and cardiomyopathy.   Pulmonary Rehab from 02/01/2016 in [facility] Cardiac and Pulmonary Rehab  Date  01/25/16  Educator  CE  Instruction Review Code (retired)  2- meets goals/outcomes      Sleep Apnea: - Individual verbal and written instruction to review Obstructive Sleep Apnea. Review of risk factors, methods for diagnosing and types of masks and machines for OSA.   Anxiety: - Provides group, verbal and written instruction on the correlation between heart/lung disease and anxiety, treatment options, and management of anxiety.   Pulmonary Rehab from 03/11/2017 in [facility] Cardiac and Pulmonary Rehab  Date  02/25/17  Educator  [facility]  Instruction Review Code  1- Verbalizes Understanding      Relaxation: - Provides group, verbal and written instruction about the benefits of relaxation for patients with heart/lung disease. Also provides patients with examples of relaxation techniques.   Pulmonary Rehab from 02/01/2016 in [facility] Cardiac and Pulmonary Rehab  Date  01/16/16  Educator  Kathreen Cornfield, [facility]  Instruction Review Code (retired)  2- Meets goals/outcomes      Cardiac Medications: - Group verbal and written instruction to review commonly prescribed medications for heart disease. Reviews the  medication, class of the drug, and side effects.   Documentation from 03/14/2015 in [place]  Date  12/22/14  Educator  CE  Instruction Review Code (retired)  2- meets goals/outcomes      Know Your Numbers: -Group verbal and written instruction about  important numbers in your health.  Review of Cholesterol, Blood Pressure, Diabetes, and BMI and the role they play in your overall health.   Other: -Provides group and verbal instruction on various topics (see comments)    Knowledge Questionnaire Score:     Knowledge Questionnaire Score - 01/12/17 1520      Knowledge Questionnaire Score   Pre Score 9/10       Core Components/Risk Factors/Patient Goals at Admission:     Personal Goals and Risk Factors at Admission - 01/12/17 1527      Core Components/Risk Factors/Patient Goals on Admission    Weight Management Yes;Weight Gain   Intervention Weight Management: Develop a combined nutrition and exercise program designed to reach desired caloric intake, while maintaining appropriate intake of nutrient and fiber, sodium and fats, and appropriate energy expenditure required for the weight goal.;Weight Management: Provide education and appropriate resources to help participant work on and attain dietary goals.;Weight Management/Obesity: Establish reasonable short term and long term weight goals.   Admit Weight 111 lb (50.3 kg)   Goal Weight: Short Term 116 lb (52.6 kg)   Goal Weight: Long Term 120 lb (54.4 kg)   Expected Outcomes Short Term: Continue to assess and modify interventions until short term weight is achieved;Long Term: Adherence to nutrition and physical activity/exercise program aimed toward attainment of established weight goal;Weight Maintenance: Understanding of the daily nutrition guidelines, which includes 25-35% calories from fat, 7% or less cal from saturated fats, less than 259m cholesterol, less than 1.5gm of sodium, & 5 or more  servings of fruits and vegetables daily;Understanding recommendations for meals to include 15-35% energy as protein, 25-35% energy from fat, 35-60% energy from carbohydrates, less than 2025mof dietary cholesterol, 20-35 gm of total fiber daily;Understanding of distribution of calorie intake throughout the day with the consumption of 4-5 meals/snacks;Weight Gain: Understanding of general recommendations for a high calorie, high protein meal plan that promotes weight gain by distributing calorie intake throughout the day with the consumption for 4-5 meals, snacks, and/or supplements   Improve shortness of breath with ADL's Yes   Intervention Provide education, individualized exercise plan and daily activity instruction to help decrease symptoms of SOB with activities of daily living.   Expected Outcomes Short Term: Achieves a reduction of symptoms when performing activities of daily living.   Develop more efficient breathing techniques such as purse lipped breathing and diaphragmatic breathing; and practicing self-pacing with activity Yes   Intervention Provide education, demonstration and support about specific breathing techniuqes utilized for more efficient breathing. Include techniques such as pursed lipped breathing, diaphragmatic breathing and self-pacing activity.   Expected Outcomes Short Term: Participant will be able to demonstrate and use breathing techniques as needed throughout daily activities.   Increase knowledge of respiratory medications and ability to use respiratory devices properly  Yes   Intervention Provide education and demonstration as needed of appropriate use of medications, inhalers, and oxygen therapy.   Expected Outcomes Short Term: Achieves understanding of medications use. Understands that oxygen is a medication prescribed by physician. Demonstrates appropriate use of inhaler and oxygen therapy.   Personal Goal Wants to be able to build her pulmonary support group    Intervention Meet more than once a month with her pulmonary support group   Expected Outcomes Short: Meet with pulmonary support group. Long: Make the pulmonary support group expand.      Core Components/Risk Factors/Patient Goals Review:      Goals and Risk Factor Review  [place] Name 02/18/17 1204             Core Components/Risk Factors/Patient Goals Review   Personal Goals Review Weight Management/Obesity;Improve shortness of breath with ADL's;Other       Review Weight is finally starting to come up.  She hasn't been able to eat but appetite is starting to come back which will help.  She is able to do most of the things around the house, but her breathing is not the limiting factor; it's her stamina!! Still wants to get support group going but Diane and other group members have been sick as well.       Expected Outcomes Short: Work on weight gain.  Long: Work on getting support group back together.           Core Components/Risk Factors/Patient Goals at Discharge (Final Review):      Goals and Risk Factor Review - 02/18/17 1204      Core Components/Risk Factors/Patient Goals Review   Personal Goals Review Weight Management/Obesity;Improve shortness of breath with ADL's;Other   Review Weight is finally starting to come up.  She hasn't been able to eat but appetite is starting to come back which will help.  She is able to do most of the things around the house, but her breathing is not the limiting factor; it's her stamina!! Still wants to get support group going but Diane and other group members have been sick as well.   Expected Outcomes Short: Work on weight gain.  Long: Work on getting support group back together.       ITP Comments:     ITP Comments    Row Name 01/12/17 1649 02/02/17 1206 03/02/17 0834 03/30/17 0828 03/30/17 0829   ITP Comments Medical Evaluation completed. Chart sent to Dr. Emily Filbert director of [place] for review and changes. Diagnosis can be found  in [facility] diagnosis. 30 day note chart sent to Dr. Sabra Heck Director of [place] for review and changes. 30 day review completed. ITP sent to Dr. Emily Filbert Director of [place]. Continue with ITP unless changes are made by physician.   Unable to obtain goals. Patient has been out sick due to an infection. 30 day review completed. ITP sent to Dr. Emily Filbert Director of [place]. Continue with ITP unless changes are made by physician.        Comments: 30 day review

## 2017-04-01 DIAGNOSIS — Z7682 Awaiting organ transplant status: Secondary | ICD-10-CM | POA: Diagnosis not present

## 2017-04-01 DIAGNOSIS — R768 Other specified abnormal immunological findings in serum: Secondary | ICD-10-CM | POA: Diagnosis not present

## 2017-04-01 DIAGNOSIS — J9610 Chronic respiratory failure, unspecified whether with hypoxia or hypercapnia: Secondary | ICD-10-CM | POA: Diagnosis not present

## 2017-04-02 ENCOUNTER — Telehealth: Payer: Self-pay

## 2017-04-02 DIAGNOSIS — Z942 Lung transplant status: Secondary | ICD-10-CM

## 2017-04-02 NOTE — Telephone Encounter (Signed)
Erika Martinez states she is doing much better and her test results came back negative. The doctors want her to finish the antibiotics before she returns to [place], which should be next week or after.

## 2017-04-07 DIAGNOSIS — R768 Other specified abnormal immunological findings in serum: Secondary | ICD-10-CM | POA: Diagnosis not present

## 2017-04-07 DIAGNOSIS — J9610 Chronic respiratory failure, unspecified whether with hypoxia or hypercapnia: Secondary | ICD-10-CM | POA: Diagnosis not present

## 2017-04-07 DIAGNOSIS — Z7682 Awaiting organ transplant status: Secondary | ICD-10-CM | POA: Diagnosis not present

## 2017-04-08 DIAGNOSIS — R768 Other specified abnormal immunological findings in serum: Secondary | ICD-10-CM | POA: Diagnosis not present

## 2017-04-08 DIAGNOSIS — J9610 Chronic respiratory failure, unspecified whether with hypoxia or hypercapnia: Secondary | ICD-10-CM | POA: Diagnosis not present

## 2017-04-08 DIAGNOSIS — Z7682 Awaiting organ transplant status: Secondary | ICD-10-CM | POA: Diagnosis not present

## 2017-04-10 ENCOUNTER — Encounter: Payer: 59 | Attending: Family Medicine

## 2017-04-10 DIAGNOSIS — Z942 Lung transplant status: Secondary | ICD-10-CM | POA: Insufficient documentation

## 2017-04-14 ENCOUNTER — Encounter: Payer: Self-pay | Admitting: *Deleted

## 2017-04-14 ENCOUNTER — Telehealth: Payer: Self-pay | Admitting: *Deleted

## 2017-04-14 DIAGNOSIS — Z79899 Other long term (current) drug therapy: Secondary | ICD-10-CM | POA: Diagnosis not present

## 2017-04-14 DIAGNOSIS — Z452 Encounter for adjustment and management of vascular access device: Secondary | ICD-10-CM | POA: Diagnosis not present

## 2017-04-14 DIAGNOSIS — Z4824 Encounter for aftercare following lung transplant: Secondary | ICD-10-CM | POA: Diagnosis not present

## 2017-04-14 DIAGNOSIS — K219 Gastro-esophageal reflux disease without esophagitis: Secondary | ICD-10-CM | POA: Diagnosis not present

## 2017-04-14 DIAGNOSIS — B259 Cytomegaloviral disease, unspecified: Secondary | ICD-10-CM | POA: Diagnosis not present

## 2017-04-14 DIAGNOSIS — Z942 Lung transplant status: Secondary | ICD-10-CM | POA: Diagnosis not present

## 2017-04-14 DIAGNOSIS — T86818 Other complications of lung transplant: Secondary | ICD-10-CM | POA: Diagnosis not present

## 2017-04-14 NOTE — Telephone Encounter (Signed)
Called to check on status of return.  Left message.  

## 2017-04-15 DIAGNOSIS — Z7682 Awaiting organ transplant status: Secondary | ICD-10-CM | POA: Diagnosis not present

## 2017-04-15 DIAGNOSIS — J9610 Chronic respiratory failure, unspecified whether with hypoxia or hypercapnia: Secondary | ICD-10-CM | POA: Diagnosis not present

## 2017-04-15 DIAGNOSIS — R768 Other specified abnormal immunological findings in serum: Secondary | ICD-10-CM | POA: Diagnosis not present

## 2017-04-16 DIAGNOSIS — Z7682 Awaiting organ transplant status: Secondary | ICD-10-CM | POA: Diagnosis not present

## 2017-04-16 DIAGNOSIS — J9610 Chronic respiratory failure, unspecified whether with hypoxia or hypercapnia: Secondary | ICD-10-CM | POA: Diagnosis not present

## 2017-04-16 DIAGNOSIS — R768 Other specified abnormal immunological findings in serum: Secondary | ICD-10-CM | POA: Diagnosis not present

## 2017-04-20 ENCOUNTER — Other Ambulatory Visit: Payer: Self-pay

## 2017-04-20 ENCOUNTER — Emergency Department: Payer: 59

## 2017-04-20 ENCOUNTER — Encounter: Payer: Self-pay | Admitting: Emergency Medicine

## 2017-04-20 ENCOUNTER — Telehealth: Payer: Self-pay

## 2017-04-20 ENCOUNTER — Emergency Department
Admission: EM | Admit: 2017-04-20 | Discharge: 2017-04-21 | Disposition: A | Discharge status: Other Acute Inpatient Hospital | Payer: 59 | Attending: Emergency Medicine | Admitting: Emergency Medicine

## 2017-04-20 DIAGNOSIS — Z79899 Other long term (current) drug therapy: Secondary | ICD-10-CM | POA: Insufficient documentation

## 2017-04-20 DIAGNOSIS — D899 Disorder involving the immune mechanism, unspecified: Secondary | ICD-10-CM | POA: Insufficient documentation

## 2017-04-20 DIAGNOSIS — Z942 Lung transplant status: Secondary | ICD-10-CM | POA: Diagnosis not present

## 2017-04-20 DIAGNOSIS — Z9489 Other transplanted organ and tissue status: Secondary | ICD-10-CM

## 2017-04-20 DIAGNOSIS — R509 Fever, unspecified: Secondary | ICD-10-CM | POA: Diagnosis present

## 2017-04-20 DIAGNOSIS — D849 Immunodeficiency, unspecified: Secondary | ICD-10-CM

## 2017-04-20 DIAGNOSIS — B370 Candidal stomatitis: Secondary | ICD-10-CM | POA: Insufficient documentation

## 2017-04-20 DIAGNOSIS — Z7982 Long term (current) use of aspirin: Secondary | ICD-10-CM | POA: Diagnosis not present

## 2017-04-20 DIAGNOSIS — I10 Essential (primary) hypertension: Secondary | ICD-10-CM | POA: Diagnosis not present

## 2017-04-20 DIAGNOSIS — J45909 Unspecified asthma, uncomplicated: Secondary | ICD-10-CM | POA: Insufficient documentation

## 2017-04-20 DIAGNOSIS — R358 Other polyuria: Secondary | ICD-10-CM | POA: Diagnosis not present

## 2017-04-20 HISTORY — DX: Raynaud's syndrome without gangrene: I73.00

## 2017-04-20 LAB — CBC WITH DIFFERENTIAL/PLATELET
BAND NEUTROPHILS: 0 %
BASOS ABS: 0 10*3/uL (ref 0–0.1)
BLASTS: 0 %
Basophils Relative: 0 %
EOS ABS: 0 10*3/uL (ref 0–0.7)
Eosinophils Relative: 0 %
HEMATOCRIT: 35.3 % (ref 35.0–47.0)
HEMOGLOBIN: 11.7 g/dL — AB (ref 12.0–16.0)
Lymphocytes Relative: 12 %
Lymphs Abs: 0.9 10*3/uL — ABNORMAL LOW (ref 1.0–3.6)
MCH: 30.7 pg (ref 26.0–34.0)
MCHC: 33.1 g/dL (ref 32.0–36.0)
MCV: 92.5 fL (ref 80.0–100.0)
METAMYELOCYTES PCT: 1 %
MONOS PCT: 17 %
Monocytes Absolute: 1.2 10*3/uL — ABNORMAL HIGH (ref 0.2–0.9)
Myelocytes: 1 %
NEUTROS ABS: 5 10*3/uL (ref 1.4–6.5)
Neutrophils Relative %: 69 %
Other: 0 %
PROMYELOCYTES ABS: 0 %
Platelets: 229 10*3/uL (ref 150–440)
RBC: 3.82 MIL/uL (ref 3.80–5.20)
RDW: 17.7 % — ABNORMAL HIGH (ref 11.5–14.5)
WBC: 7.1 10*3/uL (ref 3.6–11.0)
nRBC: 0 /100 WBC

## 2017-04-20 LAB — URINALYSIS, COMPLETE (UACMP) WITH MICROSCOPIC
Bacteria, UA: NONE SEEN
Bilirubin Urine: NEGATIVE
GLUCOSE, UA: NEGATIVE mg/dL
HGB URINE DIPSTICK: NEGATIVE
KETONES UR: NEGATIVE mg/dL
LEUKOCYTES UA: NEGATIVE
NITRITE: NEGATIVE
PH: 6 (ref 5.0–8.0)
PROTEIN: NEGATIVE mg/dL
Specific Gravity, Urine: 1.01 (ref 1.005–1.030)

## 2017-04-20 LAB — COMPREHENSIVE METABOLIC PANEL
ALT: 18 U/L (ref 14–54)
AST: 25 U/L (ref 15–41)
Albumin: 3.6 g/dL (ref 3.5–5.0)
Alkaline Phosphatase: 51 U/L (ref 38–126)
Anion gap: 12 (ref 5–15)
BILIRUBIN TOTAL: 0.8 mg/dL (ref 0.3–1.2)
BUN: 26 mg/dL — AB (ref 6–20)
CHLORIDE: 100 mmol/L — AB (ref 101–111)
CO2: 21 mmol/L — ABNORMAL LOW (ref 22–32)
CREATININE: 1.24 mg/dL — AB (ref 0.44–1.00)
Calcium: 8.8 mg/dL — ABNORMAL LOW (ref 8.9–10.3)
GFR, EST AFRICAN AMERICAN: 52 mL/min — AB (ref >60)
GFR, EST NON AFRICAN AMERICAN: 45 mL/min — AB (ref >60)
Glucose, Bld: 136 mg/dL — ABNORMAL HIGH (ref 65–99)
POTASSIUM: 4.1 mmol/L (ref 3.5–5.1)
Sodium: 133 mmol/L — ABNORMAL LOW (ref 135–145)
TOTAL PROTEIN: 6.7 g/dL (ref 6.5–8.1)

## 2017-04-20 LAB — INFLUENZA PANEL BY PCR (TYPE A & B)
Influenza A By PCR: NEGATIVE
Influenza B By PCR: NEGATIVE

## 2017-04-20 LAB — PROTIME-INR
INR: 1.02
Prothrombin Time: 13.3 seconds (ref 11.4–15.2)

## 2017-04-20 LAB — LACTIC ACID, PLASMA: LACTIC ACID, VENOUS: 1.5 mmol/L (ref 0.5–1.9)

## 2017-04-20 MED ORDER — ACETAMINOPHEN 500 MG PO TABS
1000.0000 mg | ORAL_TABLET | Freq: Once | ORAL | Status: AC
  Administered 2017-04-20: 1000 mg via ORAL
  Filled 2017-04-20: qty 2

## 2017-04-20 MED ORDER — PIPERACILLIN-TAZOBACTAM 3.375 G IVPB 30 MIN
3.3750 g | Freq: Once | INTRAVENOUS | Status: AC
  Administered 2017-04-20: 3.375 g via INTRAVENOUS
  Filled 2017-04-20: qty 50

## 2017-04-20 MED ORDER — ACETAMINOPHEN 325 MG PO TABS: 650.0000 mg | ORAL_TABLET | Freq: Once | ORAL | Status: DC | PRN

## 2017-04-20 MED ORDER — NYSTATIN 100000 UNIT/ML MT SUSP
5.0000 mL | Freq: Once | OROMUCOSAL | Status: AC
  Administered 2017-04-20: 500000 [IU] via ORAL
  Filled 2017-04-20: qty 5

## 2017-04-20 MED ORDER — VANCOMYCIN HCL IN DEXTROSE 1-5 GM/200ML-% IV SOLN
1000.0000 mg | Freq: Once | INTRAVENOUS | Status: AC
  Administered 2017-04-20: 1000 mg via INTRAVENOUS
  Filled 2017-04-20: qty 200

## 2017-04-20 MED ORDER — IBUPROFEN 400 MG PO TABS
400.0000 mg | ORAL_TABLET | Freq: Once | ORAL | Status: AC
  Administered 2017-04-20: 400 mg via ORAL
  Filled 2017-04-20: qty 1

## 2017-04-20 MED ORDER — SODIUM CHLORIDE 0.9 % IV BOLUS (SEPSIS)
1000.0000 mL | Freq: Once | INTRAVENOUS | Status: AC
  Administered 2017-04-20: 1000 mL via INTRAVENOUS

## 2017-04-20 NOTE — ED Notes (Signed)
CODE  SEPSIS  CALLED  TO  [facility] 

## 2017-04-20 NOTE — ED Triage Notes (Signed)
Pt here for fever X 2 days. No other symptoms other than mild dysuria. 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

## 2017-04-20 NOTE — ED Notes (Signed)
PT  GOING  TO  [facility] [facility]

## 2017-04-20 NOTE — ED Notes (Signed)
FIRST NURSE NOTE: Pt brought via wheelchair from [facility] for evaluation of fever and fatigue X 2 days. Pt bilateral lung transplant recipient, takes immunosuppressers.

## 2017-04-20 NOTE — ED Notes (Signed)
Reception contacted Erika Martinez regarding transfer. Currently no assigned bed.

## 2017-04-20 NOTE — ED Notes (Signed)
Report given to Margreta Journey, Therapist, sports at [facility].

## 2017-04-20 NOTE — Telephone Encounter (Signed)
Visited Erika Martinez in the ED today. She states that she has a fever and is being transferred to [facility]. She wanted to come back to rehab but has been in and out of the hospital recently.

## 2017-04-20 NOTE — ED Provider Notes (Signed)
[facility] Emergency Department Provider Note  ____________________________________________  Time seen: Approximately 4:53 PM  I have reviewed the triage vital signs and the nursing notes.   HISTORY  Chief Complaint Fever and Polyuria   HPI Erika Martinez is a 61 y.o. 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 Patient reports a fever since yesterday. She has had sore throat and congestion. Also had 2 episodes of diarrhea today. Has had nausea but that is an ongoing problem for the patient. No vomiting. No dysuria or hematuria but endorses frequency. Patient reports abdominal pain with intermittent and cramping which she has had for several months due to her CMV. No pain at this time. No cough, chest pain, shortness of breath.  Past Medical History:  Diagnosis Date  . Abnormal hemoglobin (HCC)   . Allergy   . Anxiety   . Asthma   . Controlled insomnia   . Erythrocytosis   . GERD (gastroesophageal reflux disease)   . Hep C w/o coma, chronic ([place])   . Hypertension   . IBS (irritable bowel syndrome)   . Lumbago   . Mitral valve prolapse   . Oxygen dependent   . Plantar wart   . Pulmonary fibrosis ([place])   . Raynaud disease   . Scleroderma ([place])    Dr. Pennie Banter  . Seasonal allergies   . Spondylolisthesis   . Symptomatic menopausal or female climacteric states     Patient Active Problem List   Diagnosis Date Noted  . Immunosuppressed status ([place]) 12/02/2016  . S/P lung transplant ([place]) 08/26/2016  . Raynaud disease 11/26/2015  . Decreased GFR 07/24/2015  . Mild major depression ([place]) 06/07/2015  . Hypertension, benign 04/06/2015  . History of hepatitis C 02/02/2015  . Insomnia 02/02/2015  . Gastro-esophageal reflux disease without esophagitis  02/02/2015  . IBS (irritable bowel syndrome) 02/02/2015  . Low serum cobalamin 02/02/2015  . Perennial allergic rhinitis with seasonal variation 02/02/2015  . Scleroderma ([place]) 02/02/2015  . SPL (spondylolisthesis) 02/02/2015  . Supraventricular tachycardia ([place]) 02/02/2015  . Vitamin D deficiency 02/02/2015  . Anxiety 02/02/2015  . History of pneumococcal pneumonia 09/13/2013  . Polycythemia, secondary 09/13/2013    Past Surgical History:  Procedure Laterality Date  . ABDOMINAL HYSTERECTOMY    . LUNG TRANSPLANT, DOUBLE    . VAGINAL HYSTERECTOMY      Prior to Admission medications   Medication Sig Start Date End Date Taking? Authorizing Provider  acetaminophen (TYLENOL) 500 MG tablet Take 1 tablet by mouth every 4 (four) hours as needed. 09/08/16   [provider]  Alum & Mag Hydroxide-Simeth (GI COCKTAIL) SUSP suspension Take 30 mLs by mouth 2 (two) times daily as needed for indigestion. Shake well. 01/23/17   Steele Sizer, MD  aspirin 81 MG chewable tablet Chew 1 tablet by mouth daily. 11/06/16   [provider]  azaTHIOprine (IMURAN) 50 MG tablet Take 1 tablet (50 mg total) by mouth daily. 12/02/16   Steele Sizer, MD  Calcium Citrate-Vitamin D (CALCIUM CITRATE+D3 PETITES) 200-250 MG-UNIT TABS Take 3 tablets by mouth 2 (two) times daily. 11/06/16   [provider]  cholecalciferol (VITAMIN D) 1000 UNITS tablet Take 2,000 Units by mouth daily.    [provider]  fluconazole (DIFLUCAN) 200 MG tablet TAKE 2 TABLETS (400 MG TOTAL) BY MOUTH  ONCE DAILY. 12/24/16   [provider]  fluticasone (FLONASE) 50 MCG/ACT nasal spray PLACE 2 SPRAYS INTO BOTH NOSTRILS DAILY. 01/09/17   Steele Sizer, MD  Immune Globulin, Human, 2.5 GM/25ML SOLN Inject into the vein.    [provider]  loratadine (CLARITIN) 10 MG tablet TAKE 1 TABLET (10 MG TOTAL) BY MOUTH 2 (TWO) TIMES DAILY. 08/29/16   Steele Sizer, MD  magnesium oxide (MAG-OX) 400 MG tablet  Take 1 tablet by mouth daily.    [provider]  metoprolol tartrate (LOPRESSOR) 25 MG tablet Take 0.5 tablets by mouth 2 (two) times daily. 11/13/16   [provider]  Multiple Vitamin (MULTI-VITAMINS) TABS Take 1 tablet by mouth daily. 09/08/16   [provider]  OLANZapine (ZYPREXA) 5 MG tablet Take 1 tablet by mouth daily. 11/26/16   [provider]  omeprazole (PRILOSEC) 20 MG capsule TAKE ONE CAPSULE BY MOUTH IN THE MORNING FOR GERD 02/12/17   Ancil Boozer, Drue Stager, MD  ondansetron (ZOFRAN) 4 MG tablet Take 1 tablet (4 mg total) by mouth every 8 (eight) hours as needed for nausea or vomiting. 02/18/17   Steele Sizer, MD  oxyCODONE (OXY IR/ROXICODONE) 5 MG immediate release tablet Take 1 tablet by mouth as needed. 11/29/16   Mikey College, MD  predniSONE (DELTASONE) 5 MG tablet Take 4 tablets by mouth 4 (four) times daily. 09/08/16   Mikey College, MD  Skin Protectants, Misc. (EUCERIN) cream Apply topically. 11/06/16 11/06/17  [provider]  sulfamethoxazole-trimethoprim (BACTRIM DS,SEPTRA DS) 800-160 MG tablet TAKE 1 TABLET BY MOUTH 3 (THREE) TIMES A WEEK. 09/07/15   [provider]  tacrolimus (PROGRAF) 1 MG capsule Take 1 capsule by mouth every 12 (twelve) hours. 11/06/16 11/06/17  Mikey College, MD  valGANciclovir (VALCYTE) 450 MG tablet Take 1 tablet by mouth daily. 11/06/16   Mikey College, MD  VENTOLIN HFA 108 (90 Base) MCG/ACT inhaler INHALE 2 PUFFS INTO THE LUNGS EVERY 4 (FOUR) HOURS AS NEEDED FOR WHEEZING OR SHORTNESS OF BREATH. 07/29/16   Juanito Doom, MD  vitamin B-12 (CYANOCOBALAMIN) 250 MCG tablet Place 250 mcg under the tongue every other day.     [provider]    Allergies Patient has no known allergies.  Family History  Problem Relation Age of Onset  . Lung cancer Father   . Cancer Father   . Alzheimer's disease Mother   . Breast cancer Neg Hx     Social History Social History   Tobacco Use  .  Smoking status: Never Smoker  . Smokeless tobacco: Never Used  Substance Use Topics  . Alcohol use: Yes    Alcohol/week: 2.0 oz    Types: 4 Standard drinks or equivalent per week    Comment: "social drinker"  . Drug use: No    Review of Systems  Constitutional: +fever. Eyes: Negative for visual changes. ENT: + sore throat, congestion Neck: No neck pain  Cardiovascular: Negative for chest pain. Respiratory: Negative for shortness of breath. Gastrointestinal: Negative for abdominal pain, vomiting. Nausea and diarrhea. Genitourinary: Negative for dysuria. + frequency Musculoskeletal: Negative for back pain. Skin: Negative for rash. Neurological: Negative for headaches, weakness or numbness. Psych: No SI or HI  ____________________________________________   PHYSICAL EXAM:  VITAL SIGNS: ED Triage Vitals [04/20/17 1614]  Enc Vitals Group     BP 113/76     Pulse Rate 89     Resp 20     Temp (!) 102.4 F (39.1 C)  Temp Source Oral     SpO2 97 %     Weight 108 lb (49 kg)     Height 5\' 2"  (1.575 m)     Head Circumference      Peak Flow      Pain Score      Pain Loc      Pain Edu?      Excl. in [place]?     Constitutional: Alert and oriented. Well appearing and in no apparent distress. HEENT:      Head: Normocephalic and atraumatic.         Eyes: Conjunctivae are normal. Sclera is non-icteric.       Mouth/Throat: Mucous membranes are moist. Thrush. Oropharynx is clear with no exudate.      Neck: Supple with no signs of meningismus. Cardiovascular: Regular rate and rhythm. No murmurs, gallops, or rubs. 2+ symmetrical distal pulses are present in all extremities. No JVD. Respiratory: Normal respiratory effort. Lungs are clear to auscultation bilaterally. No wheezes, crackles, or rhonchi.  Gastrointestinal: Soft, non tender, and non distended with positive bowel sounds. No rebound or guarding. Genitourinary: No CVA tenderness. Musculoskeletal: Nontender with normal range  of motion in all extremities. No edema, cyanosis, or erythema of extremities. Neurologic: Normal speech and language. Face is symmetric. Moving all extremities. No gross focal neurologic deficits are appreciated. Skin: Skin is warm, dry and intact. No rash noted. Picc site is clean with no evidence of infection Psychiatric: Mood and affect are normal. Speech and behavior are normal.  ____________________________________________   LABS (all labs ordered are listed, but only abnormal results are displayed)  Labs Reviewed  COMPREHENSIVE METABOLIC PANEL - Abnormal; Notable for the following components:      Result Value   Sodium 133 (*)    Chloride 100 (*)    CO2 21 (*)    Glucose, Bld 136 (*)    BUN 26 (*)    Creatinine, Ser 1.24 (*)    Calcium 8.8 (*)    GFR calc non Af Amer 45 (*)    GFR calc Af Amer 52 (*)    All other components within normal limits  CBC WITH DIFFERENTIAL/PLATELET - Abnormal; Notable for the following components:   Hemoglobin 11.7 (*)    RDW 17.7 (*)    Lymphs Abs 0.9 (*)    Monocytes Absolute 1.2 (*)    All other components within normal limits  CULTURE, BLOOD (ROUTINE X 2)  CULTURE, BLOOD (ROUTINE X 2)  URINE CULTURE  C DIFFICILE QUICK SCREEN W PCR REFLEX  LACTIC ACID, PLASMA  PROTIME-INR  LACTIC ACID, PLASMA  URINALYSIS, COMPLETE (UACMP) WITH MICROSCOPIC  INFLUENZA PANEL BY PCR (TYPE A & B)   ____________________________________________  EKG  none  ____________________________________________  RADIOLOGY  CXR: 1. Possible trace right pleural effusion. 2. Diffuse peribronchial cuffing and mild diffuse interstitial prominence. These findings are of uncertain etiology and chronicity, as no postoperative chest x-ray is available for comparison. Given the acute presentation, the possibility of a bronchitis should be considered. 3. Postoperative changes and support apparatus, as above.   ____________________________________________   PROCEDURES  Procedure(s) performed: None Procedures Critical Care performed: yes  CRITICAL CARE Performed by: Rudene Re  ?  Total critical care time: 40 min  Critical care time was exclusive of separately billable procedures and treating other patients.  Critical care was necessary to treat or prevent imminent or life-threatening deterioration.  Critical care was time spent personally by me on the following activities: development  of treatment plan with patient and/or surrogate as well as nursing, discussions with consultants, evaluation of patient's response to treatment, examination of patient, obtaining history from patient or surrogate, ordering and performing treatments and interventions, ordering and review of laboratory studies, ordering and review of radiographic studies, pulse oximetry and re-evaluation of patient's condition.  ____________________________________________   INITIAL IMPRESSION / ASSESSMENT AND PLAN / ED COURSE   61 y.o. 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 Patient is immunosuppressed on Imuran and prograf, also on valganciclovir IV and Diflucan PO. Patient is well-appearing, has a fever of 102.4. Normal heart rate and blood pressure however since patient is immune suppressed she was made a code sepsis. At this time the only finding on exam is thrush. Labs are pending, chest x-ray, flu swab, C. difficile. We'll give IV fluids, Tylenol, Zosyn and vancomycin. Anticipate transfer to [facility] transplant team.    _________________________ 6:47 PM on 04/20/2017 -----------------------------------------  Spoke with Dr. Martinique Whitson, from pulmonary transplant team at [facility] who has accepted patient to  their service. As of now patient's labs show a normal white count, normal ANC, normal lactic, normal kidney function. Chest x-ray is negative for any infiltrate. Patient remains well appearing. urine and flu are pending.   _________________________ 8:53 PM on 04/20/2017 -----------------------------------------  Patient again with fever of 102.5, will give 2nd bolus and motrin. UA negative for UTI, flu negative. Waiting transport to [place].   As part of my medical decision making, I reviewed the following data within the [place] notes reviewed and incorporated, Labs reviewed , Old chart reviewed, Radiograph reviewed , A consult was requested and obtained from this/these consultant(s) [facility] Transplant Team, Notes from prior ED visits and [place] Controlled Substance Database    Pertinent labs & imaging results that were available during my care of the patient were reviewed by me and considered in my medical decision making (see chart for details).    ____________________________________________   FINAL CLINICAL IMPRESSION(S) / ED DIAGNOSES  Final diagnoses:  Fever, unspecified fever cause  Transplant recipient  Immunosuppressed status ([place])      NEW MEDICATIONS STARTED DURING THIS VISIT:  This SmartLink is deprecated. Use AVSMEDLIST instead to display the medication list for a patient.   Note:  This document was prepared using Dragon voice recognition software and may include unintentional dictation errors.    Rudene Re, MD 04/23/17 [phone]

## 2017-04-20 NOTE — ED Notes (Signed)
Pt currently states she can not pass urine or stool to collect samples at this time

## 2017-04-21 DIAGNOSIS — Z7682 Awaiting organ transplant status: Secondary | ICD-10-CM | POA: Diagnosis not present

## 2017-04-21 DIAGNOSIS — R768 Other specified abnormal immunological findings in serum: Secondary | ICD-10-CM | POA: Diagnosis not present

## 2017-04-21 DIAGNOSIS — N3000 Acute cystitis without hematuria: Secondary | ICD-10-CM | POA: Diagnosis not present

## 2017-04-21 DIAGNOSIS — I959 Hypotension, unspecified: Secondary | ICD-10-CM | POA: Diagnosis not present

## 2017-04-21 DIAGNOSIS — R509 Fever, unspecified: Secondary | ICD-10-CM | POA: Diagnosis not present

## 2017-04-21 DIAGNOSIS — R300 Dysuria: Secondary | ICD-10-CM | POA: Diagnosis not present

## 2017-04-21 DIAGNOSIS — Z942 Lung transplant status: Secondary | ICD-10-CM | POA: Diagnosis not present

## 2017-04-21 DIAGNOSIS — Z8619 Personal history of other infectious and parasitic diseases: Secondary | ICD-10-CM | POA: Insufficient documentation

## 2017-04-21 DIAGNOSIS — J9610 Chronic respiratory failure, unspecified whether with hypoxia or hypercapnia: Secondary | ICD-10-CM | POA: Diagnosis not present

## 2017-04-21 DIAGNOSIS — R918 Other nonspecific abnormal finding of lung field: Secondary | ICD-10-CM | POA: Diagnosis not present

## 2017-04-21 DIAGNOSIS — E872 Acidosis: Secondary | ICD-10-CM | POA: Diagnosis not present

## 2017-04-21 NOTE — ED Notes (Signed)
Patient transferred to [facility] via [facility] ambulance. Patient going to room 7809. Verified room is clean.

## 2017-04-21 NOTE — ED Notes (Signed)
Unable to get signature pad program to work. EMTALA signed on paper.

## 2017-04-22 DIAGNOSIS — Z7682 Awaiting organ transplant status: Secondary | ICD-10-CM | POA: Diagnosis not present

## 2017-04-22 DIAGNOSIS — R768 Other specified abnormal immunological findings in serum: Secondary | ICD-10-CM | POA: Diagnosis not present

## 2017-04-22 DIAGNOSIS — J9610 Chronic respiratory failure, unspecified whether with hypoxia or hypercapnia: Secondary | ICD-10-CM | POA: Diagnosis not present

## 2017-04-22 LAB — BLOOD CULTURE ID PANEL (REFLEXED)
Acinetobacter baumannii: NOT DETECTED
CANDIDA GLABRATA: NOT DETECTED
CANDIDA KRUSEI: NOT DETECTED
CANDIDA PARAPSILOSIS: NOT DETECTED
CANDIDA TROPICALIS: NOT DETECTED
Candida albicans: NOT DETECTED
ESCHERICHIA COLI: NOT DETECTED
Enterobacter cloacae complex: NOT DETECTED
Enterobacteriaceae species: NOT DETECTED
Enterococcus species: NOT DETECTED
Haemophilus influenzae: NOT DETECTED
KLEBSIELLA OXYTOCA: NOT DETECTED
KLEBSIELLA PNEUMONIAE: NOT DETECTED
Listeria monocytogenes: NOT DETECTED
Neisseria meningitidis: NOT DETECTED
PROTEUS SPECIES: NOT DETECTED
PSEUDOMONAS AERUGINOSA: NOT DETECTED
SERRATIA MARCESCENS: NOT DETECTED
STAPHYLOCOCCUS AUREUS BCID: NOT DETECTED
STAPHYLOCOCCUS SPECIES: NOT DETECTED
Streptococcus agalactiae: NOT DETECTED
Streptococcus pneumoniae: NOT DETECTED
Streptococcus pyogenes: NOT DETECTED
Streptococcus species: NOT DETECTED

## 2017-04-22 LAB — URINE CULTURE

## 2017-04-24 MED ORDER — CHOLECALCIFEROL 50 MCG (2000 UT) PO TABS
2000.00 | ORAL_TABLET | ORAL | Status: DC
Start: 2017-04-25 — End: 2017-04-24

## 2017-04-24 MED ORDER — RANITIDINE HCL 150 MG PO TABS
150.00 | ORAL_TABLET | ORAL | Status: DC
Start: 2017-04-24 — End: 2017-04-24

## 2017-04-24 MED ORDER — PANTOPRAZOLE SODIUM 40 MG PO TBEC
40.00 | DELAYED_RELEASE_TABLET | ORAL | Status: DC
Start: 2017-04-25 — End: 2017-04-24

## 2017-04-24 MED ORDER — ASPIRIN 81 MG PO CHEW
81.00 | CHEWABLE_TABLET | ORAL | Status: DC
Start: 2017-04-25 — End: 2017-04-24

## 2017-04-24 MED ORDER — ALBUTEROL SULFATE (5 MG/ML) 0.5% IN NEBU
2.50 | INHALATION_SOLUTION | RESPIRATORY_TRACT | Status: DC
Start: ? — End: 2017-04-24

## 2017-04-24 MED ORDER — ENOXAPARIN SODIUM 30 MG/0.3ML ~~LOC~~ SOLN
30.00 | SUBCUTANEOUS | Status: DC
Start: 2017-04-24 — End: 2017-04-24

## 2017-04-24 MED ORDER — ACETAMINOPHEN 325 MG PO TABS
650.00 | ORAL_TABLET | ORAL | Status: DC
Start: ? — End: 2017-04-24

## 2017-04-24 MED ORDER — ONDANSETRON HCL 4 MG PO TABS
4.00 | ORAL_TABLET | ORAL | Status: DC
Start: ? — End: 2017-04-24

## 2017-04-24 MED ORDER — GENERIC EXTERNAL MEDICATION
Status: DC
Start: ? — End: 2017-04-24

## 2017-04-24 MED ORDER — CALCIUM CARBONATE ANTACID 750 MG PO CHEW
CHEWABLE_TABLET | ORAL | Status: DC
Start: 2017-04-24 — End: 2017-04-24

## 2017-04-24 MED ORDER — LIDOCAINE HCL (PF) 1 % IJ SOLN
.50 | INTRAMUSCULAR | Status: DC
Start: ? — End: 2017-04-24

## 2017-04-24 MED ORDER — PREDNISONE 10 MG PO TABS
10.00 | ORAL_TABLET | ORAL | Status: DC
Start: 2017-04-25 — End: 2017-04-24

## 2017-04-24 MED ORDER — GENERIC EXTERNAL MEDICATION
125.00 | Status: DC
Start: 2017-04-25 — End: 2017-04-24

## 2017-04-24 MED ORDER — GENERIC EXTERNAL MEDICATION
125.00 | Status: DC
Start: 2017-04-24 — End: 2017-04-24

## 2017-04-24 MED ORDER — CIPROFLOXACIN HCL 500 MG PO TABS
500.00 | ORAL_TABLET | ORAL | Status: DC
Start: 2017-04-25 — End: 2017-04-24

## 2017-04-24 MED ORDER — SULFAMETHOXAZOLE-TRIMETHOPRIM 400-80 MG PO TABS
1.00 | ORAL_TABLET | ORAL | Status: DC
Start: 2017-04-27 — End: 2017-04-24

## 2017-04-25 LAB — CULTURE, BLOOD (ROUTINE X 2)
Culture: NO GROWTH
Special Requests: ADEQUATE

## 2017-04-26 NOTE — Progress Notes (Signed)
ED Antimicrobial Stewardship Positive Culture Follow Up   Erika Martinez is an 61 y.o. female who presented to [facility] on 04/20/2017 with a chief complaint of  Chief Complaint  Patient presents with  . Fever  . Polyuria    Recent Results (from the past 720 hour(s))  Culture, blood (Routine x 2)     Status: None   Collection Time: 04/20/17  5:00 PM  Result Value Ref Range Status   Specimen Description BLOOD RIGHT ARM  Final   Special Requests   Final    BOTTLES DRAWN AEROBIC AND ANAEROBIC Blood Culture results may not be optimal due to an excessive volume of blood received in culture bottles   Culture NO GROWTH 5 DAYS  Final   Report Status 04/25/2017 FINAL  Final  Culture, blood (Routine x 2)     Status: Abnormal   Collection Time: 04/20/17  5:18 PM  Result Value Ref Range Status   Specimen Description BLOOD LEFT HAND  Final   Special Requests   Final    BOTTLES DRAWN AEROBIC AND ANAEROBIC Blood Culture adequate volume   Culture  Setup Time   Final    GRAM POSITIVE COCCI AEROBIC BOTTLE ONLY CRITICAL RESULT CALLED TO, READ BACK BY AND VERIFIED WITH: [place] AT 1905 04/22/2017 BY TFK    Culture (A)  Final    STAPHYLOCOCCUS SPECIES (COAGULASE NEGATIVE) THE SIGNIFICANCE OF ISOLATING THIS ORGANISM FROM A SINGLE SET OF BLOOD CULTURES WHEN MULTIPLE SETS ARE DRAWN IS UNCERTAIN. PLEASE NOTIFY THE MICROBIOLOGY DEPARTMENT WITHIN ONE WEEK IF SPECIATION AND SENSITIVITIES ARE REQUIRED. Performed at [facility], [place] [address] [place], [place] [address]    Report Status 04/25/2017 FINAL  Final  Blood Culture ID Panel (Reflexed)     Status: None   Collection Time: 04/20/17  5:18 PM  Result Value Ref Range Status   Enterococcus species NOT DETECTED NOT DETECTED Final   Listeria monocytogenes NOT DETECTED NOT DETECTED Final   Staphylococcus species NOT DETECTED NOT DETECTED Final   Staphylococcus aureus NOT DETECTED NOT DETECTED Final   Streptococcus species NOT DETECTED NOT  DETECTED Final   Streptococcus agalactiae NOT DETECTED NOT DETECTED Final   Streptococcus pneumoniae NOT DETECTED NOT DETECTED Final   Streptococcus pyogenes NOT DETECTED NOT DETECTED Final   Acinetobacter baumannii NOT DETECTED NOT DETECTED Final   Enterobacteriaceae species NOT DETECTED NOT DETECTED Final   Enterobacter cloacae complex NOT DETECTED NOT DETECTED Final   Escherichia coli NOT DETECTED NOT DETECTED Final   Klebsiella oxytoca NOT DETECTED NOT DETECTED Final   Klebsiella pneumoniae NOT DETECTED NOT DETECTED Final   Proteus species NOT DETECTED NOT DETECTED Final   Serratia marcescens NOT DETECTED NOT DETECTED Final   Haemophilus influenzae NOT DETECTED NOT DETECTED Final   Neisseria meningitidis NOT DETECTED NOT DETECTED Final   Pseudomonas aeruginosa NOT DETECTED NOT DETECTED Final   Candida albicans NOT DETECTED NOT DETECTED Final   Candida glabrata NOT DETECTED NOT DETECTED Final   Candida krusei NOT DETECTED NOT DETECTED Final   Candida parapsilosis NOT DETECTED NOT DETECTED Final   Candida tropicalis NOT DETECTED NOT DETECTED Final  Urine culture     Status: Abnormal   Collection Time: 04/20/17  8:37 PM  Result Value Ref Range Status   Specimen Description URINE, RANDOM  Final   Special Requests NONE  Final   Culture MULTIPLE SPECIES PRESENT, SUGGEST RECOLLECTION (A)  Final   Report Status 04/22/2017 FINAL  Final    []   Treated with , organism resistant to prescribed antimicrobial [x]  Patient discharged originally without antimicrobial agent and treatment is not indicated  New antibiotic prescription: Patient transferred to [facility]  ED Provider: Spoke to RN taking care of patient at [facility] who will communicate results to team. RN confirmed being able to view results in Care Everywhere. Results likely represent contamination.    Napoleon Form 04/26/2017, 2:48 PM Infectious Diseases Pharmacist Phone# [phone]

## 2017-04-27 DIAGNOSIS — Z942 Lung transplant status: Secondary | ICD-10-CM

## 2017-04-27 NOTE — Progress Notes (Signed)
Pulmonary Individual Treatment Plan  Patient Details  Name: [name] MRN: [id number] Date of Birth: May 03, 1956 Referring Provider:     Pulmonary Rehab from 01/12/2017 in [facility] Cardiac and Pulmonary Rehab  Referring Provider  Steele Sizer MD      Initial Encounter Date:    Pulmonary Rehab from 01/12/2017 in [facility] Cardiac and Pulmonary Rehab  Date  01/12/17  Referring Provider  Steele Sizer MD      Visit Diagnosis: H/O lung transplant [facility])  Patient's Home Medications on Admission:  Current Outpatient Medications:  .  acetaminophen (TYLENOL) 500 MG tablet, Take 1 tablet by mouth every 4 (four) hours as needed., Disp: , Rfl:  .  Alum & Mag Hydroxide-Simeth (GI COCKTAIL) SUSP suspension, Take 30 mLs by mouth 2 (two) times daily as needed for indigestion. Shake well., Disp: 120 mL, Rfl: 0 .  aspirin 81 MG chewable tablet, Chew 1 tablet by mouth daily., Disp: , Rfl:  .  azaTHIOprine (IMURAN) 50 MG tablet, Take 1 tablet (50 mg total) by mouth daily., Disp: 60 tablet, Rfl: 0 .  Calcium Citrate-Vitamin D (CALCIUM CITRATE+D3 PETITES) 200-250 MG-UNIT TABS, Take 3 tablets by mouth 2 (two) times daily., Disp: , Rfl:  .  cholecalciferol (VITAMIN D) 1000 UNITS tablet, Take 2,000 Units by mouth daily., Disp: , Rfl:  .  fluconazole (DIFLUCAN) 200 MG tablet, TAKE 2 TABLETS (400 MG TOTAL) BY MOUTH ONCE DAILY., Disp: , Rfl: 3 .  fluticasone (FLONASE) 50 MCG/ACT nasal spray, PLACE 2 SPRAYS INTO BOTH NOSTRILS DAILY., Disp: 16 g, Rfl: 6 .  Immune Globulin, Human, 2.5 GM/25ML SOLN, Inject into the vein., Disp: , Rfl:  .  loratadine (CLARITIN) 10 MG tablet, TAKE 1 TABLET (10 MG TOTAL) BY MOUTH 2 (TWO) TIMES DAILY., Disp: 60 tablet, Rfl: 3 .  magnesium oxide (MAG-OX) 400 MG tablet, Take 1 tablet by mouth daily., Disp: , Rfl:  .  metoprolol tartrate (LOPRESSOR) 25 MG tablet, Take 0.5 tablets by mouth 2 (two) times daily., Disp: , Rfl: 11 .  Multiple Vitamin (MULTI-VITAMINS) TABS, Take 1 tablet by mouth  daily., Disp: , Rfl:  .  OLANZapine (ZYPREXA) 5 MG tablet, Take 1 tablet by mouth daily., Disp: , Rfl: 0 .  omeprazole (PRILOSEC) 20 MG capsule, TAKE ONE CAPSULE BY MOUTH IN THE MORNING FOR GERD, Disp: 30 capsule, Rfl: 0 .  ondansetron (ZOFRAN) 4 MG tablet, Take 1 tablet (4 mg total) by mouth every 8 (eight) hours as needed for nausea or vomiting., Disp: 30 tablet, Rfl: 0 .  oxyCODONE (OXY IR/ROXICODONE) 5 MG immediate release tablet, Take 1 tablet by mouth as needed., Disp: , Rfl: 0 .  predniSONE (DELTASONE) 5 MG tablet, Take 4 tablets by mouth 4 (four) times daily., Disp: , Rfl:  .  Skin Protectants, Misc. (EUCERIN) cream, Apply topically., Disp: , Rfl:  .  sulfamethoxazole-trimethoprim (BACTRIM DS,SEPTRA DS) 800-160 MG tablet, TAKE 1 TABLET BY MOUTH 3 (THREE) TIMES A WEEK., Disp: , Rfl: 5 .  tacrolimus (PROGRAF) 1 MG capsule, Take 1 capsule by mouth every 12 (twelve) hours., Disp: , Rfl:  .  valGANciclovir (VALCYTE) 450 MG tablet, Take 1 tablet by mouth daily., Disp: , Rfl:  .  VENTOLIN HFA 108 (90 Base) MCG/ACT inhaler, INHALE 2 PUFFS INTO THE LUNGS EVERY 4 (FOUR) HOURS AS NEEDED FOR WHEEZING OR SHORTNESS OF BREATH., Disp: 18 g, Rfl: 5 .  vitamin B-12 (CYANOCOBALAMIN) 250 MCG tablet, Place 250 mcg under the tongue every other day. , Disp: , Rfl:  Past Medical History: Past Medical History:  Diagnosis Date  . Abnormal hemoglobin (HCC)   . Allergy   . Anxiety   . Asthma   . Controlled insomnia   . Erythrocytosis   . GERD (gastroesophageal reflux disease)   . Hep C w/o coma, chronic ([place])   . Hypertension   . IBS (irritable bowel syndrome)   . Lumbago   . Mitral valve prolapse   . Oxygen dependent   . Plantar wart   . Pulmonary fibrosis ([place])   . Raynaud disease   . Scleroderma ([place])    Dr. Pennie Banter  . Seasonal allergies   . Spondylolisthesis   . Symptomatic menopausal or female climacteric states     Tobacco Use: Social History   Tobacco Use  Smoking Status Never Smoker   Smokeless Tobacco Never Used    Labs: Recent Review Scientist, physiological    Labs for ITP Cardiac and Pulmonary Rehab Latest Ref Rng & Units 07/14/2013 07/05/2015 08/02/2015   Cholestrol 100 - 199 mg/dL 169 - 196   LDLCALC 0 - 99 mg/dL 95 - 112(H)   HDL >39 mg/dL 58 - 68   Trlycerides 0 - 149 mg/dL 78 - 78   Hemoglobin A1c 4.8 - 5.6 % - 6.3(H) -       Pulmonary Assessment Scores: Pulmonary Assessment Scores    Row Name 01/12/17 1521         ADL UCSD   ADL Phase  Entry     SOB Score total  22     Rest  0     Walk  0     Stairs  2     Bath  1     Dress  0     Shop  1       CAT Score   CAT Score  14       mMRC Score   mMRC Score  2        Pulmonary Function Assessment: Pulmonary Function Assessment - 01/12/17 1654      Breath   Bilateral Breath Sounds  Clear;Decreased    Shortness of Breath  No       Exercise Target Goals:    Exercise Program Goal: Individual exercise prescription set with THRR, safety & activity barriers. Participant demonstrates ability to understand and report RPE using BORG scale, to self-measure pulse accurately, and to acknowledge the importance of the exercise prescription.  Exercise Prescription Goal: Starting with aerobic activity 30 plus minutes a day, 3 days per week for initial exercise prescription. Provide home exercise prescription and guidelines that participant acknowledges understanding prior to discharge.  Activity Barriers & Risk Stratification: Activity Barriers & Cardiac Risk Stratification - 01/12/17 1601      Activity Barriers & Cardiac Risk Stratification   Activity Barriers  Deconditioning;Muscular Weakness;Shortness of Breath;Assistive Device;Balance Concerns using rollator for support for walking       6 Minute Walk: 6 Minute Walk    Row Name 01/12/17 1558         6 Minute Walk   Phase  Initial     Distance  602 feet using rollator     Walk Time  5.8 minutes     # of Rest Breaks  2 4 sec, 8 sec     MPH  1.14      METS  2.42     RPE  13     Perceived Dyspnea   0     VO2 Peak  8.47     Symptoms  Yes (comment)     Comments  legs feel like jello     Resting HR  105 bpm     Resting BP  128/70     Max Ex. HR  121 bpm     Max Ex. BP  124/66     2 Minute Post BP  126/60       Interval HR   Baseline HR (retired)  105     1 Minute HR  117     2 Minute HR  118     3 Minute HR  121     4 Minute HR  119     5 Minute HR  119     6 Minute HR  118     2 Minute Post HR  110     Interval Heart Rate?  Yes       Interval Oxygen   Interval Oxygen?  Yes     Baseline Oxygen Saturation %  97 %     Resting Liters of Oxygen  0 L Room Air     1 Minute Oxygen Saturation %  100 %     1 Minute Liters of Oxygen  0 L     2 Minute Oxygen Saturation %  100 %     2 Minute Liters of Oxygen  0 L     3 Minute Oxygen Saturation %  100 %     3 Minute Liters of Oxygen  0 L     4 Minute Oxygen Saturation %  100 %     4 Minute Liters of Oxygen  0 L     5 Minute Oxygen Saturation %  100 %     5 Minute Liters of Oxygen  0 L     6 Minute Oxygen Saturation %  100 %     6 Minute Liters of Oxygen  0 L     2 Minute Post Oxygen Saturation %  100 %     2 Minute Post Liters of Oxygen  0 L       Oxygen Initial Assessment: Oxygen Initial Assessment - 01/12/17 1651      Home Oxygen   Home Oxygen Device  None    Sleep Oxygen Prescription  None    Home Exercise Oxygen Prescription  None    Home at Rest Exercise Oxygen Prescription  None      Intervention   Short Term Goals  To learn and understand importance of monitoring SPO2 with pulse oximeter and demonstrate accurate use of the pulse oximeter.;To Learn and understand importance of maintaining oxygen saturations>88%;To learn and demonstrate proper purse lipped breathing techniques or other breathing techniques.;To learn and demonstrate proper use of respiratory medications    Long  Term Goals  Verbalizes importance of monitoring SPO2 with pulse oximeter and return  demonstration;Exhibits proper breathing techniques, such as purse lipped breathing or other method taught during program session;Demonstrates proper use of MDI's;Compliance with respiratory medication;Maintenance of O2 saturations>88%       Oxygen Re-Evaluation: Oxygen Re-Evaluation    Row Name 01/16/17 1251 02/18/17 1216 02/25/17 1450         Program Oxygen Prescription   Program Oxygen Prescription  -  None  None       Home Oxygen   Home Oxygen Device  -  None  None     Sleep Oxygen Prescription  -  None  None  Home Exercise Oxygen Prescription  -  None  None     Home at Rest Exercise Oxygen Prescription  -  None  None     Compliance with Home Oxygen Use  -  Yes  Yes       Goals/Expected Outcomes   Short Term Goals  To learn and demonstrate proper purse lipped breathing techniques or other breathing techniques.  To learn and understand importance of monitoring SPO2 with pulse oximeter and demonstrate accurate use of the pulse oximeter.;To learn and understand importance of maintaining oxygen saturations>88%;To learn and demonstrate proper pursed lip breathing techniques or other breathing techniques.;To learn and demonstrate proper use of respiratory medications  To learn and understand importance of maintaining oxygen saturations>88%;To learn and understand importance of monitoring SPO2 with pulse oximeter and demonstrate accurate use of the pulse oximeter.;To learn and demonstrate proper pursed lip breathing techniques or other breathing techniques.     Long  Term Goals  Exhibits proper breathing techniques, such as purse lipped breathing or other method taught during program session  Maintenance of O2 saturations>88%;Compliance with respiratory medication;Verbalizes importance of monitoring SPO2 with pulse oximeter and return demonstration;Exhibits proper breathing techniques, such as pursed lip breathing or other method taught during program session;Demonstrates proper use of MDI's   Maintenance of O2 saturations>88%;Compliance with respiratory medication;Demonstrates proper use of MDI's;Exhibits proper breathing techniques, such as pursed lip breathing or other method taught during program session;Verbalizes importance of monitoring SPO2 with pulse oximeter and return demonstration     Comments  Reviewed PLB technique with pt.  Talked about how it work and it's important to maintaining his exercise saturations.   Maintaining oxygen saturations at home and monitors with pulse oximeter.  She does have some side effects from anti-rejection medications including prednisone.  She is doing well onother meds, occasionally she will still use her emergency inhaler during coughing spells and drainage from allergies.   Continues to use her pursed lip breathing and has noticed that is has become a habit for her.    Stela has checks her oxygen at home everyday and makes sure her oxygen saturations are above 88 percent. She does PLB well but could use more teaching. She has not been using oxygen at home. She knows how to use her albuterol inhaler but states she has not needed it.      Goals/Expected Outcomes  Short: Become more profiecient at using PLB.   Long: Become independent at using PLB.  Short: Continue monitor oxygen saturations at home Long: Continue to manage care post transplant and adjust to anit-rejection meds.  Short: work on PLB techniques. Long: Be proficient at PLB        Oxygen Discharge (Final Oxygen Re-Evaluation): Oxygen Re-Evaluation - 02/25/17 1450      Program Oxygen Prescription   Program Oxygen Prescription  None      Home Oxygen   Home Oxygen Device  None    Sleep Oxygen Prescription  None    Home Exercise Oxygen Prescription  None    Home at Rest Exercise Oxygen Prescription  None    Compliance with Home Oxygen Use  Yes      Goals/Expected Outcomes   Short Term Goals  To learn and understand importance of maintaining oxygen saturations>88%;To learn and  understand importance of monitoring SPO2 with pulse oximeter and demonstrate accurate use of the pulse oximeter.;To learn and demonstrate proper pursed lip breathing techniques or other breathing techniques.    Long  Term Goals  Maintenance  of O2 saturations>88%;Compliance with respiratory medication;Demonstrates proper use of MDI's;Exhibits proper breathing techniques, such as pursed lip breathing or other method taught during program session;Verbalizes importance of monitoring SPO2 with pulse oximeter and return demonstration    Comments  Zahniya has checks her oxygen at home everyday and makes sure her oxygen saturations are above 88 percent. She does PLB well but could use more teaching. She has not been using oxygen at home. She knows how to use her albuterol inhaler but states she has not needed it.     Goals/Expected Outcomes  Short: work on PLB techniques. Long: Be proficient at PLB       Initial Exercise Prescription: Initial Exercise Prescription - 01/12/17 1600      Date of Initial Exercise RX and Referring Provider   Date  01/12/17    Referring Provider  Steele Sizer MD      NuStep   Level  1    SPM  80    Minutes  15    METs  2      REL-XR   Level  1    Speed  50    Minutes  15    METs  2      Track   Laps  21    Minutes  15    METs  2      Prescription Details   Frequency (times per week)  3    Duration  Progress to 45 minutes of aerobic exercise without signs/symptoms of physical distress      Intensity   THRR 40-80% of Max Heartrate  126-147    Ratings of Perceived Exertion  11-13    Perceived Dyspnea  0-4      Progression   Progression  Continue to progress workloads to maintain intensity without signs/symptoms of physical distress.      Resistance Training   Training Prescription  Yes    Weight  3 lbs    Reps  10-15       Perform Capillary Blood Glucose checks as needed.  Exercise Prescription Changes: Exercise Prescription Changes    Row Name  01/12/17 1600 01/20/17 1600 02/04/17 1600 02/17/17 1400 02/18/17 1200     Response to Exercise   Blood Pressure (Admit)  128/70  122/78  110/78  119/64  -   Blood Pressure (Exercise)  124/66  -  -  -  -   Blood Pressure (Exit)  126/60  110/74  112/62  110/64  -   Heart Rate (Admit)  105 bpm  96 bpm  68 bpm  99 bpm  -   Heart Rate (Exercise)  121 bpm  101 bpm  78 bpm  75 bpm  -   Heart Rate (Exit)  110 bpm  83 bpm  86 bpm  75 bpm  -   Oxygen Saturation (Admit)  97 %  96 %  96 %  99 %  -   Oxygen Saturation (Exercise)  100 %  95 %  97 %  96 %  -   Oxygen Saturation (Exit)  100 %  99 %  99 %  100 %  -   Rating of Perceived Exertion (Exercise)  _0 -   Perceived Dyspnea (Exercise)  0  0  1  2  -   Symptoms  legs felt like jello  fatigue  fatigue  -  -   Comments  walk test results  first  full day of exercise  second day of exercise  third day of exercise  -   Duration  -  Progress to 45 minutes of aerobic exercise without signs/symptoms of physical distress  Progress to 45 minutes of aerobic exercise without signs/symptoms of physical distress  Continue with 45 min of aerobic exercise without signs/symptoms of physical distress.  -   Intensity  -  THRR unchanged  THRR unchanged  THRR unchanged  -     Progression   Progression  -  Continue to progress workloads to maintain intensity without signs/symptoms of physical distress.  Continue to progress workloads to maintain intensity without signs/symptoms of physical distress.  Continue to progress workloads to maintain intensity without signs/symptoms of physical distress.  -   Average METs  -  2.7  2.85  1.46  -     Resistance Training   Training Prescription  -  Yes  Yes  Yes  -   Weight  -  3 lbs  3 lbs  3 lbs  -   Reps  -  10-15  10-15  10-15  -     Interval Training   Interval Training  -  No  No  No  -     NuStep   Level  -  1  -  1  -   SPM  -  41  -  52  -   Minutes  -  15  -  15  -   METs  -  1.6  -  1.2  -      REL-XR   Level  -  _0 -   Speed  -  _1 -   Minutes  -  _2 -   METs  -  3.8  4.2  1.5  -     Track   Laps  -  -  11  15  -   Minutes  -  -  15  15  -   METs  -  -  1.5  1.7  -     Home Exercise Plan   Plans to continue exercise at  -  -  -  -  Home (comment) walking, recumbent bike, complex gym   Frequency  -  -  -  -  Add 1 additional day to program exercise sessions.   Initial Home Exercises Provided  -  -  -  -  02/18/17   Row Name 03/03/17 1500 03/17/17 1600           Response to Exercise   Blood Pressure (Admit)  130/70  92/54      Blood Pressure (Exit)  124/74  102/72      Heart Rate (Admit)  74 bpm  72 bpm      Heart Rate (Exercise)  96 bpm  78 bpm      Heart Rate (Exit)  70 bpm  118 bpm      Oxygen Saturation (Admit)  99 %  94 %      Oxygen Saturation (Exercise)  94 %  97 %      Oxygen Saturation (Exit)  99 %  94 %      Rating of Perceived Exertion (Exercise)  12  12      Perceived Dyspnea (Exercise)  1  1      Symptoms  fatigue  fatigue  Duration  Continue with 45 min of aerobic exercise without signs/symptoms of physical distress.  Continue with 45 min of aerobic exercise without signs/symptoms of physical distress.      Intensity  THRR unchanged  THRR unchanged        Progression   Progression  Continue to progress workloads to maintain intensity without signs/symptoms of physical distress.  Continue to progress workloads to maintain intensity without signs/symptoms of physical distress.      Average METs  1.25  1.55        Resistance Training   Training Prescription  Yes  Yes      Weight  3 lbs  3 lbs      Reps  10-15  10-15        Interval Training   Interval Training  No  No        NuStep   Level  1  2      SPM  77  63      Minutes  15  15      METs  1.4  1.4        REL-XR   Level  1  1      Speed  26  12      Minutes  15  15      METs  1.1  1        Track   Laps  - has not been walking  15      Minutes  -  15       METs  -  1.7        Home Exercise Plan   Plans to continue exercise at  Home (comment) walking, recumbent bike, complex gym  Home (comment) walking, recumbent bike, complex gym      Frequency  Add 1 additional day to program exercise sessions.  Add 1 additional day to program exercise sessions.      Initial Home Exercises Provided  02/18/17  02/18/17         Exercise Comments: Exercise Comments    Row Name 01/16/17 1150           Exercise Comments  First full day of exercise!  Patient was oriented to gym and equipment including functions, settings, policies, and procedures.  Patient's individual exercise prescription and treatment plan were reviewed.  All starting workloads were established based on the results of the 6 minute walk test done at initial orientation visit.  The plan for exercise progression was also introduced and progression will be customized based on patient's performance and goals.          Exercise Goals and Review: Exercise Goals    Row Name 01/12/17 1606             Exercise Goals   Increase Physical Activity  Yes       Intervention  Provide advice, education, support and counseling about physical activity/exercise needs.;Develop an individualized exercise prescription for aerobic and resistive training based on initial evaluation findings, risk stratification, comorbidities and participant's personal goals.       Expected Outcomes  Achievement of increased cardiorespiratory fitness and enhanced flexibility, muscular endurance and strength shown through measurements of functional capacity and personal statement of participant.       Increase Strength and Stamina  Yes       Intervention  Provide advice, education, support and counseling about physical activity/exercise needs.;Develop an individualized exercise prescription for aerobic and resistive training based on  initial evaluation findings, risk stratification, comorbidities and participant's personal goals.        Expected Outcomes  Achievement of increased cardiorespiratory fitness and enhanced flexibility, muscular endurance and strength shown through measurements of functional capacity and personal statement of participant.          Exercise Goals Re-Evaluation : Exercise Goals Re-Evaluation    Row Name 01/20/17 1655 02/04/17 1554 02/17/17 1418 02/18/17 1223 03/03/17 1515     Exercise Goal Re-Evaluation   Exercise Goals Review  Increase Physical Activity;Increase Strenth and Stamina  Increase Physical Activity;Increase Strength and Stamina  Increase Physical Activity;Increase Strength and Stamina  Increase Physical Activity;Increase Strength and Stamina;Understanding of Exercise Prescription;Knowledge and understanding of Target Heart Rate Range (THRR);Able to check pulse independently  Increase Physical Activity;Increase Strength and Stamina   Comments  Diane has completed her first full day of exercise.  She has also had some transportation issues and was not able to make it in for her next visit.  We will continue to work on her progression.   Diane has only attended one visit since last update. She continues to have transportation issues.  We will continue to work on her progression.  Diane returned yesterday after being out for almost 3 weeks.  She has not been feeling well but could tell she was just getting weaker sitting at home.  She was able to come back without any problems.  We will continue to monitor her progression.   Strength and stamina are starting to come back and are her biggest limitation for doing things around the house.  She is able to do more at home and paces herself.  Her other goal was to get rid of her rollator.  She is able to get around the house some without but she is afraid of falling.  Diane knows that she really needs to focus on buidling up her strength back up to help with this. Reviewed home exercise with pt today.  Pt plans to walk at [place] for exercise.   She is going to build up to being able to walk entire [place].  For the rest of the 30 min she is going to use her recumbent bike and possibly go to complex gym.  Reviewed THR, pulse, RPE, sign and symptoms, and when to call 911 or MD.  Also discussed weather considerations and indoor options.  Pt voiced understanding.  Diane has been doing well in rehab.  She needs lots of encouragement to walk.  She is up to 1.4 METs on the NuStep.  We will continue to monitor her progress.    Expected Outcomes  Short: Attend pulmonary rehab classes regularly.  Long: Work on Animator.   Short: Attend pulmonary rehab classes regularly.  Long: Work on Animator.   Short: Attend rehab regularly. Long: Build up strength and stamina.   Short: Start walking [place] when not here (adding in at least one of home exercise) Long: Building up strength to be able walk without rollator.  Short: Encourage her to walk more here and at home.  Long: Continue to work on building up strength and stamina.    [place] Name 03/17/17 1614 04/02/17 1350 04/14/17 1344         Exercise Goal Re-Evaluation   Exercise Goals Review  Increase Physical Activity;Increase Strength and Stamina  -  -     Comments  Diane continues to do well in rehab.  She is back  to walking more again.  She has moved up to level 2 on the NuStep.  We will continue to monitor her progression.   Diane has been out since last review.  Diane has been out since last review.     Expected Outcomes  Short: Continue to encourage her walking. Long: Continue to build her strength and stamina.   -  -        Discharge Exercise Prescription (Final Exercise Prescription Changes): Exercise Prescription Changes - 03/17/17 1600      Response to Exercise   Blood Pressure (Admit)  92/54    Blood Pressure (Exit)  102/72    Heart Rate (Admit)  72 bpm    Heart Rate (Exercise)  78 bpm    Heart Rate (Exit)  118 bpm    Oxygen Saturation (Admit)   94 %    Oxygen Saturation (Exercise)  97 %    Oxygen Saturation (Exit)  94 %    Rating of Perceived Exertion (Exercise)  12    Perceived Dyspnea (Exercise)  1    Symptoms  fatigue    Duration  Continue with 45 min of aerobic exercise without signs/symptoms of physical distress.    Intensity  THRR unchanged      Progression   Progression  Continue to progress workloads to maintain intensity without signs/symptoms of physical distress.    Average METs  1.55      Resistance Training   Training Prescription  Yes    Weight  3 lbs    Reps  10-15      Interval Training   Interval Training  No      NuStep   Level  2    SPM  63    Minutes  15    METs  1.4      REL-XR   Level  1    Speed  12    Minutes  15    METs  1      Track   Laps  15    Minutes  15    METs  1.7      Home Exercise Plan   Plans to continue exercise at  Home (comment) walking, recumbent bike, complex gym    Frequency  Add 1 additional day to program exercise sessions.    Initial Home Exercises Provided  02/18/17       Nutrition:  Target Goals: Understanding of nutrition guidelines, daily intake of sodium <1556m,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

## 2017-04-28 DIAGNOSIS — Z7689 Persons encountering health services in other specified circumstances: Secondary | ICD-10-CM | POA: Diagnosis not present

## 2017-04-28 DIAGNOSIS — R768 Other specified abnormal immunological findings in serum: Secondary | ICD-10-CM | POA: Diagnosis not present

## 2017-04-28 DIAGNOSIS — Z7682 Awaiting organ transplant status: Secondary | ICD-10-CM | POA: Diagnosis not present

## 2017-04-28 DIAGNOSIS — J9610 Chronic respiratory failure, unspecified whether with hypoxia or hypercapnia: Secondary | ICD-10-CM | POA: Diagnosis not present

## 2017-05-05 ENCOUNTER — Telehealth: Payer: Self-pay

## 2017-05-05 DIAGNOSIS — R942 Abnormal results of pulmonary function studies: Secondary | ICD-10-CM | POA: Diagnosis not present

## 2017-05-05 DIAGNOSIS — B259 Cytomegaloviral disease, unspecified: Secondary | ICD-10-CM | POA: Diagnosis not present

## 2017-05-05 DIAGNOSIS — J988 Other specified respiratory disorders: Secondary | ICD-10-CM | POA: Diagnosis not present

## 2017-05-05 DIAGNOSIS — T86818 Other complications of lung transplant: Secondary | ICD-10-CM | POA: Diagnosis not present

## 2017-05-05 DIAGNOSIS — Z79899 Other long term (current) drug therapy: Secondary | ICD-10-CM | POA: Diagnosis not present

## 2017-05-05 DIAGNOSIS — Z4824 Encounter for aftercare following lung transplant: Secondary | ICD-10-CM | POA: Diagnosis not present

## 2017-05-05 DIAGNOSIS — K219 Gastro-esophageal reflux disease without esophagitis: Secondary | ICD-10-CM | POA: Diagnosis not present

## 2017-05-05 DIAGNOSIS — Z452 Encounter for adjustment and management of vascular access device: Secondary | ICD-10-CM | POA: Diagnosis not present

## 2017-05-05 DIAGNOSIS — Z942 Lung transplant status: Secondary | ICD-10-CM | POA: Diagnosis not present

## 2017-05-05 NOTE — Telephone Encounter (Signed)
Left a message for Erika Martinez to see how she was doing and when she would be able to return to [place].

## 2017-05-06 DIAGNOSIS — R768 Other specified abnormal immunological findings in serum: Secondary | ICD-10-CM | POA: Diagnosis not present

## 2017-05-06 DIAGNOSIS — J9610 Chronic respiratory failure, unspecified whether with hypoxia or hypercapnia: Secondary | ICD-10-CM | POA: Diagnosis not present

## 2017-05-06 DIAGNOSIS — Z7682 Awaiting organ transplant status: Secondary | ICD-10-CM | POA: Diagnosis not present

## 2017-05-11 DIAGNOSIS — Z7682 Awaiting organ transplant status: Secondary | ICD-10-CM | POA: Diagnosis not present

## 2017-05-11 DIAGNOSIS — J9610 Chronic respiratory failure, unspecified whether with hypoxia or hypercapnia: Secondary | ICD-10-CM | POA: Diagnosis not present

## 2017-05-11 DIAGNOSIS — R768 Other specified abnormal immunological findings in serum: Secondary | ICD-10-CM | POA: Diagnosis not present

## 2017-05-12 DIAGNOSIS — B259 Cytomegaloviral disease, unspecified: Secondary | ICD-10-CM | POA: Diagnosis not present

## 2017-05-12 DIAGNOSIS — Z942 Lung transplant status: Secondary | ICD-10-CM | POA: Diagnosis not present

## 2017-05-12 DIAGNOSIS — Z79899 Other long term (current) drug therapy: Secondary | ICD-10-CM | POA: Diagnosis not present

## 2017-05-21 ENCOUNTER — Telehealth: Payer: Self-pay

## 2017-05-21 DIAGNOSIS — Z942 Lung transplant status: Secondary | ICD-10-CM

## 2017-05-21 DIAGNOSIS — Z79899 Other long term (current) drug therapy: Secondary | ICD-10-CM | POA: Diagnosis not present

## 2017-05-21 NOTE — Progress Notes (Signed)
Discharge Progress Report  Patient Details  Name: Erika Martinez MRN: [id number] Date of Birth: 04-07-1956 Referring Provider:     Pulmonary Rehab from 01/12/2017 in [facility]  Referring Provider  Steele Sizer MD       Number of Visits: 10/36  Reason for Discharge:  Early Exit:  Personal and Lack of attendance  Smoking History:  Social History   Tobacco Use  Smoking Status Never Smoker  Smokeless Tobacco Never Used    Diagnosis:  H/O lung transplant ([place])  ADL [facility]SD: Pulmonary Assessment Scores    Row Name 01/12/17 1521         ADL [facility]   ADL Phase  Entry     SOB Score total  22     Rest  0     Walk  0     Stairs  2     Bath  1     Dress  0     Shop  1       CAT Score   CAT Score  14       mMRC Score   mMRC Score  2        Initial Exercise Prescription: Initial Exercise Prescription - 01/12/17 1600      Date of Initial Exercise RX and Referring Provider   Date  01/12/17    Referring Provider  Steele Sizer MD      NuStep   Level  1    SPM  80    Minutes  15    METs  2      REL-XR   Level  1    Speed  50    Minutes  15    METs  2      Track   Laps  21    Minutes  15    METs  2      Prescription Details   Frequency (times per week)  3    Duration  Progress to 45 minutes of aerobic exercise without signs/symptoms of physical distress      Intensity   THRR 40-80% of Max Heartrate  126-147    Ratings of Perceived Exertion  11-13    Perceived Dyspnea  0-4      Progression   Progression  Continue to progress workloads to maintain intensity without signs/symptoms of physical distress.      Resistance Training   Training Prescription  Yes    Weight  3 lbs    Reps  10-15       Discharge Exercise Prescription (Final Exercise Prescription Changes): Exercise Prescription Changes - 03/17/17 1600      Response to Exercise   Blood Pressure (Admit)  92/54    Blood Pressure (Exit)  102/72    Heart Rate (Admit)   72 bpm    Heart Rate (Exercise)  78 bpm    Heart Rate (Exit)  118 bpm    Oxygen Saturation (Admit)  94 %    Oxygen Saturation (Exercise)  97 %    Oxygen Saturation (Exit)  94 %    Rating of Perceived Exertion (Exercise)  12    Perceived Dyspnea (Exercise)  1    Symptoms  fatigue    Duration  Continue with 45 min of aerobic exercise without signs/symptoms of physical distress.    Intensity  THRR unchanged      Progression   Progression  Continue to progress workloads to maintain intensity without signs/symptoms of  physical distress.    Average METs  1.55      Resistance Training   Training Prescription  Yes    Weight  3 lbs    Reps  10-15      Interval Training   Interval Training  No      NuStep   Level  2    SPM  63    Minutes  15    METs  1.4      REL-XR   Level  1    Speed  12    Minutes  15    METs  1      Track   Laps  15    Minutes  15    METs  1.7      Home Exercise Plan   Plans to continue exercise at  Home (comment) walking, recumbent bike, complex gym    Frequency  Add 1 additional day to program exercise sessions.    Initial Home Exercises Provided  02/18/17       Functional Capacity: 6 Minute Walk    Row Name 01/12/17 1558         6 Minute Walk   Phase  Initial     Distance  602 feet using rollator     Walk Time  5.8 minutes     # of Rest Breaks  2 4 sec, 8 sec     MPH  1.14     METS  2.42     RPE  13     Perceived Dyspnea   0     VO2 Peak  8.47     Symptoms  Yes (comment)     Comments  legs feel like jello     Resting HR  105 bpm     Resting BP  128/70     Max Ex. HR  121 bpm     Max Ex. BP  124/66     2 Minute Post BP  126/60       Interval HR   Baseline HR (retired)  105     1 Minute HR  117     2 Minute HR  118     3 Minute HR  121     4 Minute HR  119     5 Minute HR  119     6 Minute HR  118     2 Minute Post HR  110     Interval Heart Rate?  Yes       Interval Oxygen   Interval Oxygen?  Yes     Baseline Oxygen  Saturation %  97 %     Resting Liters of Oxygen  0 L Room Air     1 Minute Oxygen Saturation %  100 %     1 Minute Liters of Oxygen  0 L     2 Minute Oxygen Saturation %  100 %     2 Minute Liters of Oxygen  0 L     3 Minute Oxygen Saturation %  100 %     3 Minute Liters of Oxygen  0 L     4 Minute Oxygen Saturation %  100 %     4 Minute Liters of Oxygen  0 L     5 Minute Oxygen Saturation %  100 %     5 Minute Liters of Oxygen  0 L     6 Minute Oxygen Saturation %  100 %     6 Minute Liters of Oxygen  0 L     2 Minute Post Oxygen Saturation %  100 %     2 Minute Post Liters of Oxygen  0 L        Psychological, QOL, Others - Outcomes: PHQ 2/9: Depression screen [facility] 2/9 01/12/2017 12/02/2016 06/27/2016 03/26/2016 12/14/2015  Decreased Interest 0 0 0 0 1  Down, Depressed, Hopeless 1 0 0 0 1  PHQ - 2 Score 1 0 0 0 2  Altered sleeping 0 - - - 1  Tired, decreased energy 2 - - - 2  Change in appetite 1 - - - 2  Feeling bad or failure about yourself  0 - - - 1  Trouble concentrating 0 - - - 0  Moving slowly or fidgety/restless 0 - - - 0  Suicidal thoughts 0 - - - 0  PHQ-9 Score 4 - - - 8  Difficult doing work/chores Not difficult at all - - - Somewhat difficult  Some recent data might be hidden    Quality of Life:   Personal Goals: Goals established at orientation with interventions provided to work toward goal. Personal Goals and Risk Factors at Admission - 01/12/17 1527      Core Components/Risk Factors/Patient Goals on Admission    Weight Management  Yes;Weight Gain    Intervention  Weight Management: Develop a combined nutrition and exercise program designed to reach desired caloric intake, while maintaining appropriate intake of nutrient and fiber, sodium and fats, and appropriate energy expenditure required for the weight goal.;Weight Management: Provide education and appropriate resources to help participant work on and attain dietary goals.;Weight Management/Obesity:  Establish reasonable short term and long term weight goals.    Admit Weight  111 lb (50.3 kg)    Goal Weight: Short Term  116 lb (52.6 kg)    Goal Weight: Long Term  120 lb (54.4 kg)    Expected Outcomes  Short Term: Continue to assess and modify interventions until short term weight is achieved;Long Term: Adherence to nutrition and physical activity/exercise program aimed toward attainment of established weight goal;Weight Maintenance: Understanding of the daily nutrition guidelines, which includes 25-35% calories from fat, 7% or less cal from saturated fats, less than 200mg  cholesterol, less than 1.5gm of sodium, & 5 or more servings of fruits and vegetables daily;Understanding recommendations for meals to include 15-35% energy as protein, 25-35% energy from fat, 35-60% energy from carbohydrates, less than 200mg  of dietary cholesterol, 20-35 gm of total fiber daily;Understanding of distribution of calorie intake throughout the day with the consumption of 4-5 meals/snacks;Weight Gain: Understanding of general recommendations for a high calorie, high protein meal plan that promotes weight gain by distributing calorie intake throughout the day with the consumption for 4-5 meals, snacks, and/or supplements    Improve shortness of breath with ADL's  Yes    Intervention  Provide education, individualized exercise plan and daily activity instruction to help decrease symptoms of SOB with activities of daily living.    Expected Outcomes  Short Term: Achieves a reduction of symptoms when performing activities of daily living.    Develop more efficient breathing techniques such as purse lipped breathing and diaphragmatic breathing; and practicing self-pacing with activity  Yes    Intervention  Provide education, demonstration and support about specific breathing techniuqes utilized for more efficient breathing. Include techniques such as pursed lipped breathing, diaphragmatic breathing and self-pacing activity.     Expected Outcomes  Short  Term: Participant will be able to demonstrate and use breathing techniques as needed throughout daily activities.    Increase knowledge of respiratory medications and ability to use respiratory devices properly   Yes    Intervention  Provide education and demonstration as needed of appropriate use of medications, inhalers, and oxygen therapy.    Expected Outcomes  Short Term: Achieves understanding of medications use. Understands that oxygen is a medication prescribed by physician. Demonstrates appropriate use of inhaler and oxygen therapy.    Personal Goal  Wants to be able to build her pulmonary support group    Intervention  Meet more than once a month with her pulmonary support group    Expected Outcomes  Short: Meet with pulmonary support group. Long: Make the pulmonary support group expand.        Personal Goals Discharge: Goals and Risk Factor Review    Row Name 02/18/17 1204             Core Components/Risk Factors/Patient Goals Review   Personal Goals Review  Weight Management/Obesity;Improve shortness of breath with ADL's;Other       Review  Weight is finally starting to come up.  She hasn't been able to eat but appetite is starting to come back which will help.  She is able to do most of the things around the house, but her breathing is not the limiting factor; it's her stamina!! Still wants to get support group going but Diane and other group members have been sick as well.       Expected Outcomes  Short: Work on weight gain.  Long: Work on getting support group back together.           Exercise Goals and Review: Exercise Goals    Row Name 01/12/17 1606             Exercise Goals   Increase Physical Activity  Yes       Intervention  Provide advice, education, support and counseling about physical activity/exercise needs.;Develop an individualized exercise prescription for aerobic and resistive training based on initial evaluation findings, risk  stratification, comorbidities and participant's personal goals.       Expected Outcomes  Achievement of increased cardiorespiratory fitness and enhanced flexibility, muscular endurance and strength shown through measurements of functional capacity and personal statement of participant.       Increase Strength and Stamina  Yes       Intervention  Provide advice, education, support and counseling about physical activity/exercise needs.;Develop an individualized exercise prescription for aerobic and resistive training based on initial evaluation findings, risk stratification, comorbidities and participant's personal goals.       Expected Outcomes  Achievement of increased cardiorespiratory fitness and enhanced flexibility, muscular endurance and strength shown through measurements of functional capacity and personal statement of participant.          Nutrition & Weight - Outcomes: Pre Biometrics - 01/12/17 1607      Pre Biometrics   Height  5\' 1"  (1.549 m)    Weight  109 lb 1.6 oz (49.5 kg)    Waist Circumference  28 inches    Hip Circumference  31 inches    Waist to Hip Ratio  0.9 %    BMI (Calculated)  20.7        Nutrition: Nutrition Therapy & Goals - 01/12/17 1526      Intervention Plan   Intervention  Prescribe, educate and counsel regarding individualized specific dietary modifications aiming towards targeted core  components such as weight, hypertension, lipid management, diabetes, heart failure and other comorbidities.;Nutrition handout(s) given to patient.    Expected Outcomes  Short Term Goal: Understand basic principles of dietary content, such as calories, fat, sodium, cholesterol and nutrients.;Short Term Goal: A plan has been developed with personal nutrition goals set during dietitian appointment.;Long Term Goal: Adherence to prescribed nutrition plan.       Nutrition Discharge: Nutrition Assessments - 01/12/17 1629      MEDFICTS Scores   Pre Score  27        Education Questionnaire Score: Knowledge Questionnaire Score - 01/12/17 1520      Knowledge Questionnaire Score   Pre Score  9/10       Goals reviewed with patient; copy given to patient.

## 2017-05-21 NOTE — Progress Notes (Signed)
Pulmonary Individual Treatment Plan  Patient Details  Name: Erika Martinez MRN: [id number] Date of Birth: May 03, 1956 Referring Provider:     Pulmonary Rehab from 01/12/2017 in [facility] Cardiac and Pulmonary Rehab  Referring Provider  Steele Sizer MD      Initial Encounter Date:    Pulmonary Rehab from 01/12/2017 in [facility] Cardiac and Pulmonary Rehab  Date  01/12/17  Referring Provider  Steele Sizer MD      Visit Diagnosis: H/O lung transplant [facility])  Patient's Home Medications on Admission:  Current Outpatient Medications:  .  acetaminophen (TYLENOL) 500 MG tablet, Take 1 tablet by mouth every 4 (four) hours as needed., Disp: , Rfl:  .  Alum & Mag Hydroxide-Simeth (GI COCKTAIL) SUSP suspension, Take 30 mLs by mouth 2 (two) times daily as needed for indigestion. Shake well., Disp: 120 mL, Rfl: 0 .  aspirin 81 MG chewable tablet, Chew 1 tablet by mouth daily., Disp: , Rfl:  .  azaTHIOprine (IMURAN) 50 MG tablet, Take 1 tablet (50 mg total) by mouth daily., Disp: 60 tablet, Rfl: 0 .  Calcium Citrate-Vitamin D (CALCIUM CITRATE+D3 PETITES) 200-250 MG-UNIT TABS, Take 3 tablets by mouth 2 (two) times daily., Disp: , Rfl:  .  cholecalciferol (VITAMIN D) 1000 UNITS tablet, Take 2,000 Units by mouth daily., Disp: , Rfl:  .  fluconazole (DIFLUCAN) 200 MG tablet, TAKE 2 TABLETS (400 MG TOTAL) BY MOUTH ONCE DAILY., Disp: , Rfl: 3 .  fluticasone (FLONASE) 50 MCG/ACT nasal spray, PLACE 2 SPRAYS INTO BOTH NOSTRILS DAILY., Disp: 16 g, Rfl: 6 .  Immune Globulin, Human, 2.5 GM/25ML SOLN, Inject into the vein., Disp: , Rfl:  .  loratadine (CLARITIN) 10 MG tablet, TAKE 1 TABLET (10 MG TOTAL) BY MOUTH 2 (TWO) TIMES DAILY., Disp: 60 tablet, Rfl: 3 .  magnesium oxide (MAG-OX) 400 MG tablet, Take 1 tablet by mouth daily., Disp: , Rfl:  .  metoprolol tartrate (LOPRESSOR) 25 MG tablet, Take 0.5 tablets by mouth 2 (two) times daily., Disp: , Rfl: 11 .  Multiple Vitamin (MULTI-VITAMINS) TABS, Take 1 tablet by mouth  daily., Disp: , Rfl:  .  OLANZapine (ZYPREXA) 5 MG tablet, Take 1 tablet by mouth daily., Disp: , Rfl: 0 .  omeprazole (PRILOSEC) 20 MG capsule, TAKE ONE CAPSULE BY MOUTH IN THE MORNING FOR GERD, Disp: 30 capsule, Rfl: 0 .  ondansetron (ZOFRAN) 4 MG tablet, Take 1 tablet (4 mg total) by mouth every 8 (eight) hours as needed for nausea or vomiting., Disp: 30 tablet, Rfl: 0 .  oxyCODONE (OXY IR/ROXICODONE) 5 MG immediate release tablet, Take 1 tablet by mouth as needed., Disp: , Rfl: 0 .  predniSONE (DELTASONE) 5 MG tablet, Take 4 tablets by mouth 4 (four) times daily., Disp: , Rfl:  .  Skin Protectants, Misc. (EUCERIN) cream, Apply topically., Disp: , Rfl:  .  sulfamethoxazole-trimethoprim (BACTRIM DS,SEPTRA DS) 800-160 MG tablet, TAKE 1 TABLET BY MOUTH 3 (THREE) TIMES A WEEK., Disp: , Rfl: 5 .  tacrolimus (PROGRAF) 1 MG capsule, Take 1 capsule by mouth every 12 (twelve) hours., Disp: , Rfl:  .  valGANciclovir (VALCYTE) 450 MG tablet, Take 1 tablet by mouth daily., Disp: , Rfl:  .  VENTOLIN HFA 108 (90 Base) MCG/ACT inhaler, INHALE 2 PUFFS INTO THE LUNGS EVERY 4 (FOUR) HOURS AS NEEDED FOR WHEEZING OR SHORTNESS OF BREATH., Disp: 18 g, Rfl: 5 .  vitamin B-12 (CYANOCOBALAMIN) 250 MCG tablet, Place 250 mcg under the tongue every other day. , Disp: , Rfl:  Past Medical History: Past Medical History:  Diagnosis Date  . Abnormal hemoglobin (HCC)   . Allergy   . Anxiety   . Asthma   . Controlled insomnia   . Erythrocytosis   . GERD (gastroesophageal reflux disease)   . Hep C w/o coma, chronic ([place])   . Hypertension   . IBS (irritable bowel syndrome)   . Lumbago   . Mitral valve prolapse   . Oxygen dependent   . Plantar wart   . Pulmonary fibrosis ([place])   . Raynaud disease   . Scleroderma ([place])    Dr. Pennie Banter  . Seasonal allergies   . Spondylolisthesis   . Symptomatic menopausal or female climacteric states     Tobacco Use: Social History   Tobacco Use  Smoking Status Never Smoker   Smokeless Tobacco Never Used    Labs: Recent Review Scientist, physiological    Labs for ITP Cardiac and Pulmonary Rehab Latest Ref Rng & Units 07/14/2013 07/05/2015 08/02/2015   Cholestrol 100 - 199 mg/dL 169 - 196   LDLCALC 0 - 99 mg/dL 95 - 112(H)   HDL >39 mg/dL 58 - 68   Trlycerides 0 - 149 mg/dL 78 - 78   Hemoglobin A1c 4.8 - 5.6 % - 6.3(H) -       Pulmonary Assessment Scores: Pulmonary Assessment Scores    Row Name 01/12/17 1521         ADL UCSD   ADL Phase  Entry     SOB Score total  22     Rest  0     Walk  0     Stairs  2     Bath  1     Dress  0     Shop  1       CAT Score   CAT Score  14       mMRC Score   mMRC Score  2        Pulmonary Function Assessment: Pulmonary Function Assessment - 01/12/17 1654      Breath   Bilateral Breath Sounds  Clear;Decreased    Shortness of Breath  No       Exercise Target Goals:    Exercise Program Goal: Individual exercise prescription set with THRR, safety & activity barriers. Participant demonstrates ability to understand and report RPE using BORG scale, to self-measure pulse accurately, and to acknowledge the importance of the exercise prescription.  Exercise Prescription Goal: Starting with aerobic activity 30 plus minutes a day, 3 days per week for initial exercise prescription. Provide home exercise prescription and guidelines that participant acknowledges understanding prior to discharge.  Activity Barriers & Risk Stratification: Activity Barriers & Cardiac Risk Stratification - 01/12/17 1601      Activity Barriers & Cardiac Risk Stratification   Activity Barriers  Deconditioning;Muscular Weakness;Shortness of Breath;Assistive Device;Balance Concerns using rollator for support for walking       6 Minute Walk: 6 Minute Walk    Row Name 01/12/17 1558         6 Minute Walk   Phase  Initial     Distance  602 feet using rollator     Walk Time  5.8 minutes     # of Rest Breaks  2 4 sec, 8 sec     MPH  1.14      METS  2.42     RPE  13     Perceived Dyspnea   0     VO2 Peak  8.47     Symptoms  Yes (comment)     Comments  legs feel like jello     Resting HR  105 bpm     Resting BP  128/70     Max Ex. HR  121 bpm     Max Ex. BP  124/66     2 Minute Post BP  126/60       Interval HR   Baseline HR (retired)  105     1 Minute HR  117     2 Minute HR  118     3 Minute HR  121     4 Minute HR  119     5 Minute HR  119     6 Minute HR  118     2 Minute Post HR  110     Interval Heart Rate?  Yes       Interval Oxygen   Interval Oxygen?  Yes     Baseline Oxygen Saturation %  97 %     Resting Liters of Oxygen  0 L Room Air     1 Minute Oxygen Saturation %  100 %     1 Minute Liters of Oxygen  0 L     2 Minute Oxygen Saturation %  100 %     2 Minute Liters of Oxygen  0 L     3 Minute Oxygen Saturation %  100 %     3 Minute Liters of Oxygen  0 L     4 Minute Oxygen Saturation %  100 %     4 Minute Liters of Oxygen  0 L     5 Minute Oxygen Saturation %  100 %     5 Minute Liters of Oxygen  0 L     6 Minute Oxygen Saturation %  100 %     6 Minute Liters of Oxygen  0 L     2 Minute Post Oxygen Saturation %  100 %     2 Minute Post Liters of Oxygen  0 L       Oxygen Initial Assessment: Oxygen Initial Assessment - 01/12/17 1651      Home Oxygen   Home Oxygen Device  None    Sleep Oxygen Prescription  None    Home Exercise Oxygen Prescription  None    Home at Rest Exercise Oxygen Prescription  None      Intervention   Short Term Goals  To learn and understand importance of monitoring SPO2 with pulse oximeter and demonstrate accurate use of the pulse oximeter.;To Learn and understand importance of maintaining oxygen saturations>88%;To learn and demonstrate proper purse lipped breathing techniques or other breathing techniques.;To learn and demonstrate proper use of respiratory medications    Long  Term Goals  Verbalizes importance of monitoring SPO2 with pulse oximeter and return  demonstration;Exhibits proper breathing techniques, such as purse lipped breathing or other method taught during program session;Demonstrates proper use of MDI's;Compliance with respiratory medication;Maintenance of O2 saturations>88%       Oxygen Re-Evaluation: Oxygen Re-Evaluation    Row Name 01/16/17 1251 02/18/17 1216 02/25/17 1450         Program Oxygen Prescription   Program Oxygen Prescription  -  None  None       Home Oxygen   Home Oxygen Device  -  None  None     Sleep Oxygen Prescription  -  None  None  Home Exercise Oxygen Prescription  -  None  None     Home at Rest Exercise Oxygen Prescription  -  None  None     Compliance with Home Oxygen Use  -  Yes  Yes       Goals/Expected Outcomes   Short Term Goals  To learn and demonstrate proper purse lipped breathing techniques or other breathing techniques.  To learn and understand importance of monitoring SPO2 with pulse oximeter and demonstrate accurate use of the pulse oximeter.;To learn and understand importance of maintaining oxygen saturations>88%;To learn and demonstrate proper pursed lip breathing techniques or other breathing techniques.;To learn and demonstrate proper use of respiratory medications  To learn and understand importance of maintaining oxygen saturations>88%;To learn and understand importance of monitoring SPO2 with pulse oximeter and demonstrate accurate use of the pulse oximeter.;To learn and demonstrate proper pursed lip breathing techniques or other breathing techniques.     Long  Term Goals  Exhibits proper breathing techniques, such as purse lipped breathing or other method taught during program session  Maintenance of O2 saturations>88%;Compliance with respiratory medication;Verbalizes importance of monitoring SPO2 with pulse oximeter and return demonstration;Exhibits proper breathing techniques, such as pursed lip breathing or other method taught during program session;Demonstrates proper use of MDI's   Maintenance of O2 saturations>88%;Compliance with respiratory medication;Demonstrates proper use of MDI's;Exhibits proper breathing techniques, such as pursed lip breathing or other method taught during program session;Verbalizes importance of monitoring SPO2 with pulse oximeter and return demonstration     Comments  Reviewed PLB technique with pt.  Talked about how it work and it's important to maintaining his exercise saturations.   Maintaining oxygen saturations at home and monitors with pulse oximeter.  She does have some side effects from anti-rejection medications including prednisone.  She is doing well onother meds, occasionally she will still use her emergency inhaler during coughing spells and drainage from allergies.   Continues to use her pursed lip breathing and has noticed that is has become a habit for her.    Stela has checks her oxygen at home everyday and makes sure her oxygen saturations are above 88 percent. She does PLB well but could use more teaching. She has not been using oxygen at home. She knows how to use her albuterol inhaler but states she has not needed it.      Goals/Expected Outcomes  Short: Become more profiecient at using PLB.   Long: Become independent at using PLB.  Short: Continue monitor oxygen saturations at home Long: Continue to manage care post transplant and adjust to anit-rejection meds.  Short: work on PLB techniques. Long: Be proficient at PLB        Oxygen Discharge (Final Oxygen Re-Evaluation): Oxygen Re-Evaluation - 02/25/17 1450      Program Oxygen Prescription   Program Oxygen Prescription  None      Home Oxygen   Home Oxygen Device  None    Sleep Oxygen Prescription  None    Home Exercise Oxygen Prescription  None    Home at Rest Exercise Oxygen Prescription  None    Compliance with Home Oxygen Use  Yes      Goals/Expected Outcomes   Short Term Goals  To learn and understand importance of maintaining oxygen saturations>88%;To learn and  understand importance of monitoring SPO2 with pulse oximeter and demonstrate accurate use of the pulse oximeter.;To learn and demonstrate proper pursed lip breathing techniques or other breathing techniques.    Long  Term Goals  Maintenance  of O2 saturations>88%;Compliance with respiratory medication;Demonstrates proper use of MDI's;Exhibits proper breathing techniques, such as pursed lip breathing or other method taught during program session;Verbalizes importance of monitoring SPO2 with pulse oximeter and return demonstration    Comments  Zahniya has checks her oxygen at home everyday and makes sure her oxygen saturations are above 88 percent. She does PLB well but could use more teaching. She has not been using oxygen at home. She knows how to use her albuterol inhaler but states she has not needed it.     Goals/Expected Outcomes  Short: work on PLB techniques. Long: Be proficient at PLB       Initial Exercise Prescription: Initial Exercise Prescription - 01/12/17 1600      Date of Initial Exercise RX and Referring Provider   Date  01/12/17    Referring Provider  Steele Sizer MD      NuStep   Level  1    SPM  80    Minutes  15    METs  2      REL-XR   Level  1    Speed  50    Minutes  15    METs  2      Track   Laps  21    Minutes  15    METs  2      Prescription Details   Frequency (times per week)  3    Duration  Progress to 45 minutes of aerobic exercise without signs/symptoms of physical distress      Intensity   THRR 40-80% of Max Heartrate  126-147    Ratings of Perceived Exertion  11-13    Perceived Dyspnea  0-4      Progression   Progression  Continue to progress workloads to maintain intensity without signs/symptoms of physical distress.      Resistance Training   Training Prescription  Yes    Weight  3 lbs    Reps  10-15       Perform Capillary Blood Glucose checks as needed.  Exercise Prescription Changes: Exercise Prescription Changes    Row Name  01/12/17 1600 01/20/17 1600 02/04/17 1600 02/17/17 1400 02/18/17 1200     Response to Exercise   Blood Pressure (Admit)  128/70  122/78  110/78  119/64  -   Blood Pressure (Exercise)  124/66  -  -  -  -   Blood Pressure (Exit)  126/60  110/74  112/62  110/64  -   Heart Rate (Admit)  105 bpm  96 bpm  68 bpm  99 bpm  -   Heart Rate (Exercise)  121 bpm  101 bpm  78 bpm  75 bpm  -   Heart Rate (Exit)  110 bpm  83 bpm  86 bpm  75 bpm  -   Oxygen Saturation (Admit)  97 %  96 %  96 %  99 %  -   Oxygen Saturation (Exercise)  100 %  95 %  97 %  96 %  -   Oxygen Saturation (Exit)  100 %  99 %  99 %  100 %  -   Rating of Perceived Exertion (Exercise)  _0 -   Perceived Dyspnea (Exercise)  0  0  1  2  -   Symptoms  legs felt like jello  fatigue  fatigue  -  -   Comments  walk test results  first  full day of exercise  second day of exercise  third day of exercise  -   Duration  -  Progress to 45 minutes of aerobic exercise without signs/symptoms of physical distress  Progress to 45 minutes of aerobic exercise without signs/symptoms of physical distress  Continue with 45 min of aerobic exercise without signs/symptoms of physical distress.  -   Intensity  -  THRR unchanged  THRR unchanged  THRR unchanged  -     Progression   Progression  -  Continue to progress workloads to maintain intensity without signs/symptoms of physical distress.  Continue to progress workloads to maintain intensity without signs/symptoms of physical distress.  Continue to progress workloads to maintain intensity without signs/symptoms of physical distress.  -   Average METs  -  2.7  2.85  1.46  -     Resistance Training   Training Prescription  -  Yes  Yes  Yes  -   Weight  -  3 lbs  3 lbs  3 lbs  -   Reps  -  10-15  10-15  10-15  -     Interval Training   Interval Training  -  No  No  No  -     NuStep   Level  -  1  -  1  -   SPM  -  41  -  52  -   Minutes  -  15  -  15  -   METs  -  1.6  -  1.2  -      REL-XR   Level  -  _0 -   Speed  -  _1 -   Minutes  -  _2 -   METs  -  3.8  4.2  1.5  -     Track   Laps  -  -  11  15  -   Minutes  -  -  15  15  -   METs  -  -  1.5  1.7  -     Home Exercise Plan   Plans to continue exercise at  -  -  -  -  Home (comment) walking, recumbent bike, complex gym   Frequency  -  -  -  -  Add 1 additional day to program exercise sessions.   Initial Home Exercises Provided  -  -  -  -  02/18/17   Row Name 03/03/17 1500 03/17/17 1600           Response to Exercise   Blood Pressure (Admit)  130/70  92/54      Blood Pressure (Exit)  124/74  102/72      Heart Rate (Admit)  74 bpm  72 bpm      Heart Rate (Exercise)  96 bpm  78 bpm      Heart Rate (Exit)  70 bpm  118 bpm      Oxygen Saturation (Admit)  99 %  94 %      Oxygen Saturation (Exercise)  94 %  97 %      Oxygen Saturation (Exit)  99 %  94 %      Rating of Perceived Exertion (Exercise)  12  12      Perceived Dyspnea (Exercise)  1  1      Symptoms  fatigue  fatigue  Duration  Continue with 45 min of aerobic exercise without signs/symptoms of physical distress.  Continue with 45 min of aerobic exercise without signs/symptoms of physical distress.      Intensity  THRR unchanged  THRR unchanged        Progression   Progression  Continue to progress workloads to maintain intensity without signs/symptoms of physical distress.  Continue to progress workloads to maintain intensity without signs/symptoms of physical distress.      Average METs  1.25  1.55        Resistance Training   Training Prescription  Yes  Yes      Weight  3 lbs  3 lbs      Reps  10-15  10-15        Interval Training   Interval Training  No  No        NuStep   Level  1  2      SPM  77  63      Minutes  15  15      METs  1.4  1.4        REL-XR   Level  1  1      Speed  26  12      Minutes  15  15      METs  1.1  1        Track   Laps  - has not been walking  15      Minutes  -  15       METs  -  1.7        Home Exercise Plan   Plans to continue exercise at  Home (comment) walking, recumbent bike, complex gym  Home (comment) walking, recumbent bike, complex gym      Frequency  Add 1 additional day to program exercise sessions.  Add 1 additional day to program exercise sessions.      Initial Home Exercises Provided  02/18/17  02/18/17         Exercise Comments: Exercise Comments    Row Name 01/16/17 1150           Exercise Comments  First full day of exercise!  Patient was oriented to gym and equipment including functions, settings, policies, and procedures.  Patient's individual exercise prescription and treatment plan were reviewed.  All starting workloads were established based on the results of the 6 minute walk test done at initial orientation visit.  The plan for exercise progression was also introduced and progression will be customized based on patient's performance and goals.          Exercise Goals and Review: Exercise Goals    Row Name 01/12/17 1606             Exercise Goals   Increase Physical Activity  Yes       Intervention  Provide advice, education, support and counseling about physical activity/exercise needs.;Develop an individualized exercise prescription for aerobic and resistive training based on initial evaluation findings, risk stratification, comorbidities and participant's personal goals.       Expected Outcomes  Achievement of increased cardiorespiratory fitness and enhanced flexibility, muscular endurance and strength shown through measurements of functional capacity and personal statement of participant.       Increase Strength and Stamina  Yes       Intervention  Provide advice, education, support and counseling about physical activity/exercise needs.;Develop an individualized exercise prescription for aerobic and resistive training based on  initial evaluation findings, risk stratification, comorbidities and participant's personal goals.        Expected Outcomes  Achievement of increased cardiorespiratory fitness and enhanced flexibility, muscular endurance and strength shown through measurements of functional capacity and personal statement of participant.          Exercise Goals Re-Evaluation : Exercise Goals Re-Evaluation    Row Name 01/20/17 1655 02/04/17 1554 02/17/17 1418 02/18/17 1223 03/03/17 1515     Exercise Goal Re-Evaluation   Exercise Goals Review  Increase Physical Activity;Increase Strenth and Stamina  Increase Physical Activity;Increase Strength and Stamina  Increase Physical Activity;Increase Strength and Stamina  Increase Physical Activity;Increase Strength and Stamina;Understanding of Exercise Prescription;Knowledge and understanding of Target Heart Rate Range (THRR);Able to check pulse independently  Increase Physical Activity;Increase Strength and Stamina   Comments  Diane has completed her first full day of exercise.  She has also had some transportation issues and was not able to make it in for her next visit.  We will continue to work on her progression.   Diane has only attended one visit since last update. She continues to have transportation issues.  We will continue to work on her progression.  Diane returned yesterday after being out for almost 3 weeks.  She has not been feeling well but could tell she was just getting weaker sitting at home.  She was able to come back without any problems.  We will continue to monitor her progression.   Strength and stamina are starting to come back and are her biggest limitation for doing things around the house.  She is able to do more at home and paces herself.  Her other goal was to get rid of her rollator.  She is able to get around the house some without but she is afraid of falling.  Diane knows that she really needs to focus on buidling up her strength back up to help with this. Reviewed home exercise with pt today.  Pt plans to walk at [place] for exercise.   She is going to build up to being able to walk entire [place].  For the rest of the 30 min she is going to use her recumbent bike and possibly go to complex gym.  Reviewed THR, pulse, RPE, sign and symptoms, and when to call 911 or MD.  Also discussed weather considerations and indoor options.  Pt voiced understanding.  Diane has been doing well in rehab.  She needs lots of encouragement to walk.  She is up to 1.4 METs on the NuStep.  We will continue to monitor her progress.    Expected Outcomes  Short: Attend pulmonary rehab classes regularly.  Long: Work on Animator.   Short: Attend pulmonary rehab classes regularly.  Long: Work on Animator.   Short: Attend rehab regularly. Long: Build up strength and stamina.   Short: Start walking on [place] when not here (adding in at least one of home exercise) Long: Building up strength to be able walk without rollator.  Short: Encourage her to walk more here and at home.  Long: Continue to work on building up strength and stamina.    [place] Name 03/17/17 1614 04/02/17 1350 04/14/17 1344 04/27/17 1643       Exercise Goal Re-Evaluation   Exercise Goals Review  Increase Physical Activity;Increase Strength and Stamina  -  -  -    Comments  Diane continues to do well in rehab.  She is  back to walking more again.  She has moved up to level 2 on the NuStep.  We will continue to monitor her progression.   Diane has been out since last review.  Diane has been out since last review.  Diane has been out since last review.    Expected Outcomes  Short: Continue to encourage her walking. Long: Continue to build her strength and stamina.   -  -  -       Discharge Exercise Prescription (Final Exercise Prescription Changes): Exercise Prescription Changes - 03/17/17 1600      Response to Exercise   Blood Pressure (Admit)  92/54    Blood Pressure (Exit)  102/72    Heart Rate (Admit)  72 bpm    Heart Rate (Exercise)  78 bpm     Heart Rate (Exit)  118 bpm    Oxygen Saturation (Admit)  94 %    Oxygen Saturation (Exercise)  97 %    Oxygen Saturation (Exit)  94 %    Rating of Perceived Exertion (Exercise)  12    Perceived Dyspnea (Exercise)  1    Symptoms  fatigue    Duration  Continue with 45 min of aerobic exercise without signs/symptoms of physical distress.    Intensity  THRR unchanged      Progression   Progression  Continue to progress workloads to maintain intensity without signs/symptoms of physical distress.    Average METs  1.55      Resistance Training   Training Prescription  Yes    Weight  3 lbs    Reps  10-15      Interval Training   Interval Training  No      NuStep   Level  2    SPM  63    Minutes  15    METs  1.4      REL-XR   Level  1    Speed  12    Minutes  15    METs  1      Track   Laps  15    Minutes  15    METs  1.7      Home Exercise Plan   Plans to continue exercise at  Home (comment) walking, recumbent bike, complex gym    Frequency  Add 1 additional day to program exercise sessions.    Initial Home Exercises Provided  02/18/17       Nutrition:  Target Goals: Understanding of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

## 2017-05-21 NOTE — Telephone Encounter (Signed)
Erika Martinez has been out of [place] for almost 2 months. She explained she still has an infection and cannot return yet. Informed her if she would like to continue the program she would need another referral. Patient verbalized understanding.

## 2017-05-26 DIAGNOSIS — E876 Hypokalemia: Secondary | ICD-10-CM | POA: Insufficient documentation

## 2017-05-26 DIAGNOSIS — R1032 Left lower quadrant pain: Secondary | ICD-10-CM | POA: Diagnosis not present

## 2017-05-26 DIAGNOSIS — B370 Candidal stomatitis: Secondary | ICD-10-CM | POA: Diagnosis not present

## 2017-05-26 DIAGNOSIS — N179 Acute kidney failure, unspecified: Secondary | ICD-10-CM | POA: Diagnosis not present

## 2017-05-26 DIAGNOSIS — Z5181 Encounter for therapeutic drug level monitoring: Secondary | ICD-10-CM | POA: Diagnosis not present

## 2017-05-26 DIAGNOSIS — R140 Abdominal distension (gaseous): Secondary | ICD-10-CM | POA: Diagnosis not present

## 2017-05-26 DIAGNOSIS — I9589 Other hypotension: Secondary | ICD-10-CM | POA: Diagnosis not present

## 2017-05-26 DIAGNOSIS — R197 Diarrhea, unspecified: Secondary | ICD-10-CM | POA: Diagnosis not present

## 2017-05-26 DIAGNOSIS — D899 Disorder involving the immune mechanism, unspecified: Secondary | ICD-10-CM | POA: Diagnosis not present

## 2017-05-26 DIAGNOSIS — K529 Noninfective gastroenteritis and colitis, unspecified: Secondary | ICD-10-CM | POA: Diagnosis not present

## 2017-05-26 DIAGNOSIS — A0471 Enterocolitis due to Clostridium difficile, recurrent: Secondary | ICD-10-CM | POA: Diagnosis not present

## 2017-05-26 DIAGNOSIS — D72810 Lymphocytopenia: Secondary | ICD-10-CM | POA: Insufficient documentation

## 2017-06-03 DIAGNOSIS — Z942 Lung transplant status: Secondary | ICD-10-CM | POA: Diagnosis not present

## 2017-06-03 DIAGNOSIS — Z79899 Other long term (current) drug therapy: Secondary | ICD-10-CM | POA: Diagnosis not present

## 2017-06-10 DIAGNOSIS — B259 Cytomegaloviral disease, unspecified: Secondary | ICD-10-CM | POA: Diagnosis not present

## 2017-06-10 DIAGNOSIS — Z79899 Other long term (current) drug therapy: Secondary | ICD-10-CM | POA: Diagnosis not present

## 2017-06-10 DIAGNOSIS — Z942 Lung transplant status: Secondary | ICD-10-CM | POA: Diagnosis not present

## 2017-06-12 ENCOUNTER — Ambulatory Visit
Admission: RE | Admit: 2017-06-12 | Discharge: 2017-06-12 | Disposition: A | Discharge status: Home / Self Care | Payer: 59 | Source: Ambulatory Visit | Attending: Family Medicine | Admitting: Family Medicine

## 2017-06-12 ENCOUNTER — Encounter: Payer: Self-pay | Admitting: Family Medicine

## 2017-06-12 ENCOUNTER — Ambulatory Visit: Payer: 59 | Admitting: Family Medicine

## 2017-06-12 VITALS — BP 118/64 | HR 88 | Temp 97.7°F | Resp 18 | Ht 61.0 in | Wt 100.5 lb

## 2017-06-12 DIAGNOSIS — Z942 Lung transplant status: Secondary | ICD-10-CM | POA: Diagnosis not present

## 2017-06-12 DIAGNOSIS — E441 Mild protein-calorie malnutrition: Secondary | ICD-10-CM

## 2017-06-12 DIAGNOSIS — Z9889 Other specified postprocedural states: Secondary | ICD-10-CM | POA: Diagnosis not present

## 2017-06-12 DIAGNOSIS — F320 Major depressive disorder, single episode, mild: Secondary | ICD-10-CM | POA: Diagnosis not present

## 2017-06-12 DIAGNOSIS — R059 Cough, unspecified: Secondary | ICD-10-CM

## 2017-06-12 DIAGNOSIS — M349 Systemic sclerosis, unspecified: Secondary | ICD-10-CM | POA: Diagnosis not present

## 2017-06-12 DIAGNOSIS — D849 Immunodeficiency, unspecified: Secondary | ICD-10-CM

## 2017-06-12 DIAGNOSIS — D473 Essential (hemorrhagic) thrombocythemia: Secondary | ICD-10-CM | POA: Diagnosis not present

## 2017-06-12 DIAGNOSIS — I471 Supraventricular tachycardia: Secondary | ICD-10-CM | POA: Diagnosis not present

## 2017-06-12 DIAGNOSIS — R05 Cough: Secondary | ICD-10-CM

## 2017-06-12 DIAGNOSIS — Z8619 Personal history of other infectious and parasitic diseases: Secondary | ICD-10-CM

## 2017-06-12 DIAGNOSIS — D75839 Thrombocytosis, unspecified: Secondary | ICD-10-CM | POA: Insufficient documentation

## 2017-06-12 DIAGNOSIS — D899 Disorder involving the immune mechanism, unspecified: Secondary | ICD-10-CM | POA: Diagnosis not present

## 2017-06-12 MED ORDER — METOPROLOL TARTRATE 25 MG PO TABS: 12.5000 mg | ORAL_TABLET | Freq: Two times a day (BID) | ORAL | 0 refills | Status: AC

## 2017-06-12 MED ORDER — NYSTATIN 100000 UNIT/ML MT SUSP: 5.0000 mL | Freq: Four times a day (QID) | OROMUCOSAL | 0 refills | Status: AC

## 2017-06-12 MED ORDER — ESCITALOPRAM OXALATE 10 MG PO TABS: 10.0000 mg | ORAL_TABLET | Freq: Every day | ORAL | 0 refills | Status: DC

## 2017-06-12 NOTE — Progress Notes (Signed)
Name: Erika Martinez   MRN: [id number]    DOB: [birth year]   Date:06/12/2017       Progress Note  Subjective  Chief Complaint  Chief Complaint  Patient presents with  . Cough    Onset-3 to 4 days, productive cough-clear mucus, sore chest , cold symtoms such as ears popping, scratchy throat.   . Follow-up    HPI  Scleroderma/history of lung transplant: she had the transplant back in 08/2016 and is tired of feeling sick and going to the hospital 5 days since 08/2016.   Depression major: she is feeling down, she states she is tired of feeling sick, not working, getting isolated. Daughter works all day, because of immunosupression she cannot go out, she has joined a Youth worker - lung rehabilitation program. She does not want to see a psychiatrist. She is not sure if she wants medication but willing to try medication. She denies suicidal thoughts or ideation  SVT: doing well on Metoprolol , denies chest pain or palpitation  Protein malnutrition: labs recently done at [facility], she has lost another 7 lbs since her last visit with me. She states she is eating, however had c. difi colitis and still has mild diarrhea which is likely affecting absorption  Albumin slightly better at 3.3   History of lung transplant: she is breathing well, no longer needs oxygen therapy, still uses a walker because of weakness.   Patient Active Problem List   Diagnosis Date Noted  . Thrombocytosis ([place]) 06/12/2017  . Hypokalemia 05/26/2017  . Lymphocytopenia 05/26/2017  . H/O Clostridium difficile infection 04/21/2017  . Severe protein-calorie malnutrition ([place]) 03/22/2017  . Immunosuppressed status ([place]) 12/02/2016  . CKD (chronic kidney disease) stage 3, GFR 30-59 ml/min (HCC) 11/13/2016  . Immunocompromised ([place]) 08/28/2016  . History of lung transplant ([place]) 08/26/2016  . Raynaud disease 11/26/2015  . Decreased GFR 07/24/2015  . Mild major depression ([place]) 06/07/2015  . Hypertension, benign 04/06/2015   . History of hepatitis C 02/02/2015  . Insomnia 02/02/2015  . Gastro-esophageal reflux disease without esophagitis 02/02/2015  . IBS (irritable bowel syndrome) 02/02/2015  . Low serum cobalamin 02/02/2015  . Perennial allergic rhinitis with seasonal variation 02/02/2015  . Scleroderma ([place]) 02/02/2015  . SPL (spondylolisthesis) 02/02/2015  . Supraventricular tachycardia ([place]) 02/02/2015  . Vitamin D deficiency 02/02/2015  . Anxiety 02/02/2015  . History of pneumococcal pneumonia 09/13/2013  . Polycythemia, secondary 09/13/2013    Past Surgical History:  Procedure Laterality Date  . ABDOMINAL HYSTERECTOMY    . LUNG TRANSPLANT, DOUBLE    . VAGINAL HYSTERECTOMY      Family History  Problem Relation Age of Onset  . Lung cancer Father   . Cancer Father   . Alzheimer's disease Mother   . Breast cancer Neg Hx     Social History   Socioeconomic History  . Marital status: Divorced    Spouse name: Not on file  . Number of children: Not on file  . Years of education: Not on file  . Highest education level: Not on file  Social Needs  . Financial resource strain: Not on file  . Food insecurity - worry: Not on file  . Food insecurity - inability: Not on file  . Transportation needs - medical: Not on file  . Transportation needs - non-medical: Not on file  Occupational History  . Not on file  Tobacco Use  . Smoking status: Never Smoker  . Smokeless tobacco: Never Used  Substance and Sexual  Activity  . Alcohol use: Yes    Alcohol/week: 2.0 oz    Types: 4 Standard drinks or equivalent per week    Comment: "social drinker"  . Drug use: No  . Sexual activity: Not on file  Other Topics Concern  . Not on file  Social History Narrative  . Not on file     Current Outpatient Medications:  .  acetaminophen (TYLENOL) 500 MG tablet, Take 1 tablet by mouth every 4 (four) hours as needed., Disp: , Rfl:  .  aspirin 81 MG chewable tablet, Chew 1 tablet by mouth daily., Disp: ,  Rfl:  .  azaTHIOprine (IMURAN) 50 MG tablet, Take 1 tablet (50 mg total) by mouth daily., Disp: 60 tablet, Rfl: 0 .  calcium elemental as carbonate (TUMS ULTRA 1000) 400 MG chewable tablet, Chew by mouth., Disp: , Rfl:  .  cholecalciferol (VITAMIN D) 1000 UNITS tablet, Take 2,000 Units by mouth daily., Disp: , Rfl:  .  clotrimazole (MYCELEX) 10 MG troche, DISSOLVE 1 TABLET BY MOUTH 5 TIMES DAILY DISSOLVE SLOWLY AND COMPLETELY IN MOUTH. END DATE 06/10/17, Disp: , Rfl: 0 .  DEXILANT 30 MG capsule, TAKE 60MG DAILY FOR 8 WEEKS, THEN DECREASE DOSE TO 30MG DAILY *INSURANCE ONLY PAYS FOR 1 DAILY, Disp: , Rfl: 11 .  fluticasone (FLONASE) 50 MCG/ACT nasal spray, PLACE 2 SPRAYS INTO BOTH NOSTRILS DAILY., Disp: 16 g, Rfl: 6 .  magnesium oxide (MAG-OX) 400 MG tablet, Take 1 tablet by mouth daily., Disp: , Rfl:  .  metoprolol tartrate (LOPRESSOR) 25 MG tablet, Take 0.5 tablets (12.5 mg total) by mouth 2 (two) times daily., Disp: 30 tablet, Rfl: 0 .  Multiple Vitamin (MULTI-VITAMINS) TABS, Take 1 tablet by mouth daily., Disp: , Rfl:  .  ondansetron (ZOFRAN) 4 MG tablet, Take 1 tablet (4 mg total) by mouth every 8 (eight) hours as needed for nausea or vomiting., Disp: 30 tablet, Rfl: 0 .  predniSONE (DELTASONE) 5 MG tablet, Take 10 mg by mouth daily with breakfast. , Disp: , Rfl:  .  ranitidine (ZANTAC) 150 MG tablet, TAKE 1 TABLET BY MOUTH TWICE A DAY *TABS PER INS*, Disp: , Rfl: 11 .  Skin Protectants, Misc. (EUCERIN) cream, Apply topically., Disp: , Rfl:  .  sulfamethoxazole-trimethoprim (BACTRIM DS,SEPTRA DS) 800-160 MG tablet, TAKE 1 TABLET BY MOUTH 3 (THREE) TIMES A WEEK., Disp: , Rfl: 5 .  tacrolimus (PROGRAF) 1 MG capsule, Take 1 capsule by mouth every 12 (twelve) hours. 1.5 in the pm, Disp: , Rfl:  .  valGANciclovir (VALCYTE) 450 MG tablet, Take 1 tablet by mouth daily., Disp: , Rfl:  .  VENTOLIN HFA 108 (90 Base) MCG/ACT inhaler, INHALE 2 PUFFS INTO THE LUNGS EVERY 4 (FOUR) HOURS AS NEEDED FOR WHEEZING OR  SHORTNESS OF BREATH., Disp: 18 g, Rfl: 5 .  vitamin B-12 (CYANOCOBALAMIN) 250 MCG tablet, Place 250 mcg under the tongue every other day. , Disp: , Rfl:  .  Calcium Citrate-Vitamin D (CALCIUM CITRATE+D3 PETITES) 200-250 MG-UNIT TABS, Take 3 tablets by mouth 2 (two) times daily., Disp: , Rfl:  .  escitalopram (LEXAPRO) 10 MG tablet, Take 1 tablet (10 mg total) by mouth daily., Disp: 30 tablet, Rfl: 0 .  loratadine (CLARITIN) 10 MG tablet, TAKE 1 TABLET (10 MG TOTAL) BY MOUTH 2 (TWO) TIMES DAILY. (Patient not taking: Reported on 06/12/2017), Disp: 60 tablet, Rfl: 3 .  nystatin (MYCOSTATIN) 100000 UNIT/ML suspension, Take 5 mLs (500,000 Units total) by mouth 4 (four) times daily., Disp:  60 mL, Rfl: 0  Allergies  Allergen Reactions  . Nsaids Other (See Comments)    Unable to receive NSAIDs as pt is s/p lung transplant     ROS  Constitutional: Negative for fever, positive for  weight change.  Respiratory: Positive  for cough but no  shortness of breath.   Cardiovascular: Negative for chest pain or palpitations.  Gastrointestinal: Negative for abdominal pain, no bowel changes.  Musculoskeletal: Positive  for gait problem no  joint swelling.  Skin: Negative for rash.  Neurological: Negative for dizziness or headache.  No other specific complaints in a complete review of systems (except as listed in HPI above).  Objective  Vitals:   06/12/17 1056  BP: 118/64  Pulse: 88  Resp: 18  Temp: 97.7 F (36.5 C)  TempSrc: Oral  SpO2: 99%  Weight: 100 lb 8 oz (45.6 kg)  Height: '5\' 1"'  (1.549 m)    Body mass index is 18.99 kg/m.  Physical Exam  Constitutional: Patient appears well-developed and well-nourished. No distress.  HEENT: head atraumatic, normocephalic, pupils equal and reactive to light, neck supple, throat within normal limits, poor dentition  Cardiovascular: Normal rate, regular rhythm and normal heart sounds.  No murmur heard. No BLE edema. Pulmonary/Chest: Effort normal ,  scattered end inspiratory wheezing. No respiratory distress. Abdominal: Soft.  There is no tenderness. Psychiatric: Patient has a normal mood and affect. behavior is normal. Judgment and thought content normal.   Recent Results (from the past 2160 hour(s))  Comprehensive metabolic panel     Status: Abnormal   Collection Time: 04/20/17  5:00 PM  Result Value Ref Range   Sodium 133 (L) 135 - 145 mmol/L   Potassium 4.1 3.5 - 5.1 mmol/L   Chloride 100 (L) 101 - 111 mmol/L   CO2 21 (L) 22 - 32 mmol/L   Glucose, Bld 136 (H) 65 - 99 mg/dL   BUN 26 (H) 6 - 20 mg/dL   Creatinine, Ser 1.24 (H) 0.44 - 1.00 mg/dL   Calcium 8.8 (L) 8.9 - 10.3 mg/dL   Total Protein 6.7 6.5 - 8.1 g/dL   Albumin 3.6 3.5 - 5.0 g/dL   AST 25 15 - 41 U/L   ALT 18 14 - 54 U/L   Alkaline Phosphatase 51 38 - 126 U/L   Total Bilirubin 0.8 0.3 - 1.2 mg/dL   GFR calc non Af Amer 45 (L) >60 mL/min   GFR calc Af Amer 52 (L) >60 mL/min    Comment: (NOTE) The eGFR has been calculated using the CKD EPI equation. This calculation has not been validated in all clinical situations. eGFR's persistently <60 mL/min signify possible Chronic Kidney Disease.    Anion gap 12 5 - 15  Lactic acid, plasma     Status: None   Collection Time: 04/20/17  5:00 PM  Result Value Ref Range   Lactic Acid, Venous 1.5 0.5 - 1.9 mmol/L  CBC with Differential     Status: Abnormal   Collection Time: 04/20/17  5:00 PM  Result Value Ref Range   WBC 7.1 3.6 - 11.0 K/uL   RBC 3.82 3.80 - 5.20 MIL/uL   Hemoglobin 11.7 (L) 12.0 - 16.0 g/dL   HCT 35.3 35.0 - 47.0 %   MCV 92.5 80.0 - 100.0 fL   MCH 30.7 26.0 - 34.0 pg   MCHC 33.1 32.0 - 36.0 g/dL   RDW 17.7 (H) 11.5 - 14.5 %   Platelets 229 150 - 440 K/uL  Neutrophils Relative % 69 %   Lymphocytes Relative 12 %   Monocytes Relative 17 %   Eosinophils Relative 0 %   Basophils Relative 0 %   Band Neutrophils 0 %   Metamyelocytes Relative 1 %   Myelocytes 1 %   Promyelocytes Absolute 0 %    Blasts 0 %   nRBC 0 0 /100 WBC   Other 0 %   Neutro Abs 5.0 1.4 - 6.5 K/uL   Lymphs Abs 0.9 (L) 1.0 - 3.6 K/uL   Monocytes Absolute 1.2 (H) 0.2 - 0.9 K/uL   Eosinophils Absolute 0.0 0 - 0.7 K/uL   Basophils Absolute 0.0 0 - 0.1 K/uL   Smear Review MORPHOLOGY UNREMARKABLE   Protime-INR     Status: None   Collection Time: 04/20/17  5:00 PM  Result Value Ref Range   Prothrombin Time 13.3 11.4 - 15.2 seconds   INR 1.02   Culture, blood (Routine x 2)     Status: None   Collection Time: 04/20/17  5:00 PM  Result Value Ref Range   Specimen Description BLOOD RIGHT ARM    Special Requests      BOTTLES DRAWN AEROBIC AND ANAEROBIC Blood Culture results may not be optimal due to an excessive volume of blood received in culture bottles   Culture NO GROWTH 5 DAYS    Report Status 04/25/2017 FINAL   Culture, blood (Routine x 2)     Status: Abnormal   Collection Time: 04/20/17  5:18 PM  Result Value Ref Range   Specimen Description BLOOD LEFT HAND    Special Requests      BOTTLES DRAWN AEROBIC AND ANAEROBIC Blood Culture adequate volume   Culture  Setup Time      GRAM POSITIVE COCCI AEROBIC BOTTLE ONLY CRITICAL RESULT CALLED TO, READ BACK BY AND VERIFIED WITH: [place] AT 1905 04/22/2017 BY TFK    Culture (A)     STAPHYLOCOCCUS SPECIES (COAGULASE NEGATIVE) THE SIGNIFICANCE OF ISOLATING THIS ORGANISM FROM A SINGLE SET OF BLOOD CULTURES WHEN MULTIPLE SETS ARE DRAWN IS UNCERTAIN. PLEASE NOTIFY THE MICROBIOLOGY DEPARTMENT WITHIN ONE WEEK IF SPECIATION AND SENSITIVITIES ARE REQUIRED. Performed at [facility], [place] [address], [place], [place] [address]    Report Status 04/25/2017 FINAL   Blood Culture ID Panel (Reflexed)     Status: None   Collection Time: 04/20/17  5:18 PM  Result Value Ref Range   Enterococcus species NOT DETECTED NOT DETECTED   Listeria monocytogenes NOT DETECTED NOT DETECTED   Staphylococcus species NOT DETECTED NOT DETECTED   Staphylococcus aureus NOT DETECTED  NOT DETECTED   Streptococcus species NOT DETECTED NOT DETECTED   Streptococcus agalactiae NOT DETECTED NOT DETECTED   Streptococcus pneumoniae NOT DETECTED NOT DETECTED   Streptococcus pyogenes NOT DETECTED NOT DETECTED   Acinetobacter baumannii NOT DETECTED NOT DETECTED   Enterobacteriaceae species NOT DETECTED NOT DETECTED   Enterobacter cloacae complex NOT DETECTED NOT DETECTED   Escherichia coli NOT DETECTED NOT DETECTED   Klebsiella oxytoca NOT DETECTED NOT DETECTED   Klebsiella pneumoniae NOT DETECTED NOT DETECTED   Proteus species NOT DETECTED NOT DETECTED   Serratia marcescens NOT DETECTED NOT DETECTED   Haemophilus influenzae NOT DETECTED NOT DETECTED   Neisseria meningitidis NOT DETECTED NOT DETECTED   Pseudomonas aeruginosa NOT DETECTED NOT DETECTED   Candida albicans NOT DETECTED NOT DETECTED   Candida glabrata NOT DETECTED NOT DETECTED   Candida krusei NOT DETECTED NOT DETECTED   Candida parapsilosis NOT  DETECTED NOT DETECTED   Candida tropicalis NOT DETECTED NOT DETECTED  Influenza panel by PCR (type A & B)     Status: None   Collection Time: 04/20/17  6:50 PM  Result Value Ref Range   Influenza A By PCR NEGATIVE NEGATIVE   Influenza B By PCR NEGATIVE NEGATIVE    Comment: (NOTE) The Xpert Xpress Flu assay is intended as an aid in the diagnosis of  influenza and should not be used as a sole basis for treatment.  This  assay is FDA approved for nasopharyngeal swab specimens only. Nasal  washings and aspirates are unacceptable for Xpert Xpress Flu testing.   Urinalysis, Complete w Microscopic     Status: Abnormal   Collection Time: 04/20/17  8:37 PM  Result Value Ref Range   Color, Urine STRAW (A) YELLOW   APPearance CLEAR (A) CLEAR   Specific Gravity, Urine 1.010 1.005 - 1.030   pH 6.0 5.0 - 8.0   Glucose, UA NEGATIVE NEGATIVE mg/dL   Hgb urine dipstick NEGATIVE NEGATIVE   Bilirubin Urine NEGATIVE NEGATIVE   Ketones, ur NEGATIVE NEGATIVE mg/dL   Protein, ur  NEGATIVE NEGATIVE mg/dL   Nitrite NEGATIVE NEGATIVE   Leukocytes, UA NEGATIVE NEGATIVE   RBC / HPF 0-5 0 - 5 RBC/hpf   WBC, UA 0-5 0 - 5 WBC/hpf   Bacteria, UA NONE SEEN NONE SEEN   Squamous Epithelial / LPF 0-5 (A) NONE SEEN   Mucus PRESENT   Urine culture     Status: Abnormal   Collection Time: 04/20/17  8:37 PM  Result Value Ref Range   Specimen Description URINE, RANDOM    Special Requests NONE    Culture MULTIPLE SPECIES PRESENT, SUGGEST RECOLLECTION (A)    Report Status 04/22/2017 FINAL     PHQ2/9: Depression screen [facility] 2/9 06/12/2017 06/12/2017 01/12/2017 12/02/2016 06/27/2016  Decreased Interest 1 0 0 0 0  Down, Depressed, Hopeless 1 0 1 0 0  PHQ - 2 Score 2 0 1 0 0  Altered sleeping 1 - 0 - -  Tired, decreased energy 3 - 2 - -  Change in appetite 1 - 1 - -  Feeling bad or failure about yourself  2 - 0 - -  Trouble concentrating 0 - 0 - -  Moving slowly or fidgety/restless 0 - 0 - -  Suicidal thoughts 0 - 0 - -  PHQ-9 Score 9 - 4 - -  Difficult doing work/chores Somewhat difficult - Not difficult at all - -  Some recent data might be hidden     Fall Risk: Fall Risk  06/12/2017 01/12/2017 12/02/2016 06/27/2016 03/26/2016  Falls in the past year? No No No No No     Functional Status Survey: Is the patient deaf or have difficulty hearing?: No Does the patient have difficulty seeing, even when wearing glasses/contacts?: No Does the patient have difficulty concentrating, remembering, or making decisions?: No Does the patient have difficulty walking or climbing stairs?: Yes Does the patient have difficulty dressing or bathing?: No Does the patient have difficulty doing errands alone such as visiting a doctor's office or shopping?: Yes   Assessment & Plan  1. Scleroderma ([place])  Still goes to [facility]   2. Mild major depression (HCC)  - escitalopram (LEXAPRO) 10 MG tablet; Take 1 tablet (10 mg total) by mouth daily.  Dispense: 30 tablet; Refill: 0  3. Supraventricular  tachycardia (HCC)  - metoprolol tartrate (LOPRESSOR) 25 MG tablet; Take 0.5 tablets (12.5 mg total)  by mouth 2 (two) times daily.  Dispense: 30 tablet; Refill: 0  4. Thrombocytosis ([place])  Recent labs done at [facility] and will go back in a few days  5. Immunosuppressed status ([place])  On multiple immunosuppressed  6. Mild protein malnutrition (HCC)  Continue to try to eat a high protein diet  7. History of lung transplant ([place])  - DG Chest 2 View; Future  8. Cough  - DG Chest 2 View; Future  9. History of oral candidiasis  - nystatin (MYCOSTATIN) 100000 UNIT/ML suspension; Take 5 mLs (500,000 Units total) by mouth 4 (four) times daily.  Dispense: 60 mL; Refill: 0

## 2017-06-15 DIAGNOSIS — E43 Unspecified severe protein-calorie malnutrition: Secondary | ICD-10-CM | POA: Diagnosis not present

## 2017-06-15 DIAGNOSIS — B259 Cytomegaloviral disease, unspecified: Secondary | ICD-10-CM | POA: Diagnosis not present

## 2017-06-15 DIAGNOSIS — N179 Acute kidney failure, unspecified: Secondary | ICD-10-CM | POA: Diagnosis not present

## 2017-06-15 DIAGNOSIS — E860 Dehydration: Secondary | ICD-10-CM | POA: Diagnosis not present

## 2017-06-15 DIAGNOSIS — J069 Acute upper respiratory infection, unspecified: Secondary | ICD-10-CM | POA: Diagnosis not present

## 2017-06-15 DIAGNOSIS — Z942 Lung transplant status: Secondary | ICD-10-CM | POA: Diagnosis not present

## 2017-06-15 DIAGNOSIS — R000 Tachycardia, unspecified: Secondary | ICD-10-CM | POA: Diagnosis not present

## 2017-06-15 DIAGNOSIS — T86818 Other complications of lung transplant: Secondary | ICD-10-CM | POA: Diagnosis not present

## 2017-06-15 DIAGNOSIS — A0472 Enterocolitis due to Clostridium difficile, not specified as recurrent: Secondary | ICD-10-CM | POA: Diagnosis not present

## 2017-06-15 DIAGNOSIS — R0602 Shortness of breath: Secondary | ICD-10-CM | POA: Diagnosis not present

## 2017-06-15 DIAGNOSIS — R062 Wheezing: Secondary | ICD-10-CM | POA: Diagnosis not present

## 2017-06-15 DIAGNOSIS — R918 Other nonspecific abnormal finding of lung field: Secondary | ICD-10-CM | POA: Diagnosis not present

## 2017-06-23 DIAGNOSIS — Z942 Lung transplant status: Secondary | ICD-10-CM | POA: Diagnosis not present

## 2017-06-23 DIAGNOSIS — Z79899 Other long term (current) drug therapy: Secondary | ICD-10-CM | POA: Diagnosis not present

## 2017-06-30 DIAGNOSIS — A0472 Enterocolitis due to Clostridium difficile, not specified as recurrent: Secondary | ICD-10-CM | POA: Diagnosis not present

## 2017-06-30 DIAGNOSIS — Z942 Lung transplant status: Secondary | ICD-10-CM | POA: Diagnosis not present

## 2017-06-30 DIAGNOSIS — D849 Immunodeficiency, unspecified: Secondary | ICD-10-CM | POA: Diagnosis not present

## 2017-06-30 DIAGNOSIS — Z79899 Other long term (current) drug therapy: Secondary | ICD-10-CM | POA: Diagnosis not present

## 2017-07-07 DIAGNOSIS — Z942 Lung transplant status: Secondary | ICD-10-CM | POA: Diagnosis not present

## 2017-07-07 DIAGNOSIS — Z79899 Other long term (current) drug therapy: Secondary | ICD-10-CM | POA: Diagnosis not present

## 2017-07-11 ENCOUNTER — Other Ambulatory Visit: Payer: Self-pay | Admitting: Family Medicine

## 2017-07-11 DIAGNOSIS — F320 Major depressive disorder, single episode, mild: Secondary | ICD-10-CM

## 2017-07-14 DIAGNOSIS — Z79899 Other long term (current) drug therapy: Secondary | ICD-10-CM | POA: Diagnosis not present

## 2017-07-14 DIAGNOSIS — A0471 Enterocolitis due to Clostridium difficile, recurrent: Secondary | ICD-10-CM | POA: Diagnosis not present

## 2017-07-14 DIAGNOSIS — Z942 Lung transplant status: Secondary | ICD-10-CM | POA: Diagnosis not present

## 2017-07-22 DIAGNOSIS — Z79899 Other long term (current) drug therapy: Secondary | ICD-10-CM | POA: Diagnosis not present

## 2017-07-22 DIAGNOSIS — Z942 Lung transplant status: Secondary | ICD-10-CM | POA: Diagnosis not present

## 2017-07-22 DIAGNOSIS — B259 Cytomegaloviral disease, unspecified: Secondary | ICD-10-CM | POA: Diagnosis not present

## 2017-07-28 DIAGNOSIS — M349 Systemic sclerosis, unspecified: Secondary | ICD-10-CM | POA: Diagnosis not present

## 2017-07-28 DIAGNOSIS — B259 Cytomegaloviral disease, unspecified: Secondary | ICD-10-CM | POA: Diagnosis not present

## 2017-07-28 DIAGNOSIS — J069 Acute upper respiratory infection, unspecified: Secondary | ICD-10-CM | POA: Diagnosis not present

## 2017-07-28 DIAGNOSIS — R918 Other nonspecific abnormal finding of lung field: Secondary | ICD-10-CM | POA: Diagnosis not present

## 2017-07-28 DIAGNOSIS — K219 Gastro-esophageal reflux disease without esophagitis: Secondary | ICD-10-CM | POA: Diagnosis not present

## 2017-07-28 DIAGNOSIS — J181 Lobar pneumonia, unspecified organism: Secondary | ICD-10-CM | POA: Diagnosis not present

## 2017-07-28 DIAGNOSIS — Z79899 Other long term (current) drug therapy: Secondary | ICD-10-CM | POA: Diagnosis not present

## 2017-07-28 DIAGNOSIS — Z942 Lung transplant status: Secondary | ICD-10-CM | POA: Diagnosis not present

## 2017-07-28 DIAGNOSIS — Z4824 Encounter for aftercare following lung transplant: Secondary | ICD-10-CM | POA: Diagnosis not present

## 2017-07-28 DIAGNOSIS — A0471 Enterocolitis due to Clostridium difficile, recurrent: Secondary | ICD-10-CM | POA: Diagnosis not present

## 2017-07-28 DIAGNOSIS — A0472 Enterocolitis due to Clostridium difficile, not specified as recurrent: Secondary | ICD-10-CM | POA: Diagnosis not present

## 2017-08-04 DIAGNOSIS — N183 Chronic kidney disease, stage 3 (moderate): Secondary | ICD-10-CM | POA: Diagnosis not present

## 2017-08-04 DIAGNOSIS — Z79899 Other long term (current) drug therapy: Secondary | ICD-10-CM | POA: Diagnosis not present

## 2017-08-04 DIAGNOSIS — T86819 Unspecified complication of lung transplant: Secondary | ICD-10-CM | POA: Diagnosis not present

## 2017-08-04 DIAGNOSIS — Z942 Lung transplant status: Secondary | ICD-10-CM | POA: Diagnosis not present

## 2017-08-04 DIAGNOSIS — D899 Disorder involving the immune mechanism, unspecified: Secondary | ICD-10-CM | POA: Diagnosis not present

## 2017-08-04 DIAGNOSIS — J90 Pleural effusion, not elsewhere classified: Secondary | ICD-10-CM | POA: Diagnosis not present

## 2017-08-04 DIAGNOSIS — R942 Abnormal results of pulmonary function studies: Secondary | ICD-10-CM | POA: Diagnosis not present

## 2017-08-04 DIAGNOSIS — Z4824 Encounter for aftercare following lung transplant: Secondary | ICD-10-CM | POA: Diagnosis not present

## 2017-08-04 DIAGNOSIS — R0781 Pleurodynia: Secondary | ICD-10-CM | POA: Diagnosis not present

## 2017-08-04 DIAGNOSIS — I10 Essential (primary) hypertension: Secondary | ICD-10-CM | POA: Diagnosis not present

## 2017-08-04 DIAGNOSIS — R0600 Dyspnea, unspecified: Secondary | ICD-10-CM | POA: Diagnosis not present

## 2017-08-04 DIAGNOSIS — R918 Other nonspecific abnormal finding of lung field: Secondary | ICD-10-CM | POA: Diagnosis not present

## 2017-08-04 DIAGNOSIS — R9389 Abnormal findings on diagnostic imaging of other specified body structures: Secondary | ICD-10-CM | POA: Diagnosis not present

## 2017-08-04 DIAGNOSIS — T86810 Lung transplant rejection: Secondary | ICD-10-CM | POA: Diagnosis not present

## 2017-08-04 DIAGNOSIS — J811 Chronic pulmonary edema: Secondary | ICD-10-CM | POA: Diagnosis not present

## 2017-08-04 DIAGNOSIS — E440 Moderate protein-calorie malnutrition: Secondary | ICD-10-CM | POA: Diagnosis not present

## 2017-08-04 DIAGNOSIS — K224 Dyskinesia of esophagus: Secondary | ICD-10-CM | POA: Diagnosis not present

## 2017-08-04 DIAGNOSIS — T86818 Other complications of lung transplant: Secondary | ICD-10-CM | POA: Diagnosis not present

## 2017-08-19 DIAGNOSIS — Z79899 Other long term (current) drug therapy: Secondary | ICD-10-CM | POA: Diagnosis not present

## 2017-08-19 DIAGNOSIS — Z942 Lung transplant status: Secondary | ICD-10-CM | POA: Diagnosis not present

## 2017-08-21 ENCOUNTER — Telehealth: Payer: Self-pay | Admitting: Family Medicine

## 2017-08-21 DIAGNOSIS — J3089 Other allergic rhinitis: Principal | ICD-10-CM

## 2017-08-21 DIAGNOSIS — J302 Other seasonal allergic rhinitis: Secondary | ICD-10-CM

## 2017-08-21 DIAGNOSIS — F320 Major depressive disorder, single episode, mild: Secondary | ICD-10-CM

## 2017-08-21 NOTE — Telephone Encounter (Signed)
Spoke with patient and she stated that she does not need the Lexapro. She did schedule an appointment for April

## 2017-08-21 NOTE — Telephone Encounter (Signed)
She needs follow up

## 2017-08-25 DIAGNOSIS — Z4824 Encounter for aftercare following lung transplant: Secondary | ICD-10-CM | POA: Diagnosis not present

## 2017-08-25 DIAGNOSIS — T86819 Unspecified complication of lung transplant: Secondary | ICD-10-CM | POA: Diagnosis not present

## 2017-08-25 DIAGNOSIS — Z79899 Other long term (current) drug therapy: Secondary | ICD-10-CM | POA: Diagnosis not present

## 2017-08-25 DIAGNOSIS — I129 Hypertensive chronic kidney disease with stage 1 through stage 4 chronic kidney disease, or unspecified chronic kidney disease: Secondary | ICD-10-CM | POA: Diagnosis not present

## 2017-08-25 DIAGNOSIS — D899 Disorder involving the immune mechanism, unspecified: Secondary | ICD-10-CM | POA: Diagnosis not present

## 2017-08-25 DIAGNOSIS — Z942 Lung transplant status: Secondary | ICD-10-CM | POA: Diagnosis not present

## 2017-08-25 DIAGNOSIS — A0471 Enterocolitis due to Clostridium difficile, recurrent: Secondary | ICD-10-CM | POA: Diagnosis not present

## 2017-08-25 DIAGNOSIS — J984 Other disorders of lung: Secondary | ICD-10-CM | POA: Diagnosis not present

## 2017-09-01 DIAGNOSIS — B259 Cytomegaloviral disease, unspecified: Secondary | ICD-10-CM | POA: Diagnosis not present

## 2017-09-01 DIAGNOSIS — Z942 Lung transplant status: Secondary | ICD-10-CM | POA: Diagnosis not present

## 2017-09-01 DIAGNOSIS — Z79899 Other long term (current) drug therapy: Secondary | ICD-10-CM | POA: Diagnosis not present

## 2017-09-08 DIAGNOSIS — Z5181 Encounter for therapeutic drug level monitoring: Secondary | ICD-10-CM | POA: Diagnosis not present

## 2017-09-08 DIAGNOSIS — Z4824 Encounter for aftercare following lung transplant: Secondary | ICD-10-CM | POA: Diagnosis not present

## 2017-09-08 DIAGNOSIS — A0471 Enterocolitis due to Clostridium difficile, recurrent: Secondary | ICD-10-CM | POA: Diagnosis not present

## 2017-09-08 DIAGNOSIS — Z942 Lung transplant status: Secondary | ICD-10-CM | POA: Diagnosis not present

## 2017-09-08 DIAGNOSIS — T86810 Lung transplant rejection: Secondary | ICD-10-CM | POA: Diagnosis not present

## 2017-09-08 DIAGNOSIS — I129 Hypertensive chronic kidney disease with stage 1 through stage 4 chronic kidney disease, or unspecified chronic kidney disease: Secondary | ICD-10-CM | POA: Diagnosis not present

## 2017-09-08 DIAGNOSIS — J189 Pneumonia, unspecified organism: Secondary | ICD-10-CM | POA: Diagnosis not present

## 2017-09-08 DIAGNOSIS — R918 Other nonspecific abnormal finding of lung field: Secondary | ICD-10-CM | POA: Diagnosis not present

## 2017-09-08 DIAGNOSIS — J90 Pleural effusion, not elsewhere classified: Secondary | ICD-10-CM | POA: Diagnosis not present

## 2017-09-08 DIAGNOSIS — Z79899 Other long term (current) drug therapy: Secondary | ICD-10-CM | POA: Diagnosis not present

## 2017-09-08 DIAGNOSIS — D899 Disorder involving the immune mechanism, unspecified: Secondary | ICD-10-CM | POA: Diagnosis not present

## 2017-09-15 ENCOUNTER — Other Ambulatory Visit: Payer: Self-pay | Admitting: Family Medicine

## 2017-09-15 DIAGNOSIS — B259 Cytomegaloviral disease, unspecified: Secondary | ICD-10-CM | POA: Diagnosis not present

## 2017-09-15 DIAGNOSIS — Z79899 Other long term (current) drug therapy: Secondary | ICD-10-CM | POA: Diagnosis not present

## 2017-09-15 DIAGNOSIS — Z942 Lung transplant status: Secondary | ICD-10-CM | POA: Diagnosis not present

## 2017-09-22 DIAGNOSIS — T86818 Other complications of lung transplant: Secondary | ICD-10-CM | POA: Diagnosis not present

## 2017-09-22 DIAGNOSIS — D849 Immunodeficiency, unspecified: Secondary | ICD-10-CM | POA: Diagnosis not present

## 2017-09-22 DIAGNOSIS — T86811 Lung transplant failure: Secondary | ICD-10-CM | POA: Diagnosis not present

## 2017-09-22 DIAGNOSIS — T17890A Other foreign object in other parts of respiratory tract causing asphyxiation, initial encounter: Secondary | ICD-10-CM | POA: Diagnosis not present

## 2017-09-22 DIAGNOSIS — R918 Other nonspecific abnormal finding of lung field: Secondary | ICD-10-CM | POA: Diagnosis not present

## 2017-09-22 DIAGNOSIS — T86819 Unspecified complication of lung transplant: Secondary | ICD-10-CM | POA: Diagnosis not present

## 2017-09-22 DIAGNOSIS — Z79899 Other long term (current) drug therapy: Secondary | ICD-10-CM | POA: Diagnosis not present

## 2017-09-22 DIAGNOSIS — R942 Abnormal results of pulmonary function studies: Secondary | ICD-10-CM | POA: Diagnosis not present

## 2017-09-22 DIAGNOSIS — Z942 Lung transplant status: Secondary | ICD-10-CM | POA: Diagnosis not present

## 2017-09-22 DIAGNOSIS — J8410 Pulmonary fibrosis, unspecified: Secondary | ICD-10-CM | POA: Diagnosis not present

## 2017-09-30 DIAGNOSIS — Z79899 Other long term (current) drug therapy: Secondary | ICD-10-CM | POA: Diagnosis not present

## 2017-09-30 DIAGNOSIS — Z942 Lung transplant status: Secondary | ICD-10-CM | POA: Diagnosis not present

## 2017-10-02 ENCOUNTER — Ambulatory Visit: Payer: 59 | Admitting: Family Medicine

## 2017-10-02 ENCOUNTER — Encounter: Payer: Self-pay | Admitting: Family Medicine

## 2017-10-02 VITALS — BP 154/76 | HR 89 | Temp 98.3°F | Resp 18 | Ht 61.0 in | Wt 109.3 lb

## 2017-10-02 DIAGNOSIS — D75839 Thrombocytosis, unspecified: Secondary | ICD-10-CM

## 2017-10-02 DIAGNOSIS — D899 Disorder involving the immune mechanism, unspecified: Secondary | ICD-10-CM

## 2017-10-02 DIAGNOSIS — I10 Essential (primary) hypertension: Secondary | ICD-10-CM

## 2017-10-02 DIAGNOSIS — F320 Major depressive disorder, single episode, mild: Secondary | ICD-10-CM | POA: Diagnosis not present

## 2017-10-02 DIAGNOSIS — J3089 Other allergic rhinitis: Secondary | ICD-10-CM

## 2017-10-02 DIAGNOSIS — J302 Other seasonal allergic rhinitis: Secondary | ICD-10-CM

## 2017-10-02 DIAGNOSIS — Z942 Lung transplant status: Secondary | ICD-10-CM

## 2017-10-02 DIAGNOSIS — M349 Systemic sclerosis, unspecified: Secondary | ICD-10-CM

## 2017-10-02 DIAGNOSIS — D473 Essential (hemorrhagic) thrombocythemia: Secondary | ICD-10-CM

## 2017-10-02 DIAGNOSIS — I471 Supraventricular tachycardia: Secondary | ICD-10-CM

## 2017-10-02 DIAGNOSIS — D849 Immunodeficiency, unspecified: Secondary | ICD-10-CM

## 2017-10-02 NOTE — Progress Notes (Signed)
Name: Erika Martinez   MRN: [id number]    DOB: [birth year]   Date:10/03/2017       Progress Note  Subjective  Chief Complaint  Chief Complaint  Patient presents with  . Medication Refill    3 month F/U  . Depression  . SVT  . Scleroderma/history of lung transplant    HPI   Scleroderma/history of lung transplant: she had the transplant back in 08/2016, she has been off oxygen, she went through pulmonary rehab however states she is still feeling very weak and would like to be able to walk again without the assistance of her walker. She takes medications given by [facility] as prescribed, she takes immunosuppressants and had multiple admissions to [facility] since lung transplant  Depression major: she seems to be feeling better, has a little more energy, she was approved for disability but is still worried about how long she can pay for [employer], waiting on Medicare. Denies suicidal thoughts or ideation  SVT: doing well on Metoprolol , denies chest pain or palpitation, however her bp is elevated. She states usually higher when goes on high dose prednisone. Advised her to monitor at home and if remains elevated we will need to adjust the dose of metoprolol or add another agent.   Thrombocytosis: she has labs done weekly and is monitored by [facility]  AR: she is still having symptoms of nasal congestion and mild rhinorrhea, but stable   Patient Active Problem List   Diagnosis Date Noted  . Thrombocytosis ([place]) 06/12/2017  . Hypokalemia 05/26/2017  . Lymphocytopenia 05/26/2017  . H/O Clostridium difficile infection 04/21/2017  . Severe protein-calorie malnutrition ([place]) 03/22/2017  . Immunosuppressed status ([place]) 12/02/2016  . CKD (chronic kidney disease) stage 3, GFR 30-59 ml/min (HCC) 11/13/2016  . Immunocompromised ([place]) 08/28/2016  . History of lung transplant ([place]) 08/26/2016  . Raynaud disease 11/26/2015  . Decreased GFR 07/24/2015  . Mild major depression ([place]) 06/07/2015   . Hypertension, benign 04/06/2015  . History of hepatitis C 02/02/2015  . Insomnia 02/02/2015  . Gastro-esophageal reflux disease without esophagitis 02/02/2015  . IBS (irritable bowel syndrome) 02/02/2015  . Low serum cobalamin 02/02/2015  . Perennial allergic rhinitis with seasonal variation 02/02/2015  . Scleroderma ([place]) 02/02/2015  . SPL (spondylolisthesis) 02/02/2015  . Supraventricular tachycardia ([place]) 02/02/2015  . Vitamin D deficiency 02/02/2015  . Anxiety 02/02/2015  . History of pneumococcal pneumonia 09/13/2013  . Polycythemia, secondary 09/13/2013    Past Surgical History:  Procedure Laterality Date  . ABDOMINAL HYSTERECTOMY    . LUNG TRANSPLANT, DOUBLE    . VAGINAL HYSTERECTOMY      Family History  Problem Relation Age of Onset  . Lung cancer Father   . Cancer Father   . Alzheimer's disease Mother   . Breast cancer Neg Hx     Social History   Socioeconomic History  . Marital status: Divorced    Spouse name: Not on file  . Number of children: 1  . Years of education: Not on file  . Highest education level: Not on file  Occupational History  . Occupation: disabled     Comment: scleroderma/lung transplant  Social Needs  . Financial resource strain: Not on file  . Food insecurity:    Worry: Not on file    Inability: Not on file  . Transportation needs:    Medical: Not on file    Non-medical: Not on file  Tobacco Use  . Smoking status: Never Smoker  . Smokeless  tobacco: Never Used  Substance and Sexual Activity  . Alcohol use: Yes    Alcohol/week: 2.0 oz    Types: 4 Standard drinks or equivalent per week    Comment: "social drinker"  . Drug use: No  . Sexual activity: Not Currently  Lifestyle  . Physical activity:    Days per week: Not on file    Minutes per session: Not on file  . Stress: Not on file  Relationships  . Social connections:    Talks on phone: Not on file    Gets together: Not on file    Attends religious service: Not on  file    Active member of club or organization: Not on file    Attends meetings of clubs or organizations: Not on file    Relationship status: Not on file  . Intimate partner violence:    Fear of current or ex partner: Not on file    Emotionally abused: Not on file    Physically abused: Not on file    Forced sexual activity: Not on file  Other Topics Concern  . Not on file  Social History Narrative   She lives with her daughter. Used to work at [employer] until lung transplant 08/2016   I personally reviewed Family, Social and Surgical history with the patient/caregiver today.   Current Outpatient Medications:  .  acetaminophen (TYLENOL) 500 MG tablet, Take 1 tablet by mouth every 4 (four) hours as needed., Disp: , Rfl:  .  aspirin 81 MG chewable tablet, Chew 1 tablet by mouth daily., Disp: , Rfl:  .  azaTHIOprine (IMURAN) 50 MG tablet, Take 1 tablet (50 mg total) by mouth daily., Disp: 60 tablet, Rfl: 0 .  Calcium Citrate-Vitamin D (CALCIUM CITRATE+D3 PETITES) 200-250 MG-UNIT TABS, Take 3 tablets by mouth 2 (two) times daily., Disp: , Rfl:  .  calcium elemental as carbonate (TUMS ULTRA 1000) 400 MG chewable tablet, Chew by mouth., Disp: , Rfl:  .  cholecalciferol (VITAMIN D) 1000 UNITS tablet, Take 2,000 Units by mouth daily., Disp: , Rfl:  .  loratadine (CLARITIN) 10 MG tablet, TAKE 1 TABLET (10 MG TOTAL) BY MOUTH 2 (TWO) TIMES DAILY., Disp: 60 tablet, Rfl: 1 .  metoprolol tartrate (LOPRESSOR) 25 MG tablet, Take 0.5 tablets (12.5 mg total) by mouth 2 (two) times daily., Disp: 30 tablet, Rfl: 0 .  Multiple Vitamin (MULTI-VITAMINS) TABS, Take 1 tablet by mouth daily., Disp: , Rfl:  .  nystatin (MYCOSTATIN) 100000 UNIT/ML suspension, Take 5 mLs (500,000 Units total) by mouth 4 (four) times daily., Disp: 60 mL, Rfl: 0 .  ondansetron (ZOFRAN) 4 MG tablet, Take 1 tablet (4 mg total) by mouth every 8 (eight) hours as needed for nausea or vomiting., Disp: 30 tablet, Rfl: 0 .  predniSONE  (DELTASONE) 5 MG tablet, Take 4 tablets by mouth daily. Equals 20 mg daily, Disp: , Rfl:  .  ranitidine (ZANTAC) 150 MG tablet, TAKE 1 TABLET BY MOUTH TWICE A DAY *TABS PER INS*, Disp: , Rfl: 11 .  Skin Protectants, Misc. (EUCERIN) cream, Apply topically., Disp: , Rfl:  .  sulfamethoxazole-trimethoprim (BACTRIM DS,SEPTRA DS) 800-160 MG tablet, TAKE 1 TABLET BY MOUTH 3 (THREE) TIMES A WEEK., Disp: , Rfl: 5 .  tacrolimus (PROGRAF) 1 MG capsule, Take 1 mg by mouth daily. All at one time every 24 hours-8 mg., Disp: , Rfl:  .  valGANciclovir (VALCYTE) 450 MG tablet, Take 900 mg by mouth daily. , Disp: , Rfl:  .  Vancomycin HCl [facility]) 50 MG/ML SOLR, Take 2.40ml every 12 hours until 3/12 then 2.32ml until 3/19 then every other day until 5/14, Disp: , Rfl:  .  VENTOLIN HFA 108 (90 Base) MCG/ACT inhaler, INHALE 2 PUFFS INTO THE LUNGS EVERY 4 (FOUR) HOURS AS NEEDED FOR WHEEZING OR SHORTNESS OF BREATH., Disp: 18 g, Rfl: 5 .  vitamin B-12 (CYANOCOBALAMIN) 250 MCG tablet, Place 250 mcg under the tongue every other day. , Disp: , Rfl:  .  clotrimazole (MYCELEX) 10 MG troche, DISSOLVE 1 TABLET BY MOUTH 5 TIMES DAILY DISSOLVE SLOWLY AND COMPLETELY IN MOUTH. END DATE 06/10/17, Disp: , Rfl: 0 .  DEXILANT 30 MG capsule, TAKE 60MG  DAILY FOR 8 WEEKS, THEN DECREASE DOSE TO 30MG  DAILY *INSURANCE ONLY PAYS FOR 1 DAILY, Disp: , Rfl: 11 .  fluticasone (FLONASE) 50 MCG/ACT nasal spray, SPRAY 2 SPRAYS INTO EACH NOSTRIL EVERY DAY (Patient not taking: Reported on 10/02/2017), Disp: 16 g, Rfl: 6 .  magnesium oxide (MAG-OX) 400 MG tablet, Take 1 tablet by mouth daily., Disp: , Rfl:   Allergies  Allergen Reactions  . Nsaids Other (See Comments)    Unable to receive NSAIDs as pt is s/p lung transplant     ROS  Constitutional: Negative for fever , positive for mild  weight change.  Respiratory: positive  For occasional  cough and mild  shortness of breath.   Cardiovascular: Negative for chest pain or palpitations.   Gastrointestinal: Negative for abdominal pain, no bowel changes.  Musculoskeletal: Positive for gait problem ( secondary to muscle weakness) but no  joint swelling.  Skin: Negative for rash.  Neurological: Negative for dizziness or headache.  No other specific complaints in a complete review of systems (except as listed in HPI above).  Objective  Vitals:   10/02/17 1221 10/02/17 1314  BP: (!) 154/78 (!) 154/76  Pulse: 89   Resp: 18   Temp: 98.3 F (36.8 C)   TempSrc: Oral   SpO2: 96%   Weight: 109 lb 4.8 oz (49.6 kg)   Height: 5\' 1"  (1.549 m)     Body mass index is 20.65 kg/m.  Physical Exam  Constitutional: Patient appears well-developed and well-nourished. No distress.  HEENT: head atraumatic, normocephalic, pupils equal and reactive to light,  neck supple, throat within normal limits Cardiovascular: Normal rate, regular rhythm and normal heart sounds.  No murmur heard. No BLE edema. Pulmonary/Chest: Effort normal, coarse crackles on both bases. No respiratory distress. Abdominal: Soft.  There is no tenderness. Psychiatric: Patient has a normal mood and affect. behavior is normal. Judgment and thought content normal.   PHQ2/9: Depression screen [facility] 2/9 10/02/2017 06/12/2017 06/12/2017 01/12/2017 12/02/2016  Decreased Interest 0 1 0 0 0  Down, Depressed, Hopeless 0 1 0 1 0  PHQ - 2 Score 0 2 0 1 0  Altered sleeping 2 1 - 0 -  Tired, decreased energy 2 3 - 2 -  Change in appetite 0 1 - 1 -  Feeling bad or failure about yourself  0 2 - 0 -  Trouble concentrating 0 0 - 0 -  Moving slowly or fidgety/restless 0 0 - 0 -  Suicidal thoughts 0 0 - 0 -  PHQ-9 Score 4 9 - 4 -  Difficult doing work/chores Not difficult at all Somewhat difficult - Not difficult at all -  Some recent data might be hidden     Fall Risk: Fall Risk  10/02/2017 06/12/2017 01/12/2017 12/02/2016 06/27/2016  Falls in the past year?  No No No No No     Functional Status Survey: Is the patient deaf or have  difficulty hearing?: No Does the patient have difficulty seeing, even when wearing glasses/contacts?: No Does the patient have difficulty concentrating, remembering, or making decisions?: No Does the patient have difficulty walking or climbing stairs?: Yes(Walks with a walker) Does the patient have difficulty dressing or bathing?: No Does the patient have difficulty doing errands alone such as visiting a doctor's office or shopping?: Yes(Does not drive)    Assessment & Plan  1. Perennial allergic rhinitis with seasonal variation  Stable  2. Mild major depression (HCC)  Not currently on medication and seems to be doing well, she stopped Lexapro   3. Scleroderma ([place])  Still has some changes on hands ( fingers)  4. Supraventricular tachycardia ([place])  controlled  5. Thrombocytosis ([place])  Monitored by [facility]  6. Immunosuppressed status ([place])  On multiple medications since lung transplant  7. S/P lung transplant ([place])  - Pulmonary rehab therapeutic exercise; Future  8. Hypertension, benign  Elevated and she will monitor and return or call back sooner if stays elevated

## 2017-10-03 ENCOUNTER — Encounter: Payer: Self-pay | Admitting: Family Medicine

## 2017-10-06 DIAGNOSIS — Z79899 Other long term (current) drug therapy: Secondary | ICD-10-CM | POA: Diagnosis not present

## 2017-10-06 DIAGNOSIS — Z942 Lung transplant status: Secondary | ICD-10-CM | POA: Diagnosis not present

## 2017-10-13 DIAGNOSIS — Z79899 Other long term (current) drug therapy: Secondary | ICD-10-CM | POA: Diagnosis not present

## 2017-10-13 DIAGNOSIS — Z942 Lung transplant status: Secondary | ICD-10-CM | POA: Diagnosis not present

## 2017-10-13 DIAGNOSIS — B259 Cytomegaloviral disease, unspecified: Secondary | ICD-10-CM | POA: Diagnosis not present

## 2017-10-15 ENCOUNTER — Other Ambulatory Visit: Payer: Self-pay

## 2017-10-15 ENCOUNTER — Telehealth: Payer: Self-pay

## 2017-10-15 DIAGNOSIS — R197 Diarrhea, unspecified: Secondary | ICD-10-CM

## 2017-10-15 NOTE — Telephone Encounter (Signed)
Ordered , please release and tell patient when ready

## 2017-10-15 NOTE — Telephone Encounter (Signed)
Copied from [place] [id number]. Topic: General - Other >> Oct 15, 2017  9:57 AM Lennox Solders wrote: Reason for CRM: pt had c diff in past and would like to come by and getting kit to have her stool check. Pt is having diarrhea in mornings and light stomach pains. Pt decline an appt

## 2017-10-15 NOTE — Telephone Encounter (Signed)
Patient notified and left labs upfront.

## 2017-10-16 DIAGNOSIS — K219 Gastro-esophageal reflux disease without esophagitis: Secondary | ICD-10-CM | POA: Diagnosis not present

## 2017-10-20 DIAGNOSIS — Z79899 Other long term (current) drug therapy: Secondary | ICD-10-CM | POA: Diagnosis not present

## 2017-10-20 DIAGNOSIS — B259 Cytomegaloviral disease, unspecified: Secondary | ICD-10-CM | POA: Diagnosis not present

## 2017-10-20 DIAGNOSIS — Z942 Lung transplant status: Secondary | ICD-10-CM | POA: Diagnosis not present

## 2017-10-27 DIAGNOSIS — Z79899 Other long term (current) drug therapy: Secondary | ICD-10-CM | POA: Diagnosis not present

## 2017-10-27 DIAGNOSIS — Z942 Lung transplant status: Secondary | ICD-10-CM | POA: Diagnosis not present

## 2017-10-27 DIAGNOSIS — B259 Cytomegaloviral disease, unspecified: Secondary | ICD-10-CM | POA: Diagnosis not present

## 2017-11-03 DIAGNOSIS — Z942 Lung transplant status: Secondary | ICD-10-CM | POA: Diagnosis not present

## 2017-11-03 DIAGNOSIS — Z4824 Encounter for aftercare following lung transplant: Secondary | ICD-10-CM | POA: Diagnosis not present

## 2017-11-03 DIAGNOSIS — T86818 Other complications of lung transplant: Secondary | ICD-10-CM | POA: Diagnosis not present

## 2017-11-03 DIAGNOSIS — Z5181 Encounter for therapeutic drug level monitoring: Secondary | ICD-10-CM | POA: Diagnosis not present

## 2017-11-03 DIAGNOSIS — T86811 Lung transplant failure: Secondary | ICD-10-CM | POA: Diagnosis not present

## 2017-11-03 DIAGNOSIS — I129 Hypertensive chronic kidney disease with stage 1 through stage 4 chronic kidney disease, or unspecified chronic kidney disease: Secondary | ICD-10-CM | POA: Diagnosis not present

## 2017-11-03 DIAGNOSIS — N189 Chronic kidney disease, unspecified: Secondary | ICD-10-CM | POA: Diagnosis not present

## 2017-11-17 DIAGNOSIS — Z79899 Other long term (current) drug therapy: Secondary | ICD-10-CM | POA: Diagnosis not present

## 2017-11-17 DIAGNOSIS — Z942 Lung transplant status: Secondary | ICD-10-CM | POA: Diagnosis not present

## 2017-11-20 ENCOUNTER — Other Ambulatory Visit: Payer: Self-pay | Admitting: Family Medicine

## 2017-11-22 DIAGNOSIS — E43 Unspecified severe protein-calorie malnutrition: Secondary | ICD-10-CM | POA: Diagnosis not present

## 2017-11-22 DIAGNOSIS — E860 Dehydration: Secondary | ICD-10-CM | POA: Diagnosis not present

## 2017-11-22 DIAGNOSIS — N183 Chronic kidney disease, stage 3 (moderate): Secondary | ICD-10-CM | POA: Diagnosis not present

## 2017-11-22 DIAGNOSIS — Z942 Lung transplant status: Secondary | ICD-10-CM | POA: Diagnosis not present

## 2017-11-22 DIAGNOSIS — A0471 Enterocolitis due to Clostridium difficile, recurrent: Secondary | ICD-10-CM | POA: Diagnosis not present

## 2017-11-22 DIAGNOSIS — A0472 Enterocolitis due to Clostridium difficile, not specified as recurrent: Secondary | ICD-10-CM | POA: Diagnosis not present

## 2017-11-22 DIAGNOSIS — N170 Acute kidney failure with tubular necrosis: Secondary | ICD-10-CM | POA: Diagnosis not present

## 2017-12-01 DIAGNOSIS — Z942 Lung transplant status: Secondary | ICD-10-CM | POA: Diagnosis not present

## 2017-12-01 DIAGNOSIS — Z7682 Awaiting organ transplant status: Secondary | ICD-10-CM | POA: Diagnosis not present

## 2017-12-01 DIAGNOSIS — R918 Other nonspecific abnormal finding of lung field: Secondary | ICD-10-CM | POA: Diagnosis not present

## 2017-12-01 DIAGNOSIS — Z5181 Encounter for therapeutic drug level monitoring: Secondary | ICD-10-CM | POA: Diagnosis not present

## 2017-12-01 DIAGNOSIS — Z79899 Other long term (current) drug therapy: Secondary | ICD-10-CM | POA: Diagnosis not present

## 2017-12-09 DIAGNOSIS — Z79899 Other long term (current) drug therapy: Secondary | ICD-10-CM | POA: Diagnosis not present

## 2017-12-09 DIAGNOSIS — Z942 Lung transplant status: Secondary | ICD-10-CM | POA: Diagnosis not present

## 2017-12-16 DIAGNOSIS — Z942 Lung transplant status: Secondary | ICD-10-CM | POA: Diagnosis not present

## 2017-12-16 DIAGNOSIS — B259 Cytomegaloviral disease, unspecified: Secondary | ICD-10-CM | POA: Diagnosis not present

## 2017-12-16 DIAGNOSIS — Z79899 Other long term (current) drug therapy: Secondary | ICD-10-CM | POA: Diagnosis not present

## 2017-12-23 DIAGNOSIS — B259 Cytomegaloviral disease, unspecified: Secondary | ICD-10-CM | POA: Diagnosis not present

## 2017-12-23 DIAGNOSIS — Z79899 Other long term (current) drug therapy: Secondary | ICD-10-CM | POA: Diagnosis not present

## 2017-12-23 DIAGNOSIS — Z942 Lung transplant status: Secondary | ICD-10-CM | POA: Diagnosis not present

## 2017-12-29 DIAGNOSIS — Z942 Lung transplant status: Secondary | ICD-10-CM | POA: Diagnosis not present

## 2017-12-29 DIAGNOSIS — Z5181 Encounter for therapeutic drug level monitoring: Secondary | ICD-10-CM | POA: Diagnosis not present

## 2017-12-29 DIAGNOSIS — Z79899 Other long term (current) drug therapy: Secondary | ICD-10-CM | POA: Diagnosis not present

## 2017-12-29 DIAGNOSIS — A0471 Enterocolitis due to Clostridium difficile, recurrent: Secondary | ICD-10-CM | POA: Diagnosis not present

## 2017-12-29 DIAGNOSIS — I10 Essential (primary) hypertension: Secondary | ICD-10-CM | POA: Diagnosis not present

## 2017-12-29 DIAGNOSIS — A0472 Enterocolitis due to Clostridium difficile, not specified as recurrent: Secondary | ICD-10-CM | POA: Diagnosis not present

## 2017-12-29 DIAGNOSIS — Z4824 Encounter for aftercare following lung transplant: Secondary | ICD-10-CM | POA: Diagnosis not present

## 2018-01-12 DIAGNOSIS — Z79899 Other long term (current) drug therapy: Secondary | ICD-10-CM | POA: Diagnosis not present

## 2018-01-12 DIAGNOSIS — Z942 Lung transplant status: Secondary | ICD-10-CM | POA: Diagnosis not present

## 2018-01-19 DIAGNOSIS — Z01818 Encounter for other preprocedural examination: Secondary | ICD-10-CM | POA: Diagnosis not present

## 2018-01-20 DIAGNOSIS — Z942 Lung transplant status: Secondary | ICD-10-CM | POA: Diagnosis not present

## 2018-01-20 DIAGNOSIS — K224 Dyskinesia of esophagus: Secondary | ICD-10-CM | POA: Diagnosis not present

## 2018-01-20 DIAGNOSIS — K210 Gastro-esophageal reflux disease with esophagitis: Secondary | ICD-10-CM | POA: Diagnosis not present

## 2018-01-20 DIAGNOSIS — K449 Diaphragmatic hernia without obstruction or gangrene: Secondary | ICD-10-CM | POA: Diagnosis not present

## 2018-01-21 DIAGNOSIS — D899 Disorder involving the immune mechanism, unspecified: Secondary | ICD-10-CM | POA: Diagnosis not present

## 2018-01-21 DIAGNOSIS — Z942 Lung transplant status: Secondary | ICD-10-CM | POA: Diagnosis not present

## 2018-01-29 DIAGNOSIS — H6123 Impacted cerumen, bilateral: Secondary | ICD-10-CM | POA: Diagnosis not present

## 2018-01-29 DIAGNOSIS — H8111 Benign paroxysmal vertigo, right ear: Secondary | ICD-10-CM | POA: Diagnosis not present

## 2018-01-29 DIAGNOSIS — R42 Dizziness and giddiness: Secondary | ICD-10-CM | POA: Diagnosis not present

## 2018-02-02 DIAGNOSIS — Z79899 Other long term (current) drug therapy: Secondary | ICD-10-CM | POA: Diagnosis not present

## 2018-02-02 DIAGNOSIS — B259 Cytomegaloviral disease, unspecified: Secondary | ICD-10-CM | POA: Diagnosis not present

## 2018-02-02 DIAGNOSIS — Z942 Lung transplant status: Secondary | ICD-10-CM | POA: Diagnosis not present

## 2018-02-02 DIAGNOSIS — R42 Dizziness and giddiness: Secondary | ICD-10-CM | POA: Diagnosis not present

## 2018-02-02 DIAGNOSIS — H8111 Benign paroxysmal vertigo, right ear: Secondary | ICD-10-CM | POA: Diagnosis not present

## 2018-02-05 DIAGNOSIS — K219 Gastro-esophageal reflux disease without esophagitis: Secondary | ICD-10-CM | POA: Diagnosis not present

## 2018-02-07 ENCOUNTER — Other Ambulatory Visit: Payer: Self-pay | Admitting: Family Medicine

## 2018-02-09 DIAGNOSIS — J90 Pleural effusion, not elsewhere classified: Secondary | ICD-10-CM | POA: Diagnosis not present

## 2018-02-09 DIAGNOSIS — Z5181 Encounter for therapeutic drug level monitoring: Secondary | ICD-10-CM | POA: Diagnosis not present

## 2018-02-09 DIAGNOSIS — Z79899 Other long term (current) drug therapy: Secondary | ICD-10-CM | POA: Diagnosis not present

## 2018-02-09 DIAGNOSIS — R918 Other nonspecific abnormal finding of lung field: Secondary | ICD-10-CM | POA: Diagnosis not present

## 2018-02-09 DIAGNOSIS — T86818 Other complications of lung transplant: Secondary | ICD-10-CM | POA: Diagnosis not present

## 2018-02-09 DIAGNOSIS — K219 Gastro-esophageal reflux disease without esophagitis: Secondary | ICD-10-CM | POA: Diagnosis not present

## 2018-02-09 DIAGNOSIS — Z942 Lung transplant status: Secondary | ICD-10-CM | POA: Diagnosis not present

## 2018-02-16 DIAGNOSIS — I5020 Unspecified systolic (congestive) heart failure: Secondary | ICD-10-CM | POA: Diagnosis not present

## 2018-02-16 DIAGNOSIS — R571 Hypovolemic shock: Secondary | ICD-10-CM | POA: Diagnosis not present

## 2018-02-16 DIAGNOSIS — Z430 Encounter for attention to tracheostomy: Secondary | ICD-10-CM | POA: Diagnosis not present

## 2018-02-16 DIAGNOSIS — J849 Interstitial pulmonary disease, unspecified: Secondary | ICD-10-CM | POA: Diagnosis not present

## 2018-02-16 DIAGNOSIS — R918 Other nonspecific abnormal finding of lung field: Secondary | ICD-10-CM | POA: Diagnosis not present

## 2018-02-16 DIAGNOSIS — R1312 Dysphagia, oropharyngeal phase: Secondary | ICD-10-CM | POA: Diagnosis not present

## 2018-02-16 DIAGNOSIS — Z4659 Encounter for fitting and adjustment of other gastrointestinal appliance and device: Secondary | ICD-10-CM | POA: Diagnosis not present

## 2018-02-16 DIAGNOSIS — D899 Disorder involving the immune mechanism, unspecified: Secondary | ICD-10-CM | POA: Diagnosis not present

## 2018-02-16 DIAGNOSIS — J9690 Respiratory failure, unspecified, unspecified whether with hypoxia or hypercapnia: Secondary | ICD-10-CM | POA: Diagnosis not present

## 2018-02-16 DIAGNOSIS — Z8619 Personal history of other infectious and parasitic diseases: Secondary | ICD-10-CM | POA: Diagnosis not present

## 2018-02-16 DIAGNOSIS — Z136 Encounter for screening for cardiovascular disorders: Secondary | ICD-10-CM | POA: Diagnosis not present

## 2018-02-16 DIAGNOSIS — K529 Noninfective gastroenteritis and colitis, unspecified: Secondary | ICD-10-CM | POA: Diagnosis not present

## 2018-02-16 DIAGNOSIS — D72825 Bandemia: Secondary | ICD-10-CM | POA: Diagnosis not present

## 2018-02-16 DIAGNOSIS — A0471 Enterocolitis due to Clostridium difficile, recurrent: Secondary | ICD-10-CM | POA: Diagnosis not present

## 2018-02-16 DIAGNOSIS — D649 Anemia, unspecified: Secondary | ICD-10-CM | POA: Diagnosis not present

## 2018-02-16 DIAGNOSIS — J3489 Other specified disorders of nose and nasal sinuses: Secondary | ICD-10-CM | POA: Diagnosis not present

## 2018-02-16 DIAGNOSIS — G4701 Insomnia due to medical condition: Secondary | ICD-10-CM | POA: Diagnosis not present

## 2018-02-16 DIAGNOSIS — R0602 Shortness of breath: Secondary | ICD-10-CM | POA: Diagnosis not present

## 2018-02-16 DIAGNOSIS — N183 Chronic kidney disease, stage 3 (moderate): Secondary | ICD-10-CM | POA: Diagnosis not present

## 2018-02-16 DIAGNOSIS — R491 Aphonia: Secondary | ICD-10-CM | POA: Diagnosis not present

## 2018-02-16 DIAGNOSIS — R112 Nausea with vomiting, unspecified: Secondary | ICD-10-CM | POA: Diagnosis not present

## 2018-02-16 DIAGNOSIS — D72829 Elevated white blood cell count, unspecified: Secondary | ICD-10-CM | POA: Diagnosis not present

## 2018-02-16 DIAGNOSIS — R739 Hyperglycemia, unspecified: Secondary | ICD-10-CM | POA: Diagnosis not present

## 2018-02-16 DIAGNOSIS — J9602 Acute respiratory failure with hypercapnia: Secondary | ICD-10-CM | POA: Diagnosis not present

## 2018-02-16 DIAGNOSIS — C787 Secondary malignant neoplasm of liver and intrahepatic bile duct: Secondary | ICD-10-CM | POA: Diagnosis not present

## 2018-02-16 DIAGNOSIS — A0472 Enterocolitis due to Clostridium difficile, not specified as recurrent: Secondary | ICD-10-CM | POA: Diagnosis not present

## 2018-02-16 DIAGNOSIS — Z789 Other specified health status: Secondary | ICD-10-CM | POA: Diagnosis not present

## 2018-02-16 DIAGNOSIS — E43 Unspecified severe protein-calorie malnutrition: Secondary | ICD-10-CM | POA: Diagnosis not present

## 2018-02-16 DIAGNOSIS — J9601 Acute respiratory failure with hypoxia: Secondary | ICD-10-CM | POA: Diagnosis not present

## 2018-02-16 DIAGNOSIS — R609 Edema, unspecified: Secondary | ICD-10-CM | POA: Diagnosis not present

## 2018-02-16 DIAGNOSIS — R1032 Left lower quadrant pain: Secondary | ICD-10-CM | POA: Diagnosis not present

## 2018-02-16 DIAGNOSIS — E8770 Fluid overload, unspecified: Secondary | ICD-10-CM | POA: Diagnosis not present

## 2018-02-16 DIAGNOSIS — J9611 Chronic respiratory failure with hypoxia: Secondary | ICD-10-CM | POA: Diagnosis not present

## 2018-02-16 DIAGNOSIS — Z5181 Encounter for therapeutic drug level monitoring: Secondary | ICD-10-CM | POA: Diagnosis not present

## 2018-02-16 DIAGNOSIS — I5021 Acute systolic (congestive) heart failure: Secondary | ICD-10-CM | POA: Diagnosis not present

## 2018-02-16 DIAGNOSIS — M79601 Pain in right arm: Secondary | ICD-10-CM | POA: Diagnosis not present

## 2018-02-16 DIAGNOSIS — R110 Nausea: Secondary | ICD-10-CM | POA: Diagnosis not present

## 2018-02-16 DIAGNOSIS — T86818 Other complications of lung transplant: Secondary | ICD-10-CM | POA: Diagnosis not present

## 2018-02-16 DIAGNOSIS — E875 Hyperkalemia: Secondary | ICD-10-CM | POA: Diagnosis not present

## 2018-02-16 DIAGNOSIS — N179 Acute kidney failure, unspecified: Secondary | ICD-10-CM | POA: Diagnosis not present

## 2018-02-16 DIAGNOSIS — Z4682 Encounter for fitting and adjustment of non-vascular catheter: Secondary | ICD-10-CM | POA: Diagnosis not present

## 2018-02-16 DIAGNOSIS — C189 Malignant neoplasm of colon, unspecified: Secondary | ICD-10-CM | POA: Diagnosis not present

## 2018-02-16 DIAGNOSIS — Z431 Encounter for attention to gastrostomy: Secondary | ICD-10-CM | POA: Diagnosis not present

## 2018-02-16 DIAGNOSIS — J811 Chronic pulmonary edema: Secondary | ICD-10-CM | POA: Diagnosis not present

## 2018-02-16 DIAGNOSIS — J9811 Atelectasis: Secondary | ICD-10-CM | POA: Diagnosis not present

## 2018-02-16 DIAGNOSIS — K769 Liver disease, unspecified: Secondary | ICD-10-CM | POA: Diagnosis not present

## 2018-02-16 DIAGNOSIS — K922 Gastrointestinal hemorrhage, unspecified: Secondary | ICD-10-CM | POA: Diagnosis not present

## 2018-02-16 DIAGNOSIS — K625 Hemorrhage of anus and rectum: Secondary | ICD-10-CM | POA: Diagnosis not present

## 2018-02-16 DIAGNOSIS — K567 Ileus, unspecified: Secondary | ICD-10-CM | POA: Diagnosis not present

## 2018-02-16 DIAGNOSIS — D62 Acute posthemorrhagic anemia: Secondary | ICD-10-CM | POA: Diagnosis not present

## 2018-02-16 DIAGNOSIS — R531 Weakness: Secondary | ICD-10-CM | POA: Diagnosis not present

## 2018-02-16 DIAGNOSIS — K562 Volvulus: Secondary | ICD-10-CM | POA: Diagnosis not present

## 2018-02-16 DIAGNOSIS — I5181 Takotsubo syndrome: Secondary | ICD-10-CM | POA: Diagnosis not present

## 2018-02-16 DIAGNOSIS — T86810 Lung transplant rejection: Secondary | ICD-10-CM | POA: Diagnosis not present

## 2018-02-16 DIAGNOSIS — J9691 Respiratory failure, unspecified with hypoxia: Secondary | ICD-10-CM | POA: Diagnosis not present

## 2018-02-16 DIAGNOSIS — R1084 Generalized abdominal pain: Secondary | ICD-10-CM | POA: Diagnosis not present

## 2018-02-16 DIAGNOSIS — J984 Other disorders of lung: Secondary | ICD-10-CM | POA: Diagnosis not present

## 2018-02-16 DIAGNOSIS — J8489 Other specified interstitial pulmonary diseases: Secondary | ICD-10-CM | POA: Diagnosis not present

## 2018-02-16 DIAGNOSIS — J939 Pneumothorax, unspecified: Secondary | ICD-10-CM | POA: Diagnosis not present

## 2018-02-16 DIAGNOSIS — G4700 Insomnia, unspecified: Secondary | ICD-10-CM | POA: Diagnosis not present

## 2018-02-16 DIAGNOSIS — K3189 Other diseases of stomach and duodenum: Secondary | ICD-10-CM | POA: Diagnosis not present

## 2018-02-16 DIAGNOSIS — R5381 Other malaise: Secondary | ICD-10-CM | POA: Diagnosis not present

## 2018-02-16 DIAGNOSIS — T86819 Unspecified complication of lung transplant: Secondary | ICD-10-CM | POA: Diagnosis not present

## 2018-02-16 DIAGNOSIS — A419 Sepsis, unspecified organism: Secondary | ICD-10-CM | POA: Diagnosis not present

## 2018-02-16 DIAGNOSIS — R109 Unspecified abdominal pain: Secondary | ICD-10-CM | POA: Diagnosis not present

## 2018-02-16 DIAGNOSIS — J9610 Chronic respiratory failure, unspecified whether with hypoxia or hypercapnia: Secondary | ICD-10-CM | POA: Diagnosis not present

## 2018-02-16 DIAGNOSIS — Z792 Long term (current) use of antibiotics: Secondary | ICD-10-CM | POA: Diagnosis not present

## 2018-02-16 DIAGNOSIS — E440 Moderate protein-calorie malnutrition: Secondary | ICD-10-CM | POA: Diagnosis not present

## 2018-02-16 DIAGNOSIS — Z452 Encounter for adjustment and management of vascular access device: Secondary | ICD-10-CM | POA: Diagnosis not present

## 2018-02-16 DIAGNOSIS — R140 Abdominal distension (gaseous): Secondary | ICD-10-CM | POA: Diagnosis not present

## 2018-02-16 DIAGNOSIS — Z942 Lung transplant status: Secondary | ICD-10-CM | POA: Diagnosis not present

## 2018-02-16 DIAGNOSIS — T380X5A Adverse effect of glucocorticoids and synthetic analogues, initial encounter: Secondary | ICD-10-CM | POA: Diagnosis not present

## 2018-02-16 DIAGNOSIS — E860 Dehydration: Secondary | ICD-10-CM | POA: Diagnosis not present

## 2018-02-16 DIAGNOSIS — M349 Systemic sclerosis, unspecified: Secondary | ICD-10-CM | POA: Diagnosis not present

## 2018-02-16 DIAGNOSIS — Z9911 Dependence on respirator [ventilator] status: Secondary | ICD-10-CM | POA: Diagnosis not present

## 2018-02-16 DIAGNOSIS — J90 Pleural effusion, not elsewhere classified: Secondary | ICD-10-CM | POA: Diagnosis not present

## 2018-06-22 DIAGNOSIS — E43 Unspecified severe protein-calorie malnutrition: Secondary | ICD-10-CM | POA: Diagnosis not present

## 2018-06-22 DIAGNOSIS — J9602 Acute respiratory failure with hypercapnia: Secondary | ICD-10-CM | POA: Diagnosis not present

## 2018-06-23 DIAGNOSIS — M3481 Systemic sclerosis with lung involvement: Secondary | ICD-10-CM | POA: Diagnosis not present

## 2018-06-23 DIAGNOSIS — T86810 Lung transplant rejection: Secondary | ICD-10-CM | POA: Diagnosis not present

## 2018-06-23 DIAGNOSIS — J9621 Acute and chronic respiratory failure with hypoxia: Secondary | ICD-10-CM | POA: Diagnosis not present

## 2018-06-24 ENCOUNTER — Telehealth: Payer: Self-pay

## 2018-06-24 NOTE — Telephone Encounter (Signed)
Medicare requires a face-to-face encounter. I can do it once she comes in , likely easier to get [facility] to do it for her

## 2018-06-24 NOTE — Telephone Encounter (Signed)
Spoke to Diane she will [facility] to [facility]

## 2018-06-24 NOTE — Telephone Encounter (Signed)
Copied from [place] [id number]. Topic: Quick Communication - Home Health Verbal Orders >> Jun 23, 2018  4:49 PM Ivar Drape wrote: Caller/Agency:  Diane w/[facility] Number:  [phone] Requesting OT/PT/Skilled Nursing/Social Work:  Patient has been in the hospital at [facility] for 4 months now, and they have listed Dr. Ancil Boozer for her home health orders.  They want to know if Dr. Ancil Boozer will do this or do she want them to go back to [facility] for the orders. Frequency:  2w2

## 2018-06-25 DIAGNOSIS — J9621 Acute and chronic respiratory failure with hypoxia: Secondary | ICD-10-CM | POA: Diagnosis not present

## 2018-06-25 DIAGNOSIS — Z9889 Other specified postprocedural states: Secondary | ICD-10-CM | POA: Diagnosis not present

## 2018-06-25 DIAGNOSIS — R0602 Shortness of breath: Secondary | ICD-10-CM | POA: Diagnosis not present

## 2018-06-25 DIAGNOSIS — T86818 Other complications of lung transplant: Secondary | ICD-10-CM | POA: Diagnosis not present

## 2018-06-25 DIAGNOSIS — J9622 Acute and chronic respiratory failure with hypercapnia: Secondary | ICD-10-CM | POA: Diagnosis not present

## 2018-06-25 DIAGNOSIS — J90 Pleural effusion, not elsewhere classified: Secondary | ICD-10-CM | POA: Diagnosis not present

## 2018-06-25 DIAGNOSIS — Z942 Lung transplant status: Secondary | ICD-10-CM | POA: Diagnosis not present

## 2018-06-25 DIAGNOSIS — J9601 Acute respiratory failure with hypoxia: Secondary | ICD-10-CM | POA: Diagnosis not present

## 2018-06-25 DIAGNOSIS — R0603 Acute respiratory distress: Secondary | ICD-10-CM | POA: Diagnosis not present

## 2018-06-25 DIAGNOSIS — Z515 Encounter for palliative care: Secondary | ICD-10-CM | POA: Diagnosis not present

## 2018-06-25 DIAGNOSIS — I10 Essential (primary) hypertension: Secondary | ICD-10-CM | POA: Diagnosis not present

## 2018-06-25 DIAGNOSIS — R0689 Other abnormalities of breathing: Secondary | ICD-10-CM | POA: Diagnosis not present

## 2018-06-25 DIAGNOSIS — J9811 Atelectasis: Secondary | ICD-10-CM | POA: Diagnosis not present

## 2018-06-25 DIAGNOSIS — T86810 Lung transplant rejection: Secondary | ICD-10-CM | POA: Diagnosis not present

## 2018-06-25 DIAGNOSIS — R069 Unspecified abnormalities of breathing: Secondary | ICD-10-CM | POA: Diagnosis not present

## 2018-06-25 DIAGNOSIS — D899 Disorder involving the immune mechanism, unspecified: Secondary | ICD-10-CM | POA: Diagnosis not present

## 2018-06-25 DIAGNOSIS — Z9189 Other specified personal risk factors, not elsewhere classified: Secondary | ICD-10-CM | POA: Diagnosis not present

## 2018-06-25 DIAGNOSIS — K3189 Other diseases of stomach and duodenum: Secondary | ICD-10-CM | POA: Diagnosis not present

## 2018-06-25 DIAGNOSIS — N183 Chronic kidney disease, stage 3 (moderate): Secondary | ICD-10-CM | POA: Diagnosis not present

## 2018-06-25 DIAGNOSIS — Z5181 Encounter for therapeutic drug level monitoring: Secondary | ICD-10-CM | POA: Diagnosis not present

## 2018-06-25 DIAGNOSIS — R110 Nausea: Secondary | ICD-10-CM | POA: Diagnosis not present

## 2018-06-25 DIAGNOSIS — N179 Acute kidney failure, unspecified: Secondary | ICD-10-CM | POA: Diagnosis not present

## 2018-06-25 DIAGNOSIS — R918 Other nonspecific abnormal finding of lung field: Secondary | ICD-10-CM | POA: Diagnosis not present

## 2018-06-25 DIAGNOSIS — J811 Chronic pulmonary edema: Secondary | ICD-10-CM | POA: Diagnosis not present

## 2018-06-25 DIAGNOSIS — M3481 Systemic sclerosis with lung involvement: Secondary | ICD-10-CM | POA: Diagnosis not present

## 2018-07-07 MED ORDER — ESCITALOPRAM OXALATE 10 MG PO TABS
20.00 | ORAL_TABLET | ORAL | Status: DC
Start: 2018-07-08 — End: 2018-07-07

## 2018-07-07 MED ORDER — GENERIC EXTERNAL MEDICATION
Status: DC
Start: ? — End: 2018-07-07

## 2018-07-07 MED ORDER — GENERIC EXTERNAL MEDICATION
12.50 | Status: DC
Start: 2018-07-07 — End: 2018-07-07

## 2018-07-07 MED ORDER — CALCIUM CARBONATE ANTACID 750 MG PO CHEW
CHEWABLE_TABLET | ORAL | Status: DC
Start: ? — End: 2018-07-07

## 2018-07-07 MED ORDER — VANCOMYCIN HCL 50 MG/ML PO SOLR
125.00 | ORAL | Status: DC
Start: 2018-07-07 — End: 2018-07-07

## 2018-07-07 MED ORDER — DEXTROSE 10 % IV SOLN
50.00 | INTRAVENOUS | Status: DC
Start: ? — End: 2018-07-07

## 2018-07-07 MED ORDER — MORPHINE SULFATE (PF) 2 MG/ML IV SOLN
1.00 | INTRAVENOUS | Status: DC
Start: ? — End: 2018-07-07

## 2018-07-07 MED ORDER — ALBUTEROL SULFATE 2.5 MG/0.5ML IN NEBU
2.50 | INHALATION_SOLUTION | RESPIRATORY_TRACT | Status: DC
Start: ? — End: 2018-07-07

## 2018-07-07 MED ORDER — PANTOPRAZOLE SODIUM 40 MG PO TBEC
40.00 | DELAYED_RELEASE_TABLET | ORAL | Status: DC
Start: 2018-07-08 — End: 2018-07-07

## 2018-07-07 MED ORDER — MELATONIN 3 MG PO TABS
3.00 | ORAL_TABLET | ORAL | Status: DC
Start: 2018-07-07 — End: 2018-07-07

## 2018-07-07 MED ORDER — IPRATROPIUM BROMIDE 0.03 % NA SOLN
2.00 | NASAL | Status: DC
Start: 2018-07-07 — End: 2018-07-07

## 2018-07-07 MED ORDER — LIDOCAINE HCL (PF) 1 % IJ SOLN
0.50 | INTRAMUSCULAR | Status: DC
Start: ? — End: 2018-07-07

## 2018-07-07 MED ORDER — ONDANSETRON HCL 4 MG/2ML IJ SOLN
4.00 | INTRAMUSCULAR | Status: DC
Start: ? — End: 2018-07-07

## 2018-07-07 MED ORDER — DEXTROSE 50 % IV SOLN
12.50 | INTRAVENOUS | Status: DC
Start: ? — End: 2018-07-07

## 2018-07-07 MED ORDER — ONDANSETRON HCL 4 MG/2ML IJ SOLN
4.00 | INTRAMUSCULAR | Status: DC
Start: 2018-07-07 — End: 2018-07-07

## 2018-07-07 MED ORDER — LIDOCAINE-PRILOCAINE 2.5-2.5 % EX CREA
TOPICAL_CREAM | CUTANEOUS | Status: DC
Start: ? — End: 2018-07-07

## 2018-07-07 MED ORDER — SIMETHICONE 80 MG PO CHEW
80.00 | CHEWABLE_TABLET | ORAL | Status: DC
Start: ? — End: 2018-07-07

## 2018-07-07 MED ORDER — TACROLIMUS 4 MG PO TB24
4.00 | ORAL_TABLET | ORAL | Status: DC
Start: 2018-07-08 — End: 2018-07-07

## 2018-07-07 MED ORDER — LORAZEPAM 2 MG/ML IJ SOLN
0.50 | INTRAMUSCULAR | Status: DC
Start: ? — End: 2018-07-07

## 2018-07-07 MED ORDER — GLUCAGON HCL RDNA (DIAGNOSTIC) 1 MG IJ SOLR
1.00 | INTRAMUSCULAR | Status: DC
Start: ? — End: 2018-07-07

## 2018-07-07 MED ORDER — GENERIC EXTERNAL MEDICATION
1.00 | Status: DC
Start: ? — End: 2018-07-07

## 2018-07-07 MED ORDER — PREDNISONE 5 MG PO TABS
5.00 | ORAL_TABLET | ORAL | Status: DC
Start: 2018-07-08 — End: 2018-07-07

## 2018-07-07 MED ORDER — ACETAMINOPHEN 325 MG PO TABS
650.00 | ORAL_TABLET | ORAL | Status: DC
Start: ? — End: 2018-07-07

## 2018-07-07 MED ORDER — FLUTICASONE PROPIONATE 50 MCG/ACT NA SUSP
1.00 | NASAL | Status: DC
Start: 2018-07-08 — End: 2018-07-07

## 2018-07-07 MED ORDER — SALINE NASAL SPRAY 0.65 % NA SOLN
1.00 | NASAL | Status: DC
Start: 2018-07-07 — End: 2018-07-07

## 2018-07-07 MED ORDER — LOPERAMIDE HCL 2 MG PO CAPS
2.00 | ORAL_CAPSULE | ORAL | Status: DC
Start: ? — End: 2018-07-07

## 2018-07-10 DEATH — deceased

## 2018-11-03 ENCOUNTER — Ambulatory Visit: Payer: Medicare PPO | Admitting: FAMILY PRACTICE

## 2018-11-03 ENCOUNTER — Encounter: Payer: Self-pay | Admitting: FAMILY PRACTICE

## 2018-11-03 VITALS — BP 144/85 | HR 101 | Temp 99.5°F | Ht 63.98 in | Wt 154.5 lb

## 2018-11-03 DIAGNOSIS — Z1211 Encounter for screening for malignant neoplasm of colon: Secondary | ICD-10-CM

## 2018-11-03 DIAGNOSIS — F419 Anxiety disorder, unspecified: Secondary | ICD-10-CM

## 2018-11-03 DIAGNOSIS — R030 Elevated blood-pressure reading, without diagnosis of hypertension: Secondary | ICD-10-CM

## 2018-11-03 DIAGNOSIS — Z1231 Encounter for screening mammogram for malignant neoplasm of breast: Secondary | ICD-10-CM

## 2018-11-03 DIAGNOSIS — Z23 Encounter for immunization: Secondary | ICD-10-CM

## 2018-11-03 NOTE — Nursing Note (Signed)
Patient verified using 2 identifiers.  Vitals taken, screened for allergies, pain and tobacco use.  Pharmacy verified.  HM due: Mammogram, order pended. FIT card on counter. Order for Prevnar pended.  Libby Maw, MA    Patient WAS wearing a surgical mask  Contact precautions were followed when caring for the patient.   PPE used by provider during encounter: Surgical mask

## 2018-11-03 NOTE — Nursing Note (Signed)
Per orders of Dr. Oren Binet, injection of Pneumovax given to patient. VIS given to patient. Patient instructed to report any adverse reaction to me immediately. Verified by Mariana Single, LVN prior.

## 2018-11-03 NOTE — Progress Notes (Signed)
Date of service: 11/03/2018    CHIEF COMPLAINT: Amanda Lester is a 63yr old female who comes in today for stress, and to establish care.    SUBJECTIVE:  Presents to establish care.  Prior medical care: Amanda Lester. Worries about medical issues. History morning headache on days after stress. Occurring approximately once/wkposterior neck/heade, then around eyes. Going on x2-3 yrs. Positive tooth grinding at night. Retired from [employer] job with [employer] of [place]. Stress related to world events, personal events (e.g. subpoenaed for deposition). Socially and politically active. 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 Walking daily, meditating. "Internalize." Positive prior psychotherapy, not ongoing. Does not interfere with activities.   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 Pain intermittent. Midline lower lumbar, no radiating. Walking 5-6 mi/day.   Also with some missed preventive care-- last Pap approximately 15 yr ago, never had colonoscopy, mammogram.   Also with history White Coat syndrome. Blood pressure at home 116/72. Patient denies any exertional chest pain, dyspnea, palpitations, or syncope. No known history CAD.    Review of Systems   Constitutional: Negative for chills and fever.   HENT: Negative for nosebleeds and sore throat.    Eyes: Negative for visual disturbance.   Respiratory: Negative for shortness of breath.    Cardiovascular: Negative for chest pain, palpitations and leg swelling.   Gastrointestinal: Negative for abdominal pain, blood in stool, nausea and vomiting.   Endocrine: Negative for polyuria.   Genitourinary: Negative for difficulty urinating.   Musculoskeletal: Negative for joint swelling.   Skin: Negative for rash.   Neurological: Negative for dizziness and syncope.   Hematological: Negative for adenopathy.   Psychiatric/Behavioral: Negative for dysphoric mood and suicidal ideas.     OBJECTIVE:  BP 144/85 (SITE: left arm,  Orthostatic Position: sitting, Cuff Size: regular)   Pulse 101   Temp 37.5 C (99.5 F) (Temporal)   Ht 1.625 m (5' 3.98")   Wt 70.1 kg (154 lb 8.7 oz)   SpO2 98%   BMI 26.55 kg/m     Physical Exam  Constitutional:       General: She is not in acute distress.     Appearance: She is well-developed.   Eyes:      General: No scleral icterus.  Cardiovascular:      Rate and Rhythm: Normal rate and regular rhythm.      Heart sounds: Normal heart sounds. No murmur.   Pulmonary:      Breath sounds: Normal breath sounds. No wheezing or rales.   Chest:      Chest wall: No tenderness.   Skin:     General: Skin is warm and dry.      Findings: No rash.   Psychiatric:         Attention and Perception: Attention normal.         Mood and Affect: Mood is anxious (with apprehensive affect).         Speech: Speech normal.         Behavior: Behavior normal.         Thought Content: Thought content normal.         Cognition and Memory: Cognition normal.         Judgment: Judgment normal.       ASSESSMENT AND PLAN:  (F41.9) Anxiety  (primary encounter diagnosis)  Comment: Long-standing, unclear if rises to level of disorder, since causes dysphoria but apparently not dysfunction.  Plan:  TSH WITH FREE T4 REFLEX        Discussed treatment options. Consider psychotherapy; patient relu8ctant given recent negative experience. Consider medications if worsening; patient reluctant given prior negative experience. Call for new appointment if symptoms worsen or fail to resolve in the expected timeframe, or if new and concerning symtoms arise.     (Z12.31) Visit for screening mammogram  Comment:   Plan: BREAST MAMMOGRAM SCREENING W/CAD            (Z12.11) Screen for colon cancer  Comment:   Plan: FECAL BY IMMUNOASSAY (FIT)            (R03.0) Elevated blood pressure reading without diagnosis of hypertension  Comment: Suspect White Coat  Plan: TSH WITH FREE T4 REFLEX, COMPREHENSIVE         METABOLIC PANEL, CBC NO DIFFERENTIAL, LIPID          PANEL WITH DLDL REFLEX        Monitor at home, follow up 2 mos re-check, bring home logs and home machine      Ongoing medications at end of visit:  No current outpatient medications on file.  No current facility-administered medications for this visit.       I reviewed and entered patient's past medical and family/social history.  Barriers to Learning assessed: none. Patient verbalizes understanding of teaching and instructions.    Renato Battles, MD  Attending Physician, Family Medicine  [facility]  Electronically signed 11/03/2018 3:47 PM

## 2018-11-18 ENCOUNTER — Encounter: Payer: Self-pay | Admitting: FAMILY PRACTICE

## 2018-11-18 ENCOUNTER — Ambulatory Visit: Payer: Medicare PPO | Admitting: FAMILY PRACTICE

## 2018-11-18 VITALS — BP 132/78 | HR 92 | Ht 63.78 in | Wt 153.9 lb

## 2018-11-18 DIAGNOSIS — F411 Generalized anxiety disorder: Secondary | ICD-10-CM

## 2018-11-18 MED ORDER — SERTRALINE 25 MG TABLET
25.0000 mg | ORAL_TABLET | Freq: Every day | ORAL | 11 refills | Status: DC
Start: 2018-11-18 — End: 2018-12-09

## 2018-11-18 NOTE — Progress Notes (Signed)
Date of Service: 11/18/2018    Chief Complaint: Amanda Lester is a 63yr old female who comes in today for "stress"    Subjective:  Presents with previously elevated blood pressure. Blood pressure at home 109/69-125/78. Lots of stress related to husband's medical issues-- COPD, CVA, falls. Feels "always on alert." Worried that husband will get COVID. Sometimes awakening early a.m. with anxiety. "Horrid dreams." Usually sleeping well.      Review of Systems  See above-- integrated in Subjective section    Objective:  BP 132/78 (SITE: left arm, Orthostatic Position: sitting, Cuff Size: regular)   Pulse 92   Temp (!) -12.4 C (9.6 F) (Temporal)   Ht 1.62 m (5' 3.78")   Wt 69.8 kg (153 lb 14.1 oz)   SpO2 98%   BMI 26.60 kg/m     PHQ-2 11/18/2018   Interest 0   Feeling 0   PHQ-2 Total Score 0       Physical Exam  Constitutional:       General: She is not in acute distress.     Appearance: She is well-developed.   Eyes:      General: No scleral icterus.  Skin:     General: Skin is warm and dry.      Findings: No rash.   Psychiatric:         Attention and Perception: Attention normal.         Mood and Affect: Mood is anxious.         Speech: Speech normal.         Behavior: Behavior normal.         Thought Content: Thought content normal. Thought content does not include homicidal or suicidal plan.         Cognition and Memory: Memory is not impaired (Insight normal).         Judgment: Judgment normal.       ASSESSMENT AND PLAN:  (F41.1) GAD (generalized anxiety disorder)  (primary encounter diagnosis)  Comment: Mild, but impairing sleep, possibly imparing ability to weigh risks and benefits of various activities during [place].   Plan: Discussed nature of anxiety, natural history, mgmt options. Prescription sertraline 25mg  daily. Discussed common and dangerous side effects of medications, including risk of worsening depression, suicidal ideation. Discussed psychotherapy-- could be helpful for long-term mgmt.  Patient not interested at this time, will reconsider in future. Greater than 50% of visit time spent in counseling and/or coordination of care.  Visit greater than or equal to 25 min.       Ongoing medications at end of visit:  Sertraline (ZOLOFT) 25 mg Tablet, Take 1 tablet by mouth every morning.    No current facility-administered medications for this visit.       I did review patient's past medical and family/social history, no changes noted.  Barriers to Learning assessed: none. Patient verbalizes understanding of teaching and instructions.    Renato Battles, MD  Attending Physician, Family Medicine  [facility]  Electronically signed 11/18/2018 1:12 PM

## 2018-11-18 NOTE — Nursing Note (Signed)
Patient verified using 2 identifiers.  Vitals taken, screened for allergies, pain and tobacco use.  Pharmacy verified.  HM due: No MA action needed.  Rashun Grattan Wiley, MA

## 2018-11-24 ENCOUNTER — Ambulatory Visit: Payer: Self-pay

## 2018-12-02 ENCOUNTER — Ambulatory Visit: Payer: Medicare PPO | Attending: FAMILY PRACTICE

## 2018-12-02 DIAGNOSIS — F419 Anxiety disorder, unspecified: Secondary | ICD-10-CM | POA: Insufficient documentation

## 2018-12-02 DIAGNOSIS — R030 Elevated blood-pressure reading, without diagnosis of hypertension: Secondary | ICD-10-CM | POA: Insufficient documentation

## 2018-12-02 LAB — LIPID PANEL WITH DLDL REFLEX
Cholesterol: 254 mg/dL — ABNORMAL HIGH (ref 0–200)
HDL Cholesterol: 66 mg/dL (ref >=35)
LDL Cholesterol Calculation: 161 mg/dL — ABNORMAL HIGH (ref <130)
Non-HDL Cholesterol: 188 mg/dL — ABNORMAL HIGH (ref <=150)
Total Cholesterol: HDL Ratio: 3.8 (ref <4.0)
Triglyceride: 137 mg/dL (ref 35–160)

## 2018-12-02 LAB — COMPREHENSIVE METABOLIC PANEL
Alanine Transferase (ALT): 31 U/L (ref 5–54)
Albumin: 4 g/dL (ref 3.2–4.6)
Alkaline Phosphatase (ALP): 86 U/L (ref 35–115)
Aspartate Transaminase (AST): 25 U/L (ref 15–43)
Bilirubin Total: 0.7 mg/dL (ref 0.3–1.3)
Calcium: 9.2 mg/dL (ref 8.6–10.5)
Carbon Dioxide Total: 25 mmol/L (ref 24–32)
Chloride: 103 mmol/L (ref 95–110)
Creatinine Serum: 0.72 mg/dL (ref 0.44–1.27)
E-GFR Creatinine (Female): 88 mL/min/{1.73_m2}
E-GFR, African American (Female): >100 mL/min/{1.73_m2}
Glucose: 100 mg/dL — ABNORMAL HIGH (ref 70–99)
Potassium: 4 mmol/L (ref 3.3–5.0)
Protein: 6.8 g/dL (ref 6.3–8.3)
Sodium: 139 mmol/L (ref 135–145)
Urea Nitrogen, Blood (BUN): 11 mg/dL (ref 8–22)

## 2018-12-02 LAB — CBC NO DIFFERENTIAL
Hematocrit: 38.5 % (ref 36.0–46.0)
Hemoglobin: 13.2 g/dL (ref 12.0–16.0)
MCH: 30.5 pg (ref 27.0–33.0)
MCHC: 34.3 % (ref 32.0–36.0)
MCV: 89 fL (ref 80.0–100.0)
MPV: 8.8 fL (ref 6.8–10.0)
Platelet Count: 213 10*3/uL (ref 130–400)
RDW: 12.7 % (ref 0.0–14.7)
Red Blood Cell Count: 4.33 10*6/uL (ref 4.00–5.20)
White Blood Cell Count: 5 10*3/uL (ref 4.5–11.0)

## 2018-12-02 LAB — TSH WITH FREE T4 REFLEX: Thyroid Stimulating Hormone: 1.82 u[IU]/mL (ref 0.35–3.30)

## 2018-12-08 ENCOUNTER — Ambulatory Visit: Payer: Self-pay

## 2018-12-09 ENCOUNTER — Encounter: Payer: Self-pay | Admitting: FAMILY PRACTICE

## 2018-12-09 ENCOUNTER — Ambulatory Visit: Payer: Medicare PPO | Admitting: FAMILY PRACTICE

## 2018-12-09 VITALS — BP 132/76 | HR 88 | Temp 98.4°F | Wt 155.2 lb

## 2018-12-09 DIAGNOSIS — N951 Menopausal and female climacteric states: Secondary | ICD-10-CM

## 2018-12-09 DIAGNOSIS — F419 Anxiety disorder, unspecified: Secondary | ICD-10-CM | POA: Insufficient documentation

## 2018-12-09 NOTE — Patient Instructions (Signed)
Black Cohosh is an herbal medication for symptoms of menopause. It has been shown to be effective for some women, and is believed to be safe. As with all herbal medications, the optimal dose is unknown, as are side effects and interactions with other medications, so use with caution.  If there are any significant side effects to this medication you should make a return appointment.

## 2018-12-09 NOTE — Nursing Note (Signed)
Patient verified using 2 identifiers.  Vitals taken, screened for allergies, pain and tobacco use.  Pharmacy verified.  HM due: No MA action needed.   Patient WAS wearing a surgical mask  Standard precautions were followed when caring for the patient.   PPE used by provider during encounter: Surgical mask  Gabriela Irigoyen Wiley, MA

## 2018-12-09 NOTE — Progress Notes (Signed)
Date of Service: 12/09/2018; note started 11:07 AM    Chief Complaint: Amanda Lester is a 63yr old female who comes in today for anxiety    Subjective:  Presents with anxiety, here for follow up. Took Zoloft x1 day only, caused insomnia. Reading self-help books. Positive exercise, sleeping well. Positive meditation. "Feeling OK." Does not feel that anxiety is keeping her from doing anything.   Also with menopausal hot flashes, night sweats.     Review of Systems  See above-- integrated in Subjective section    Objective:  BP 132/76 (SITE: left arm, Orthostatic Position: sitting, Cuff Size: regular)   Pulse 88   Temp 36.9 C (98.4 F) (Temporal)   Wt 70.4 kg (155 lb 3.3 oz)   SpO2 97%   BMI 26.82 kg/m     PHQ-2 12/09/2018   Interest 0   Feeling 0   PHQ-2 Total Score 0       Physical Exam  Constitutional:       General: She is not in acute distress.     Appearance: She is well-developed.   Eyes:      General: No scleral icterus.  Skin:     General: Skin is warm and dry.      Findings: No rash.   Psychiatric:         Speech: Speech normal.         Thought Content: Thought content normal. Thought content does not include homicidal or suicidal plan.         Cognition and Memory: Memory is not impaired (Insight normal).         Judgment: Judgment normal.         Patient WAS wearing a surgical mask  Contact precautions were followed when caring for the patient.   PPE used by provider during encounter: Surgical mask     ASSESSMENT AND PLAN:  (F41.9) Anxiety  (primary encounter diagnosis)  Comment: Now controlled by lifestyle measures.   Plan: Stable on current treatment regimen.  Continue.  Contact office if any problems.     (N95.1) Menopausal syndrome  Comment:   Plan: Trial black cohosh. Call for new appointment if symptoms worsen or fail to resolve in the expected timeframe, or if new and concerning symtoms arise.       Ongoing medications at end of visit:  No current outpatient medications on file.  No current  facility-administered medications for this visit.       I did review patient's past medical and family/social history, no changes noted.  Barriers to Learning assessed: none. Patient verbalizes understanding of teaching and instructions.    Renato Battles, MD  Attending Physician, Family Medicine  [facility]  Electronically signed 12/09/2018 1:56 PM

## 2018-12-29 ENCOUNTER — Encounter: Payer: Self-pay | Admitting: FAMILY PRACTICE

## 2018-12-29 ENCOUNTER — Ambulatory Visit: Payer: Medicare PPO | Admitting: FAMILY PRACTICE

## 2018-12-29 VITALS — BP 121/75 | HR 82 | Temp 98.0°F | Wt 153.0 lb

## 2018-12-29 DIAGNOSIS — G8929 Other chronic pain: Secondary | ICD-10-CM

## 2018-12-29 DIAGNOSIS — F419 Anxiety disorder, unspecified: Secondary | ICD-10-CM

## 2018-12-29 DIAGNOSIS — M545 Low back pain, unspecified: Secondary | ICD-10-CM

## 2018-12-29 MED ORDER — ESCITALOPRAM 5 MG TABLET
5.0000 mg | ORAL_TABLET | Freq: Every day | ORAL | 11 refills | Status: DC
Start: 2018-12-29 — End: 2019-01-06

## 2018-12-29 NOTE — Nursing Note (Signed)
Patient verified using 2 identifiers.  Vitals taken, screened for allergies, pain and tobacco use.  Pharmacy verified.  HM due: FIT card on counter.  Libby Maw, MA    Patient WAS wearing a surgical mask  Standard precautions were followed when caring for the patient.   PPE used by provider during encounter: Surgical mask and face shield

## 2018-12-29 NOTE — Progress Notes (Signed)
Date of Service: 12/29/2018; note started 9:05 AM    Patient was wearing a CLOTH mask  Standard precautions were followed when caring for the patient.   PPE used by provider during encounter: Surgical mask and Face Shield/Goggles    Chief Complaint: Amanda Lester is a 63yr old female who comes in today for "Discuss anxiety"    Subjective:  Presents with deposition yesterday. "Made myself sick." "Nervous wreck." Wants to try medications again. Prior insomnia with sertraline. Anxiety does not stop her from doing things in everyday life. Positive anxiety related to husband illness, son's safety (lives in [place]). Mild anxiety coming to doctor's office.   Also with intermittent pain low back. Worse lying down, getting up from sitting down for a while. Worse touching toes. No radiating. Going on x approximately 1 yr. Brief when it occurs. Not interfering with activities.    Fall risks Assessment:   No falls in past year. No throw rugs, poorly lighted areas, missing rails/grab bars in the home. No medications or conditions that affect stability or mobility.      Review of Systems  See above-- integrated in Subjective section    Objective:  BP 121/75 (SITE: right arm, Orthostatic Position: sitting, Cuff Size: regular)   Pulse 82   Temp 36.7 C (98 F) (Temporal)   Wt 69.4 kg (153 lb)   SpO2 96%   BMI 26.44 kg/m     PHQ-2 12/29/2018   Interest 0   Feeling 0   PHQ-2 Total Score 0       Physical Exam  Constitutional:       General: She is not in acute distress.     Appearance: She is well-developed.   Eyes:      General: No scleral icterus.  Cardiovascular:      Rate and Rhythm: Normal rate and regular rhythm.      Heart sounds: Normal heart sounds. No murmur.   Pulmonary:      Breath sounds: Normal breath sounds. No wheezing or rales.   Chest:      Chest wall: No tenderness.   Musculoskeletal:      Lumbar back: She exhibits no tenderness and no deformity.   Skin:     General: Skin is warm and dry.      Findings: No  rash.   Neurological:      Deep Tendon Reflexes: Reflexes are normal and symmetric.      Comments: Negative straight leg raise bilateral   Psychiatric:         Speech: Speech normal.         Thought Content: Thought content normal. Thought content does not include homicidal or suicidal plan.         Cognition and Memory: Memory is not impaired (Insight normal).         Judgment: Judgment normal.       ASSESSMENT AND PLAN:  (F41.9) Anxiety  (primary encounter diagnosis)  Comment: Mild but persistent  Plan: Trial Lexapro, low dose. Discussed common and dangerous side effects of medications, including risk of worsening depression, suicidal ideation. Follow up 1 mo, Patient instructed to follow up earlier if she has problems with medications, or if new, unforseen, or worrisome problems develop.     (M54.5,  G89.29) Chronic left-sided low back pain without sciatica  Comment: Most consistent with degenerative disk disease/facet arthropathy  Plan: Physical Therapy Referral        Tylenol PRN.  Ongoing medications at end of visit:  Escitalopram (LEXAPRO) 5 mg Tablet, Take 1 tablet by mouth every day.    No current facility-administered medications for this visit.       I did review patient's past medical and family/social history, no changes noted.  Barriers to Learning assessed: none. Patient verbalizes understanding of teaching and instructions.    Renato Battles, MD  Attending Physician, Family Medicine  [facility]  Electronically signed 12/29/2018 11:01 AM

## 2019-01-03 ENCOUNTER — Ambulatory Visit: Payer: Self-pay | Admitting: FAMILY PRACTICE

## 2019-01-06 ENCOUNTER — Ambulatory Visit: Payer: Medicare PPO

## 2019-01-06 ENCOUNTER — Ambulatory Visit: Payer: Medicare PPO | Attending: FAMILY PRACTICE | Admitting: FAMILY PRACTICE

## 2019-01-06 VITALS — BP 138/82 | HR 90 | Temp 98.2°F | Wt 152.6 lb

## 2019-01-06 DIAGNOSIS — K625 Hemorrhage of anus and rectum: Secondary | ICD-10-CM

## 2019-01-06 DIAGNOSIS — F419 Anxiety disorder, unspecified: Secondary | ICD-10-CM

## 2019-01-06 DIAGNOSIS — K644 Residual hemorrhoidal skin tags: Secondary | ICD-10-CM | POA: Insufficient documentation

## 2019-01-06 LAB — CBC NO DIFFERENTIAL
Hematocrit: 36 % (ref 36.0–46.0)
Hemoglobin: 12.6 g/dL (ref 12.0–16.0)
MCH: 31.2 pg (ref 27.0–33.0)
MCHC: 35.1 % (ref 32.0–36.0)
MCV: 89 fL (ref 80.0–100.0)
MPV: 8.4 fL (ref 6.8–10.0)
Platelet Count: 214 10*3/uL (ref 130–400)
RDW: 13.2 % (ref 0.0–14.7)
Red Blood Cell Count: 4.04 10*6/uL (ref 4.00–5.20)
White Blood Cell Count: 4.1 10*3/uL — ABNORMAL LOW (ref 4.5–11.0)

## 2019-01-06 NOTE — Progress Notes (Signed)
Date of Service: 01/06/2019; note started 10:23 AM    Patient was wearing a CLOTH mask  Standard precautions were followed when caring for the patient.   PPE used by provider during encounter: Surgical mask and Face Shield/Goggles    Chief Complaint: Amanda Lester is a 63yr old female who comes in today for blood in stool    Subjective:  Presents with history hemorrhoids. Yesterday bright red blood in toilet. "Freaked me out." Stools with "tinge of pink" for several weeks. Subsequent normal stools. Scant blood this a.m. Has never had colonoscopy. No fever, chills, nausea, vomiting, night sweats, weight loss. Stools not hard, no pain with passage. Hemorrhoids diagnosed after pregnancy, when active mild pain, mild itch. Worse in last several weeks. Previously ordered FIT, does not feel that she will be able to do it due to anxiety. No melena. Patient denies any exertional chest pain, dyspnea, palpitations, or syncope. No dizziness with standing up.   Also with anxiety. Took Lexapro x4 days only, "felt like a zombie."     Review of Systems  See above-- integrated in Subjective section    Objective:  BP 138/82 (SITE: right arm, Orthostatic Position: sitting, Cuff Size: regular)   Pulse 90   Temp 36.8 C (98.2 F) (Temporal)   Wt 69.2 kg (152 lb 8.9 oz)   SpO2 98%   BMI 26.37 kg/m     PHQ-2 01/06/2019   Interest 0   Feeling 0   PHQ-2 Total Score 0       Physical Exam  Constitutional:       General: She is not in acute distress.     Appearance: She is well-developed.   Eyes:      General: No scleral icterus.  Cardiovascular:      Rate and Rhythm: Normal rate and regular rhythm.      Heart sounds: Normal heart sounds. No murmur.   Pulmonary:      Breath sounds: Normal breath sounds. No wheezing or rales.   Chest:      Chest wall: No tenderness.   Abdominal:      General: Bowel sounds are normal. There is no distension.      Palpations: Abdomen is soft. There is no mass.      Tenderness: There is no abdominal  tenderness. There is no rebound.   Genitourinary:     Rectum: External hemorrhoid (no visible wound or bleeding.) present.   Skin:     General: Skin is warm and dry.      Findings: No rash.   Psychiatric:         Mood and Affect: Mood is anxious.       ASSESSMENT AND PLAN:  (K62.5) BRBPR (bright red blood per rectum)  (primary encounter diagnosis)  Comment: Most likely hemorrhoidal bleeding, but has never had colonoscopy. No evidence of vascular instability.   Plan: CBC NO DIFFERENTIAL, GASTROENTEROLOGY REFERRAL            (F41.9) Anxiety  Comment: Discussed duration of therapy expected before positive or negative effect, though "zombie brain" is a possible side effects of Lexapro  Plan: Re-trial Lexapro, keep scheduled follow up 1 mo. If unable to tolerate consider alternative treatment.       Ongoing medications at end of visit:  Escitalopram (LEXAPRO) 5 mg Tablet, Take 1 tablet by mouth every day.    No current facility-administered medications for this visit.       I did review patient's past medical  and family/social history, no changes noted.  Barriers to Learning assessed: none. Patient verbalizes understanding of teaching and instructions.    Renato Battles, MD  Attending Physician, Family Medicine  [facility]  Electronically signed 01/06/2019 11:24 AM

## 2019-01-06 NOTE — Nursing Note (Signed)
Patient verified using 2 identifiers.  Vitals taken, screened for allergies, pain and tobacco use.  Pharmacy verified.  HM due: mammogram scheduled on 02/10/19, pt will request a colonoscopy referral.  Arna Medici, MA    Patient WAS wearing a surgical mask  Standard precautions were followed when caring for the patient.   PPE used by provider during encounter: Surgical mask and face shield

## 2019-01-11 ENCOUNTER — Telehealth: Payer: Self-pay | Admitting: FAMILY PRACTICE

## 2019-01-11 ENCOUNTER — Other Ambulatory Visit: Payer: Self-pay | Admitting: Gastroenterology

## 2019-01-11 ENCOUNTER — Encounter: Payer: Self-pay | Admitting: FAMILY PRACTICE

## 2019-01-11 DIAGNOSIS — Z01818 Encounter for other preprocedural examination: Secondary | ICD-10-CM

## 2019-01-11 MED ORDER — MOVIPREP 100 GRAM-7.5 GRAM-2.691 GRAM ORAL POWDER PACKET
ORAL | 0 refills | Status: DC
Start: 2019-01-11 — End: 2019-02-01

## 2019-01-11 NOTE — Telephone Encounter (Signed)
From: Kristopher Glee  To: Margaretha Seeds, MD  Sent: 01/11/2019 10:48 AM PDT  Subject: Referral Question    Hi Dr. Oren Binet. [facility] called me to let me know they do not take my medical insurance. Can you refer me to another PT?   Also I am still bleeding slightly and having lower stomach cramps each morning. I wonder how soon we can get the colonoscopy scheduled. Thanks for your help.

## 2019-01-11 NOTE — Telephone Encounter (Signed)
Amanda Lester with [facility] states thy are not in network with the patients health insurance.

## 2019-01-11 NOTE — Telephone Encounter (Signed)
Re-directed referral to [facility].     [facility]  [address]  [place], [place] [address]  (P) [phone]  [phone]    Renelda Mom, [place]  [phone]. [phone]      Informed patient via [place] as patient sent message.

## 2019-01-12 ENCOUNTER — Telehealth: Payer: Self-pay | Admitting: Gastroenterology

## 2019-01-12 NOTE — Telephone Encounter (Signed)
Pre procedure call: Left message on patient's voicemail regarding upcoming GI appointment. Instructed to read instructions and secure ride home. Reminded pt to attend covid-19 test appointment on 01/19/19 as it is required prior to appointment on 01/21/19. If any cough, fever above 100F (37.8), shortness of breath,  H/A, vomiting, diarrhea, abdominal pain or rash, cough, fever, shortness of breath, body aches, sore throat, chills, loss of taste or smell call [facility] until 4:30pm M-F. If after hours or weekends call Hospital operator at [phone] and ask for GI on call Physician. Patient is scheduled for colonoscopy on 01/21/19.    Maryln Eastham Cazares-Mariscal, LVN

## 2019-01-19 ENCOUNTER — Ambulatory Visit: Payer: Medicare PPO | Attending: Gastroenterology

## 2019-01-19 DIAGNOSIS — Z01818 Encounter for other preprocedural examination: Secondary | ICD-10-CM | POA: Insufficient documentation

## 2019-01-19 LAB — COVID-2019 RNA, QUAL: SARS-CoV-2: NOT DETECTED

## 2019-01-19 NOTE — Nursing Note (Signed)
The following procedures were done per physician's order: Patient was swabbed for Covid-19 testing.     (2) patient identifiers obtained.    Patient WAS wearing a surgical mask    Contact, Droplet and Airborne precautions were followed when caring for the patient.     PPE used during encounter: N95 mask, Face Shield/Goggles, Gown, and Gloves.    Swabbed both nostrils and patient tolerated procedure well.    Swabbed by Jurrell Royster, LVN    Ja Pistole, LVN

## 2019-01-20 NOTE — H&P (Signed)
Gastroenterology/Hepatology Pre-procedure History & Physical   IMMEDIATE PRE-SEDATION ASSESSMENT    Referring provider: Margaretha Seeds, MD  Procedure: colonoscopy    Attending: Luanna Salk, MD  Assistant Surgeon: None     SUBJECTIVE  HPI: Amanda Lester is a 63yr old female who presents for BRBPR.     No blood thinners.    Hemoglobin (g/dL)   Date Value   01/06/2019 12.6     Platelet Count   Date Value Ref Range Status   01/06/2019 214 130 - 400 K/MM3 Final     No results found for: INR  Creatinine Serum   Date Value Ref Range Status   12/02/2018 0.72 0.44 - 1.27 mg/dL Final       History:  No past medical history on file.  Past Surgical History:   Procedure Laterality Date    PR CESAREAN DELIVERY ONLY  1974    C-section, low cervical       Social History     Socioeconomic History    Marital status: Not on file     Spouse name: Not on file    Number of children: Not on file    Years of education: Not on file    Highest education level: Not on file   Occupational History    Not on file   Social Needs    Financial resource strain: Not on file    Food insecurity     Worry: Not on file     Inability: Not on file    Transportation needs     Medical: Not on file     Non-medical: Not on file   Tobacco Use    Smoking status: Former Smoker     Packs/day: 1.00     Years: 13.00     Pack years: 13.00     Last attempt to quit: 2014     Years since quitting: 6.6    Smokeless tobacco: Never Used   Substance and Sexual Activity    Alcohol use: Not Currently     Comment: Prior dependence, quit approx 2010    Drug use: Never    Sexual activity: Not Currently   Lifestyle    Physical activity     Days per week: Not on file     Minutes per session: Not on file    Stress: Not on file   Relationships    Social connections     Talks on phone: Not on file     Gets together: Not on file     Attends religious service: Not on file     Active member of club or organization: Not on file     Attends meetings of clubs or  organizations: Not on file     Relationship status: Not on file    Intimate partner violence     Fear of current or ex partner: Not on file     Emotionally abused: Not on file     Physically abused: Not on file     Forced sexual activity: Not on file   Other Topics Concern    Not on file   Social History Narrative    Not on file       Review of Systems  Otherwise a complete review of system was completed and was negative except for what was noted above in the history of present illness.    I did review all available medical, surgical, personal/social history.    OBJECTIVE  Physical Exam:   General Appearance: No acute distress  Eyes: conjunctivae and corneas sclerae normal.  Mouth: mucous membranes moist   Neck: Neck supple.  Lungs: Breathing without difficult or use of accessory muscles  Abdomen: BS normal.  Abdomen soft, non-tender.  No masses or organomegaly.   Extremities: warm, no pitting edema, 2+ radial pulses   Skin: no rashes.      Labs/Imaging:   Lab Results   Lab Name Value Date/Time    WBC 4.1 (L) 01/06/2019 01:23 PM    HGB 12.6 01/06/2019 01:23 PM    HCT 36.0 01/06/2019 01:23 PM    PLT 214 01/06/2019 01:23 PM       Performed at [facility], reviewed:  See computer database.  Performed outside of [place] (reviewed if applicable):  N/A    ASSESSMENT / PLAN:  colonoscopy    Per patient, may discuss bx/procedure results with self and/or leave message on voicemail at home.    Pre-Sedation/Anesthesia Assessment:    Patient is a candidate for moderate sedation.   Patient is ASA status: 1 - Normal healthy patient    Airway Assessment:    Normal, Mallampati class 2, and Mouth opening: normal  ROM normal      The procedure, risks, benefits, and alternatives were explained.  All patient questions were answered.  The informed consent was signed.    Patient barriers to learning: None     Patient/family understanding: verbalizes     Luanna Salk, MD

## 2019-01-21 ENCOUNTER — Encounter: Payer: Self-pay | Admitting: Gastroenterology

## 2019-01-21 ENCOUNTER — Ambulatory Visit
Admission: RE | Admit: 2019-01-21 | Discharge: 2019-01-21 | Disposition: A | Discharge status: Home / Self Care | Payer: Medicare PPO | Source: Ambulatory Visit | Attending: Gastroenterology | Admitting: Gastroenterology

## 2019-01-21 ENCOUNTER — Other Ambulatory Visit: Payer: Self-pay | Admitting: Gastroenterology

## 2019-01-21 DIAGNOSIS — D122 Benign neoplasm of ascending colon: Secondary | ICD-10-CM

## 2019-01-21 DIAGNOSIS — K5730 Diverticulosis of large intestine without perforation or abscess without bleeding: Secondary | ICD-10-CM

## 2019-01-21 DIAGNOSIS — K648 Other hemorrhoids: Secondary | ICD-10-CM | POA: Insufficient documentation

## 2019-01-21 DIAGNOSIS — K625 Hemorrhage of anus and rectum: Secondary | ICD-10-CM | POA: Insufficient documentation

## 2019-01-21 DIAGNOSIS — D120 Benign neoplasm of cecum: Secondary | ICD-10-CM

## 2019-01-21 DIAGNOSIS — K635 Polyp of colon: Secondary | ICD-10-CM

## 2019-01-21 HISTORY — PX: PR COLONOSCOPY FLX W/ENDOSCOPIC MUCOSAL RESECTION: 45390

## 2019-01-21 MED ORDER — LACTATED RINGERS IV INFUSION
INTRAVENOUS | Status: DC
Start: 2019-01-21 — End: 2019-01-27
  Administered 2019-01-21 (×2): via INTRAVENOUS

## 2019-01-21 MED ORDER — MIDAZOLAM (PF) 1 MG/ML INJECTION SOLUTION
0.5000 mg | INTRAMUSCULAR | Status: DC | PRN
Start: 2019-01-21 — End: 2019-01-27
  Administered 2019-01-21: 8 mg via INTRAVENOUS
  Filled 2019-01-21: qty 10

## 2019-01-21 MED ORDER — EPINEPHRINE 0.1 MG/ML INJECTION SYRINGE
0.3000 mg | INJECTION | INTRAMUSCULAR | Status: DC | PRN
Start: 2019-01-21 — End: 2019-01-27
  Filled 2019-01-21: qty 10

## 2019-01-21 MED ORDER — DIPHENHYDRAMINE 50 MG/ML INJECTION SOLUTION
12.5000 mg | INTRAMUSCULAR | Status: DC | PRN
Start: 2019-01-21 — End: 2019-01-27
  Administered 2019-01-21: 37.5 mg via INTRAVENOUS
  Filled 2019-01-21: qty 1

## 2019-01-21 MED ORDER — EPINEPHRINE 0.1 MG/ML INJECTION SYRINGE
1.0000 mg | INJECTION | INTRAMUSCULAR | Status: DC | PRN
Start: 2019-01-21 — End: 2019-01-27

## 2019-01-21 MED ORDER — NALOXONE 0.4 MG/ML INJECTION SOLUTION
0.4000 mg | INTRAMUSCULAR | Status: DC | PRN
Start: 2019-01-21 — End: 2019-01-27
  Filled 2019-01-21: qty 1

## 2019-01-21 MED ORDER — FLUMAZENIL 0.1 MG/ML INTRAVENOUS SOLUTION
0.2000 mg | INTRAVENOUS | Status: DC | PRN
Start: 2019-01-21 — End: 2019-01-27
  Filled 2019-01-21: qty 5

## 2019-01-21 MED ORDER — FENTANYL (PF) 50 MCG/ML INJECTION SOLUTION
12.5000 ug | INTRAMUSCULAR | Status: DC | PRN
Start: 2019-01-21 — End: 2019-01-27
  Administered 2019-01-21: 150 ug via INTRAVENOUS
  Filled 2019-01-21: qty 4

## 2019-01-21 MED ORDER — ATROPINE 0.1 MG/ML INJECTION SYRINGE
0.5000 mg | INJECTION | INTRAMUSCULAR | Status: DC | PRN
Start: 2019-01-21 — End: 2019-01-27
  Filled 2019-01-21: qty 10

## 2019-01-21 MED ORDER — SIMETHICONE 40 MG/0.6 ML ORAL DROPS,SUSPENSION
40.0000 mg | Freq: Once | ORAL | Status: AC
Start: 2019-01-21 — End: 2019-01-21
  Administered 2019-01-21: 40 mg
  Filled 2019-01-21: qty 0.6

## 2019-01-21 MED ORDER — EPINEPHRINE 0.1 MG/ML INJECTION SYRINGE
0.3000 mg | INJECTION | INTRAMUSCULAR | Status: DC | PRN
Start: 2019-01-21 — End: 2019-01-27

## 2019-01-21 NOTE — Discharge Instructions (Signed)
Discharge Instructions for colonoscopy    Findings:  Colonoscopy  -4 polyp(s) removed  -hemorrhoids  -diverticulosis    Activity:    Rest today, resume usual activitiy tomorrow     Diet:    Resume usual diet     Medication:    Resume all usual medications    Follow up:   We will notify you of your biopsy results via phone call or letter.If you do not receive notice of your results by three weeks, please call us at the phone number provided below.      During your procedure, air was pumped into your GI tract so your doctor could see clearly to make a diagnosis and/or treat your problem.     Some possible side effects you may experience are:   - Discomfort due to a distended (bloated) abdomen which will subside after a few hours to two days.   - Nausea may be a side effect of the medication and will subside.   - The medications you received may make you dizzy and sleepy, it is important that you do not drive, operate machinery or drink alcohol for at least one day.   - Severe pain is not expected and should be reported    Other side effects may include:  Flex. Sig. Or Colonoscopy: A small amount of diarrhea may follow the exam.    If problems, call [facility] at [phone] during business hours Monday through Friday 7:30am to 4:30 pm.  After hours call [phone] and ask to speak to the GI Fellow on call.     Hemorrhoids    The term hemorrhoids refers to a condition in which the veins around the anus or lower rectum are swollen and inflamed.  Hemorrhoids may result from straining to move stool. Other contributing factors include pregnancy, aging, chronic constipation or diarrhea, and anal intercourse.  Hemorrhoids are either inside the anus (internal) or under the skin around the anus (external).     What are the symptoms of hemorrhoids?    Many anorectal problems, including fissures, fistulae, abscesses, or irritation and itching (pruritus ani), have similar symptoms and are incorrectly referred to as  hemorrhoids.  Hemorrhoids usually are not dangerous or life threatening. In most cases, hemorrhoidal symptoms will go away within a few days.      Although many people have hemorrhoids, not all experience symptoms. The most common symptom of internal hemorrhoids is bright red blood covering the stool, on toilet paper, or in the toilet bowl. However, an internal hemorrhoid may protrude through the anus outside the body, becoming irritated and painful. This is known as a protruding hemorrhoid.    Symptoms of external hemorrhoids may include painful swelling or a hard lump around the anus that results when a blood clot forms. This condition is known as a thrombosed external hemorrhoid.     In addition, excessive straining, rubbing, or cleaning around the anus may cause irritation with bleeding and/or itching, which may produce a vicious cycle of symptoms. Draining mucus may also cause itching.     How common are hemorrhoids?    Hemorrhoids are very common in both men and women. About half of the population have hemorrhoids by age 50. Hemorrhoids are also common among pregnant women. The pressure of the fetus in the abdomen, as well as hormonal changes, cause the hemorrhoidal vessels to enlarge. These vessels are also placed under severe pressure during childbirth. For most women, however, hemorrhoids caused by pregnancy   are a temporary problem.    How are hemorrhoids diagnosed?    A thorough evaluation and proper diagnosis by the doctor is important any time bleeding from the rectum or blood in the stool occurs. Bleeding may also be a symptom of other digestive diseases, including colorectal cancer.    The doctor will examine the anus and rectum to look for swollen blood vessels that indicate hemorrhoids and will also perform a digital rectal exam with a gloved, lubricated finger to feel for abnormalities.     Closer evaluation of the rectum for hemorrhoids requires an exam with an anoscope, a hollow, lighted tube  useful for viewing internal hemorrhoids, or a proctoscope, useful for more completely examining the entire rectum.    To rule out other causes of gastrointestinal bleeding, the doctor may examine the rectum and lower colon (sigmoid) with sigmoidoscopy or the entire colon with colonoscopy. Sigmoidoscopy and colonoscopy are diagnostic procedures that also involve the use of lighted, flexible tubes inserted through the rectum.     What is the treatment?    Medical treatment of hemorrhoids is aimed initially at relieving symptoms. Measures to reduce symptoms include tub baths several times a day in plain, warm water for about 10 minutes and application of a hemorroidal cream or suppository to the affected area for a limited time.    Preventing the recurrence of hemorrhoids will require relieving the pressure and straining of constipation. Doctors will often recommend increasing fiber and fluids in the diet. Eating the right amount of fiber and drinking six to eight glasses of fluid (not alcohol) result in softer, bulkier stools. A softer stool makes emptying the bowels easier and lessens the pressure on hemorrhoids caused by straining. Eliminating straining also helps prevent the hemorrhoids from protruding.    Good sources of fiber are fruits, vegetables, and whole grains. In addition, doctors may suggest a bulk stool softener or a fiber supplement such as psyllium (Metamucil) or methylcellulose (Citrucel).    In some cases, hemorrhoids must be treated endoscopically or surgically. These methods are used to shrink and destroy the hemorrhoidal tissue. The doctor will perform the procedure during an office or hospital visit.    A number of methods may be used to remove or reduce the size of internal hemorrhoids. The most common is rubber band ligation in which a rubber band is placed around the base of the hemorrhoid inside the rectum. The band cuts off circulation, and the hemorrhoid withers away within a few days.      How are hemorrhoids prevented?    The best way to prevent hemorrhoids is to keep stools soft so they pass easily, thus decreasing pressure and straining, and to empty bowels as soon as possible after the urge occurs. Exercise, including walking, and increased fiber in the diet help reduce constipation and straining by producing stools that are softer and easier to pass.         Diverticulosis / Diverticulitis    Many people have small pouches in their colons that bulge outward through weak spots, like an inner tube that pokes through weak places in a tire. Each pouch is called a diverticulum. Pouches (plural) are called diverticula. The condition of having diverticula is called diverticulosis. About 10 percent of Americans over the age of 40 have diverticulosis. The condition becomes more common as people age. About half of all people over the age of 60 have diverticulosis.    When the pouches become infected or inflamed, the condition   is called diverticulitis. This happens in 10 to 25 percent of people with diverticulosis. Diverticulosis and diverticulitis are also called diverticular disease.    What are the symptoms?    Diverticulosis  Most people with diverticulosis do not have any discomfort or symptoms. However, symptoms may include mild cramps, bloating, and constipation. Other diseases such as irritable bowel syndrome (IBS) and stomach ulcers cause similar problems, so these symptoms do not always mean a person has diverticulosis. You should visit your doctor if you have these troubling symptoms.    Diverticulitis  Diverticulitis occurs when diverticula become infected or inflamed. Doctors are not certain what causes the infection. It may begin when stool or bacteria are caught in the diverticula. An attack of diverticulitis can develop suddenly and without warning.  The most common symptom of diverticulitis is abdominal pain. The most common sign is tenderness around the left side of the lower abdomen. If  infection is the cause, fever, nausea, vomiting, chills, cramping, and constipation may occur as well. The severity of symptoms depends on the extent of the infection and complications.    What are the complications?    Diverticulitis can lead to bleeding, infections, perforations or tears, or blockages. These complications always require treatment to prevent them from progressing and causing serious illness.    Bleeding  Bleeding from diverticula is a rare complication. When diverticula bleed, blood may appear in the toilet or in your stool. Bleeding can be severe, but it may stop by itself and not require treatment. Doctors believe bleeding diverticula are caused by a small blood vessel in a diverticulum that weakens and finally bursts. If you have bleeding from the rectum, you should see your doctor. If the bleeding does not stop, surgery may be necessary.    Abscess, Perforation, & Peritonitis  The infection causing diverticulitis often clears up after a few days of treatment with antibiotics. If the condition gets worse, an abscess may form in the colon.  An abscess is an infected area with pus that may cause swelling and destroy tissue. Sometimes the infected diverticula may develop small holes, called perforations. These perforations allow pus to leak out of the colon into the abdominal area. If the abscess is small and remains in the colon, it may clear up after treatment with antibiotics. If the abscess does not clear up with antibiotics, the doctor may need to drain it.    A large abscess can become a serious problem if the infection leaks out and contaminates areas outside the colon. Infection that spreads into the abdominal cavity is called peritonitis. Peritonitis requires immediate surgery to clean the abdominal cavity and remove the damaged part of the colon. Without surgery, peritonitis can be fatal.    Intestinal Obstruction  The scarring caused by infection may cause partial or total blockage of  the large intestine. When this happens, the colon is unable to move bowel contents normally. When the obstruction totally blocks the intestine, emergency surgery is necessary. Partial blockage is not an emergency, so the surgery to correct it can be planned.    What causes diverticular disease?    Although not proven, the dominant theory is that a low-fiber diet is the main cause of diverticular disease. The disease was first noticed in the [place] in the early 1900s. At about the same time, processed foods were introduced into the American diet. Many processed foods contain refined, low-fiber flour. Unlike whole-wheat flour, refined flour has no wheat bran.    Diverticular   Diverticular disease is common in developed or industrialized countries-particularly the [place], [place], and [place]-where low-fiber diets are common. The disease is rare in countries of [place] and [place], where people eat high-fiber vegetable diets.  Fiber is the part of fruits, vegetables, and grains that the body cannot digest. Some fiber dissolves easily in water (soluble fiber). It takes on a soft, jelly-like texture in the intestines. Some fiber passes almost unchanged through the intestines (insoluble fiber). Both kinds of fiber help make stools soft and easy to pass. Fiber also prevents constipation.    Constipation makes the muscles strain to move stool that is too hard. It is the main cause of increased pressure in the colon. This excess pressure might cause the weak spots in the colon to bulge out and become diverticula.    What is the treatment of diverticular disease?    A high-fiber diet and, occasionally, mild pain medications will help relieve symptoms in most cases. Sometimes an attack of diverticulitis is serious enough to require a hospital stay and possibly surgery.    Increasing the amount of fiber in the diet may reduce symptoms of diverticulosis and prevent complications such as diverticulitis. Fiber keeps stool soft and  lowers pressure inside the colon so that bowel contents can move through easily. The doctor may also recommend taking a fiber product such as Citrucel or Metamucil once a day. These products are mixed with water and provide about 2 to 3.5 grams of fiber per tablespoon, mixed with 8 ounces of water.    Until recently, many doctors suggested avoiding foods with small seeds such as tomatoes or strawberries because they believed that particles could lodge in the diverticula and cause inflammation. However, it is now generally accepted that only foods that may irritate or get caught in the diverticula cause problems. Foods such as nuts, popcorn hulls, and sunflower, pumpkin, caraway, and sesame seeds should be avoided. The seeds in tomatoes, zucchini, cucumbers, strawberries, and raspberries, as well as poppy seeds, are generally considered harmless. People differ in the amounts and types of foods they can eat. Decisions about diet should be made based on what works best for each person. Keeping a food diary may help identify individual items in one's diet.    When is surgery necessary?    If attacks are severe or frequent, the doctor may advise surgery. The surgeon removes the affected part of the colon and joins the remaining sections. This type of surgery, called colon resection, aims to keep attacks from coming back and to prevent complications. The doctor may also recommend surgery for complications of a fistula or intestinal obstruction.    If antibiotics do not correct an attack, emergency surgery may be required. Other reasons for emergency surgery include a large abscess, perforation, peritonitis, or continued bleeding.    Emergency surgery usually involves two operations. The first surgery will clear the infected abdominal cavity and remove part of the colon. Because of infection and sometimes obstruction, it is not safe to rejoin the colon during the first operation. Instead, the surgeon creates a temporary  hole, or stoma, in the abdomen. The end of the colon is connected to the hole, a procedure called a colostomy, to allow normal eating and bowel movements. The stool goes into a bag attached to the opening in the abdomen. In the second operation, the surgeon rejoins the ends of the colon.  Colon Polyps    A polyp is extra tissue that grows inside your body. Colon   polyps grow in the large intestine.  The large intestine, also called the colon, is part of your digestive system. It's a long, hollow tube at the end of your digestive tract where your body makes and stores stool.    Are polyps dangerous?    Most polyps are not dangerous. Most are benign which means they are not cancer. But over time, some types of polyps can turn into cancer. Usually, polyps that are smaller than a pea aren't harmful. But larger polyps could someday become cancer or may already be cancer. There are two common types of polyps, hyperplastic and adenomatous polyps.  Hyperplastic polyps generally remain small and are not precancerous.  Adenomatous polyps or adenomas, have the potential to grow large and become cancerous.  To be safe, doctors remove all polyps and test them to determine what type they are.    Who gets polyps?    Anyone can get polyps, but certain people are more likely than others. You may have a greater chance of getting polyps if you're over 50, you've had polyps before, someone in your family has had polyps, someone in your family has had cancer of the large intestine.   You may also be more likely to get polyps if you eat a lot of fatty foods, smoke, drink alcohol, don't exercise, or weigh too much.    What are the symptoms?    Most small polyps don't cause symptoms. Often, people don't know they have one until the doctor finds it during a regular checkup or while testing them for something else.  Some people do have symptoms including bleeding from the anus, constipation or diarrhea that lasts more than a week, blood in  the stool. Blood can make stool look black, or it can show up as red streaks in the stool.  If you have any of these symptoms, see a doctor to find out what the problem is.    How does the doctor test for polyps?    The doctor can use four tests to check for polyps:    Digital rectal exam. The doctor wears gloves and checks your rectum, the last part of the large intestine, to see if it feels normal. This test would find polyps only in the rectum, so the doctor may need to do one of the other tests listed below to find polyps higher up in the intestine.    Barium enema. The doctor puts a liquid called barium into your rectum before taking x rays of your large intestine. Barium makes your intestine look white in the pictures. Polyps are dark, so they're easy to see.    Sigmoidoscopy:  With this test, the doctor can see inside your large intestine. The doctor puts a thin flexible tube into your rectum. The device is called a sigmoidoscope, and it has a light and a tiny video camera in it. The doctor uses the sigmoidoscope to look at the last third of your large intestine.    Colonoscopy:  This test is like sigmoidoscopy, but the doctor looks at all of the large intestine. It usually requires sedation.    Who should get tested for polyps?    Talk to your doctor about getting tested for polyps if you have symptoms, you're 50 years old or older, someone in your family has had polyps or colon cancer.    How are polyps treated?    The doctor will remove the polyp. Sometimes, the doctor takes it out during sigmoidoscopy or   colonoscopy. Or the doctor may decide to operate through the abdomen.  The polyp is then tested for cancer.  If you've had polyps, the doctor may want you to get tested regularly in the future.    How can I prevent polyps?    Doctors don't know of any one sure way to prevent polyps. But you might be able to lower your risk of getting them if you eat more fruits and vegetables and less fatty food, don't  smoke, avoid alcohol, exercise every day, lose weight if you're overweight.      Eating more calcium and folate can also lower your risk of getting polyps. Some foods that are rich in calcium are milk, cheese, and broccoli. Some foods that are rich in folate are chickpeas, kidney beans, and spinach.  Some doctors think that aspirin might help prevent polyps. Studies are under way.     Points to Remember   A polyp is extra tissue that grows inside the body. Most polyps are not harmful.   Symptoms may include constipation or diarrhea for more than a week or blood                 on your underwear, on toilet paper, or in your stool.   Many polyps do not cause symptoms.   Doctors remove all polyps and test them for cancer.   Talk to your doctor about getting tested for polyps if              o you have any symptoms              o you're 50 years old or older              o someone in your family has had polyps or colon cancer

## 2019-01-21 NOTE — Procedures (Addendum)
PROCEDURE NOTE    Attestation:  ID verified by two sources: DOB and Name  Was this an emergency procedure?  no      I attest that I verified the following information prior to performing the procedure: Patient ID, Site and Procedure     Patient Name:  Amanda Lester  Location: [facility]  MRN: [id number]   DOB: [birth year]  DOS: 01/21/2019  Age: 39yr  Sex: female       Procedure: Colonoscopy  Subprocedure: endoscopic mucosal resection       Referring Physician: PCP: Margaretha Seeds, MD    PERFORMING SURGEON: Luanna Salk, MD  ASSISTANT SURGEON: None    Indication:   hematochezia    Sedation:  Versed 8 mg IV, Fentanyl 150 mcg IV and Benadryl 37.5 mg IV    Sedation was administered for single procedure/procedures    I was the responsible provider supervising the administration of moderate sedation to this patient by a trained independent provider (RN). I was present during the interservice time from initial administration of sedatives until the end of the procedure.    Sedation Start: [place]  Time Start:  0911     Cecal Time: 0921   Time end: 0954    Details of the Procedure:    Informed consent was obtained for the procedure, including sedation.  Risks of perforation, hemorrhage, adverse drug reaction, pain, infection, aspiration, missed lesion or diagnosis, and cardiopulmonary arrest were discussed.  The patient was placed in the left lateral decubitus position. The patient was monitored continuously with EKG tracing, pulse oximetry, blood pressure monitoring, and direct observations.      A rectal examination was performed.  The Olympus Pediatric colonoscope was inserted into the rectum and advanced under direct vision to the terminal ileum.  The quality of the colonic preparation was scored as Boston Bowel Preparation Score of 2+3+3. As the endoscope was withdrawn care was taken to expose and inspect the proximal sides of the IC valve, the haustral folds, flexures, and rectal valves. The ascending colon was  evaluated twice. Retroflexed view of the rectum was done. Findings and interventions are described below.  Photo documentation was obtained.  The patient tolerated the procedure well, and there were not complications.  She was taken to the recovery area in stable condition.         Findings and Interventions:    1) Rectal Exam: Digital rectal exam did not reveal any masses or strictures.    2) Retroflexion: There were large internal hemorrhoids which were left undisturbed.    3) Terminal ileum was normal for the examined extent of 5 cm proximal to ileocecal valve.    4) In the cecum, there were two 3-6 mm sessile polyps (Paris 0-Is) which were completely resected by cold snare polypectomy and retrieved.     5) In the cecum contralateral to the ileocecal valve, there was a 13 mm sessile polyp (Paris 0-Is) which was completely resected en bloc by endoscopic mucosal resection (5cc eleview injected to lift lesion, braided cold snare polypectomy) and retrieved.     6) In the ascending colon, there was a 5 mm sessile polyp (Paris 0-Is) which was completely resected by cold snare polypectomy and retrieved.     7) There was mild diverticulosis in right colon and moderate-severe diverticulosis in the left colon with associated tortuosity.    8) There was erythema adjacent to diverticulosis in the sigmoid colon which was biopsied.    9) The  exam was otherwise normal.    Impression:  1) Four polyps resected.  2) Diverticulosis, moderate-severe in the left colon with erythema which may represent bowel preparation artifact or segmental colitis associated with diverticula  3) Hemorrhoids this is the likely etiology of hematochezia.          Recommendation/Plan:  1) Await pathology.          Report Electronically Signed By: Luanna Salk, MD Gastroenterology Attending    A.        COLON, CECUM, POLYP x 2 (4-47mm) (SNARE POLYPECTOMY):  -   Fragments of tubular adenomas          B.        COLON, CECUM, POLYP x 1 (5mm) (SNARE  POLYPECTOMY):  -   Fragments of sessile serrated adenoma        C.        COLON, ASCENDING, POLYP x 1 (51mm) (SNARE POLYPECTOMY):  -   Fragments of sessile serrated adenoma        D.        COLON, SIGMOID (BIOPSY):  -   Focally active diverticular disease associated colitis (see Comment)    COMMENT: The endoscopical impression of moderate-severe diverticulosis in the left colon is noted.      I would recommend that colonoscopy be repeated in 3 years.

## 2019-01-21 NOTE — Nurse Focus (Signed)
Amount of time spent educating patient: 30 minutes    Education                 Title: Endoscopy (Resolved)     Topic: Overview (Resolved)     Point: Indications (Resolved)     Description:   Indications for a GI endoscopy may include:       biopsy    difficulty swallowing    gastric or esophageal ulcer    GI bleeding    unexplained anemia    persistent heartburn    persistent vomiting    persistent reflux symptoms despite therapy    cancer screening    abnormal radiologic finding    foreign body removal    diagnostic for inflammatory bowel disease    Focus education on patient-specific indication.           Patient Friendly Description:   There are many reasons to have this procedure.       The doctor will talk to you about why it is important for you.    Please ask questions if you do not understand.    Learning Progress Summary           Patient Acceptance, E, VU by MW at 01/21/2019 0805                   Point: Procedure Review (Resolved)     Description:   Gastrointestinal (GI) endoscopy is a procedure to examine the lining of the esophagus, stomach, upper small intestine, colon or rectum.       A flexible, lighted fiber optic scope is used.    Review anticipated anesthesia type.    Post-procedure care will include:    monitoring during sedation recovery    IV fluid    gradual diet advancement           Patient Friendly Description:   A gastrointestinal (GI) endoscopy is a long, flexible tube.       It looks at the lining of parts of your digestive system.    It is put down the throat or in your bottom.    A light and a tiny camera is at the end of the scope. Other special tools can be used through a hole in the scope.    Medicine will help make you sleepy and comfortable.    Learning Progress Summary           Patient Acceptance, E, VU by MW at 01/21/2019 0805                         Topic: Self-Management (Resolved)     Point: Activity (Resolved)     Description:   Review post-discharge activity restrictions  which may include:       driving    work/school    Someone should be with the patient to make sure he/she is safe until fully recovered from sedation.           Patient Friendly Description:   Follow the doctor's instructions for activity.       Rest for today. Do not try to do too much.    There may be limits set for:    work, school    playing sports, exercise    driving as applicable    use of tools or appliances    signing of legal papers    Someone should stay with you until you are safe  or have completely recovered from sedation.    Learning Progress Summary           Patient Acceptance, E, VU by MW at 01/21/2019 0805                   Point: Fluid/Food Intake (Resolved)     Description:   Encourage advancing diet gradually once swallowing has returned to normal and patient is alert.              Patient Friendly Description:   After the procedure, your throat might be numb. When you can take small sips of water without coughing, you may start eating and drinking.       Learning Progress Summary           Patient Acceptance, E, VU by MW at 01/21/2019 0805                   Point: Resuming Home Medication (Resolved)     Description:   Resume regular medications as directed by provider after procedure.       Patient may need to hold anticoagulants, sedatives and narcotics for a specified amount of time.           Patient Friendly Description:   The doctor will tell you when it is safe to take home medicines.       Depending on the procedure, you may be asked to wait to take some medicines again.    This might include:    blood thinners    pain medicine    medicines that help you relax    Learning Progress Summary           Patient Acceptance, E, VU by MW at 01/21/2019 0805                   Point: Promote Flatus Passing (Resolved)     Description:   Encourage gas expulsion and alternating positions to facilitate.              Patient Friendly Description:   Air was used to help see better during the procedure.        Extra air can give your child/you stomach pain after the procedure.    It will be important to pass gas and burp the extra air out. Walking and changing position helps move air up or down.    Right side-lying and fetal position (knees to the chest) might help with comfort.    Learning Progress Summary           Patient Acceptance, E, VU by MW at 01/21/2019 0805                         Topic: When to Seek Medical Attention (Resolved)     Point: Nausea and Vomiting (Resolved)     Description:   Provider (specify) should be called if:       unable to tolerate food, drink or medication    loss of appetite    symptoms do not resolve    Immediate medical care should be sought if:    forceful vomiting    blood in stool or vomit    symptoms become worse           Patient Friendly Description:   Do not force yourself to eat, but try to sip some clear liquids.       Call the doctor if:  you can't keep food or drink down without getting sick    you don't have an appetite    you do not feel better    Get medical care right away if:    you see blood in your poop (stool) or vomit    you feel worse    Learning Progress Summary           Patient Acceptance, E, VU by MW at 01/21/2019 0805                   Point: Sleepiness, Persistent (Resolved)     Description:   Provider (specify) should be called if:       continued sedation effects the next day    symptoms do not resolve    Immediate medical care should be sought if:    difficult to arouse/change in level of consciousness    symptoms become worse           Patient Friendly Description:   Call the doctor if:       you have a hard time waking up the next day    symptoms do not get better    Learning Progress Summary           Patient Acceptance, E, VU by MW at 01/21/2019 0805                               User Key     Initials Effective Dates Name Provider Type Discipline    MW 04/12/18 -  Holley Bouche, RN .NURSE: (RN or LVN) nurse

## 2019-01-21 NOTE — Nurse Focus (Signed)
NURSING PRE-SEDATION ASSESSMENT    Amanda Lester arrived by ambulating into clinic today at 0750 from home.      Patient has arranged for a ride home from [facility] who can be contacted at [phone]. Instructed patient that post sedation, they are not to drive or drink alcohol, sign any important/legal documents or make any important decisions for the remainder of the day and patient verbalized understanding.      Identification band placed on and two forms of identification information verified: yes    Verified procedure with the patient. yes    NPO since: liquid-0500; solid-Wednsday    Pt completed bowel prep: Yes      Past Surgical History:   Procedure Laterality Date    PR CESAREAN DELIVERY ONLY  1974    C-section, low cervical       Social History     Tobacco Use    Smoking status: Former Smoker     Packs/day: 1.00     Years: 13.00     Pack years: 13.00     Last attempt to quit: 2014     Years since quitting: 6.6    Smokeless tobacco: Never Used   Substance Use Topics    Alcohol use: Not Currently     Comment: Prior dependence, quit approx 2010       Social History     Substance and Sexual Activity   Drug Use Never        Past anesthesia responses:  No problem.      Current Outpatient Medications   Medication Sig    Escitalopram (LEXAPRO) 5 mg Tablet Take 1 tablet by mouth every day.    PEG 3350-Electrolytes-Vit C (MOVIPREP) 100-7.5-2.691 gram Powder in Packet Take as directed on gi lab instruction sheet (may substitute with gavilyte C, G or N: 1 bottle, 0 refills, take as directedon gi lab instruction sheet)     Current Facility-Administered Medications   Medication    Atropine Injection 0.5 mg    DiphenhydrAMINE (BENADRYL) Injection 12.5-25 mg    Epinephrine (ADRENALIN) 0.1 mg/mL Injection 0.3-1 mg    Epinephrine (ADRENALIN) 0.1 mg/mL Injection 0.3-1 mg    Epinephrine (ADRENALIN) 0.1 mg/mL Injection 1 mg    Fentanyl (SUBLIMAZE) Injection 12.5-75 mcg    Flumazenil (ROMAZICON) Injection 0.2 mg     Lactated Ringers Infusion    Midazolam (VERSED) Injection 0.5-3 mg    Naloxone (NARCAN) Injection 0.4 mg    Simethicone (MYLICON) 40 VP/7.1 mL Oral Suspension 40-80 mg       Anticoagulants: Patient denies taking Aspirin, NSAIDs, or other anticoagulants for the past 5 days.    OTC/Herbal preparations: no    Reviewed health status with the patient in regards to allergies (including latex), medications, pregnancy, hypertension, atrial fibrilation, liver disease, bleeding disorders, diabetes,  CVA's, seizures, sleep apnea, glaucoma, cancer, prosthesis or implants, infectious diseases, surgical history; and disease of heart, lungs, liver, or kidneys. Positive history reviewed and updated in Health History section of the EMR.    Patient Active Problem List   Diagnosis    Anxiety       No Known Allergies    No past medical history on file.    Nursing Assessment Data Base:  Ventilation/Respirations: normal, unlabored.  Circulation/Perfusion/Skin: warm,normal color,dry.  Cognition/Communication -- Behavior: alert and oriented,cooperative.  Gastro-Intestinal: no distress.  Abdomen: soft,non-tender.  Level of Ambulation: self.    Belongings are with patient in garment bag under gurney.  Patient has: Denture Appliances:  upper and lower and Glasses.  Barriers to Learning assessed: none. Patient verbalizes understanding of teaching and instructions.    Nursing assessment completed by:  Holley Bouche, RN  01/21/2019 08:06

## 2019-01-22 LAB — SURGICAL PATHOLOGY

## 2019-01-24 ENCOUNTER — Encounter: Payer: Self-pay | Admitting: Gastroenterology

## 2019-01-24 ENCOUNTER — Telehealth: Payer: Self-pay | Admitting: Gastroenterology

## 2019-01-24 HISTORY — PX: PR COLONOSCOPY FLX W/ENDOSCOPIC MUCOSAL RESECTION: 45390

## 2019-01-24 NOTE — Progress Notes (Signed)
Dear Ms. Kristopher Glee    I have reviewed your pathology results and want to give them to you. The type of polyp(s) that I removed is a adenoma. This kind of polyp is very common and having removed the polyp(s) reduces your risk for colon cancer in the future. I recommend that your colonoscopy be repeated in 3 years.     There was some mild irritation around the area with diverticulosis in you colon. If you are not having lower abdominal pain, fever, this irritation was likely temporary and related to the prep. I would recommend a high fiber diet and adding benefiber daily to prevent constipation. Please do not hesitate to contact me with questions. If you would like a follow-up visit to discuss further, i'd be happy to talk more.    Sincerely,    Luanna Salk, MD    Additional information:  Polyps are very common, even in healthy people, but I am often asked what can be done to reduce your chance of having polyps in the future. I usually recommend making sure that you are at a healthy weight, eat a well balanced diet that is low in fat and high in fruits, vegetables, and fiber, avoiding excessive alcohol use and completely avoiding tobacco use.

## 2019-01-24 NOTE — Telephone Encounter (Signed)
Post procedure follow up call: Left message on patient's voicemail for post-colonoscopy call. Instructed to call [facility] if any questions or concerns at [phone], Monday thru Friday [phone] to [phone].     Hannahmarie Asberry Cazares-Mariscal, LVN   [facility]

## 2019-02-01 ENCOUNTER — Encounter: Payer: Self-pay | Admitting: FAMILY PRACTICE

## 2019-02-01 ENCOUNTER — Ambulatory Visit: Payer: Medicare PPO | Admitting: FAMILY PRACTICE

## 2019-02-01 VITALS — BP 125/76 | HR 83 | Temp 98.2°F | Wt 147.9 lb

## 2019-02-01 DIAGNOSIS — K5730 Diverticulosis of large intestine without perforation or abscess without bleeding: Secondary | ICD-10-CM | POA: Insufficient documentation

## 2019-02-01 DIAGNOSIS — D126 Benign neoplasm of colon, unspecified: Secondary | ICD-10-CM | POA: Insufficient documentation

## 2019-02-01 DIAGNOSIS — F419 Anxiety disorder, unspecified: Secondary | ICD-10-CM

## 2019-02-01 MED ORDER — CLONAZEPAM 0.25 MG DISINTEGRATING TABLET: 0.2500 mg | DISINTEGRATING_TABLET | Freq: Two times a day (BID) | ORAL | 0 refills | Status: DC | PRN

## 2019-02-01 MED ORDER — OMEPRAZOLE 20 MG CAPSULE,DELAYED RELEASE
20.0000 mg | DELAYED_RELEASE_CAPSULE | Freq: Every day | ORAL | 1 refills | Status: DC | PRN
Start: 2019-02-01 — End: 2019-03-30

## 2019-02-01 NOTE — Nursing Note (Signed)
Patient verified using 2 identifiers.  Vitals taken, screened for allergies, pain and tobacco use.  Pharmacy verified.  HM due: HM due topics reviewed. Patient is up to date.  Lajuan Kovaleski, MA    Patient WAS wearing a surgical mask  Standard precautions were followed when caring for the patient.   PPE used by provider during encounter: Surgical mask and face shield

## 2019-02-01 NOTE — Progress Notes (Signed)
Date of Service: 02/01/2019; note started 8:41 AM    Patient WAS wearing a surgical mask  Standard precautions were followed when caring for the patient.   PPE used by provider during encounter: Surgical mask and Face Shield/Goggles    Chief Complaint: Amanda Lester is a 63yr old female who comes in today for anxiety    Subjective:  Presents with recent colonoscopy showing SSA, diverticulosis. Bleeding believed to be due to internal hemorrhoids. Taking Benefiber, causing heartburn. Heartburn has not been a problem typically. Still with scant blood in stools, intermittently.   Also with anxiety. Sweaty feet and palms with Lexapro. Triggered depression, "curled up in a ball and cried." Currently does not feel anxious, believes anxiety is largely situational.     Review of Systems  See above-- integrated in Subjective section    Objective:  BP 125/76 (SITE: left arm, Orthostatic Position: sitting, Cuff Size: regular)   Pulse 83   Temp 36.8 C (98.2 F) (Temporal)   Wt 67.1 kg (147 lb 14.9 oz)   SpO2 98%   BMI 24.62 kg/m     PHQ-2 02/01/2019   Interest 0   Feeling 0   PHQ-2 Total Score 0       Physical Exam  Constitutional:       General: She is not in acute distress.     Appearance: She is well-developed.   Eyes:      General: No scleral icterus.  Cardiovascular:      Rate and Rhythm: Normal rate and regular rhythm.      Heart sounds: Normal heart sounds. No murmur.   Pulmonary:      Breath sounds: Normal breath sounds. No wheezing or rales.   Chest:      Chest wall: No tenderness.   Abdominal:      General: Bowel sounds are normal. There is no distension.      Palpations: Abdomen is soft. There is no mass.      Tenderness: There is no abdominal tenderness. There is no rebound.   Skin:     General: Skin is warm and dry.      Findings: No rash.   Psychiatric:         Speech: Speech normal.         Thought Content: Thought content normal. Thought content does not include homicidal or suicidal plan.          Cognition and Memory: Memory is not impaired (Insight normal).         Judgment: Judgment normal.       ASSESSMENT AND PLAN:  (F41.9) Anxiety  (primary encounter diagnosis)  Comment: Episodic, none at present.   Plan: Will try treating with PRN benzos. Given prior history of several weeks of anxiety leading up to stressful events, will use longer-acting medications-- clonazepam BID PRN. Anticipate use will be infrequent, follow up 3 mos. Discussed common and dangerous side effects of medications.     (D12.6) Serrated adenoma of colon  Comment:   Plan: Discussed need for q70yr colonoscopy.    (K57.30) Diverticulosis of colon  Comment:   Plan: High-fiber diet, avoid constipation.          Ongoing medications at end of visit:  Clonazepam (KLONOPIN) 0.25 mg Disintegrating Tablet, Take 1 tablet by mouth two times daily if needed. (anxiety)  Omeprazole (PRILOSEC) 20 mg Delayed Release Capsule, Take 1 capsule by mouth once daily if needed. (acid)    No current facility-administered medications for this  visit.       I did review patient's past medical and family/social history, no changes noted.  Barriers to Learning assessed: none. Patient verbalizes understanding of teaching and instructions.    Renato Battles, MD  Attending Physician, Family Medicine  [facility]  Electronically signed 02/01/2019 9:13 AM

## 2019-02-10 ENCOUNTER — Ambulatory Visit: Payer: Medicare PPO

## 2019-02-28 ENCOUNTER — Encounter: Payer: Self-pay | Admitting: FAMILY PRACTICE

## 2019-02-28 DIAGNOSIS — Z79899 Other long term (current) drug therapy: Secondary | ICD-10-CM | POA: Insufficient documentation

## 2019-02-28 DIAGNOSIS — G8929 Other chronic pain: Secondary | ICD-10-CM

## 2019-02-28 NOTE — Telephone Encounter (Signed)
From: Amanda Lester  To: Renato Battles, MD  Sent: 02/28/2019 12:16 PM PDT  Subject: Preventive Care    Hi Doctor. Should I make an appointment for my flu shot? I think I need the over 65 vaccine. Also I got notice from [facility] that they needed a new referral because it was over 30 days. That is my fault for waiting to make my appointment. Also I got a notice from [facility] they denied payment for the Clonazepam ODT. I've never dealt with something like this before so not sure if your office needs to know or do something. Thanks. None of this is overly important except for my flu shot. I'm anxious to get that done.

## 2019-02-28 NOTE — Telephone Encounter (Signed)
Received message from patient requesting PA for Rx Clonazepam (KLONOPIN) 0.25 mg Disintegrating Tablet. Rejection msg states "insurance denied payment." Patient pharmacy is attached. Please review and advise.

## 2019-02-28 NOTE — Telephone Encounter (Signed)
Per the patients insurance a PA is not required for the clonazepam. Called the pharmacy and confirmed a paid claim on 02/01/2019 with a copay of $2.50.    Thank you

## 2019-03-07 ENCOUNTER — Encounter: Payer: Self-pay | Admitting: FAMILY PRACTICE

## 2019-03-07 NOTE — Telephone Encounter (Signed)
From: Amanda Lester  To: Amanda Lester  Sent: 03/07/2019 7:43 AM PDT  Subject: Preventive Care    Are the over 65 flu vaccines available now? If not available, should we get it at [facility]? I'm anxious to get it for my husband because of his compromised health. We would rather get them from the [facility] if the wait is not going to be too long. Thanks! Please let me know.

## 2019-03-08 ENCOUNTER — Ambulatory Visit: Payer: Medicare PPO

## 2019-03-08 ENCOUNTER — Telehealth: Payer: Self-pay | Admitting: FAMILY PRACTICE

## 2019-03-08 DIAGNOSIS — Z23 Encounter for immunization: Secondary | ICD-10-CM

## 2019-03-08 NOTE — Telephone Encounter (Signed)
Called patient's home number. No voicemail to leave a message. Called Mobile number. Left message to return call.    Please assist patient with a nurse visit for HD Flu shot.

## 2019-03-08 NOTE — Telephone Encounter (Signed)
Patient seen today on nurse visit for HD flu shot

## 2019-03-08 NOTE — Nursing Note (Signed)
The patient has received a flu vaccine previously: yes  The Influenza Vaccine VIS document for the flu injection was given to patient to review. Patient or person named in permission has answered no to any history of egg allergy, previous serious reaction to a influenza vaccine or current illness which would preclude them receiving an immunization via protocol and policy 2041 without additional physician input. Any questions were referred to the physician. The Influenza Vaccine was then administered per protocol or by Policy 2041 using the standing order from Dr. James Kirk (Academic) or Dr. Kurt Slapnik (Network). The patient was observed for 2 minutes for immediate reactions to the vaccine per protocol. no reaction was observed.   Amanda Correia E Pantoja-Garcia, MA

## 2019-03-14 ENCOUNTER — Ambulatory Visit
Payer: Medicare PPO | Attending: Rehabilitative and Restorative Service Providers" | Admitting: Rehabilitative and Restorative Service Providers"

## 2019-03-14 DIAGNOSIS — M549 Dorsalgia, unspecified: Secondary | ICD-10-CM

## 2019-03-14 DIAGNOSIS — G8929 Other chronic pain: Secondary | ICD-10-CM | POA: Insufficient documentation

## 2019-03-14 DIAGNOSIS — M545 Low back pain: Secondary | ICD-10-CM | POA: Insufficient documentation

## 2019-03-14 NOTE — Progress Notes (Signed)
PHYSICAL THERAPY EVALUATION 03/14/2019     Diagnosis:  Therapy (primary): back and hip pain  Medical (secondary): Chronic left-sided low back pain without sciatica [M54.5, G89.29] - Primary     Authorizing MD (First and last name) and PI#: [place]   [id number]   Onset Date: 02/28/19     Start of Care Date: 03/14/2019   Prior Level of function: was able to exercise,  Prior treatment for this diagnosis: no  Rehab potential: Good    Precautions: none    Certification period end date (for Medicare patients): 06/12/2019  Estimated Discharge Date: 06/12/19    Plan of care:  Established 03/14/2019   1 times every 1-2 week for a total of 6-8 treatments.  To include   Therapeutic Procedure  Neuromuscular Re-education  Therapeutic Activity  Traction  Deep Soft Tissue Massage  Joint Mobilization  Modalities: hot packs, cold packs, Transcutaneous Electrical Nerve Stimulator (TENS) and ultrasound  Home Exercise Program  Patient/Caregiver Education    Goals:   Patient will be able to sit 90 minutes in order to work on computer  Patient will be able to be on feet 90 minutes in order to work around home  Patient will be able to walk 90 minutes in order to golf  Patient will be able to sit and reach floor to tie shoes  Patient will be able to sleep 6 hours and wake without pain  Patient will be able to use computer 60 minutes  Patient will be independent with home exercise program and home recommendations in order to continually progress function        Total Evaluation time: 20   Treatment time in addition to evaluation: 20    Pre pain: 0-1/10  Post pain:  0-1/10      SUBJECTIVE  History of current problem:                                   Amanda Lester is a 63yr old female referred to physical therapy due to left sided lumbar spine, left hip and left knee pain. Lumbar spine typically occurs with prolong laying down, or laying supine. Hip pain came on with clamshell leg lifting exercises.  Right knee pain       Aggravating factors include: clams, prolonged laying supine, using computer (shoulder/neck)  Easing Factors include: rest, stretching    No images are attached to the encounter.     Pain ranges from   0 /10 at best   3/10 at worst     Limitations in activities of daily living include:    Sitting: 30+ minutes for shoulder neck   Laying supine    Medical history:   Electronic Medical Record Reviewed: Including radiologist reports of associated images and scans     None on file     No past medical history on file.    Past Surgical History:   Procedure Laterality Date    PR CESAREAN DELIVERY ONLY  1974    C-section, low cervical    PR COLONOSCOPY FLX W/ENDOSCOPIC MUCOSAL RESECTION  01/21/2019         PR COLONOSCOPY FLX W/ENDOSCOPIC MUCOSAL RESECTION  01/24/2019                  SPECIAL QUESTIONS:   Any unexplained weight loss? no   History of Rheumatoid arthritis? no   Anticoagulant or long term  steroid? no   Dizziness? no   Cord Symptoms? (bilateral numbness/tingling, bowel/bladder changes, poor balance) no       Social History:  Retired from state, married, likes to golf and exercise  Social History     Social History Narrative    Not on file       OBJECTIVE EXAM:  Appearance:   healthy, alert and cooperative    Inspection/Palpation:   inspection of the back is normal  Muscle tone: left deep paraspinals taut    Gait:    Assistive device used: no assistive device  Gait assessment (description of gait pattern): Smooth progression through stance and swing. Normal heel-to-toe. Normal trunk sway     Range of motion:     Lumbar Spine     Normal  Other measurement techniques    Flexion 110 110      inches above ankle (with longest finger)   Extension  Pain at 50% of motion 30         Left Right Normal  Other measurement techniques    Side Bending  Pain at end range WNL, no pain 30  inches above knee joint line (with longest finger)   Rotation  Slight pain Slight pain at end range 20                              Hips and lower extremities general flexibility: fair    hip external rotation on left 20, right 35        Strength: MMT out of 5   Left  Right    Hip Flexion  (L2) 5 5   Knee Extension (L3) 5 5   Ankle Dorsiflexion (L4) 5 5   Long Toe Extensors (L5) 5 5   Ankle Plantar Flexors (S1) 5 5                        Segmental Testing: (note abnormality)   Lumbar spine:  localized tenderness to passive anterior-posterior intervertebral mobilizations to a grade 4   Neurological screens:    Sensation and strength equal bilateral       Special Tests:   Left  Right  Reason for test Position of test   Straight Leg Raise negative negative Provocation test for sciatic nerve irritation (positive at 30-60 degrees)   Supine raise one leg up keeping it straight from 0-70 degrees.  (can be sensitized)      Yeoman's Test: negative negative Provocation test for SI joint dysfunction.    Prone: stabilize contralateral hip and pull upward on anterior thigh   Patrick's Test  (FABER) positive negative Provocation test for ipsolateral hip dysfunction, or contralateral SI joint dysfunction   Supine: Stabilize contralateral pelvis.  Flex, abduct and ER hip. (figure 4 position)     Femoral Nerve Stretch negative negative Femoral Nerve Irritation/ Lumbar radiculopathy Prone with knee flexed: Elevate (extend) hip with knee still in flexion.  (modified in sitting if needed)       Treatment today in addition to evaluation(if applicable):    Therapeutic Procedure 15 minutes -   Range of motion exercises    Single knee to chest stretch for glutes, lumbar spine and hamstring    Figure 4 piriformis stretch    Hip longitudinal distraction for lumbar spine traction   Edge of bed hip flexor stretch   kneeling hip flexor stretch  Range of motion exercises  Upper trapezius stretch for relieve cervical spine strain   Levator scap stretch to relieve posterior neck pain and strain   Corner / doorway chest stretch for pec major and minor  Strengthening  exercises    Postural sits with scapular and cervical spine retraction    Scapular squeezes for mid trap strength for posture      Patient educated in the availability of application of a High Velocity Low Amplitude thrust mobilization which will cause audible cavitation(s) at the cervical spine and cervico-thoracic junction,  thoracic spine to improve the movement between the zygapophyseal joints to decrease the pull/tension of the overlying muscles to decrease pain and improve range of motion                            Verbal consent given by patient prior to positioning for mobilization  ,   HVLA thrust mobilization individually on each side, but repeated bilaterally to the cervical spine with a side shift with opposite way side bend and HVLA rotational thrust to shift side with 1 cavitation with no reported discomfort during and  immediately following the thrust, and immediate reduction of pain and tension in the upper trapezius and mid trap, cervical spine extensors and rotators  Range of motion stable muscle tension and pain reduced     Patient positioned with arms crossed and spine slightly extended, HVLA thrust mobilization centrally in an anterior to posterior direction through the arms to the thoracic spine with multiple cavitations with no reported discomfort during and  immediately following the thrust, and immediate reduction of pain and tension in the trapezius and mid trap, rhomboid area  Muscle tension and pain  reduced      Patient was educated in the anatomy and physiology involved.  Common symptoms, treatments, responses to treatment, and self management were discussed with the patient.       ASSESSMENT: The patient presents with pain and decreased function due to low back dysfunction.   The signs and symptoms are consistent with facet dysfunction, left glute strain and likely ITB tightness causing pain and decreased ability to lay supine, neck and postural dysfunction causing pain and decreased  sitting tolerance  and patient requires skilled physical therapy in order to achieve functional goals and provide education in order to improve ability to lay supine, sleep and exercise      Clinical Presentation: Stable/Uncomplexed     Plan for next visit: gentle strengthening of core and postural work  (see plan of care at top of note for general plan details)    In the event of unattended discharge, this note will serve as discharge document      Patient Education:      Method of Teaching: demonstration, verbal and written hand-out     Learner: patient     Response: verbalizes understanding and able to give return demonstration       Has the plan of care been explained to the patient/caregiver? yes       Is the patient able to understand the plan of care: yes       Does the patient/caregiver(s) agree with the plan of care: yes       Are there barriers to learning? yes.         Motivated to learn? yes       Best learning method: verbal, or demo      Patient/Family participation and agreement with recommendations: verbalized agreement  Is patient currently receiving outside care for this problem:     Home Health care services: no  Skilled nursing facility: no    Other no    The components of this evaluation necessitated a Low complexity level of clinical decision making.    M. Rose Fillers, PT  PI# [id number]  Pager- [phone]

## 2019-03-27 ENCOUNTER — Other Ambulatory Visit: Payer: Self-pay | Admitting: FAMILY PRACTICE

## 2019-03-28 ENCOUNTER — Ambulatory Visit: Payer: Medicare PPO | Admitting: Rehabilitative and Restorative Service Providers"

## 2019-03-30 NOTE — Telephone Encounter (Signed)
Last OV: 02/01/19  Last Rx'd: 02/01/19  Hillary Bow, MA    [facility]: Can you please call and schedule pt Mammo?

## 2019-03-31 ENCOUNTER — Ambulatory Visit: Payer: Medicare PPO

## 2019-04-01 ENCOUNTER — Ambulatory Visit: Payer: Medicare PPO | Admitting: Rehabilitative and Restorative Service Providers"

## 2019-04-01 DIAGNOSIS — M549 Dorsalgia, unspecified: Secondary | ICD-10-CM

## 2019-04-01 NOTE — Progress Notes (Signed)
Physical Therapy Treatment Note     Diagnosis:  Therapy (primary): back and hip pain  Medical (secondary): Chronic left-sided low back pain without sciatica [M54.5, G89.29] - Primary     Authorizing MD (First and last name) and PI#: [place]   [id number]   Onset Date: 02/28/19     Start of Care Date: 03/14/2019   Prior Level of function: was able to exercise,  Prior treatment for this diagnosis: no  Rehab potential: Good    Precautions: none    Certification period end date (for Medicare patients): 06/12/2019  Estimated Discharge Date: 06/12/19    Plan of care:  Established 03/14/2019             1 times every 1-2 week for a total of 6-8 treatments.  To include   Therapeutic Procedure  Neuromuscular Re-education  Therapeutic Activity  Traction  Deep Soft Tissue Massage  Joint Mobilization  Modalities: hot packs, cold packs, Transcutaneous Electrical Nerve Stimulator (TENS) and ultrasound  Home Exercise Program  Patient/Caregiver Education    Goals:   Patient will be able to sit 90 minutes in order to work on computer  Patient will be able to be on feet 90 minutes in order to work around home  Patient will be able to walk 90 minutes in order to golf  Patient will be able to sit and reach floor to tie shoes  Patient will be able to sleep 6 hours and wake without pain  Patient will be able to use computer 60 minutes  Patient will be independent with home exercise program and home recommendations in order to continually progress function      Total # of visits: 2/6-12    Total Treatment Time: 35    Pre pain: 2/10 in lumbar spine, 4/10 in neck  Post pain:       "better"/10    Subjective: Reviewed progress towards goals (see above section) patient doing well, about the same. Asking about overhead triceps presses and elevated chest fly     Objective: Patient seen for the following treatment:   Therapeutic Exercise x 20 minutes for core strength and posture  Sci Fit seated stepper X 5 minutes for muscular  warm to prepare for stretches and activity.   The following exercises were performed following manual therapy    Side plank lift from knees 2 x 10   Plank from knees wit hip extension lift 2 x 10   Quadruped kick backs for glute and core strength  Wall push ups to learn to hold scapulae retracted during arm motions for postural strengthening   Wall- serratus anterior push ups for scapular retraction strengthening  Edge of armed chair reverse dip push ups- for posterior rotator cuff muscle strengthening for posture     Manual Therapy x 15 minutes  Manual stretches by PT  for patient to stay relaxed for maximal muscle relaxation and elongation  Patient positioned in supine   Upper trapezius via shoulder depression and cervical spine side bend   Levator scap via shoulder depression and opposite cervical spine flexion   Shoulder depression for bilateral upper trapezius    Shoulder Retraction for pec major and minor   trigger point via Hook grasp under right scapula, working fingers into the subscapular area for trigger point on subscapularis muscle, as well as lateral/abduction stretching of the mid trap and rhomboids    Trigger point to right upper trapezius with passive shoulder abduction and flexion to release  muscle and reduce tension       Patient educated application of a High Velocity Low Amplitude thrust mobilization which will cause audible cavitation(s) at the  cervical spine and cervico-thoracic junction  to improve the movement between the zygapophyseal joints to decrease the pull/tension of the overlying muscles to decrease pain and improve range of motion                            Verbal consent given by patient prior to positioning for mobilization      HVLA thrust mobilization individually on each side, but repeated bilaterally to the cervical spine with a side shift with opposite way side bend and HVLA rotational thrust to shift side with nocavitations with mild reported discomfort during and   immediately following the thrust, and immediate reduction of pain and tension in the upper trapezius and mid trap, cervical spine extensors and rotators  Patient requested that second thrust to each side no be delivered         Assessment: Patient was progressed with postural and glute strengthening exercises. Due to good compliance with home exercise program, showed carryover from previous session with exercises given as home program. No pain was felt during the treatment session execises.  Demonstrated understanding of mid back strengthening exercises in order to decrease strain on neck and upper back. Skilled physical therapy is indicated in order to progress to more dynamic strengthening, decrease pain, and improve functional mobility.       Plan: continue physical therapy -  Progressive core strengthening    Any changes to plan of care, or goals: no   If so then plan of care will be sent to MD for approval.     In the event of unattended discharge, this note will serve as discharge document    M. Rose Fillers, PT  PI# [phone]  Pager- [phone]

## 2019-04-11 ENCOUNTER — Ambulatory Visit: Payer: Medicare PPO | Admitting: Rehabilitative and Restorative Service Providers"

## 2019-04-14 ENCOUNTER — Ambulatory Visit
Payer: Medicare PPO | Attending: Rehabilitative and Restorative Service Providers" | Admitting: Rehabilitative and Restorative Service Providers"

## 2019-04-14 DIAGNOSIS — G8929 Other chronic pain: Secondary | ICD-10-CM | POA: Insufficient documentation

## 2019-04-14 DIAGNOSIS — M549 Dorsalgia, unspecified: Secondary | ICD-10-CM

## 2019-04-14 DIAGNOSIS — M545 Low back pain: Secondary | ICD-10-CM | POA: Insufficient documentation

## 2019-04-14 NOTE — Progress Notes (Signed)
Physical Therapy Treatment Note:      Diagnosis:  Therapy (primary): back and hip pain  Medical (secondary): Chronic left-sided low back pain without sciatica [M54.5, G89.29] - Primary     Authorizing MD (First and last name) and PI#: [place]   [id number]   Onset Date: 02/28/19     Start of Care Date: 03/14/2019   Prior Level of function: was able to exercise,  Prior treatment for this diagnosis: no  Rehab potential: Good    Precautions: none    Certification period end date (for Medicare patients): 06/12/2019  Estimated Discharge Date: 06/12/19    Plan of care:  Established 03/14/2019             1 times every 1-2 week for a total of 6-8 treatments.  To include   Therapeutic Procedure  Neuromuscular Re-education  Therapeutic Activity  Traction  Deep Soft Tissue Massage  Joint Mobilization  Modalities: hot packs, cold packs, Transcutaneous Electrical Nerve Stimulator (TENS) and ultrasound  Home Exercise Program  Patient/Caregiver Education    Goals:   Patient will be able to sit 90 minutes in order to work on computer  NOT MET   Patient will be able to be on feet 90 minutes in order to work around home  NOT MET   Patient will be able to walk 90 minutes in order to golf  NOT MET   Patient will be able to sit and reach floor to tie shoes  NOT MET   Patient will be able to sleep 6 hours and wake without pain  NOT MET   Patient will be able to use computer 60 minutes  NOT MET   Patient will be independent with home exercise program and home recommendations in order to continually progress function  NOT MET     Total # of visits: 3/8-12    Total Treatment Time: 40 minutes     Pre pain: Did not rate/10 low back  Post pain:       Did not rate /10    Subjective: patient stating that she does exercises with her girl friends on [employer]. Does sit at her desk space X2-3 hours a day usually in the morning.     Objective: Patient seen for the following treatment:   Therapeutic Procedure: 30 minutes   -  Scifit 8  minutes (2 minute setup) Level 2.0 to increase circulation, lower extremity strengthening, and range of motion to prepare muscles for increased activity.    -  Towel roller chest stretch and neutrals spine   -  Supine hooklying neutral spine   -  Core stabilization with neutral spine   -  Core stabilization marching     Therapeutic Activity 10 minutes   - Chair ergonomics for home desk adding longitudinal support and lumbar spine support   -  Introduced to and discussed SLEEK     Assessment: patient doing fairly well, needs core strengthening, has good stretching routine and weekly routine with friends. Would benefit from [facility] session.  Patient would benefit from further skilled PT to address core and hip strengthening and range of motion to increase tolerance for functional activities.     Plan: continue physical therapy - as per primary Physical Therapist  plan of care. Begin SLEEK II on 11/17    Any changes to plan of care, or goals: no   If so then plan of care will be sent to MD for approval.  Amanda Lester, PT ASSIST  Amanda Lester, PT Assistant PI#: [id number]

## 2019-04-19 NOTE — Progress Notes (Signed)
I reviewed the Physical Therapy Assistant's progress note and agree with the PTA's treatment and plan that was developed.  Patient would benefit from SLEEK class       In the event of unattended discharge, this note will serve as discharge document.    M. Rose Fillers, PT  PI# [phone]  Pager- [phone]

## 2019-04-25 ENCOUNTER — Ambulatory Visit: Payer: Self-pay | Admitting: Rehabilitative and Restorative Service Providers"

## 2019-04-26 ENCOUNTER — Ambulatory Visit: Payer: Medicare PPO | Admitting: Rehabilitative and Restorative Service Providers"

## 2019-05-03 ENCOUNTER — Ambulatory Visit: Payer: Medicare PPO | Admitting: Rehabilitative and Restorative Service Providers"

## 2019-05-10 ENCOUNTER — Ambulatory Visit
Payer: Medicare PPO | Attending: Rehabilitative and Restorative Service Providers" | Admitting: Rehabilitative and Restorative Service Providers"

## 2019-05-10 ENCOUNTER — Ambulatory Visit: Payer: Medicare PPO | Admitting: FAMILY PRACTICE

## 2019-05-10 ENCOUNTER — Other Ambulatory Visit: Payer: Self-pay

## 2019-05-10 ENCOUNTER — Encounter: Payer: Self-pay | Admitting: FAMILY PRACTICE

## 2019-05-10 VITALS — BP 137/80 | HR 81 | Temp 97.8°F | Ht 63.9 in | Wt 151.2 lb

## 2019-05-10 DIAGNOSIS — Z79899 Other long term (current) drug therapy: Secondary | ICD-10-CM

## 2019-05-10 DIAGNOSIS — G8929 Other chronic pain: Secondary | ICD-10-CM | POA: Insufficient documentation

## 2019-05-10 DIAGNOSIS — F419 Anxiety disorder, unspecified: Secondary | ICD-10-CM

## 2019-05-10 DIAGNOSIS — M549 Dorsalgia, unspecified: Secondary | ICD-10-CM

## 2019-05-10 DIAGNOSIS — M545 Low back pain: Secondary | ICD-10-CM | POA: Insufficient documentation

## 2019-05-10 MED ORDER — CLONAZEPAM 0.25 MG DISINTEGRATING TABLET: 0.2500 mg | DISINTEGRATING_TABLET | Freq: Two times a day (BID) | ORAL | 0 refills | Status: DC | PRN

## 2019-05-10 NOTE — Nursing Note (Signed)
Patient verified using 2 identifiers.  Vitals taken, screened for allergies, pain and tobacco use.  Pharmacy verified.  HM due: HM due topics reviewed. Patient is up to date. Patient Mammo is scheduled.  Renelda Mom, MA    Patient WAS wearing a surgical mask  Standard precautions were followed when caring for the patient.   PPE used by provider during encounter: Surgical mask and face shield

## 2019-05-10 NOTE — Progress Notes (Signed)
Date of Service: 05/10/2019; note started 9:09 AM    Patient WAS wearing a surgical mask  Standard precautions were followed when caring for the patient.   PPE used by provider during encounter: Surgical mask and Face Shield/Goggles    Chief Complaint: Amanda Lester is a 63yr old female who comes in today for anxiety    Subjective:  Presents with episodic anxiety, here for follow up. Has used all 30 tab of clonazepam, last pill couple of days ago. Helps cut off progression of stress to neck ache to headache. Has a psychotherapy, not currently seeing. Husband with CVA 06/28/18. No significant side effects of medications.   Also with chronic pain left thumb, seeing physical therapy. Taking Tylenol.     Review of Systems  See above-- integrated in Subjective section    Objective:  BP 137/80 (SITE: left arm, Orthostatic Position: sitting, Cuff Size: regular)   Pulse 81   Temp 36.6 C (97.8 F) (Temporal)   Ht 1.623 m (5' 3.9")   Wt 68.6 kg (151 lb 3.8 oz)   SpO2 98%   BMI 26.04 kg/m     PHQ-2 02/01/2019   Interest 0   Feeling 0   PHQ-2 Total Score 0       Physical Exam  Constitutional:       General: She is not in acute distress.     Appearance: She is well-developed.   Eyes:      General: No scleral icterus.  Skin:     General: Skin is warm and dry.      Findings: No rash.   Psychiatric:         Speech: Speech normal.         Thought Content: Thought content normal. Thought content does not include homicidal or suicidal plan.         Cognition and Memory: Memory is not impaired (Insight normal).         Judgment: Judgment normal.       ASSESSMENT AND PLAN:  (F41.9) Anxiety  (primary encounter diagnosis)  Comment: Controlled with low-dose, intermittent clonazepam. At current dose and frequency, likelihood of significant side effects or complications of benzos is small.   Plan: Stable on current treatment regimen.  Continue.  Contact office if any problems. Clonazepam refilled. Follow up 6 mos. Discussed  psychotherapy-- likely to lead to better long-term control. Patient has therapist, will contact.       Ongoing medications at end of visit:      Clonazepam (KLONOPIN) 0.25 mg Disintegrating Tablet, Take 1 tablet by mouth two times daily if needed. (anxiety)      Omeprazole (PRILOSEC) 20 mg Delayed Release Capsule, Take 1 capsule by mouth once daily before a meal.    No current facility-administered medications for this visit.       I did review patient's past medical and family/social history, no changes noted.  Barriers to Learning assessed: none. Patient verbalizes understanding of teaching and instructions.    Renato Battles, MD  Attending Physician, Family Medicine  [employer] [employer]  Electronically signed 05/10/2019 2:00 PM

## 2019-05-10 NOTE — Progress Notes (Signed)
Physical Therapy Paramedical Progress Record SLEEK Core Level II      Diagnosis:  Therapy (primary): back and hip pain  Medical (secondary): Chronic left-sided low back pain without sciatica [M54.5, G89.29] - Primary     Authorizing MD (First and last name) and PI#: [place]   [id number]   Onset Date: 02/28/19     Start of Care Date: 03/14/2019   Prior Level of function: was able to exercise,  Prior treatment for this diagnosis: no  Rehab potential: Good    Precautions: none    Certification period end date (for Medicare patients): 06/12/2019  Estimated Discharge Date: 06/12/19    Plan of care:  Established 03/14/2019             1 times every 1-2 week for a total of 6-8 treatments.  To include   Therapeutic Procedure  Neuromuscular Re-education  Therapeutic Activity  Traction  Deep Soft Tissue Massage  Joint Mobilization  Modalities: hot packs, cold packs, Transcutaneous Electrical Nerve Stimulator (TENS) and ultrasound  Home Exercise Program  Patient/Caregiver Education    Goals:   Patient will be able to sit 90 minutes in order to work on computer  NOT MET   Patient will be able to be on feet 90 minutes in order to work around home  NOT MET   Patient will be able to walk 90 minutes in order to golf  NOT MET   Patient will be able to sit and reach floor to tie shoes  NOT MET   Patient will be able to sleep 6 hours and wake without pain  NOT MET   Patient will be able to use computer 60 minutes  NOT MET   Patient will be independent with home exercise program and home recommendations in order to continually progress function  NOT MET       Total Treatment Time: 60 minute   Total Visits to Date: 1/4 for SLEEK Core Class, 4/8-12total treatment(s)          Pre Pain:  did not rate/10  Post Pain:  did not rate/10    Subjective: patient stating that she been having bad headaches and has not been able to get in until today.    Objective: Patient seen for the following treatment  Patient education 10  minutes  Orientation to class expectations, including:  -  progress in form of increase(d) function, strength, endurance without increase(d) pain before decrease(d) intensity or frequency of pain   -  expected patient exercise follow-up additional 3-4 days/week  Soft tissue mobilization / Joint mobilization 25 minutes:  With maximal moderate adjustments by therapist to accommodate patient's midrange:  -  Supported spinal twist with towel roll to cervical spine and lumbar spine 5 minute(s) each side or 8 minute(s) painful side up  -  Supported Shvasna with belt around thighs x 10 minute(s) // resting pain 0/10  Neuromuscular Re-education / Therapeutic activity 25 minutes:   With maximal verbal and tactile cueing throughout by therapist for engaging core muscles for neutral spinal and proximal alignment and for proper biomechanics:  -  After instruction in stand to kneel to side sit on roller to sit on roller to hook lying over pillows over foam roller, towel roll to support cervical spine and lumbar spine, prn  With emphasis on correct hip/knee/ankle alignment, normal lumbar lordosis (fingers unable to reach past spinous processes), hollowing of abdominals to engage transversus abdominis:  -  Balance hold, began with  goal(s) post to touchdown arms with bolsters to guide movement, then no upper extremity support  -  Progressing B to narrower base of support, tandem stance  -  Progressing to single lower extremity extension   -  Progressing to single upper extremity extension and goal(s) post to touchdown arms   -  Progressing to march with opposite upper extremity / lower extremity extension   -  4-point hold with foam roller along spine for positioning cue  -  Progressing to single lower extremity extension, then opposite upper extremity / lower extremity extension   Information given for foam roller purchase.    Assessment: Patient required maximal cueing throughout but responded well to postures and intro to the  foam roller. Needs continued physical therapy to improve core alignment, muscle balance, and strength to perform ADLs.    Plan: Continue plan of care per primary Physical Therapist goal(s). Return to clinic in 1 week for program progression.       Any changes to plan of care, or goals: no   If so then plan of care will be sent to MD for approval.     Amanda Lester, PT ASSIST  [name], PT Assistant PI#: [phone]

## 2019-05-12 NOTE — Progress Notes (Signed)
I reviewed the Physical Therapy Assistant's progress note and agree with the PTA's treatment and plan that was developed.      In the event of unattended discharge, this note will serve as discharge document.    M. Casey Jacora Hopkins, PT  PI# [id number]  Pager- [phone]

## 2019-05-17 ENCOUNTER — Ambulatory Visit: Payer: Medicare PPO | Admitting: Rehabilitative and Restorative Service Providers"

## 2019-05-25 ENCOUNTER — Telehealth: Payer: Self-pay | Admitting: Rehabilitative and Restorative Service Providers"

## 2019-05-25 ENCOUNTER — Ambulatory Visit: Payer: Medicare PPO | Admitting: Rehabilitative and Restorative Service Providers"

## 2019-05-25 NOTE — Telephone Encounter (Signed)
Spoke with patient re: concerns for COVID-19 and coming to the clinic during this surge. Recognized her concerns and recommended she return to clinic when she feels comfortable. Unfortunately her PT intervention is not viable via video visits. Also informed patient that we may need a new referral if over 6 months from initial referral. Patient receptive and understanding.     Anuel Sitter Linford Arnold, PT ASSIST  [name], PT Assistant PI#: [phone]

## 2019-05-31 ENCOUNTER — Ambulatory Visit: Payer: Medicare PPO | Admitting: Rehabilitative and Restorative Service Providers"

## 2019-06-03 ENCOUNTER — Other Ambulatory Visit: Payer: Self-pay | Admitting: FAMILY PRACTICE

## 2019-06-04 NOTE — Telephone Encounter (Signed)
Last visit: 05/10/19  Last refill: 03/30/19

## 2019-06-21 ENCOUNTER — Ambulatory Visit: Payer: Medicare PPO

## 2019-06-23 ENCOUNTER — Encounter: Payer: Self-pay | Admitting: FAMILY PRACTICE

## 2019-06-23 MED ORDER — CLONAZEPAM 0.25 MG DISINTEGRATING TABLET: 0.2500 mg | DISINTEGRATING_TABLET | Freq: Two times a day (BID) | ORAL | 0 refills | Status: DC | PRN

## 2019-06-23 NOTE — Telephone Encounter (Signed)
Last fill date 05/10/19  Last office visit 05/10/19

## 2019-06-23 NOTE — Telephone Encounter (Signed)
Please see refill encounter 06/23/2019.

## 2019-06-23 NOTE — Telephone Encounter (Signed)
From: Kristopher Glee  To: Renato Battles, MD  Sent: 06/23/2019 12:45 PM PST  Subject: MyChart Refill Request    Hi Doctor Oren Binet. I requested a refill for my anxiety medication this morning. I still go to [place] in [facility]. Thanks. Hope you're well.

## 2019-06-28 ENCOUNTER — Encounter: Payer: Self-pay | Admitting: FAMILY PRACTICE

## 2019-06-28 NOTE — Telephone Encounter (Signed)
From: Kristopher Glee  To: Renato Battles, MD  Sent: 06/27/2019 11:33 AM PST  Subject: Preventive Care    Dr. Oren Binet, any idea when the over 65/with comprised health folks will be able to be vaccinated? Any information you can provide will be helpful. Thanks!

## 2019-07-05 ENCOUNTER — Other Ambulatory Visit: Payer: Self-pay | Admitting: Family Medicine

## 2019-07-10 ENCOUNTER — Ambulatory Visit: Payer: Medicare PPO | Attending: Family Medicine

## 2019-07-10 DIAGNOSIS — Z23 Encounter for immunization: Secondary | ICD-10-CM

## 2019-07-10 NOTE — Progress Notes (Signed)
The patient is eligible to receive the COVID-19 Vaccine at this time: Yes    The patient has received a COVID vaccine previously: No      The patient acknowledges receipt of the Fact Sheet for Recipients and Caregivers for the Pfizer-BioNtech vaccine, which includes information about the risks and benefits of the vaccine and available alternatives. The patient was informed the FDA has authorized the emergency use of the COVID-19 Vaccine, which is not an FDA-approved vaccine and that the patient may accept or refuse the vaccine.    Pre-Screening Documentation:      COVID-19 Immunization Allergies  1. Have you ever had a severe allergic reaction to any component of the COVID-19 Vaccine? (Review ingredients in the Fact Sheet for Recipients and Caregivers): No  2. Did you have an immediate allergic reaction (within 4 hours) of receiving a previous dose of the COVID-19 Vaccine?: Not Applicable  3. Have you ever had an immediate allergic reaction of any severity to polysorbate?: No  Did the patient answer yes to any of questions 1-3?: No (proceed to question 4)      COVID-19 Immunization Clinical Considerations  4.Are you pregnant or breastfeeding?: Not Applicable  5.Do you have a weakened immune system caused by something such as HIV infection or cancer, or do you take medications that affect the immune system?: No  6. Do you have a bleeding disorder or are you taking blood thinners? : No  Did the patient answer yes to any of questions 4-6? : No (proceed to question 7)      COVID-19 Immunization Deferral Considerations  7. Have you received any other vaccines within the past 14 days? : No  8. Have you tested positive for COVID-19 in the last 14 days? : No  9. Have you received a monoclonal antibody or convalescent plasma treatment for COVID-19 in the past 90 days? : No  10. Have you had a confirmed COVID-19 exposure in the past 14 days?: No  11. Are you feeling sick today? : No  Did the patient answer yes to any of  questions 7-11?: No (proceed to question 12)      COVID-19 Immunization Observation Considerations  12. Do you have a history of immediate allergic reaction of any severity to a vaccine?: No  13. Do you have a history of immediate allergic reaction of any severity to an injectable medication?: No  14. Do you have a history of severe allergic reaction due to any cause? : No  Did the patient answer yes to any of questions 12-14? : No  If no, the patient was informed a 15-minute observation period is recommended after the vaccine to watch for a reaction.: Confirmed        All patient questions were answered and the patient agrees to receive the COVID-19 vaccine today.     Vaccine prepared and administered according to the current Emergency Use Authorization and Fact Sheet for Healthcare Providers Administering Vaccine.    Patient given vaccine card and discharge instructions with information about the Fact Sheet for Recipients and Caregivers and the v-safe program. Patient directed to observation area.     Mario Coronado, RN

## 2019-07-11 ENCOUNTER — Encounter: Payer: Self-pay | Admitting: FAMILY PRACTICE

## 2019-07-11 NOTE — Telephone Encounter (Signed)
From: Kristopher Glee  To: Renato Battles, MD  Sent: 07/11/2019 9:23 AM PST  Subject: Non-urgent Medical Advice Question    Doctor Oren Binet. I had my first COVID vaccine yesterday morning. When can I begin to take Tylenol again? Thank you!

## 2019-07-21 ENCOUNTER — Encounter: Payer: Self-pay | Admitting: FAMILY PRACTICE

## 2019-07-21 NOTE — Telephone Encounter (Signed)
From: Kristopher Glee  To: Renato Battles, MD  Sent: 07/20/2019 4:46 PM PST  Subject: Non-urgent Medical Advice Question    Hi Doctor. I have a stye in my left eye. Is there something that you can call in to my pharmacy for this? Or do I need to make an appointment to see you? Thanks a bunch!

## 2019-07-21 NOTE — Telephone Encounter (Signed)
Protocol Used: Sty (Adult)  Protocol-Based Disposition: Home Care    Positive Triage Question:  * Sty on eyelid    Care Advice Discussed:  * Reassurance and Education      - A sty usually comes to a head and forms a pimple in 3 to 5 days.      - In a few more days, it usually drains and heals.      - Usually it gets better and goes away in 10-14 days.      - Here is some care advice that should help.  * Local Heat      - Apply a warm, wet washcloth to the eye for 10 minutes 4 times a day to help the sty come to a head.      - Continue to cleanse the eye with warm water several times a day even after the sty begins to drain.      - Do not rub the eye (Reason: can spread the infection).  * Pulling the Eyelash (If There is a Pimple)      - If it looks like there is a pimple centered around an eyelash, pulling the eyelash may help the sty drain.      - Use a tweezers.      - Another option is to wait for spontaneous drainage (usually 1-2 more days).      - Caution: do not squeeze the red lump.  * Things To Avoid      - Do NOT wear eye makeup. Do not use any makeup on your eyelids until the sty is completely gone.      - Do NOT squeeze the sty. Don't try to pop the pimple by pinching or squeezing it.      - Do NOT wear contact lenses. Wear glasses instead. You can wear your contacts again when the sty is gone.  * Reasons To Call Back      - Eyelid becomes red or swollen      - Sty is not better by 5 days      - Sty has not gone away  by 10 days      - Styes occur frequently      - You become worse.

## 2019-07-31 ENCOUNTER — Ambulatory Visit: Payer: Medicare PPO | Attending: Family Medicine

## 2019-07-31 DIAGNOSIS — Z23 Encounter for immunization: Secondary | ICD-10-CM

## 2019-07-31 NOTE — Progress Notes (Signed)
Patient WAS wearing a surgical mask  Contact and Droplet precautions were followed when caring for the patient.   PPE used by provider during encounter: Surgical mask, Face Shield/Goggles and Gloves    The patient is eligible to receive the COVID-19 Vaccine at this time: Yes    The patient has received a COVID vaccine previously: Yes   Immunization History   Administered Date(s) Administered    (Pneumovax) Pneumococcal Vaccine, Polysaccharide 11/03/2018    COVID-19 mRNA PF 30 mcg/0.3 mL (Pfizer) 07/10/2019, 07/31/2019    Influenza Vaccine, Quadrivalent (Fluzone High-Dose) 03/08/2019    Influenza, Seasonal, Injectable, Preservative Free 02/17/2018    Tdap 01/07/2010         The patient acknowledges receipt of the Fact Sheet for Recipients and Caregivers for the Pfizer-BioNtech vaccine, which includes information about the risks and benefits of the vaccine and available alternatives. The patient was informed the FDA has authorized the emergency use of the COVID-19 Vaccine, which is not an FDA-approved vaccine and that the patient may accept or refuse the vaccine.    Pre-Screening Documentation:      COVID-19 Immunization Allergies  1. Have you ever had a severe allergic reaction to a previous dose of the COVID-19 Vaccine or to any ingredient of the COVID-19 Vaccine? (Review ingredients in the Fact Sheet for Recipients and Caregivers): No  2. Have you ever had an immediate allergic reaction of any severity within 4 hours of receiving a previous dose of the COVID-19 Vaccine or any of its ingredients?: No  3. Have you ever had an immediate allergic reaction of any severity to polysorbate or polyethylene glycol [PEG]?: No  Did the patient answer yes to any of questions 1-3?: No (proceed to question 4)      COVID-19 Immunization Clinical Considerations  4.Are you pregnant or breastfeeding?: Not Applicable  5.Do you have a weakened immune system caused by something such as HIV infection or cancer, or do you take  medications that affect the immune system?: No  6. Do you have a bleeding disorder or are you taking blood thinners? : No  Did the patient answer yes to any of questions 4-6? : No (proceed to question 7)      COVID-19 Immunization Deferral Considerations  7. Have you received any other vaccines within the past 14 days? : No  8. Have you tested positive for COVID-19 in the last 14 days? : No  9. Have you received a monoclonal antibody or convalescent plasma treatment for COVID-19 in the past 90 days? : No  10. Have you had a confirmed COVID-19 exposure in the past 14 days?: No  11. Are you feeling sick today? : No  Did the patient answer yes to any of questions 7-11?: No (proceed to question 12)      COVID-19 Immunization Dermal Filler Consideration  12. Have you received a dermal filler injection?: No (proceed to question 13)      COVID-19 Immunization Observation Considerations  13. Do you have a history of immediate allergic reaction of any severity to a vaccine?: No  14. Do you have a history of immediate allergic reaction of any severity to an injectable medication?: No  15. Do you have a history of severe allergic reaction due to any cause? : No  Did the patient answer yes to any of questions 13-15? : No  If no, the patient was informed a 15-minute observation period is recommended after the vaccine to watch for a reaction.: Confirmed  All patient questions were answered and the patient agrees to receive the COVID-19 vaccine today.     Vaccine prepared and administered according to the current Emergency Use Authorization and Fact Sheet for Healthcare Providers Administering Vaccine.    Patient given vaccine card and discharge instructions with information about the Fact Sheet for Recipients and Caregivers and the v-safe program. Patient directed to observation area.     Mamie Levers, LVN

## 2019-08-09 ENCOUNTER — Encounter: Payer: Self-pay | Admitting: FAMILY PRACTICE

## 2019-08-09 MED ORDER — CLONAZEPAM 0.25 MG DISINTEGRATING TABLET: 0.2500 mg | DISINTEGRATING_TABLET | Freq: Two times a day (BID) | ORAL | 0 refills | Status: DC | PRN

## 2019-08-09 NOTE — Telephone Encounter (Signed)
Last refill on 06/23/2019.   Last OV on 05/10/2019.

## 2019-08-18 ENCOUNTER — Ambulatory Visit: Payer: Medicare PPO

## 2019-09-19 ENCOUNTER — Encounter: Payer: Self-pay | Admitting: FAMILY PRACTICE

## 2019-09-21 MED ORDER — CLONAZEPAM 0.25 MG DISINTEGRATING TABLET: 0.2500 mg | DISINTEGRATING_TABLET | Freq: Two times a day (BID) | ORAL | 0 refills | Status: DC | PRN

## 2019-09-21 NOTE — Telephone Encounter (Signed)
Last refill on 08/09/2019.   Last OV on 05/10/2019. Upcoming appt. on 11/08/2019.

## 2019-10-09 IMAGING — CR DG CHEST 2V
2 series · 2 of 2 positions shown · non-contrast
Comparison: Chest x-ray 08/26/2013.  Chest CT 10/06/2013.

CLINICAL DATA: 61-year-old female with history of fever for the
past several days. History of interstitial lung disease status post
bilateral lung transplant in August 2016.

EXAM:
CHEST  2 VIEW

[chest pa]
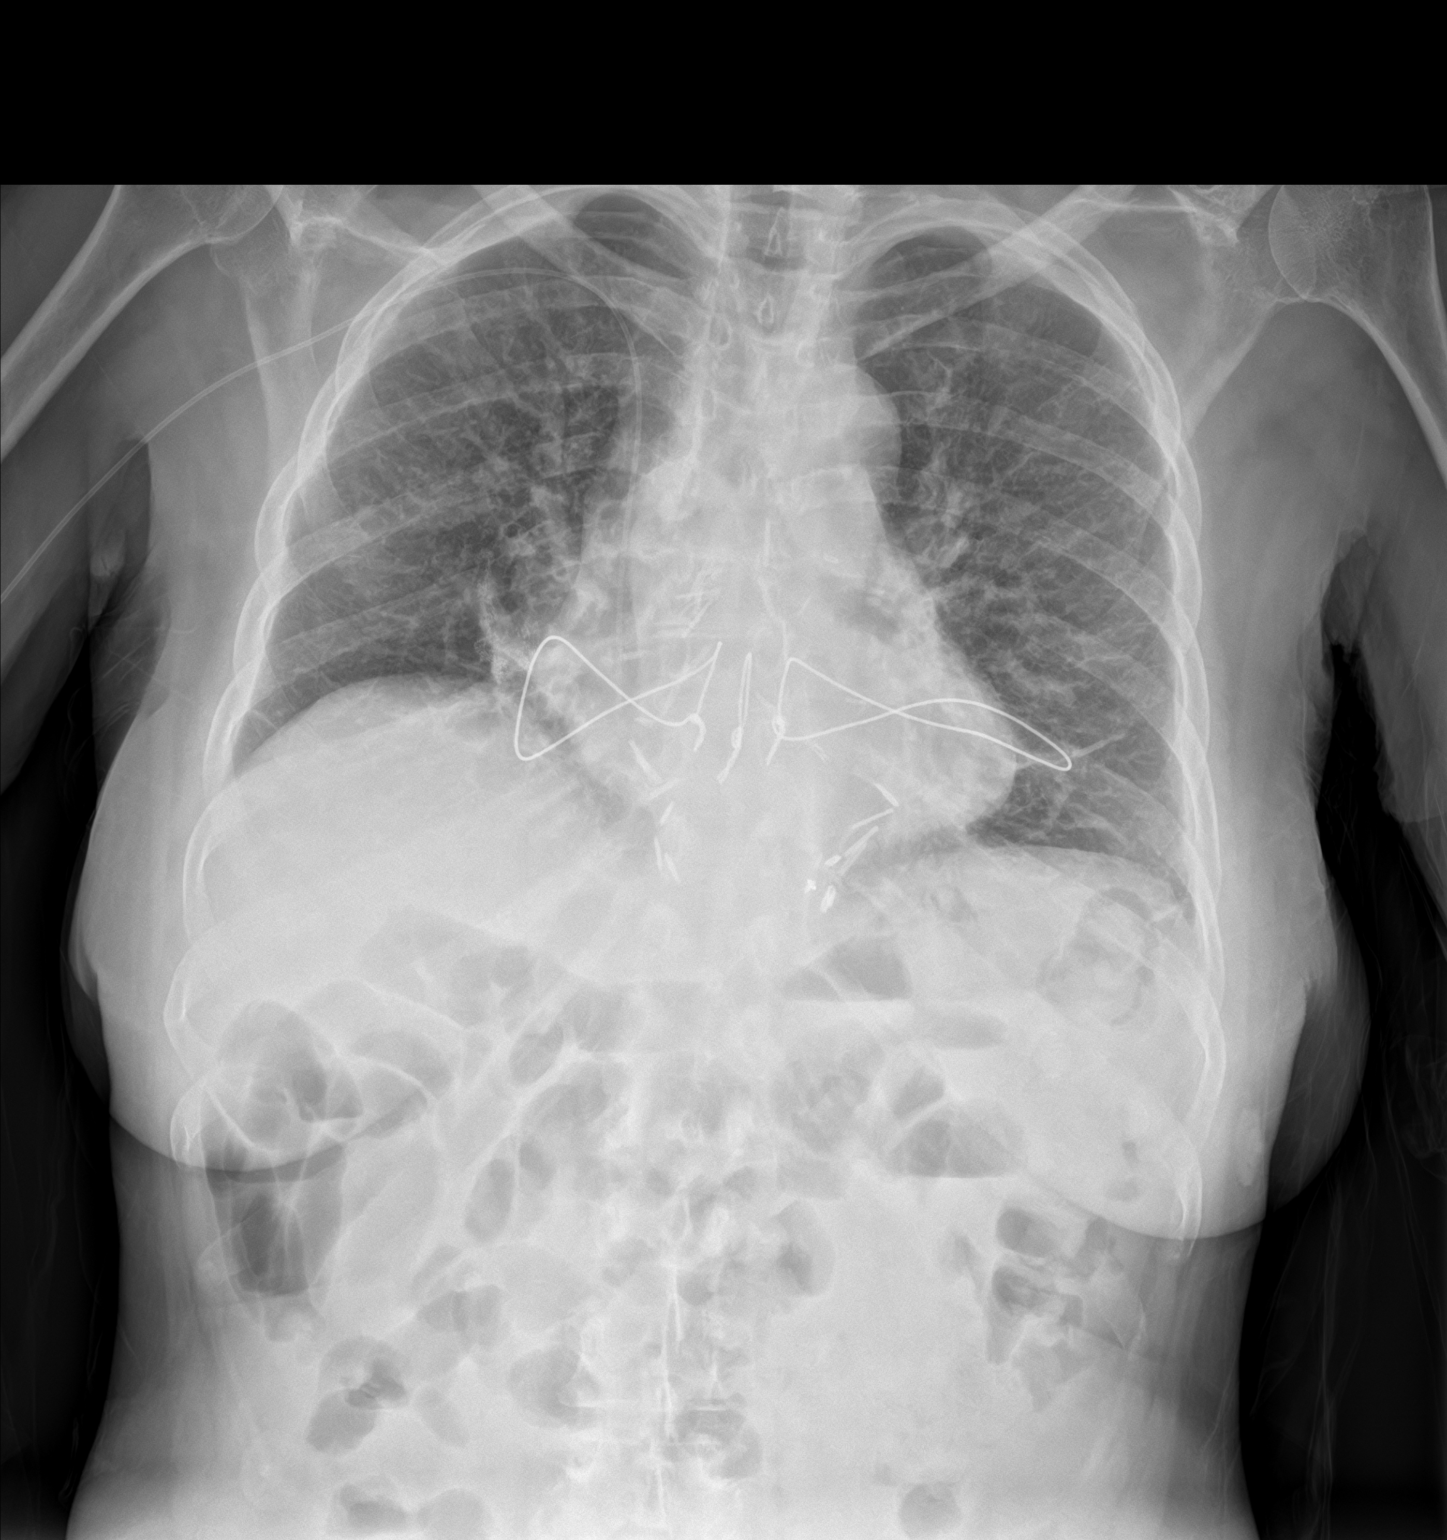

[chest lat]
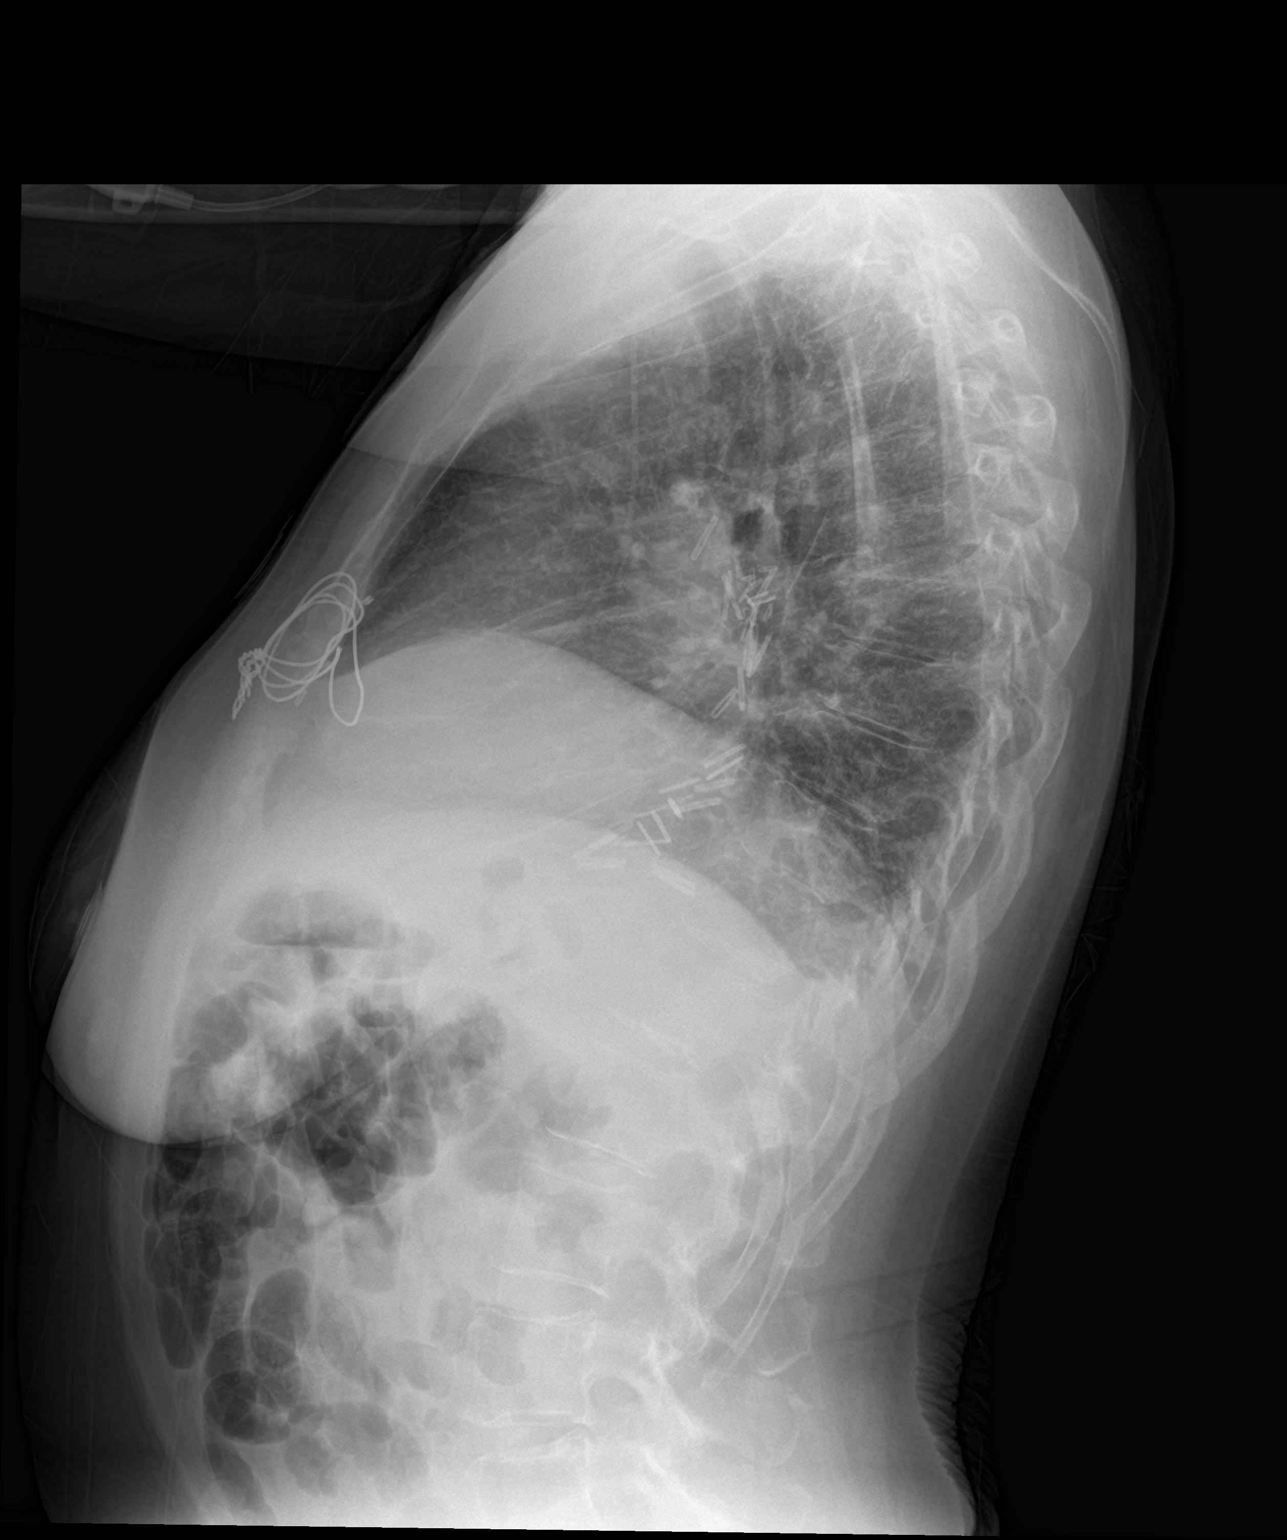

[2 of 2 positions shown; findings below may reference images not displayed]

FINDINGS: Postoperative changes of bilateral lung transplant are noted. Lung
volumes are normal. Slight coarsening of interstitial markings and
diffuse peribronchial cuffing. No consolidative airspace disease.
Mild blunting of the right costophrenic sulcus. No left pleural
effusion. No evidence of pulmonary edema. Heart size is normal.
Upper mediastinal contours are within normal limits. Right upper
extremity PICC with tip terminating at the superior cavoatrial
junction.
IMPRESSION: 1. Possible trace right pleural effusion.
2. Diffuse peribronchial cuffing and mild diffuse interstitial
prominence. These findings are of uncertain etiology and chronicity,
as no postoperative chest x-ray is available for comparison. Given
the acute presentation, the possibility of a bronchitis should be
considered.
3. Postoperative changes and support apparatus, as above.

## 2019-10-12 ENCOUNTER — Encounter: Payer: Self-pay | Admitting: FAMILY PRACTICE

## 2019-10-12 ENCOUNTER — Ambulatory Visit: Payer: Medicare PPO | Admitting: FAMILY PRACTICE

## 2019-10-12 VITALS — BP 123/74 | HR 100 | Temp 97.9°F | Wt 151.2 lb

## 2019-10-12 DIAGNOSIS — F419 Anxiety disorder, unspecified: Secondary | ICD-10-CM

## 2019-10-12 MED ORDER — CLONAZEPAM 0.25 MG DISINTEGRATING TABLET: 0.2500 mg | DISINTEGRATING_TABLET | Freq: Two times a day (BID) | ORAL | 0 refills | Status: DC | PRN

## 2019-10-12 NOTE — Progress Notes (Signed)
Date of Service: 10/12/2019; note started 2:52 PM    Patient WAS wearing a surgical mask  Standard precautions were followed when caring for the patient.   PPE used by provider during encounter: Surgical mask and Face Shield/Goggles    Chief Complaint: Amanda Lester is a 64yr old female who comes in today for anxiety    Subjective:  Presents with history episodic anxiety, using PRN clonazepam, approximately 30 tab/6 wk (20/month). Last refill approximately 3 wk ago. Lots of stress-- husband status post CVA, COPD. She believes he has re-started smoking cigarettes (he denies), "scares the hell out of me." Traveling to helkp daughter move,  will be out of town 5/11-25; worried about leaving husband for that length of time. Seeing psychotherapy. When anxious wakes up with headache, diarrhea, abdominal pain. Clonazepam works well. Taking BID when upset. Helps with sleep as well. Positive exercise 4d/wk, working with Physiological scientist. Prior Zoloft, caused insomnia; Lexapro caused "like a zombie." Needing clonazepam approximately 3d/wk, worse in past month. Has "just a couple" of pills remaining.     Review of Systems  See above-- integrated in Subjective section    Objective:  BP 123/74 (SITE: left arm, Orthostatic Position: sitting, Cuff Size: regular)   Pulse 100   Temp 36.6 C (97.9 F) (Temporal)   Wt 68.6 kg (151 lb 3.8 oz)   SpO2 99%   BMI 26.04 kg/m     PHQ-2 10/12/2019   Interest 0   Feeling 0   PHQ-2 Total Score 0       Physical Exam  Constitutional:       General: She is not in acute distress.     Appearance: She is well-developed.   Eyes:      General: No scleral icterus.  Skin:     General: Skin is warm and dry.      Findings: No rash.   Psychiatric:         Speech: Speech normal.         Behavior: Behavior is agitated (psychomotor).         Thought Content: Thought content normal. Thought content does not include homicidal or suicidal plan.         Cognition and Memory: Memory is not impaired (Insight  normal).         Judgment: Judgment normal.       ASSESSMENT AND PLAN:  (F41.9) Anxiety  (primary encounter diagnosis)  Comment: Recent exacerbation, refill pattern matches reported use.   Plan: Discussed that she would probably benefit from a chronic medication (likely SNRI given prior intolerance of SSRI x2).  Agrees to discuss at upcoming appointment 1 mo.       Ongoing medications at end of visit:  Clonazepam (KLONOPIN) 0.25 mg Disintegrating Tablet, Take 1 tablet by mouth two times daily if needed. (anxiety)  Omeprazole (PRILOSEC) 20 mg Delayed Release Capsule, Take 1 capsule by mouth once daily before a meal.    No current facility-administered medications for this visit.      I did review patient's past medical and family/social history, no changes noted.  Barriers to Learning assessed: none. Patient verbalizes understanding of teaching and instructions.    Renato Battles, MD  Attending Physician, Family Medicine  [facility]  Electronically signed 10/12/2019 5:14 PM

## 2019-10-12 NOTE — Nursing Note (Signed)
Patient verified using 2 identifiers.  Vitals taken, screened for allergies, pain and tobacco use.  Pharmacy verified.   Amanda Mccolm Kou Savilla Turbyfill, MA    Patient WAS wearing a surgical mask  Standard precautions were followed when caring for the patient.   PPE used by provider during encounter: Surgical mask and face shield

## 2019-11-08 ENCOUNTER — Ambulatory Visit: Payer: Self-pay | Admitting: FAMILY PRACTICE

## 2019-11-10 ENCOUNTER — Ambulatory Visit: Payer: Medicare PPO

## 2019-11-21 ENCOUNTER — Encounter: Payer: Self-pay | Admitting: FAMILY PRACTICE

## 2019-11-21 ENCOUNTER — Ambulatory Visit: Payer: Medicare PPO | Admitting: FAMILY PRACTICE

## 2019-11-21 VITALS — BP 126/77 | HR 84 | Temp 98.4°F | Wt 150.4 lb

## 2019-11-21 DIAGNOSIS — M25511 Pain in right shoulder: Secondary | ICD-10-CM

## 2019-11-21 DIAGNOSIS — M25512 Pain in left shoulder: Secondary | ICD-10-CM

## 2019-11-21 DIAGNOSIS — G8929 Other chronic pain: Secondary | ICD-10-CM

## 2019-11-21 DIAGNOSIS — F419 Anxiety disorder, unspecified: Secondary | ICD-10-CM

## 2019-11-21 MED ORDER — CLONAZEPAM 0.25 MG DISINTEGRATING TABLET: 0.2500 mg | DISINTEGRATING_TABLET | Freq: Two times a day (BID) | ORAL | 0 refills | Status: DC | PRN

## 2019-11-21 NOTE — Nursing Note (Signed)
Patient verified using 2 identifiers.  Vitals taken, screened for allergies, pain and tobacco use.  Pharmacy verified.  HM due: HM due topics reviewed. Patient is up to date.  Jancie Kercher, MA    Patient WAS wearing a surgical mask  Droplet precautions were followed when caring for the patient.   PPE used by provider during encounter: Surgical mask

## 2019-11-21 NOTE — Progress Notes (Signed)
Date of Service: 11/21/2019; note started 10:55 AM    Patient WAS wearing a surgical mask  Standard precautions were followed when caring for the patient.   PPE used by provider during encounter: Surgical mask    Chief Complaint: Amanda Lester is a 64yr old female who comes in today for anxiety    Subjective:  Presents with anxiety, taking clonazepam as needed. Positive meditation, exercise. Seeing psychotherapy. Prior use of sertraline, escitalopram, "made me feel icky." Anxiety in new vs much worse in past 2 yrs-- politics, retirememt, covid, hub with CVA. When having anxiety "get uptight," nervous, muscle tension, headache, early morning awakening, "mind goes a bazillion miles an hour."   Also with chronic low back pain, previously saw physical therapy. Now with tension in neck, shoulders, back. Shoulder pain seems related to anxiety.     Review of Systems  See above-- integrated in Subjective section    Objective:  BP 126/77 (SITE: left arm, Orthostatic Position: sitting, Cuff Size: regular)   Pulse 84   Temp 36.9 C (98.4 F) (Temporal)   Wt 68.2 kg (150 lb 5.7 oz)   SpO2 98%   BMI 25.89 kg/m     PHQ-2 11/21/2019   Interest 0   Feeling 0   PHQ-2 Total Score 0       Physical Exam  Constitutional:       General: She is not in acute distress.     Appearance: She is well-developed.   Eyes:      General: No scleral icterus.  Musculoskeletal:      Right shoulder: Normal.      Left shoulder: Normal.   Skin:     General: Skin is warm and dry.      Findings: No rash.   Psychiatric:         Mood and Affect: Mood is anxious.         Speech: Speech normal.         Behavior: Behavior is agitated (mildly).       ASSESSMENT AND PLAN:  (F41.9) Anxiety  (primary encounter diagnosis)  Comment: Currently controlled with low-dose clonazepam.  No evidence of side effects or aberrancy.  Plan: Physical Therapy Referral        Discussed that as a long-term medication clonazepam can cause substantial problems.  Continue current  medication for now, if still needing regular medication in 6 months then reconsider alternate antianxiety medications.    (M25.511,  G89.29,  M25.512) Chronic pain of both shoulders  Comment:   Plan: Physical Therapy Referral              Ongoing medications at end of visit:  Clonazepam (KLONOPIN) 0.25 mg Disintegrating Tablet, Take 1 tablet by mouth two times daily if needed. (anxiety)  Omeprazole (PRILOSEC) 20 mg Delayed Release Capsule, Take 1 capsule by mouth once daily before a meal.    No current facility-administered medications for this visit.      I did review patient's past medical and family/social history, no changes noted.  Barriers to Learning assessed: none. Patient verbalizes understanding of teaching and instructions.    Renato Battles, MD  Attending Physician, Family Medicine  [facility] [employer]  Electronically signed 11/21/2019 1:14 PM

## 2019-12-22 ENCOUNTER — Encounter: Payer: Self-pay | Admitting: FAMILY PRACTICE

## 2019-12-22 NOTE — Telephone Encounter (Signed)
From: Kristopher Glee  To: Renato Battles, MD  Sent: 12/20/2019 4:29 PM PDT  Subject: Non-urgent Medical Advice Question    Hi Doctor Oren Binet. At my last visit, you stated that you would refer me to physical therapy to finish up the appointments that were suspended due to [place] and to try to help with my neck and shoulder pain. I have not heard from PT yet, so not sure if I should contact them directly. Thanks

## 2020-01-01 ENCOUNTER — Encounter: Payer: Self-pay | Admitting: FAMILY PRACTICE

## 2020-01-02 MED ORDER — CLONAZEPAM 0.25 MG DISINTEGRATING TABLET: 0.2500 mg | DISINTEGRATING_TABLET | Freq: Two times a day (BID) | ORAL | 0 refills | Status: DC | PRN

## 2020-01-02 NOTE — Telephone Encounter (Signed)
Last fill date: 11/21/2019  Last office visit: 11/21/2019

## 2020-01-03 ENCOUNTER — Other Ambulatory Visit: Payer: Self-pay | Admitting: FAMILY PRACTICE

## 2020-01-03 ENCOUNTER — Ambulatory Visit
Admission: RE | Admit: 2020-01-03 | Discharge: 2020-01-03 | Disposition: A | Discharge status: Home / Self Care | Payer: Medicare PPO | Source: Ambulatory Visit | Attending: Radiologic Technologist | Admitting: Radiologic Technologist

## 2020-01-03 DIAGNOSIS — Z1231 Encounter for screening mammogram for malignant neoplasm of breast: Secondary | ICD-10-CM | POA: Insufficient documentation

## 2020-01-16 ENCOUNTER — Ambulatory Visit: Payer: Medicare PPO | Admitting: Rehabilitative and Restorative Service Providers"

## 2020-01-16 DIAGNOSIS — G8929 Other chronic pain: Secondary | ICD-10-CM

## 2020-01-16 DIAGNOSIS — M25511 Pain in right shoulder: Secondary | ICD-10-CM

## 2020-01-16 DIAGNOSIS — F419 Anxiety disorder, unspecified: Secondary | ICD-10-CM

## 2020-01-16 DIAGNOSIS — M25512 Pain in left shoulder: Secondary | ICD-10-CM

## 2020-01-16 NOTE — Progress Notes (Signed)
PHYSICAL THERAPY EVALUATION 01/16/2020     Diagnosis:  Therapy diagnosis:   1. Chronic pain of both shoulders    2. Anxiety       Medical diagnosis:   Anxiety [F41.9] - Primary       Chronic pain of both shoulders [M25.511, G89.29, M25.512]           Authorizing MD (First and last name) and PI#: Margaretha Seeds, MD  NPI #: [id number]  Onset Date: 11/21/2019     Start of Care Date: 01/16/2020   Prior Level of function: able to   Prior treatment for this diagnosis: no  Rehab potential: Good    Precautions: history of adenoma of colon    Certification period end date (for Medicare patients): 04/15/2020  Estimated Discharge Date: 04/15/2020    Plan of care:  Established 01/16/2020  Plan of Care: 2 time(s) a week for a total of up to 12 (authorized) treatments over 12 weeks  Therapeutic Procedure  Neuromuscular Re-education  Therapeutic Activity  Traction  Deep Soft Tissue Massage  Joint Mobilization, gentle and general if precautions indicate  Gait Training  Modalities: hot packs, cold packs, ultrasound and electrical stimulation  SLEEK Core Movement Retraining Class Level I or II  Home Exercise Program  Patient/Caregiver Education    Goals:   Improve scapula(r) position and core rotation to improve resting position and general range of motion in order to: - partially met    1) Work on computer 30 minute(s) 4/10 or less pain    2) Reduce AM stress headache frequency to 1/14 or better  Patient independent in home exercise program to be able to self manage symptoms to improve and maintain function - partially met     Patient WAS wearing a surgical mask  Droplet precautions were followed when caring for the patient.   PPE used by provider during encounter: Surgical mask and Face Shield/Goggles    Total Evaluation time: 30 minute(s)    Treatment time in addition to evaluation: 15 minute(s)          Pre pain: 2/10  Post pain:  NA/10    Clinical Evaluation     SUBJECTIVE: married. Husband had stroke at beginning of 2020,  now walking 10,000 steps/day, 3 to 4000-step walk (30 minute(s)).    History of current problem:                                   Amanda Lester is a 64yrold female referred to physical therapy due to anxiety and chronic bilateral shoulder pain.     11/21/2019 MD Visit: "Presents with anxiety, taking clonazepam as needed. Positive meditation, exercise. Seeing psychotherapy. Prior use of sertraline, escitalopram, "made me feel icky." Anxiety in new vs much worse in past 2 yrs-- politics, retirememt, covid, hub with CVA. When having anxiety "get uptight," nervous, muscle tension, headache, early morning awakening, "mind goes a bazillion miles an hour."   Also with chronic low back pain, previously saw physical therapy. Now with tension in neck, shoulders, back. Shoulder pain seems related to anxiety."    Aggravation//Ease:  1) computer 30 minute(s) 4/10 // Tylenol, rub it     24-hour: AM: will wake up with stress headache 1/7 if had stressful (worry) day before // massage therapy, goes once monthly                Day:  Night: okay //          Pain ranges from   See image above    Limitations in activities of daily living include: See Aggravation/Ease above     Medical History/Co-morbidities effecting the plan of care:   Electronic Medical Record Reviewed: Including radiologist reports of associated images and scans:  NA    No past medical history on file.    Past Surgical History:   Procedure Laterality Date    PR CESAREAN DELIVERY ONLY  1974    C-section, low cervical    PR COLONOSCOPY FLX W/ENDOSCOPIC MUCOSAL RESECTION  01/21/2019         PR COLONOSCOPY FLX W/ENDOSCOPIC MUCOSAL RESECTION  01/24/2019                Social History/Personal and/or environmental factors effecting the plan of care:  Exercise: 4 times/week with different friends on FaceTime since March 2020: weights for arms, rows upright and backward, ball balance: bird dog, calisthenics/HIIT type exercises, tree pose, etc.    Social  History     Social History Narrative    Not on file       SPECIAL QUESTIONS:   Any unexplained weight loss? no   History of Rheumatoid arthritis? no   Anticoagulant or long term steroid? no   Dizziness? no   Cord Symptoms? (bilateral numbness/tingling, bowel/bladder changes, poor balance) no    OBJECTIVE EXAM:  Appearance:   healthy, alert and cooperative    Posture:  Standing posture: see red in image     Inspection/Palpation:   Mild decrease(d) lumbar spine lordosis   See red in image     Range of motion:   Cervical Spine 01/16/2020     Normal  Other Measurements   Flexion  45 "stretches""feels better" 45   inches (chin to sternum)   Extension  45 no change 45          Left Right Normal Other measurement techniques Left  Right    Side Bending    40   inches (ear to clavicle)     Rotation  60P2 45 P1 70   inches (chin to clavicle)                              Shoulder Range of Motion: NA    Other range of motion tested: No other range of motion testing done at this time      Strength:   MMT Strength out of 5   Left  Right    Elbow Flexion  (C5)     Wrist Extension (C6)     Elbow Extension (C7)     Finger Flexors (C8)     Small Finger Abductors (T1)       General Core stability:  Fair  General Scapular stability:  Fair    Other strength testing: No other strength testing at this time       Segmental Testing: (note abnormality)   Cervical Spine: Not tested at this time    Upper thoracic spine: Not tested at this time    Neurological screens/ Special tests:    NA    Treatment today in addition to evaluation (if applicable):    08/09/9922:  Neuromuscular Re-education (15 minutes):   With moderate verbal and tactile cueing throughout by therapist for engaging core muscles for neutral spinal and proximal alignment and for proper biomechanics:  Standing forearm plank with  left lower extremity extension and hold // cervical spine right rotation equal to left     Explained problem to patient. Patient was  educated in the anatomy and physiology involved with visual illustration, skeletal model and/or anatomical diagrams/drawings. Common symptoms, treatments, responses to treatment, and self management were discussed with the patient.     ASSESSMENT: The patient presents with pain and decreased function due to cervical dysfunction. Signs and symptoms are consistent with myofascial +/- cervical facet pain. Patient presents in scapula(r) abduction and depression, moderate, pelvic right rotation, lumbar left rotation. Patient needs continued physical therapy to improve spinal and overall body alignment; restore flexibility, balance, and strength to supporting muscles to improve pain-free range of motion to be able to perform ADLs.      Clinical Presentation: Stable/Uncomplexed     The components of this evaluation necessitated a Moderate complexity level of clinical decision making.      Plan for next visit: soft tissue mobilization to lats and self stretch in sleep, sit / computer position. Tape prn. Scapula(r) retraction. Progress standing 4-point: bird dog.   (see plan of care at top of note for general plan details)    In the event of unattended discharge, this note will serve as discharge document      Patient Education:      Method of Teaching: demonstration, verbal  and written hand-out     Learner: patient     Response: verbalizes understanding and able to give return demonstration       Has the plan of care been explained to the patient/caregiver? yes       Is the patient able to understand the plan of care: yes       Does the patient/caregiver(s) agree with the plan of care: yes       Are there barriers to learning? no.         Motivated to learn? yes       Best learning method: verbal, or demo      Patient/Family participation and agreement with recommendations:  verbalized agreement    Is patient currently receiving outside care for this problem:     Home Health care services: no  Skilled nursing facility: no    Other no    Precious Gilding, [place]  Pager [phone]

## 2020-02-08 ENCOUNTER — Ambulatory Visit: Payer: Medicare PPO | Admitting: Rehabilitative and Restorative Service Providers"

## 2020-02-09 ENCOUNTER — Ambulatory Visit: Payer: Medicare PPO | Admitting: Rehabilitative and Restorative Service Providers"

## 2020-02-09 DIAGNOSIS — M25511 Pain in right shoulder: Secondary | ICD-10-CM

## 2020-02-09 DIAGNOSIS — F419 Anxiety disorder, unspecified: Secondary | ICD-10-CM

## 2020-02-09 DIAGNOSIS — G8929 Other chronic pain: Secondary | ICD-10-CM

## 2020-02-09 DIAGNOSIS — M25512 Pain in left shoulder: Secondary | ICD-10-CM

## 2020-02-09 NOTE — Progress Notes (Signed)
Physical Therapy Treatment Note: 02/09/2020       Diagnosis:  Therapy diagnosis:   1. Chronic pain of both shoulders    2. Anxiety       Medical diagnosis:   Anxiety [F41.9] - Primary       Chronic pain of both shoulders [M25.511, G89.29, M25.512]           Authorizing MD (First and last name) and PI#: Margaretha Seeds, MD  NPI #: [id number]  Onset Date: 11/21/2019     Start of Care Date: 01/16/2020   Prior Level of function: able to   Prior treatment for this diagnosis: no  Rehab potential: Good    Precautions: history of adenoma of colon    Certification period end date (for Medicare patients): 04/15/2020  Estimated Discharge Date: 04/15/2020    Plan of care:  Established 01/16/2020  Plan of Care: 2 time(s) a week for a total of up to 12 (authorized) treatments over 12 weeks  Therapeutic Procedure  Neuromuscular Re-education  Therapeutic Activity  Traction  Deep Soft Tissue Massage  Joint Mobilization, gentle and general if precautions indicate  Gait Training  Modalities: hot packs, cold packs, ultrasound and electrical stimulation  SLEEK Core Movement Retraining Class Level I or II  Home Exercise Program  Patient/Caregiver Education    Goals:   Improve scapula(r) position and core rotation to improve resting position and general range of motion in order to: - partially met    1) Work on computer 30 minute(s) 4/10 or less pain    2) Reduce AM stress headache frequency to 1/14 or better  Patient independent in home exercise program to be able to self manage symptoms to improve and maintain function - partially met     Patient WAS wearing a surgical mask  Droplet precautions were followed when caring for the patient.   PPE used by provider during encounter: Surgical mask and Face Shield/Goggles         Total # of visits: 2/12    Total Treatment Time: 40 minute(s)     Pre pain: 2-3/10 right trapezius upper and middle  Post pain:       NA/10    Subjective: Did exercises x first couple of days only. Hasn't done  since. Forgot. Is exercising with trainer.     Objective:   Pre-treatment:   Range of motion:   Cervical Spine 01/16/2020  02/09/2020      Normal  Other Measurements   Flexion  45 "stretches""feels better"/10 tight 45   inches (chin to sternum)   Extension  45 no change/45  45          Left Right Normal Other measurement techniques Left  Right    Side Bending    40   inches (ear to clavicle)     Rotation  60P2/55 no change 45 P1 (first onset or increase of pain)/55 pain right  70   inches (chin to clavicle)                  02/09/2020:  Cephalad glides markedly restricted especially distal lats   Caudal glides free and without restrictions                Patient seen for the following treatment // reassessment  02/09/2020:  Soft tissue mobilization with Neuromuscular Re-education (30 minutes):   Reviewed home exercise program with patient demonstration and cueing to avoid pelvic left rotation. Progressed to bird dog with right upper  extremity reach.  MFD to lats bilaterally with glides in cephalad direction and clockwise/counterclockwise as needed   Followed by deep diaphragmatic breathing // cervical spine flexion 55 degrees, right rotation 70 degrees, left rotation 70 degrees   Discussed overhead arm stretch or 'hang' to promote latissimus stretch  Discussed and presented options for own cupping kit - [employer] or [employer]     Therapeutic activity (10 minutes):   To reinforce above, sleep, sit with scapula(r) elevation, use arm rests in chair  Discussed adequate sleep to unload cervical discs and decrease(d) compression in upright      01/16/2020:  Neuromuscular Re-education (15 minutes):   With moderate verbal and tactile cueing throughout by therapist for engaging core muscles for neutral spinal and proximal alignment and for proper biomechanics:  Standing forearm plank with left lower extremity extension and hold // cervical spine right rotation equal to left     Explained problem to patient. Patient was educated  in the anatomy and physiology involved with visual illustration, skeletal model and/or anatomical diagrams/drawings. Common symptoms, treatments, responses to treatment, and self management were discussed with the patient.     ASSESSMENT: The patient presents with pain and decreased function due to cervical dysfunction. Signs and symptoms are consistent with myofascial +/- cervical facet pain. Patient presents in scapula(r) abduction and depression, moderate, pelvic right rotation, lumbar left rotation. Patient needs continued physical therapy to improve spinal and overall body alignment; restore flexibility, balance, and strength to supporting muscles to improve pain-free range of motion to be able to perform ADLs.      Plan: continue physical therapy - as per plan of care. computer position. Repeat soft tissue mobilization to lats. Review self stretch in sleep, sit. Tape prn. Scapula(r) retraction.     Any changes to plan of care, or goals: no   If so then plan of care will be sent to MD for approval.     In the event of unattended discharge, this note will serve as discharge document    Precious Gilding, [place], PT, CCS Paneled Therapist  [place] [phone]  Pager [phone]

## 2020-02-20 ENCOUNTER — Ambulatory Visit: Payer: Medicare PPO | Admitting: Rehabilitative and Restorative Service Providers"

## 2020-02-20 DIAGNOSIS — G8929 Other chronic pain: Secondary | ICD-10-CM

## 2020-02-20 DIAGNOSIS — M25512 Pain in left shoulder: Secondary | ICD-10-CM

## 2020-02-20 DIAGNOSIS — F419 Anxiety disorder, unspecified: Secondary | ICD-10-CM

## 2020-02-20 DIAGNOSIS — M25511 Pain in right shoulder: Secondary | ICD-10-CM

## 2020-02-20 NOTE — Progress Notes (Signed)
Physical Therapy Treatment Note: 02/20/2020       Diagnosis:  Therapy diagnosis:   1. Chronic pain of both shoulders    2. Anxiety       Medical diagnosis:   Anxiety [F41.9] - Primary       Chronic pain of both shoulders [M25.511, G89.29, M25.512]           Authorizing MD (First and last name) and PI#: Margaretha Seeds, MD  NPI #: [id number]  Onset Date: 11/21/2019     Start of Care Date: 01/16/2020   Prior Level of function: able to   Prior treatment for this diagnosis: no  Rehab potential: Good    Precautions: history of adenoma of colon    Certification period end date (for Medicare patients): 04/15/2020  Estimated Discharge Date: 04/15/2020    Plan of care:  Established 01/16/2020  Plan of Care: 2 time(s) a week for a total of up to 12 (authorized) treatments over 12 weeks  Therapeutic Procedure  Neuromuscular Re-education  Therapeutic Activity  Traction  Deep Soft Tissue Massage  Joint Mobilization, gentle and general if precautions indicate  Gait Training  Modalities: hot packs, cold packs, ultrasound and electrical stimulation  SLEEK Core Movement Retraining Class Level I or II  Home Exercise Program  Patient/Caregiver Education    Goals:   Improve scapula(r) position and core rotation to improve resting position and general range of motion in order to: - partially met    1) Work on computer 30 minute(s) 4/10 or less pain - partially met 5/10    2) Reduce AM stress headache frequency to 1/14 or better - 1-2/7, not met  Patient independent in home exercise program to be able to self manage symptoms to improve and maintain function - partially met     Patient WAS wearing a surgical mask  Droplet precautions were followed when caring for the patient.   PPE used by provider during encounter: Surgical mask and Face Shield/Goggles         Total # of visits: 3/12    Total Treatment Time: 30 minute(s)     Pre pain: 2/10 right trapezius upper and middle  Post pain:       2/10    Subjective: Didn't notice any  difference after last treatment(s). Goal(s) reviewed - see above.     Objective:   Pre-treatment:   Range of motion:   Cervical Spine 01/16/2020  02/09/2020  02/20/2020     Normal  Other Measurements   Flexion  45 "stretches""feels better"/10 tight/40 45   inches (chin to sternum)   Extension  45 no change/45/40  45          Left Right Normal Other measurement techniques Left  Right    Side Bending    40   inches (ear to clavicle)     Rotation  60P2/55 no change/55 45 P1 (first onset or increase of pain)/55 pain right/50  70   inches (chin to clavicle)                  02/09/2020:  Cephalad glides markedly restricted especially distal lats   Caudal glides free and without restrictions                Patient seen for the following treatment // reassessment:  02/20/2020:   Neuromuscular Re-education (30 minutes):   With moderate verbal and tactile cueing throughout by therapist for engaging core muscles for neutral spinal and proximal alignment  and for proper biomechanics, patient performed, then texted link with patient permission and preference:    Access Code: [email or web address]  URL: [email or web address]  Date: 02/20/2020  Prepared by: Fredia Beets    Exercises  Scapular Retraction with Resistance - 30 x daily - 6 x weekly in single limb stance right, then left // cervical spine flexion 50 degrees, extension 45 degrees, right rotation 58 degrees, left rotation 52 degrees     Reviewed patient's home exercise program via patient demonstration  Doing forward bent rows with 5 lbs - instructed to use 2 lbs  Overhead triceps extension with 5 lbs - instructed to do 3 lbs   Forward bent triceps extension with 5 lbs - instructed to squeeze shoulder blades back   Instructed to watch scapula(r) position for all.    02/09/2020:  Soft tissue mobilization with Neuromuscular Re-education (30 minutes):   Reviewed home exercise program with patient demonstration and cueing to avoid pelvic left rotation. Progressed to bird dog with  right upper extremity reach.  MFD to lats bilaterally with glides in cephalad direction and clockwise/counterclockwise as needed   Followed by deep diaphragmatic breathing // cervical spine flexion 55 degrees, right rotation 70 degrees, left rotation 70 degrees   Discussed overhead arm stretch or 'hang' to promote latissimus stretch  Discussed and presented options for own cupping kit - [employer] or [employer]     Therapeutic activity (10 minutes):   To reinforce above, sleep, sit with scapula(r) elevation, use arm rests in chair  Discussed adequate sleep to unload cervical discs and decrease(d) compression in upright      01/16/2020:  Neuromuscular Re-education (15 minutes):   With moderate verbal and tactile cueing throughout by therapist for engaging core muscles for neutral spinal and proximal alignment and for proper biomechanics:  Standing forearm plank with left lower extremity extension and hold // cervical spine right rotation equal to left     Explained problem to patient. Patient was educated in the anatomy and physiology involved with visual illustration, skeletal model and/or anatomical diagrams/drawings. Common symptoms, treatments, responses to treatment, and self management were discussed with the patient.     ASSESSMENT: The patient presents with pain and decreased function due to cervical dysfunction. Signs and symptoms are consistent with myofascial +/- cervical facet pain. Patient presents in scapula(r) abduction and depression, moderate, pelvic right rotation, lumbar left rotation. Patient needs continued physical therapy to improve spinal and overall body alignment; restore flexibility, balance, and strength to supporting muscles to improve pain-free range of motion to be able to perform ADLs.      Plan: continue physical therapy - as per plan of care. computer position. Repeat soft tissue mobilization to lats. Review self stretch in sleep, sit. Tape prn. Scapula(r) retraction.     Any changes to plan  of care, or goals: no   If so then plan of care will be sent to MD for approval.     In the event of unattended discharge, this note will serve as discharge document    Precious Gilding, [place], PT, CCS Paneled Therapist  [place] [phone]  Pager [phone]

## 2020-02-21 ENCOUNTER — Encounter: Payer: Self-pay | Admitting: FAMILY PRACTICE

## 2020-02-21 ENCOUNTER — Ambulatory Visit: Payer: Medicare PPO | Admitting: FAMILY PRACTICE

## 2020-02-21 VITALS — BP 133/78 | HR 82 | Temp 97.9°F | Wt 152.1 lb

## 2020-02-21 DIAGNOSIS — G8929 Other chronic pain: Secondary | ICD-10-CM

## 2020-02-21 DIAGNOSIS — M542 Cervicalgia: Secondary | ICD-10-CM

## 2020-02-21 DIAGNOSIS — F419 Anxiety disorder, unspecified: Secondary | ICD-10-CM

## 2020-02-21 MED ORDER — CLONAZEPAM 0.25 MG DISINTEGRATING TABLET: 0.2500 mg | DISINTEGRATING_TABLET | Freq: Two times a day (BID) | ORAL | 0 refills | Status: DC | PRN

## 2020-02-21 NOTE — Patient Instructions (Signed)
Glucosamine and chondroitin are herbal supplements that are available without a prescription.  They have been shown to significantly decrease pain for many people who have arthritis.  As with all treatments for arthritis, consistency of use is important. As with all herbal medications, the optimal dose is unknown, as are side effects and interactions with other medications, so use with caution.  If you develop any significant side effects to these medications you should make a return appointment.     Tylenol is available over-the-counter, and is very effective to arthritis pain. Most people do not get drowsy or upset stomach with this medicine. It is available in 325mg ,  500mg  (Tylenol Extra Strength) and 650mg  (Tylenol 8-Hour) doses. The usual dose is 650-1000mg , every 4-6 house as needed. There is no benefit in taking more than 1000mg  (3 regular or 2 Extra Strength tabs) at one time.  You should never take more than 3000 mg (9 regular, 6 Extra Strength, or 4 8-Hour tabs) in a 24-hour period; people with liver disease should take less, or none at all.  If you have liver disease, you should discuss use of Tylenol with your doctor before you start taking it. If you have any significant side effects, you should call your doctor.

## 2020-02-21 NOTE — Nursing Note (Signed)
Patient verified using 2 identifiers.  Vitals taken, screened for allergies, pain and tobacco use.  Pharmacy verified.    Jaylynn Mcaleer, MA    Patient WAS wearing a surgical mask  Droplet precautions were followed when caring for the patient.   PPE used by provider during encounter: Surgical mask

## 2020-02-21 NOTE — Progress Notes (Signed)
Date of Service: 02/21/2020; note started 9:12 AM    Patient WAS wearing a surgical mask  Standard and Droplet precautions were followed when caring for the patient.   PPE used by provider during encounter: Surgical mask and Face Shield/Goggles    Chief Complaint: Amanda Lester is a 64yr old female who comes in today for anxiety    Subjective:  Presents with chronic pain in neck and shoulders. Seeing physical therapy. Awoke this a.m. with improved range of motion. "Like we've turned a corner." Doing home exercises. Exercise with personal trainer weekly, we friends 4x/wk. Pain also left thumb, low back.   Also with anxiety, improving. Using clonazepam as needed. "Not worrying as much." Has approximately 5-6 tabs remaining.Rarely taking 2 tabs in a day. Positive psychotherapy every other week. Helping. No significant side effects of medications. No drowsiness, no memory problems.     Review of Systems  See above-- integrated in Subjective section     Fall risks Assessment:   No falls in past year. No throw rugs, poorly lighted areas, missing rails/grab bars in the home. No medications or conditions that affect stability or mobility.      Objective:  BP 133/78 (SITE: left arm, Orthostatic Position: sitting, Cuff Size: regular)   Pulse 82   Temp 36.6 C (97.9 F) (Temporal)   Wt 69 kg (152 lb 1.9 oz)   SpO2 99%   BMI 26.19 kg/m     PHQ-2 11/21/2019   Interest 0   Feeling 0   PHQ-2 Total Score 0       Physical Exam  Constitutional:       General: She is not in acute distress.     Appearance: She is well-developed.   Eyes:      General: No scleral icterus.  Skin:     General: Skin is warm and dry.      Findings: No rash.   Psychiatric:         Speech: Speech normal.         Thought Content: Thought content normal. Thought content does not include homicidal or suicidal plan.         Cognition and Memory: Memory is not impaired (Insight normal).         Judgment: Judgment normal.       ASSESSMENT AND PLAN:  (F41.9)  Anxiety  (primary encounter diagnosis)  Comment: Decreased frequency of clonazepam use by refill history.   Plan: Continue psychotherapy. Refilled medications. Follow up 3 mos.    (M54.2,  G89.29) Chronic neck pain  Comment:   Plan: Stable on current treatment regimen.  Continue.  Contact office if any problems.       Ongoing medications at end of visit:  Clonazepam (KLONOPIN) 0.25 mg Disintegrating Tablet, Take 1 tablet by mouth two times daily if needed. (anxiety)  Omeprazole (PRILOSEC) 20 mg Delayed Release Capsule, Take 1 capsule by mouth once daily before a meal.    No current facility-administered medications for this visit.      I did review patient's past medical and family/social history, no changes noted.  Barriers to Learning assessed: none. Patient verbalizes understanding of teaching and instructions.    Renato Battles, MD  Attending Physician, Family Medicine  [employer] [employer]  Electronically signed 02/21/2020 12:07 PM

## 2020-02-24 ENCOUNTER — Encounter: Payer: Self-pay | Admitting: FAMILY PRACTICE

## 2020-02-24 NOTE — Telephone Encounter (Signed)
From: Kristopher Glee  To: Renato Battles, MD  Sent: 02/24/2020 1:02 PM PDT  Subject: Flu and COVID booster shots    I forgot to ask you when should we be getting our annual flu shot? Should we make appointments? Also any ideas as to when should we get the COVID booster shot? Thanks so much.

## 2020-02-28 ENCOUNTER — Ambulatory Visit: Payer: Medicare PPO | Admitting: Rehabilitative and Restorative Service Providers"

## 2020-02-28 DIAGNOSIS — M25511 Pain in right shoulder: Secondary | ICD-10-CM

## 2020-02-28 DIAGNOSIS — F419 Anxiety disorder, unspecified: Secondary | ICD-10-CM

## 2020-02-28 DIAGNOSIS — G8929 Other chronic pain: Secondary | ICD-10-CM

## 2020-02-28 DIAGNOSIS — M25512 Pain in left shoulder: Secondary | ICD-10-CM

## 2020-02-28 NOTE — Progress Notes (Signed)
Physical Therapy Treatment Note: 02/28/2020        Diagnosis:  Therapy diagnosis:   1. Chronic pain of both shoulders    2. Anxiety       Medical diagnosis:   Anxiety [F41.9] - Primary       Chronic pain of both shoulders [M25.511, G89.29, M25.512]           Authorizing MD (First and last name) and PI#: Margaretha Seeds, MD  NPI #: [id number]  Onset Date: 11/21/2019     Start of Care Date: 01/16/2020   Prior Level of function: able to   Prior treatment for this diagnosis: no  Rehab potential: Good    Precautions: history of adenoma of colon    Certification period end date (for Medicare patients): 04/15/2020  Estimated Discharge Date: 04/15/2020    Plan of care:  Established 01/16/2020  Plan of Care: 2 time(s) a week for a total of up to 12 (authorized) treatments over 12 weeks  Therapeutic Procedure  Neuromuscular Re-education  Therapeutic Activity  Traction  Deep Soft Tissue Massage  Joint Mobilization, gentle and general if precautions indicate  Gait Training  Modalities: hot packs, cold packs, ultrasound and electrical stimulation  [employer] Core Movement Retraining Class Level I or II  Home Exercise Program  Patient/Caregiver Education    Goals:   Improve scapula(r) position and core rotation to improve resting position and general range of motion in order to: - partially met    1) Work on computer 30 minute(s) 4/10 or less pain - goal(s) met <4/10    2) Reduce AM stress headache frequency to 1/14 or better - goal(s) met  Patient independent in home exercise program to be able to self manage symptoms to improve and maintain function - partially met     Patient WAS wearing a surgical mask  Droplet precautions were followed when caring for the patient.   PPE used by provider during encounter: Surgical mask and Face Shield/Goggles         Total # of visits: 4/12    Total Treatment Time: 30 minute(s)     Pre pain: 4-5/10 right trapezius upper and middle  Post pain:       "almost gone"/10    Subjective: scapula(r)  retraction exercises help. Woke up really stiff today, slept really hard, 8-9 hours. Goal(s) reviewed - see above.    Objective:   Pre-treatment:   Range of motion:   Cervical Spine 01/16/2020  02/09/2020  02/20/2020  02/28/2020     Normal  Other Measurements   Flexion  45 "stretches""feels better"/10 tight/40 45   inches (chin to sternum)   Extension  45 no change/45/40  45          Left Right Normal Other measurement techniques Left  Right    Side Bending    40   inches (ear to clavicle)     Rotation  60P2/55 no change/55/55 45 P1 (first onset or increase of pain)/55 pain right/50/50 70   inches (chin to clavicle)                  02/09/2020:  Cephalad glides markedly restricted especially distal lats   Caudal glides free and without restrictions                Patient seen for the following treatment // reassessment:  02/28/2020:  Therapeutic activity (20 minutes):    Discussed and demonstrated computer position   In standing, lower desk  to accommodate shoulder neutral and elbow 90 degrees   With change in desk height, computer height   Gradual change. Neutral MOST of time, not necessarily all the time. Asymmetrical positions okay as long as balanced out.   Soft tissue mobilization (15 minutes):   Soft tissue mobilization to lateral in caudad direction bilaterally // resting pain nearly abolished, cervical spine right rotation and left rotation approximately 70 degrees   Lats stretch in supine or sit, also in down dog position.    02/20/2020:   Neuromuscular Re-education (30 minutes):   With moderate verbal and tactile cueing throughout by therapist for engaging core muscles for neutral spinal and proximal alignment and for proper biomechanics, patient performed, then texted link with patient permission and preference:    Access Code: [email or web address]  URL: [email or web address]  Date: 02/20/2020  Prepared by: Fredia Beets    Exercises  Scapular Retraction with Resistance - 30 x daily - 6 x weekly in single limb  stance right, then left // cervical spine flexion 50 degrees, extension 45 degrees, right rotation 58 degrees, left rotation 52 degrees     Reviewed patient's home exercise program via patient demonstration  Doing forward bent rows with 5 lbs - instructed to use 2 lbs  Overhead triceps extension with 5 lbs - instructed to do 3 lbs   Forward bent triceps extension with 5 lbs - instructed to squeeze shoulder blades back   Instructed to watch scapula(r) position for all.    02/09/2020:  Soft tissue mobilization with Neuromuscular Re-education (30 minutes):   Reviewed home exercise program with patient demonstration and cueing to avoid pelvic left rotation. Progressed to bird dog with right upper extremity reach.  MFD to lats bilaterally with glides in cephalad direction and clockwise/counterclockwise as needed   Followed by deep diaphragmatic breathing // cervical spine flexion 55 degrees, right rotation 70 degrees, left rotation 70 degrees   Discussed overhead arm stretch or 'hang' to promote latissimus stretch  Discussed and presented options for own cupping kit - [employer] or [employer]     Therapeutic activity (10 minutes):   To reinforce above, sleep, sit with scapula(r) elevation, use arm rests in chair  Discussed adequate sleep to unload cervical discs and decrease(d) compression in upright      01/16/2020:  Neuromuscular Re-education (15 minutes):   With moderate verbal and tactile cueing throughout by therapist for engaging core muscles for neutral spinal and proximal alignment and for proper biomechanics:  Standing forearm plank with left lower extremity extension and hold // cervical spine right rotation equal to left     Explained problem to patient. Patient was educated in the anatomy and physiology involved with visual illustration, skeletal model and/or anatomical diagrams/drawings. Common symptoms, treatments, responses to treatment, and self management were discussed with the patient.     ASSESSMENT: The  patient presents with pain and decreased function due to cervical dysfunction. Signs and symptoms are consistent with myofascial +/- cervical facet pain. Patient presents in scapula(r) abduction and depression, moderate, pelvic right rotation, lumbar left rotation. Patient needs continued physical therapy to improve spinal and overall body alignment; restore flexibility, balance, and strength to supporting muscles to improve pain-free range of motion to be able to perform ADLs.      Plan: continue physical therapy - as per plan of care. return to clinic in 2 weeks for probable final. Review self stretch in sleep, sit. Tape prn. Then, probable discharge.    Any changes to plan of care, or  goals: no   If so then plan of care will be sent to MD for approval.     In the event of unattended discharge, this note will serve as discharge document    Precious Gilding, [place], PT, CCS Paneled Therapist  [place] [phone]  Pager [phone]

## 2020-03-06 ENCOUNTER — Ambulatory Visit: Payer: Medicare PPO | Admitting: Rehabilitative and Restorative Service Providers"

## 2020-03-07 ENCOUNTER — Ambulatory Visit: Payer: Medicare PPO

## 2020-03-07 ENCOUNTER — Encounter: Payer: Self-pay | Admitting: FAMILY PRACTICE

## 2020-03-07 DIAGNOSIS — Z23 Encounter for immunization: Secondary | ICD-10-CM

## 2020-03-07 NOTE — Telephone Encounter (Signed)
From: Kristopher Glee  To: Renato Battles, MD  Sent: 03/07/2020 10:49 AM PDT  Subject: COVID booster and shingles vaccine    I had my first shingles shot on September 21. I'm scheduled for my COVID booster this afternoon. Do I have to wait a specific number of days between these vaccines, and if so how many? Thank you.

## 2020-03-07 NOTE — Progress Notes (Signed)
Patient WAS wearing a surgical mask  Contact and Droplet precautions were followed when caring for the patient.   PPE used by provider during encounter: Surgical mask, Face Shield/Goggles and Gloves    Influenza Vaccine Documentation:  The patient is receiving their Influenza Vaccine today: No    COVID-19 Vaccine Documentation:  The patient is eligible to receive the COVID-19 Vaccine at this time: Yes    The patient has received a COVID vaccine previously: Yes   Immunization History   Administered Date(s) Administered        COVID-19 mRNA PF 30 mcg/0.3 mL (Pfizer)  COVID-19 mRNA PF 30 mcg/0.3 mL [employer])   07/10/2019   07/31/2019       Is the patient receiving a 3rd dose today? Yes, Pfizer    Patient confirmed the following conditions indicating additional/booster dose eligibility: 38 years old or older    Confirmed the patient's 2nd dose was administered more than 6 months ago? Yes    The patient or person named in permissionacknowledges receipt of the Fact Sheet for Recipients and Caregivers, which includes information about the risks and benefits of the vaccine and available alternatives, for the following vaccine manufacturer: Pfizer-[employer]     The patient or person named in permission was informed the FDA has approved the [place] COVID-19 Vaccine as a 2-dose series for ages 51 and older. The FDA has authorized the emergency use of the Pfizer COVID-19 Vaccine as a 3rd dose booster for ages 54 and older, which is not an FDA-approved vaccine booster and that the patient may accept or refuse the vaccine.     Pre-Screening Documentation:        [facility] & Wynetta Emery) Vaccine Considerations  Is patient receiving the [facility] Wynetta Emery) COVID-19 Vaccine?: No      COVID-19 Immunization Absolute Contraindications  1. Have you ever had a severe allergic reaction to a previous dose of the COVID-19 Vaccine or to any ingredient of the COVID-19 Vaccine? (Review ingredients in the Fact Sheet for Recipients  and Caregivers): No  2. Have you ever had an immediate allergic reaction of any severity within 4 hours of receiving a previous dose of the COVID-19 Vaccine or any of its ingredients?: No  3. Have you ever had an immediate allergic reaction of any severity to polysorbate or polyethylene glycol [PEG]?: No  Did the patient answer yes to any of questions 1-3?: No (proceed to question 4)      COVID-19 Immunization Myocarditis and Pericarditis Consideration  4. Do you have a history of myocarditis or pericarditis or have you been told you have had inflammation of your heart or of the sac around your heart?: No      COVID-19 Immunization Multisystem Inflammatory Syndrome Consideration  5. Within the past 90 days have you been diagnosed with Multisystem Inflammatory Syndrome (MISC-C or MIS-A) after a COVID-19 infection?: No      COVID-19 Immunization Clinical Considerations  6. Are you pregnant or breastfeeding?: No  7. Do you have a weakened immune system caused by something such as HIV infection or cancer, or do you take medications that affect the immune system?: No  8. Do you have a bleeding disorder or are you taking blood thinners? : No  Did the patient answer yes to any of questions 6-8? : No (proceed to question 9)      COVID-19 Immunization Deferral Considerations  9. Have you had a confirmed COVID-19 exposure in the past 14 days?: No  10.  Have you tested positive for COVID-19 in the last 14 days? : No  11. Are you feeling sick today? : No  Did the patient answer yes to any of questions 9-11?: No (proceed to question 12)       COVID-19 Immunization Antibody Deferral Considerations  12. In the past 90 days, have you tested positive for COVID-19 and received treatment?: No (proceed to question 13)       COVID-19 Immunization Dermal Filler Consideration  13. Have you received a dermal filler injection?: No (proceed to question 14)      COVID-19 Immunization Observation Considerations  14. Do you have a history of  immediate allergic reaction of any severity to a vaccine?: No  15. Do you have a history of immediate allergic reaction of any severity to an injectable medication?: No  16. Do you have a history of severe allergic reaction due to any cause? : No  17. Have you ever fainted after receiving an injection?: No  Did the patient answer yes to any of questions 14-17? : No  If no, the patient was informed a 15-minute observation period is recommended after the vaccine to watch for a reaction.: Confirmed        All patient questions were answered and the patient agrees to receive the COVID-19 vaccine today.     Vaccine prepared and administered according to the current Emergency Use Authorization and Fact Sheet for Healthcare Providers Administering Vaccine.    Patient given vaccine card and discharge instructions with information about the Fact Sheet for Recipients and Caregivers and the v-safe program. Patient directed to observation area.     Theressa Stamps, RN

## 2020-03-13 ENCOUNTER — Encounter: Payer: Self-pay | Admitting: FAMILY PRACTICE

## 2020-03-13 NOTE — Telephone Encounter (Signed)
From: Kristopher Glee  To: Renato Battles, MD  Sent: 03/13/2020 9:37 AM PDT  Subject: Flu vaccine     When should we schedule our "over 65" flu vaccination? Thanks!

## 2020-03-15 ENCOUNTER — Ambulatory Visit: Payer: Medicare PPO | Admitting: Rehabilitative and Restorative Service Providers"

## 2020-03-30 ENCOUNTER — Ambulatory Visit: Payer: Medicare PPO

## 2020-03-30 DIAGNOSIS — Z23 Encounter for immunization: Secondary | ICD-10-CM

## 2020-03-30 NOTE — Nursing Note (Signed)
Immunization VIS documentation(s) were given to pt to review. All questions were answered and the patient consented to the Immunization(s) being given. Patient allergies were reviewed and no contraindications were found. The immunization(s) were given as ordered. The patient was observed for any immediate reactions to the vaccine. None were observed.

## 2020-04-08 ENCOUNTER — Encounter: Payer: Self-pay | Admitting: FAMILY PRACTICE

## 2020-04-09 MED ORDER — CLONAZEPAM 0.25 MG DISINTEGRATING TABLET: 0.2500 mg | DISINTEGRATING_TABLET | Freq: Two times a day (BID) | ORAL | 0 refills | Status: DC | PRN

## 2020-04-09 NOTE — Telephone Encounter (Signed)
Last Refill 02/21/20  LOV 02/21/20  NOV 05/22/20    Bess Harvest LVN

## 2020-05-22 ENCOUNTER — Encounter: Payer: Self-pay | Admitting: FAMILY PRACTICE

## 2020-05-22 ENCOUNTER — Ambulatory Visit: Payer: Medicare PPO | Admitting: FAMILY PRACTICE

## 2020-05-22 VITALS — BP 131/72 | HR 87 | Temp 98.1°F | Wt 150.8 lb

## 2020-05-22 DIAGNOSIS — H9193 Unspecified hearing loss, bilateral: Secondary | ICD-10-CM

## 2020-05-22 DIAGNOSIS — F419 Anxiety disorder, unspecified: Secondary | ICD-10-CM

## 2020-05-22 LAB — HEARING, SCREENING TEST
POC LT EAR 1 kHZ: 30
POC LT EAR 2 kHZ: 45
POC LT EAR 3 kHZ: 40
POC LT EAR 4 kHZ: 55
POC LT EAR 6 kHZ: 50
POC LT EAR 8 kHZ: 45
POC RT EAR 1 kHZ: 30
POC RT EAR 2 kHZ: 30
POC RT EAR 3 kHZ: 40
POC RT EAR 4 kHZ: 45
POC RT EAR 6 kHZ: 40
POC RT EAR 8 kHZ: 35

## 2020-05-22 NOTE — Progress Notes (Signed)
Date of Service: 05/22/2020; note started 8:17 AM    Patient WAS wearing a surgical mask  Standard and Droplet precautions were followed when caring for the patient.   PPE used by provider during encounter: Surgical mask and Face Shield/Goggles    Chief Complaint: Amanda Lester is a 64yr old female who comes in today for anxiety    Subjective:  Presents with anxiety, on long-term clonazepam, using approximately 30 tab/6 weeks. "Feel good for the most part." Frequently able to control anxiety by self-talk. Still seeing psychotherapy, exercising. Has approximately 8 tab remaining currently.    Also concerned about hearing, especially left side. Has to ask for repeats. Going on x approximately 1 yr. Uncertain if getting worse. No ear pain or discharge. Positive loud noise exposure, "I love rock and roll."     Review of Systems  See above-- integrated in Subjective section    Objective:  BP 131/72 (SITE: left arm, Orthostatic Position: sitting, Cuff Size: regular)   Pulse 87   Temp 36.7 C (98.1 F) (Temporal)   Wt 68.4 kg (150 lb 12.7 oz)   SpO2 99%   BMI 25.97 kg/m     PHQ-2 11/21/2019   Interest 0   Feeling 0   PHQ-2 Total Score 0       Physical Exam  Constitutional:       General: She is not in acute distress.     Appearance: She is well-developed.   HENT:      Head: Normocephalic and atraumatic.      Right Ear: Tympanic membrane, ear canal and external ear normal.      Left Ear: Tympanic membrane and external ear normal.      Ears:      Weber exam findings: does not lateralize.     Right Rinne: AC > BC.     Left Rinne: AC > BC.  Eyes:      General: No scleral icterus.  Cardiovascular:      Rate and Rhythm: Normal rate and regular rhythm.      Heart sounds: Normal heart sounds. No murmur heard.      Pulmonary:      Breath sounds: Normal breath sounds. No wheezing or rales.   Chest:      Chest wall: No tenderness.   Skin:     General: Skin is warm and dry.      Findings: No rash.       ASSESSMENT AND  PLAN:  (F41.9) Anxiety  (primary encounter diagnosis)  Comment:   Plan: Stable on current treatment regimen.  Continue.  Contact office if any problems. Follow up 6 mos. Discussed risk of benzos with increasing age, trial using 1/2 tab instead of a whole tab.     (H91.93) Decreased hearing of both ears  Comment: Consistent with conductive loss, likely age-related.   Plan: Hearing, Screening Test        Discussed option of hearing aids. At current level of dysfxn, it is likely that hearing aids will be more annoyance than help. Call for new appointment if symptoms worsen or fail to resolve in the expected timeframe, or if new and concerning symtoms arise.       Ongoing medications at end of visit:  ClonazePAM (KLONOPIN) 0.25 mg Disintegrating Tablet, Take 1 tablet by mouth two times daily if needed. Indications: anxiety (anxiety)  Omeprazole (PRILOSEC) 20 mg Delayed Release Capsule, Take 1 capsule by mouth once daily before a meal.  No current facility-administered medications for this visit.      I did review patient's past medical and family/social history, no changes noted.  Barriers to Learning assessed: none. Patient verbalizes understanding of teaching and instructions.    Renato Battles, MD  Attending Physician, Family Medicine  [employer] [employer]  Electronically signed 05/22/2020 9:08 AM

## 2020-05-22 NOTE — Nursing Note (Signed)
Patient verified using 2 identifiers.  Vitals taken, screened for allergies, pain and tobacco use.  Pharmacy verified.   Richerd Grime Kou Angelly Spearing, MA    Patient WAS wearing a surgical mask  Droplet precautions were followed when caring for the patient.   PPE used by provider during encounter: Surgical mask

## 2020-06-19 ENCOUNTER — Encounter: Payer: Self-pay | Admitting: FAMILY PRACTICE

## 2020-06-19 MED ORDER — CLONAZEPAM 0.25 MG DISINTEGRATING TABLET: 0.2500 mg | DISINTEGRATING_TABLET | Freq: Two times a day (BID) | ORAL | 0 refills | Status: DC | PRN

## 2020-06-19 NOTE — Telephone Encounter (Signed)
Last Refill 04/09/20  LOV 05/22/20  NOV 11/27/20    Bess Harvest LVN

## 2020-06-20 ENCOUNTER — Other Ambulatory Visit: Payer: Self-pay | Admitting: FAMILY PRACTICE

## 2020-06-20 NOTE — Telephone Encounter (Signed)
[employer] and spoke to [place], he stated they did received the prescription yesterday and will process the prescription order.    Called and spoke to pt (verified 3 identifiers), informed pt that [facility] did received the prescription and will process it. Pt stated she will stop by pharmacy tomorrow.    [facility] [place] [facility]

## 2020-08-15 ENCOUNTER — Encounter: Payer: Self-pay | Admitting: FAMILY PRACTICE

## 2020-08-15 MED ORDER — CLONAZEPAM 0.25 MG DISINTEGRATING TABLET: 0.2500 mg | DISINTEGRATING_TABLET | Freq: Two times a day (BID) | ORAL | 0 refills | Status: DC | PRN

## 2020-08-15 NOTE — Telephone Encounter (Signed)
Last refill on 06/19/2020.   Last OV on 05/22/2020. Future appt. on 11/27/2020.    [facility]

## 2020-09-06 ENCOUNTER — Encounter: Payer: Self-pay | Admitting: FAMILY PRACTICE

## 2020-09-06 NOTE — Telephone Encounter (Signed)
From: Kristopher Glee  To: Renato Battles, MD  Sent: 09/06/2020 4:13 PM PDT  Subject: Second Booster shot    Just curious, should we make an appointment to get our second COVID vaccine booster shot? Thanks much.

## 2020-10-02 ENCOUNTER — Telehealth: Payer: Self-pay | Admitting: FAMILY PRACTICE

## 2020-10-02 NOTE — Telephone Encounter (Signed)
Amanda Lester is a 65yr old female  3 patient identifiers used.  Per:   patient.    Disposition: CONSULT MD: Can patient take one of Dr. [place] same-day slots Thursday?   See Assessment Below  ClearTriage Note: Lower back/hip pain. Patient attributes to arthritis. Also has had minor rectal bleeding from history of internal hemmhoroids. Some blood drops. No available appointments.    Multiple (2) protocols were used on this call.  Disposition for Call: Consult MD    ----------------------    Protocol Used: Back Pain (Adult)  Protocol-Based Disposition: See PCP or Video Visit within 3 Days  Override (Final) Disposition: Consult MD  Override Reason: No appointments available    Video visit offer not recorded    Positive Triage Question:  * [1] MODERATE back pain (e.g., interferes with normal activities) AND [2] present > 3 days    Negative Triage Questions:  * Passed out (i.e., lost consciousness, collapsed and was not responding)  * Shock suspected (e.g., cold/pale/clammy skin, too weak to stand, low BP, rapid pulse)  * Sounds like a life-threatening emergency to the triager  * [1] SEVERE back pain (e.g., excruciating) AND [2] sudden onset AND [3] age > 28 years  * [1] Unable to urinate (or only a few drops) > 4 hours AND [2] bladder feels very full (e.g., palpable bladder or strong urge to urinate)  * [1] Loss of bladder or bowel control (urine or bowel incontinence; wetting self, leaking stool) AND [2] new-onset  * Numbness in groin or rectal area (i.e., loss of sensation)  * [1] SEVERE abdominal pain AND [2] present > 1 hour  * [1] Abdominal pain AND [2] age > 60 years  * Weakness of a leg or foot (e.g., unable to bear weight, dragging foot)  * Unable to walk  * Patient sounds very sick or weak to the triager  * [1] SEVERE back pain (e.g., excruciating, unable to do any normal activities) AND [2] not improved 2 hours after pain medicine  * [1] Pain radiates into the thigh or further down the leg AND [2] both  legs  * [1] Fever > 100.0 F (37.8 C) AND [2] flank pain (i.e., in side, below ribs and above hip)  * [1] Pain or burning with passing urine (urination) AND [2] flank pain (i.e., in side, below ribs and above hip)  * Numbness in a leg or foot (i.e., loss of sensation)  * [1] Numbness in an arm or hand (i.e., loss of sensation) AND [2] upper back pain  * High-risk adult (e.g., history of cancer, HIV, or IV drug use)  * Soft tissue infection (e.g., abscess, cellulitis) or other serious infection (e.g., bacteremia) in last 2 weeks  * [1] Fever AND [2] no symptoms of UTI  (Exception: has generalized muscle pains, not localized back pain)  * Rash in same area as pain (may be described as "small blisters")  * Blood in urine (red, pink, or tea-colored)    ----------------------    Protocol Used: Rectal Bleeding (Adult)  Protocol-Based Disposition: See PCP or Video Visit within 3 Days    Video visit offer not recorded    Positive Triage Question:  * MILD rectal bleeding (more than just a few drops or streaks)    Negative Triage Questions:  * Shock suspected (e.g., cold/pale/clammy skin, too weak to stand, low BP, rapid pulse)  * Difficult to awaken or acting confused (e.g., disoriented, slurred speech)  * Passed out (i.e.,  lost consciousness, collapsed and was not responding)  * [1] Vomiting AND [2] contains red blood or black ("coffee ground") material  (Exception: few red streaks in vomit that only happened once)  * Sounds like a life-threatening emergency to the triager  * SEVERE rectal bleeding (large blood clots; on and off, or constant bleeding)  * Severe dizziness (e.g., unable to stand, requires support to walk, feels like passing out now)  * [1] MODERATE rectal bleeding (small blood clots, passing blood without stool, or toilet water turns red) AND [2] more than once a day  * Pale skin (pallor) of new-onset or worsening  * Black or tarry bowel movements (Exception: chronic-unchanged black-grey bowel movements AND is  taking iron pills or Pepto-Bismol)  * [1] Constant abdominal pain AND [2] present > 2 hours  * Rectal foreign body (i.e., now or within past week;  inserted or swallowed)  * High-risk adult (e.g., prior surgery on aorta, abdominal aortic aneurysm)  * Taking Coumadin (warfarin) or other strong blood thinner, or known bleeding disorder (e.g., thrombocytopenia)  * Known cirrhosis of the liver (or history of liver failure or ascites)  * [1] Colonoscopy AND [2] in past 72 hours  * Patient sounds very sick or weak to the triager  * MODERATE rectal bleeding (small blood clots, passing blood without stool, or toilet water turns red)    Per:   patient verbalizes agreement to plan. Agrees to callback with any increase in symptoms/concerns or questions.    Etta Quill, RN  PCN Triage

## 2020-10-03 NOTE — Telephone Encounter (Signed)
I relayed MD message to patient. I scheduled patient for tomorrow at 1040 with Dr. Oren Binet.     Etta Quill, RN  PCN Triage

## 2020-10-03 NOTE — Telephone Encounter (Signed)
Margaretha Seeds, MD  Lenor Coffin, LVN 14 hours ago (5:28 PM)       Same Day/Urgent [facility]illiant

## 2020-10-04 ENCOUNTER — Ambulatory Visit: Payer: Medicare PPO | Attending: FAMILY PRACTICE | Admitting: FAMILY PRACTICE

## 2020-10-04 ENCOUNTER — Ambulatory Visit: Payer: Medicare PPO

## 2020-10-04 ENCOUNTER — Ambulatory Visit: Payer: Self-pay | Admitting: FAMILY PRACTICE

## 2020-10-04 ENCOUNTER — Encounter: Payer: Self-pay | Admitting: FAMILY PRACTICE

## 2020-10-04 VITALS — BP 130/81 | HR 95 | Temp 97.3°F | Wt 148.4 lb

## 2020-10-04 DIAGNOSIS — K921 Melena: Secondary | ICD-10-CM

## 2020-10-04 DIAGNOSIS — Z23 Encounter for immunization: Secondary | ICD-10-CM

## 2020-10-04 DIAGNOSIS — G8929 Other chronic pain: Secondary | ICD-10-CM

## 2020-10-04 DIAGNOSIS — Z1159 Encounter for screening for other viral diseases: Secondary | ICD-10-CM | POA: Insufficient documentation

## 2020-10-04 DIAGNOSIS — M545 Low back pain, unspecified: Secondary | ICD-10-CM

## 2020-10-04 LAB — COMPREHENSIVE METABOLIC PANEL
Alanine Transferase (ALT): 23 U/L (ref <=33)
Albumin: 4.3 g/dL (ref 4.0–4.9)
Alkaline Phosphatase (ALP): 96 U/L (ref 35–129)
Aspartate Transaminase (AST): 29 U/L (ref <=41)
Bilirubin Total: 0.3 mg/dL (ref <=1.2)
Calcium: 9.1 mg/dL (ref 8.6–10.0)
Carbon Dioxide Total: 26 mmol/L (ref 22–29)
Chloride: 101 mmol/L (ref 98–107)
Creatinine Serum: 0.72 mg/dL (ref 0.51–1.17)
E-GFR Creatinine (Female): 88 mL/min/{1.73_m2}
Glucose: 95 mg/dL (ref 74–109)
Potassium: 4 mmol/L (ref 3.4–5.1)
Protein: 7.3 g/dL (ref 6.6–8.7)
Sodium: 137 mmol/L (ref 136–145)
Urea Nitrogen, Blood (BUN): 9 mg/dL (ref 6–20)

## 2020-10-04 LAB — CBC NO DIFFERENTIAL
Hematocrit: 35.9 % — ABNORMAL LOW (ref 36.0–46.0)
Hemoglobin: 12.5 g/dL (ref 12.0–16.0)
MCH: 32 pg (ref 27.0–33.0)
MCHC: 34.9 % (ref 32.0–36.0)
MCV: 91.7 fL (ref 80.0–100.0)
MPV: 9.1 fL (ref 6.8–10.0)
Platelet Count: 216 10*3/uL (ref 130–400)
RDW: 12.7 % (ref 0.0–14.7)
Red Blood Cell Count: 3.92 10*6/uL — ABNORMAL LOW (ref 4.00–5.20)
White Blood Cell Count: 4.6 10*3/uL (ref 4.5–11.0)

## 2020-10-04 LAB — POC HEMOGLOBIN, WHOLE BLOOD: POC HEMOGLOBIN, WB: 12.4 g/dL (ref 12.0–16.0)

## 2020-10-04 LAB — C-REACTIVE PROTEIN: C-REACTIVE PROTEIN: 0.1 mg/dL (ref <0.5)

## 2020-10-04 LAB — HEPATITIS C AB SCREEN: Hepatitis C Ab Screen: NONREACTIVE

## 2020-10-04 LAB — SED RATE WESTERGREN: Sed Rate Westergren: 10 mm/hr (ref 0–30)

## 2020-10-04 MED ORDER — CLONAZEPAM 0.25 MG DISINTEGRATING TABLET: 0.2500 mg | DISINTEGRATING_TABLET | Freq: Two times a day (BID) | ORAL | 0 refills | Status: DC | PRN

## 2020-10-04 NOTE — Progress Notes (Signed)
Date of Service: 10/04/2020; note started 10:49 AM    Patient WAS wearing a surgical mask  Standard precautions were followed when caring for the patient.   PPE used by provider during encounter: Surgical mask    Chief Complaint: Amanda Lester is a 65yr old female who comes in today for back pain    Subjective:  Presents with chronic pain left low back, going on x couple of years. "Grabs" with bending, getting out of chair. Pain lasts 30 seconds at a time. No radiating. Occurs most days. Slightly worsening. No saddle numbness, no problems with BMs, urination. Has personal trainer.   Also with blood in stool. 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 Known internal hemorrhoids. Increased bleeding recently. Blood "attached to stool," morning only. Sometimes constipation, taking Benefiber. Bright red, scant. No pain. No abdominal pain, cramping. Possible mucus in stool.     Review of Systems  See above-- integrated in Subjective section    No Advance Directive / POLST on file or [place] / surrogate named. I provided counseling about Advance Care Planning.      Objective:  BP 130/81 (SITE: left arm, Orthostatic Position: sitting, Cuff Size: regular)   Pulse 95   Temp 36.3 C (97.3 F) (Temporal)   Wt 67.3 kg (148 lb 5.9 oz)   SpO2 99%   BMI 25.55 kg/m     PHQ-2 10/04/2020   Interest 0   Feeling 0   PHQ-2 Total Score 0       Physical Exam  Constitutional:       General: She is not in acute distress.     Appearance: She is well-developed.   Eyes:      General: No scleral icterus.  Abdominal:      General: Bowel sounds are normal. There is no distension.      Palpations: Abdomen is soft. There is no mass.      Tenderness: There is no abdominal tenderness. There is no rebound.   Musculoskeletal:      Lumbar back: No deformity or tenderness.   Skin:     General: Skin is warm and dry.      Findings: No rash.   Neurological:      Deep Tendon Reflexes: Reflexes  are normal and symmetric.      Comments: Negative straight leg raise bilateral       ASSESSMENT AND PLAN:  (M54.50,  G89.29) Chronic left-sided low back pain without sciatica  (primary encounter diagnosis)  Comment: Most consistent with age-related muscle spasm.   Plan: Physical Therapy Referral        physical therapy, yoga,    (K92.1) Blood in stool  Comment: Most consistent with hemorrhoidal bleeding, though differential diagnosis including mild IBD  Plan: CBC No Differential, Comprehensive Metabolic         Panel, Sed Rate Westergren, C-Reactive Protein        If inflammatory markers negative, then no additional work-up needed until scheduled colonoscopy next year.     (Z11.59) Need for hepatitis C screening test  Comment:   Plan: Hepatitis C Ab Screen              Ongoing medications at end of visit:  ClonazePAM (KLONOPIN) 0.25 mg Disintegrating Tablet, Take 1 tablet by mouth two times daily if needed. Indications: anxiety (anxiety)  Omeprazole (PRILOSEC) 20 mg Delayed Release Capsule, Take 1 capsule by mouth once daily before a meal.    No current  facility-administered medications for this visit.      I did review patient's past medical and family/social history, no changes noted.  Barriers to Learning assessed: none. Patient verbalizes understanding of teaching and instructions.    Renato Battles, MD  Attending Physician, Family Medicine  [facility]  Electronically signed 10/04/2020 3:59 PM    Total time I spent in care of this patient today (excluding time spent on other billable services) was 36 minutes.

## 2020-10-04 NOTE — Nursing Note (Signed)
Per Dr. Oren Binet, MD's order, performed POCT HBG test with patient's sample. Results entered into EMR and given to MD to review and advise. Thank you. Darnelle Maffucci, [place]

## 2020-10-04 NOTE — Nursing Note (Addendum)
PCV20 given today in the R deltoid per Leavy's order.  Patient given VIS what you need to know paperwork and also the information about vaccination while pregnant from the CDC.  Side effects discussed and all questions were answered.  Patient tolerated the injection well.     Pt aware of the following:  PCV20 may cause the following mild side effects, which usually last only a few days:   Body aches    Chills    Fever    Headache    Nausea, vomiting, diarrhea    Rash    Redness or swelling at the injection site    Soreness at the injection site     Patient WAS wearing a surgical mask  Droplet precautions were followed when caring for the patient.   PPE used by provider during encounter: Surgical mask and Gloves      Ronette Deter, Sr. LVN

## 2020-10-04 NOTE — Nursing Note (Signed)
Patient verified using 2 identifiers.  Vitals taken, screened for allergies, pain and tobacco use.  Pharmacy verified.  HM due: no ma action needed.  Ladean Steinmeyer, MA    Patient WAS wearing a surgical mask  Droplet precautions were followed when caring for the patient.   PPE used by Medical Assistant during encounter: Surgical mask. Barbara Ahart, MA

## 2020-10-04 NOTE — Patient Instructions (Addendum)
Glucosamine and chondroitin are herbal supplements that are available without a prescription.  They have been shown to significantly decrease pain for many people who have arthritis.  As with all treatments for arthritis, consistency of use is important. As with all herbal medications, the optimal dose is unknown, as are side effects and interactions with other medications, so use with caution.  If you develop any significant side effects to these medications you should make a return appointment.     An Advance Directive for Healthcare is a document that allows you to choose a specific person to make medical decisions for you in the event that you cannot make them yourself, such as if you were unconscious.  The simplest form of Advance Directive is a letter that states 'If I am unable to make my own medical decisions, I would like XXX to make them for me,' where 'XXX' is your chosen person. This letter must then be signed by two witnesses. No lawyer or notary public is required. You should discuss your preferences with your designated person (this is probably the most important step).  I recommend an Advance Directive for everyone over the age of 45 years, regardless of health. There are numerous resources on the Internet for more information about Advance Directives.  Two good places to start are [place] (www.cmanet.org) and [employer] for [place] (www.finalchoices.[facility].org).

## 2020-10-08 ENCOUNTER — Ambulatory Visit: Payer: Medicare PPO

## 2020-10-10 ENCOUNTER — Ambulatory Visit: Payer: Medicare PPO | Admitting: FAMILY PRACTICE

## 2020-11-07 ENCOUNTER — Ambulatory Visit: Payer: Medicare PPO

## 2020-11-27 ENCOUNTER — Ambulatory Visit: Payer: Medicare PPO | Admitting: FAMILY PRACTICE

## 2020-12-13 ENCOUNTER — Encounter: Payer: Self-pay | Admitting: FAMILY PRACTICE

## 2020-12-13 MED ORDER — CLONAZEPAM 0.25 MG DISINTEGRATING TABLET: 0.2500 mg | DISINTEGRATING_TABLET | Freq: Two times a day (BID) | ORAL | 0 refills | Status: DC | PRN

## 2020-12-13 NOTE — Telephone Encounter (Signed)
Last Refill 10/04/20  LOV 10/04/20  NOV 12/26/20    Bess Harvest LVN

## 2020-12-26 ENCOUNTER — Encounter: Payer: Self-pay | Admitting: FAMILY PRACTICE

## 2020-12-26 ENCOUNTER — Ambulatory Visit: Payer: Medicare PPO | Admitting: FAMILY PRACTICE

## 2020-12-26 VITALS — BP 124/70 | HR 88 | Temp 97.8°F | Resp 16 | Wt 142.6 lb

## 2020-12-26 DIAGNOSIS — K649 Unspecified hemorrhoids: Secondary | ICD-10-CM

## 2020-12-26 DIAGNOSIS — F419 Anxiety disorder, unspecified: Secondary | ICD-10-CM

## 2020-12-26 DIAGNOSIS — Z13820 Encounter for screening for osteoporosis: Secondary | ICD-10-CM

## 2020-12-26 DIAGNOSIS — K5730 Diverticulosis of large intestine without perforation or abscess without bleeding: Secondary | ICD-10-CM

## 2020-12-26 NOTE — Progress Notes (Signed)
Date of Service: 12/26/2020; note started 8:21 AM    Patient WAS wearing a surgical mask  Standard precautions were followed when caring for the patient.   PPE used by provider during encounter: Surgical mask    Chief Complaint: Amanda Lester is a 65yr old female who comes in today for anxiety    Subjective:  Presents with anxiety, doing "better." Less "angry." Avoids watching CNN, other world stress. Still anxious with traveling. Has taken 1 tab in the past 2 wk. Positive self-talk, exercise 5-6x/wk. Previously seeing psychotherapy, stopped due to cost.   Also with hemorrhoidal bleeding. Occurs with straining at stool. Lots of gas. No diarrhea.     Review of Systems  See above-- integrated in Subjective section    Objective:  Temp: 36.6 C (97.8 F) (07/20 0814)  Temp src: Temporal (07/20 0814)  Pulse: 88 (07/20 0814)  BP: 124/70 (07/20 0814)  Resp: 16 (07/20 0814)  SpO2: 100 % (07/20 0814)  Height: --  Weight: 64.7 kg (142 lb 10.2 oz) (07/20 0814)  Body mass index is 24.56 kg/m.    PHQ-2 10/04/2020   Interest 0   Feeling 0   PHQ-2 Total Score 0       Physical Exam  Constitutional:       General: She is not in acute distress.     Appearance: She is well-developed.   Eyes:      General: No scleral icterus.  Cardiovascular:      Rate and Rhythm: Normal rate and regular rhythm.      Heart sounds: Normal heart sounds. No murmur heard.  Pulmonary:      Breath sounds: Normal breath sounds. No wheezing or rales.   Chest:      Chest wall: No tenderness.   Abdominal:      General: Bowel sounds are normal. There is no distension.      Palpations: Abdomen is soft. There is no mass.      Tenderness: There is no abdominal tenderness. There is no rebound.   Skin:     General: Skin is warm and dry.      Findings: No rash.       ASSESSMENT AND PLAN:  (F41.9) Anxiety  (primary encounter diagnosis)  Comment: Now controlled   Plan: Follow up 6 mos, Patient instructed to follow up earlier if she has problems with medications, or if  new, unforseen, or worrisome problems develop.     (Z13.820) Screening for osteoporosis  Comment:   Plan: DEXA, COMPLETE            (K57.30) Diverticulosis of colon  Comment: without any indication of diverticulitis  Plan: Will follow for now. Call for new appointment if symptoms worsen or if new and concerning symtoms arise.     (K64.9) Bleeding hemorrhoid  Comment:   Plan: Will follow for now. Call for new appointment if symptoms worsen or if new and concerning symtoms arise.      Ongoing medications at end of visit:  ClonazePAM (KLONOPIN) 0.25 mg Disintegrating Tablet, Take 1 tablet by mouth two times daily if needed. Indications: anxiety (anxiety)  Omeprazole (PRILOSEC) 20 mg Delayed Release Capsule, Take 1 capsule by mouth once daily before a meal.    No current facility-administered medications for this visit.      I did review patient's past medical and family/social history, no changes noted.  Barriers to Learning assessed: none. Patient verbalizes understanding of teaching and instructions.    Alfonso Patten.  Oren Binet, MD  Attending Physician, Family Medicine  [facility] [employer] [employer]  Electronically signed 12/26/2020 1:21 PM

## 2020-12-26 NOTE — Patient Instructions (Signed)
Radiology Scheduling [phone]

## 2020-12-26 NOTE — Nursing Note (Signed)
ID verified by two sources: DOB and Name.  Vital signs taken, allergies verified, screened for pain, tobacco Hx verified. Pharmacy confirmed.      Patient WAS wearing a surgical mask  Standard, eye and mask precautions were followed when caring for the patient.   PPE used by provider during encounter: Surgical mask, face shield and Gloves      Mary Jane Pascual, Sr. LVN

## 2021-03-04 ENCOUNTER — Encounter: Payer: Self-pay | Admitting: FAMILY PRACTICE

## 2021-03-05 MED ORDER — CLONAZEPAM 0.25 MG DISINTEGRATING TABLET: 0.2500 mg | DISINTEGRATING_TABLET | Freq: Two times a day (BID) | ORAL | 0 refills | Status: DC | PRN

## 2021-03-05 NOTE — Telephone Encounter (Signed)
Last fill date 12/13/20  Last office visit 12/26/20

## 2021-03-27 ENCOUNTER — Ambulatory Visit: Payer: Medicare PPO

## 2021-04-04 ENCOUNTER — Ambulatory Visit: Payer: Medicare PPO

## 2021-04-04 DIAGNOSIS — Z23 Encounter for immunization: Secondary | ICD-10-CM

## 2021-04-04 NOTE — Nursing Note (Signed)
The patient has received a flu vaccine previously: yes    The Inactivated Influenza Vaccine VIS document for the Influenza Vaccine was given to patient to review. All questions were answered or referred to the physician.    The patient has no contraindications to receive the Influenza Vaccine.The Influenza Vaccine was administered per Policy 504.     The patient was observed for 15 minutes for immediate reactions to the vaccine per protocol. No reaction was observed.      Marcela Reyes-Padilla, MA

## 2021-05-22 ENCOUNTER — Encounter: Payer: Self-pay | Admitting: FAMILY PRACTICE

## 2021-05-23 MED ORDER — CLONAZEPAM 0.25 MG DISINTEGRATING TABLET: 0.2500 mg | DISINTEGRATING_TABLET | Freq: Two times a day (BID) | ORAL | 0 refills | Status: DC | PRN

## 2021-05-23 NOTE — Telephone Encounter (Signed)
Last fill date: 03/05/2021  Last office visit: 12/26/2020    Upcoming appointment: 06/28/2021

## 2021-06-28 ENCOUNTER — Encounter: Payer: Self-pay | Admitting: FAMILY PRACTICE

## 2021-06-28 ENCOUNTER — Ambulatory Visit: Payer: Medicare PPO | Admitting: FAMILY PRACTICE

## 2021-06-28 VITALS — BP 132/79 | HR 88 | Temp 97.8°F | Ht 64.0 in | Wt 138.9 lb

## 2021-06-28 DIAGNOSIS — M79645 Pain in left finger(s): Secondary | ICD-10-CM

## 2021-06-28 DIAGNOSIS — M25552 Pain in left hip: Secondary | ICD-10-CM

## 2021-06-28 DIAGNOSIS — F419 Anxiety disorder, unspecified: Secondary | ICD-10-CM

## 2021-06-28 DIAGNOSIS — M25559 Pain in unspecified hip: Secondary | ICD-10-CM | POA: Insufficient documentation

## 2021-06-28 DIAGNOSIS — E782 Mixed hyperlipidemia: Secondary | ICD-10-CM

## 2021-06-28 NOTE — Nursing Note (Signed)
Patient identified x 2, vitals taken, chief complaint identified, allergies updated and verified, assessed for pain, social history noted, pharmacy verified, med hx obtained.    Patient WAS wearing a surgical mask  Contact precautions were followed when caring for the patient.   PPE used by provider during encounter: Surgical mask      Jadyn Brasher Sally Kaidan Spengler, LVN

## 2021-06-28 NOTE — Progress Notes (Signed)
Date of Service: 06/28/2021; note started 8:17 AM    Patient WAS wearing a surgical mask  Standard precautions were followed when caring for the patient.   PPE used by provider during encounter: Surgical mask         Chief Complaint: Amanda Lester is a 66yr old female who comes in today for anxiety meds    Subjective:  Presents with episodic anxiety. Usually able to self-talk, meditate to control, sometimes when stressful events occur rapid heart, worry. Positive travel, going out with friends. Difficulty sleeping about once/wk-- waking frequently, tossing/turning. Otherwise not interfering with activities. Using clonazepam as needed, approximately 15 tab/month. No side effects, no drowsiness, memory lapses.   Also with intermittent pain left thumb. Pain in low back attributed to arthritis. Taking Tylenol approximately 2 tab/day. Thumb sometimes interfering with opening jars, etc. Pain in hip worse with getting out of chair, twisting. Pain low back not radiating down leg.   Also with "broken blood vessel" left cheek. Mildly puffy.     The patient /  surrogate was counseled about the importance of Advance Care Planning. In addition:  There are no ACP documents in / surrogate the EHR. The patient was given information about how to create and file ACP documents and Surrogate Decision Maker was confirmed and updated in ACP Activity       Fall risks Assessment:   No falls in past year. No throw rugs, poorly lighted areas, missing rails/grab bars in the home. No medications or conditions that affect stability or mobility.      Review of Systems  See above-- integrated in Subjective section    If significant depression was noted, please see assessment/plan section of note    Objective:  Vitals:    06/28/21 0817   BP: 132/79   SITE: left arm   Orthostatic Position: sitting   Cuff Size: regular   Pulse: 88   Temp: 36.6 C (97.8 F)   TempSrc: Temporal   SpO2: 99%   Weight: 63 kg (138 lb 14.2 oz)   Height: 1.626 m (5\' 4" )      Body mass index is 23.84 kg/m.    PHQ-2 06/28/2021   Interest 1   Feeling 0   PHQ-2 Total Score 1       Physical Exam  Constitutional:       General: She is not in acute distress.     Appearance: She is well-developed.   Eyes:      General: No scleral icterus.  Musculoskeletal:      Left hand: Normal. No tenderness. Normal range of motion.      Lumbar back: No tenderness. Negative right straight leg raise test and negative left straight leg raise test.      Right hip: No deformity or bony tenderness. Decreased range of motion.      Left hip: No deformity or bony tenderness. Decreased range of motion.   Skin:     General: Skin is warm and dry.      Findings: No rash.   Psychiatric:         Attention and Perception: Attention normal.         Mood and Affect: Mood and affect normal.         Speech: Speech normal.         Behavior: Behavior normal.         Thought Content: Thought content normal. Thought content does not include homicidal or suicidal plan.  Cognition and Memory: Memory is not impaired (Insight normal).         Judgment: Judgment normal.     ASSESSMENT AND PLAN:  (F41.9) Anxiety  (primary encounter diagnosis)  Comment:   Plan: Stable on current treatment regimen.  Continue.  Contact office if any problems. Discussed risks of memory side effects of clonazepam with increasing age.     (E78.2) Mixed hyperlipidemia  Comment:   Plan: Lipid Panel with DLDL Reflex, Comprehensive         Metabolic Panel            (M79.645) Pain of left thumb  Comment: Most likely [facility] OA  Plan: FINGER, LEFT, HAND THERAPY REFERRAL            (M25.552) Left hip pain  Comment: Most consistent with OA  Plan: HIP 2+ VIEWS, LEFT + AP PELVIS, Physical         Therapy Referral              Ongoing medications at end of visit:  ClonazePAM (KLONOPIN) 0.25 mg Disintegrating Tablet, Take 1 tablet by mouth two times daily if needed. Indications: anxiety (anxiety)  Omeprazole (PRILOSEC) 20 mg Delayed Release Capsule, Take 1 capsule  by mouth once daily before a meal.    No current facility-administered medications for this visit.      I did review patient's past medical and family/social history, no changes noted.  Barriers to Learning assessed: none. Patient verbalizes understanding of teaching and instructions.    Renato Battles, MD  Attending Physician, Family Medicine  [facility] [employer]  Electronically signed 06/28/2021 1:09 PM    Total time I spent in care of this patient today (excluding time spent on other billable services) was 35 minutes. This time does not overlap with other providers involved in this patient's care.

## 2021-07-11 ENCOUNTER — Ambulatory Visit
Admission: RE | Admit: 2021-07-11 | Discharge: 2021-07-11 | Disposition: A | Discharge status: Home / Self Care | Payer: Medicare PPO | Source: Ambulatory Visit | Attending: FAMILY PRACTICE | Admitting: FAMILY PRACTICE

## 2021-07-11 ENCOUNTER — Ambulatory Visit: Payer: Medicare PPO

## 2021-07-11 DIAGNOSIS — E782 Mixed hyperlipidemia: Secondary | ICD-10-CM | POA: Insufficient documentation

## 2021-07-11 DIAGNOSIS — M1612 Unilateral primary osteoarthritis, left hip: Secondary | ICD-10-CM

## 2021-07-11 DIAGNOSIS — M25552 Pain in left hip: Secondary | ICD-10-CM | POA: Insufficient documentation

## 2021-07-11 DIAGNOSIS — M19042 Primary osteoarthritis, left hand: Secondary | ICD-10-CM

## 2021-07-11 DIAGNOSIS — M79645 Pain in left finger(s): Secondary | ICD-10-CM | POA: Insufficient documentation

## 2021-07-11 LAB — COMPREHENSIVE METABOLIC PANEL
Alanine Transferase (ALT): 20 U/L (ref <=33)
Albumin: 4.1 g/dL (ref 4.0–4.9)
Alkaline Phosphatase (ALP): 94 U/L (ref 35–129)
Anion Gap: 11 mmol/L (ref 7–15)
Aspartate Transaminase (AST): 18 U/L (ref <=41)
Bilirubin Total: 0.3 mg/dL (ref <=1.2)
Calcium: 9.5 mg/dL (ref 8.6–10.0)
Carbon Dioxide Total: 27 mmol/L (ref 22–29)
Chloride: 103 mmol/L (ref 98–107)
Creatinine Serum: 0.84 mg/dL (ref 0.51–1.17)
E-GFR Creatinine (Female): 76 mL/min/{1.73_m2}
Glucose: 101 mg/dL (ref 74–109)
Potassium: 4 mmol/L (ref 3.4–5.1)
Protein: 7.3 g/dL (ref 6.6–8.7)
Sodium: 141 mmol/L (ref 136–145)
Urea Nitrogen, Blood (BUN): 11 mg/dL (ref 6–20)

## 2021-07-11 LAB — LIPID PANEL WITH DLDL REFLEX
Cholesterol: 216 mg/dL — ABNORMAL HIGH (ref <200)
HDL Cholesterol: 63 mg/dL (ref >40)
LDL Cholesterol Calculation: 133 mg/dL — ABNORMAL HIGH (ref <100)
Non-HDL Cholesterol: 153 mg/dL — ABNORMAL HIGH (ref <=150)
Total Cholesterol: HDL Ratio: 3.4 (ref <4.0)
Triglyceride: 99 mg/dL (ref <150)

## 2021-07-29 ENCOUNTER — Encounter: Payer: Self-pay | Admitting: FAMILY PRACTICE

## 2021-07-30 MED ORDER — CLONAZEPAM 0.25 MG DISINTEGRATING TABLET: 0.2500 mg | DISINTEGRATING_TABLET | Freq: Two times a day (BID) | ORAL | 0 refills | Status: DC | PRN

## 2021-07-30 NOTE — Telephone Encounter (Signed)
Requested Prescriptions     Pending Prescriptions Disp Refills    ClonazePAM (KLONOPIN) 0.25 mg Disintegrating Tablet 30 tablet 0     Sig: Take 1 tablet by mouth two times daily if needed. Indications: anxiety (anxiety)        Last Refill: 05/23/21  LOV: 06/28/2021   NOV: 12/26/2021     Marcela Reyes-Padilla MA

## 2021-08-19 ENCOUNTER — Ambulatory Visit: Payer: Medicare PPO

## 2021-08-19 ENCOUNTER — Ambulatory Visit: Payer: Medicare PPO | Admitting: Rehabilitative and Restorative Service Providers"

## 2021-08-21 ENCOUNTER — Ambulatory Visit: Payer: Medicare PPO

## 2021-09-17 ENCOUNTER — Ambulatory Visit: Payer: Medicare PPO | Admitting: Rehabilitative and Restorative Service Providers"

## 2021-09-17 DIAGNOSIS — M25552 Pain in left hip: Secondary | ICD-10-CM

## 2021-09-18 NOTE — Progress Notes (Signed)
PHYSICAL THERAPY EVALUATION 09/17/2021     Diagnosis:  Therapy diagnosis: Left hip pain [M25.552]   Medical diagnosis: Left hip pain [M25.552]     Authorizing MD (First and last name) and PI#: Margaretha Seeds, MD   Onset Date: 06/28/2021     Start of Care Date: 09/17/2021   Prior Level of function: was able to get out of chair, turn and exercise without pain  Prior treatment for this diagnosis: no  Rehab potential: Good    Precautions: none    Certification period end date (for Medicare patients): 12/17/2021  Estimated Discharge Date: 12/17/21    Plan of care:  Established 09/17/2021   1 times every 1-2 week for a total of 6-12 treatments.  To include   Therapeutic Procedure  Neuromuscular Re-education  Therapeutic Activity  Traction  Deep Soft Tissue Massage  Joint Mobilization  Gait Training  Modalities: hot packs and cold packs  Home Exercise Program  Patient/Caregiver Education    Goals:   Patient will be able to sit 2 hours without increased pain to watch move with family  Patient will be able to turn to left without hip pain  Patient will be able to get in/out of car and drive without hip pain  Patient will be able to single leg stand for 10 seconds for dressing  Patient will be independent with home exercise program and home recommendations in order to continually progress function    Fall Risk Assessment:   No falls in past year. No throw rugs, poorly lighted areas, missing rails/grab bars in the home.   No medications or conditions that affect stability or mobility.    History of falls: mo.   Uses walker/cane etc: no.   Has potential safety issues at home: no.   Medication or condition that may affect safety: no.     Total Evaluation time: 30   Treatment time in addition to evaluation: 15    Pre pain: 2/10  Post pain:  NR/10    Clinical Evaluation     SUBJECTIVE  History of current problem:                          Amanda Lester is a 66yrold female who presents with a history of left hip pain for  about 1 year, insidious onset. Pain can be in the joint, but also wrap around from the lateral hip along upper buttock.   Also has lumbar spine pain that started around the same time, aching in the lower left side.   Gets pain with pivoting to left on fixed feet, but not with golfing when she pivots right foot     Aggravating factors include: prolonged sitting, pivoting to left, getting in/out of chair  Easing Factors include: changing position    Pain ranges from   0 /10 at best   6/10 at worst     Limitations in activities of daily living include:   Sitting, pivoting,     Medical history:   Electronic Medical Record Reviewed: Including radiologist reports of associated images and scans   FINDINGS:  There is no acute fracture or dislocation. Moderate generalized osteopenia.  Mild degenerative changes of bilateral hips with tiny acetabular roof and  inferomedial femoral head osteophytes.  Mild degenerative changes of inferior SI joints. Mild lower lumbar  degenerative disease.        IMPRESSION:  1. Mild left hip osteoarthritis.  Preliminary Report Electronically Signed By: Charlsie Merles on 07/11/2021 11:08 AM     I have personally reviewed the images of this study and agree with the  above report.     Final Report Electronically Signed By: Marquis Lunch on 07/11/2021 11:30 AM    No past medical history on file.    Past Surgical History:   Procedure Laterality Date    PR CESAREAN DELIVERY ONLY  1974    C-section, low cervical    PR COLONOSCOPY FLX W/ENDOSCOPIC MUCOSAL RESECTION  01/21/2019         PR COLONOSCOPY FLX W/ENDOSCOPIC MUCOSAL RESECTION  01/24/2019                Social History:  Retired, exercises several times each week, likes to golf, be active  Social History     Social History Narrative    Not on file         OBJECTIVE EXAM:  Appearance:   healthy, alert, and cooperative  Hip slightly shifted to the left     Inspection:  :Muscle bulk symmetrical bilaterally at lower limbs, no deformity, atrophy, or  skin changes    Palpation:   Hip tender to palpation over glute medius, piriformis       Gait:Assistive device used: no assistive device     Gait assessment (description of gait pattern): slight hip drop/shift to left with left lower extremity weight bearing     Range of motion (in degrees)   Active  Passive  Normal     Left Right Left Right    Flexion     125   Extension      30   Adduction      30   Abduction     30-50   Internal rotation     40   External rotation     60     Other range of motion tested: lumbar spine WNL all pivots      Strength:  MMT out of 5   Left Right   Flexion 5 5   Extension 5 5   Adduction  5 5   Abduction 4 4   Internal rotation 4 4   External rotation  4 4         Special Tests:    Left  Right  Reason for test Position of test   Yeoman?s Test: negative negative Provocation test for SI joint dysfunction.    Prone: stabilize contralateral hip and pull upward on anterior thigh   Patrick?s Test  (FABER) negative negative Provocation test for ipsilateral hip dysfunction, or contralateral SI joint dysfunction   Supine: Stabilize contralateral pelvis.  Flex, abduct and ER hip. (figure 4 position)     Femoral- acetabular impingement Test (FADIR) Not tested Not tested Provocation for hip impingement (groin pain)     Supine: Hip flexed and adducted across midline with Internal Rotation           Treatment today in addition to evaluation(if applicable):    Therapeutic Procedure 15 minutes -   Supine  Knee/hip sways  Hip internal rotation and external rotation sways  Seated piriformis stretches  Side plank lifts to left  Sustained bridge with hip abduction pulls    Issued patient personalized HEP2GO handout with illustrations and instructions for intensity, frequency and duration for home exercise program. Personal program can be accessed online by entering code [email or web address] at [email or web address]        Patient was educated  in the anatomy and physiology involved.  Common symptoms, treatments,  responses to treatment, and self management were discussed with the patient.         ASSESSMENT: The patient presents with left hip pain that is limiting function.  The signs and symptoms are consistent with DJD and hip and core weakness and patient requires skilled physical therapy in order to achieve functional goals and provide education in order to improve ability to sit, transfers and exercise      Clinical Presentation: Stable/Uncomplexed     Plan for next visit: progressive strengthening  (see plan of care at top of note for general plan details)    In the event of unattended discharge, this note will serve as discharge document      Patient Education:      Method of Teaching: demonstration, verbal and written hand-out     Learner: patient     Response: verbalizes understanding and able to give return demonstration       Has the plan of care been explained to the patient/caregiver? yes       Is the patient able to understand the plan of care: yes       Does the patient/caregiver(s) agree with the plan of care: yes       Are there barriers to learning? no.         Motivated to learn? yes       Best learning method: verbal, or demo      Patient/Family participation and agreement with recommendations: verbalized agreement    Is patient currently receiving outside care for this problem:     Home Health care services: no  Skilled nursing facility: no    Other no    The components of this evaluation necessitated a Low complexity level of clinical decision making.    M. Rose Fillers, MSPT, CCS Paneled  PI# [id number]  Pager- [phone]  Phone: [phone]

## 2021-09-25 ENCOUNTER — Encounter: Payer: Self-pay | Admitting: FAMILY PRACTICE

## 2021-09-25 MED ORDER — CLONAZEPAM 0.25 MG DISINTEGRATING TABLET: 0.2500 mg | DISINTEGRATING_TABLET | Freq: Two times a day (BID) | ORAL | 0 refills | Status: DC | PRN

## 2021-09-25 NOTE — Telephone Encounter (Signed)
Last Refill: 07/30/2021    Recent Visits  Date Type Provider Dept   06/28/21 Office Visit Margaretha Seeds, MD [facility]   12/26/20 Office Visit Margaretha Seeds, MD [facility]   10/04/20 Office Visit Margaretha Seeds, MD [facility]   05/22/20 Office Visit Margaretha Seeds, MD [facility] recent visits within past 540 days with a meds authorizing provider and meeting all other requirements  Future Appointments  Date Type Provider Dept   12/26/21 Appointment Margaretha Seeds, MD [facility]   Showing future appointments within next 150 days with a meds authorizing provider and meeting all other requirements

## 2021-10-07 ENCOUNTER — Encounter: Payer: Self-pay | Admitting: FAMILY PRACTICE

## 2021-10-07 NOTE — Telephone Encounter (Signed)
From: Kristopher Glee  To: Renato Battles, MD  Sent: 10/05/2021 4:23 PM PDT  Subject: Additional Covid vaccination     I understand that CDC is recommending another Covid vaccination for people over 65. Is this true and is the medical center offering it now?

## 2021-10-07 NOTE — Telephone Encounter (Signed)
Advised covid booster's currently on hold

## 2021-10-07 NOTE — Progress Notes (Signed)
Physical Therapy Treatment Note: 10/08/2021       Diagnosis:  Therapy diagnosis: Left hip pain [M25.552]   Medical diagnosis: Left hip pain [M25.552]      Authorizing MD (First and last name) and PI#: Margaretha Seeds, MD   Onset Date: 06/28/2021     Start of Care Date: 09/17/2021   Prior Level of function: was able to get out of chair, turn and exercise without pain  Prior treatment for this diagnosis: no  Rehab potential: Good     Precautions: none     Certification period end date (for Medicare patients): 12/17/2021  Estimated Discharge Date: 12/17/21     Plan of care:  Established 09/17/2021              1 times every 1-2 week for a total of 6-12 treatments.  To include   Therapeutic Procedure  Neuromuscular Re-education  Therapeutic Activity  Traction  Deep Soft Tissue Massage  Joint Mobilization  Gait Training  Modalities: hot packs and cold packs  Home Exercise Program  Patient/Caregiver Education     Goals:   Patient will be able to sit 2 hours without increased pain to watch move with family  Patient will be able to turn to left without hip pain  Patient will be able to get in/out of car and drive without hip pain  Patient will be able to single leg stand for 10 seconds for dressing  Patient will be independent with home exercise program and home recommendations in order to continually progress function     Fall Risk Assessment:   No falls in past year. No throw rugs, poorly lighted areas, missing rails/grab bars in the home.   No medications or conditions that affect stability or mobility.    History of falls: mo.   Uses walker/cane etc: no.   Has potential safety issues at home: no.   Medication or condition that may affect safety: no.        Total # of visits: 2    Total Treatment Time: 45 minutes    Pre pain: 0/10  Post pain:       0/10    Subjective: currently does not have pain but when she does iti s in groin and latera L hip/low back.   Played golf yesterday and     Objective: Patient seen for  the following treatment:   Therapeutic Procedure 15 minutes - Cardiovascular exercise as follows: Recumbent stepper  x 5 minutes for warm up and blood flow   Hamstring stretching L>R      [PLQ2YAY]    Anterior Isometrics - Middle -  Repeat 30 Times, Hold 3 Seconds, Complete 1 Set, Perform 1 Times an Hour    Abdominal Isometrics with March -  Repeat 10 Times, Hold 3 Seconds, Complete 2 Sets, Perform 1 Times a Day    Oblique Abdominal Isometric-Left -  Repeat 10 Times, Hold 3 Seconds,    SELF MASSAGE BALL - GLUTE - WALL -  Duration 30 Seconds, Complete 2 Sets, Perform 1 Times a Day    SELF MASSAGE BALL - PIRIFORMIS - WALL -  Duration 30 Seconds, Complete 2 Sets, Perform 1 Times a Day    ROCKING PIRIFORMIS STRETCH -  Repeat 5 Times, Hold 5 Seconds, Complete 1 Set, Perform 1 Times a Day  Patient was given verbal and tactile cues for proper form for all exercises listed above. After instruction, patient demonstrated correct technique with exercises and is able  to perform them at home. Printed handouts with instructions were given for use with Home Exercise Program.     Deep Soft Tissue Massage 10 minutes  STM L>R QL/iliolumbar ligament   STM L glute med/piriformis     Joint Mobilization 10 minutes  L hip lateral/inferior mob   Sacral gllide inferior       Modalities - Cold packs 10 minutes applied to low back,L hip     Assessment: Decreased soft tissue restriction in L glute med/minimum assist  post manual therapy.    patient will benefit from further skilled physical therapy to increase leg strength and core stabilization to increase functional activities.     Plan: continue physical therapy - as per plan of care. Thoracolumbar junction mobs, glute med STM, core/hip strengthening     Any changes to plan of care, or goals: no   If so then plan of care will be sent to MD for approval.       Amanda Lester, [employer]   Amanda Lester, PTA III  PI# [phone]

## 2021-10-08 ENCOUNTER — Ambulatory Visit: Payer: Medicare PPO | Admitting: Rehabilitative and Restorative Service Providers"

## 2021-10-08 DIAGNOSIS — M25552 Pain in left hip: Secondary | ICD-10-CM

## 2021-10-11 NOTE — Progress Notes (Signed)
I reviewed the Physical Therapy Assistant's progress note and agree with the PTA's treatment and plan that was developed.      In the event of unattended discharge, this note will serve as discharge document.    M. Casey Eian Vandervelden, PT  PI# [id number]  Pager- [phone]

## 2021-10-22 ENCOUNTER — Ambulatory Visit: Payer: Medicare PPO | Admitting: Rehabilitative and Restorative Service Providers"

## 2021-10-22 DIAGNOSIS — M25552 Pain in left hip: Secondary | ICD-10-CM

## 2021-10-22 NOTE — Progress Notes (Signed)
Physical Therapy Treatment Note: 10/08/2021       Diagnosis:  Therapy diagnosis: Left hip pain [M25.552]   Medical diagnosis: Left hip pain [M25.552]      Authorizing MD (First and last name) and PI#: Margaretha Seeds, MD   Onset Date: 06/28/2021     Start of Care Date: 09/17/2021   Prior Level of function: was able to get out of chair, turn and exercise without pain  Prior treatment for this diagnosis: no  Rehab potential: Good     Precautions: none     Certification period end date (for Medicare patients): 12/17/2021  Estimated Discharge Date: 12/17/21     Plan of care:  Established 09/17/2021              1 times every 1-2 week for a total of 6-12 treatments.  To include   Therapeutic Procedure  Neuromuscular Re-education  Therapeutic Activity  Traction  Deep Soft Tissue Massage  Joint Mobilization  Gait Training  Modalities: hot packs and cold packs  Home Exercise Program  Patient/Caregiver Education     Goals:   Patient will be able to sit 2 hours without increased pain to watch move with family  Patient will be able to turn to left without hip pain  Patient will be able to get in/out of car and drive without hip pain  Patient will be able to single leg stand for 10 seconds for dressing  Patient will be independent with home exercise program and home recommendations in order to continually progress function     Fall Risk Assessment:   No falls in past year. No throw rugs, poorly lighted areas, missing rails/grab bars in the home.   No medications or conditions that affect stability or mobility.    History of falls: mo.   Uses walker/cane etc: no.   Has potential safety issues at home: no.   Medication or condition that may affect safety: no.        Total # of visits: 3    Total Treatment Time: 40 minutes    Pre pain: 0/10  Post pain:       0/10    Subjective: Patient reported feeling pain when getting out of the car in the parking lot today and standing up, specifically stating whenever she "pivots left" is  when she feels pain. Patient reports she has not completed last week's HEP due to not having an exercise ball at home.     Objective: Patient seen for the following treatment:   Therapeutic exercise 15 minutes - Recumbent stepper  x 5 minutes for warm up and blood flow   Hamstring stretching L>R     Single knee to chest stretch for quad, glute, and hip flexor stretching 2 x 30 sec each side  Double knee to chest stretch for low back 2 x 30 sec  Figure 4 stretch for piriformis 2 x 30 sec   Seated hamstring stretch 2 x 30 sec   Bridges 1 x 10 for core and glute warm up   Bridges with knee extension and hip abduction 2 x 10   Supine adductor squeezes with ball between knees 2 x 10 with 3 sec hold while maintaining abdominal contraction   Supine bent knee hip abduction with blue TB around knees while maintaining abdominal contraction  Palloff press with yellow TB 2 x 10 for isometric abdominal strengthening      Patient was given verbal and tactile cues for proper form  for all exercises listed above. After instruction, patient demonstrated correct technique with exercises and is able to perform them at home. Printed handouts with instructions were given for use with Home Exercise Program.      UUDDARG    Assessment: Patient progressed in abdominal strengthening exercises today with simultaneous abdominal activation with lower extremity movement. Patient tolerated stretches and exercises well and denied pain this session. Patient able to tolerate addition of standing palloff press to assist with progressing rotational abdominal strength. Patient will benefit from further skilled physical therapy to increase leg strength and core stabilization to increase functional activities.     Plan: continue physical therapy - as per plan of care. Thoracolumbar junction mobs, glute med STM, core/hip strengthening     Any changes to plan of care, or goals: no   If so then plan of care will be sent to MD for approval.       Rhys Martini, PT Student

## 2021-10-22 NOTE — Progress Notes (Signed)
Physical Therapy Treatment Note:       Diagnosis:  Therapy diagnosis: Left hip pain [M25.552]   Medical diagnosis: Left hip pain [M25.552]      Authorizing MD (First and last name) and PI#: Margaretha Seeds, MD   Onset Date: 06/28/2021     Start of Care Date: 09/17/2021   Prior Level of function: was able to get out of chair, turn and exercise without pain  Prior treatment for this diagnosis: no  Rehab potential: Good     Precautions: none     Certification period end date (for Medicare patients): 12/17/2021  Estimated Discharge Date: 12/17/21     Plan of care:  Established 09/17/2021              1 times every 1-2 week for a total of 6-12 treatments.  To include   Therapeutic Procedure  Neuromuscular Re-education  Therapeutic Activity  Traction  Deep Soft Tissue Massage  Joint Mobilization  Gait Training  Modalities: hot packs and cold packs  Home Exercise Program  Patient/Caregiver Education     Goals:   Patient will be able to sit 2 hours without increased pain to watch move with family  Patient will be able to turn to left without hip pain  Patient will be able to get in/out of car and drive without hip pain  Patient will be able to single leg stand for 10 seconds for dressing  Patient will be independent with home exercise program and home recommendations in order to continually progress function     Fall Risk Assessment:   No falls in past year. No throw rugs, poorly lighted areas, missing rails/grab bars in the home.   No medications or conditions that affect stability or mobility.    History of falls: mo.   Uses walker/cane etc: no.   Has potential safety issues at home: no.   Medication or condition that may affect safety: no.        Total # of visits: 2    Total Treatment Time: 45 minutes    Pre pain: 0/10  Post pain:       0/10    Subjective: currently does not have pain but when she does iti s in groin and latera L hip/low back.   Played golf yesterday and     Objective: Patient seen for the  following treatment:   Therapeutic Procedure 15 minutes - Cardiovascular exercise as follows: Recumbent stepper  x 5 minutes for warm up and blood flow   Hamstring stretching L>R      [PLQ2YAY]    Anterior Isometrics - Middle -  Repeat 30 Times, Hold 3 Seconds, Complete 1 Set, Perform 1 Times an Hour    Abdominal Isometrics with March -  Repeat 10 Times, Hold 3 Seconds, Complete 2 Sets, Perform 1 Times a Day    Oblique Abdominal Isometric-Left -  Repeat 10 Times, Hold 3 Seconds,    SELF MASSAGE BALL - GLUTE - WALL -  Duration 30 Seconds, Complete 2 Sets, Perform 1 Times a Day    SELF MASSAGE BALL - PIRIFORMIS - WALL -  Duration 30 Seconds, Complete 2 Sets, Perform 1 Times a Day    ROCKING PIRIFORMIS STRETCH -  Repeat 5 Times, Hold 5 Seconds, Complete 1 Set, Perform 1 Times a Day  Patient was given verbal and tactile cues for proper form for all exercises listed above. After instruction, patient demonstrated correct technique with exercises and is able to  perform them at home. Printed handouts with instructions were given for use with Home Exercise Program.     Deep Soft Tissue Massage 10 minutes  STM L>R QL/iliolumbar ligament   STM L glute med/piriformis     Joint Mobilization 10 minutes  L hip lateral/inferior mob   Sacral gllide inferior       Modalities - Cold packs 10 minutes applied to low back,L hip     Assessment: Decreased soft tissue restriction in L glute med/minimum assist  post manual therapy.    patient will benefit from further skilled physical therapy to increase leg strength and core stabilization to increase functional activities.     Plan: continue physical therapy - as per plan of care. Thoracolumbar junction mobs, glute med STM, core/hip strengthening     Any changes to plan of care, or goals: no   If so then plan of care will be sent to MD for approval.       Amanda Lester, [place]   Amanda Lester, PTA III  PI# [phone]

## 2021-10-31 ENCOUNTER — Ambulatory Visit: Payer: Medicare PPO | Admitting: Rehabilitative and Restorative Service Providers"

## 2021-10-31 DIAGNOSIS — M25552 Pain in left hip: Secondary | ICD-10-CM

## 2021-10-31 NOTE — Progress Notes (Signed)
Physical Therapy Treatment Note: 10/31/2021       Diagnosis:  Therapy diagnosis: Left hip pain [M25.552]   Medical diagnosis: Left hip pain [M25.552]      Authorizing MD (First and last name) and PI#: Margaretha Seeds, MD   Onset Date: 06/28/2021     Start of Care Date: 09/17/2021   Prior Level of function: was able to get out of chair, turn and exercise without pain  Prior treatment for this diagnosis: no  Rehab potential: Good     Precautions: none     Certification period end date (for Medicare patients): 12/17/2021  Estimated Discharge Date: 12/17/21     Plan of care:  Established 09/17/2021              1 times every 1-2 week for a total of 6-12 treatments.  To include   Therapeutic Procedure  Neuromuscular Re-education  Therapeutic Activity  Traction  Deep Soft Tissue Massage  Joint Mobilization  Gait Training  Modalities: hot packs and cold packs  Home Exercise Program  Patient/Caregiver Education     Goals:   Patient will be able to sit 2 hours without increased pain to watch move with family  not met/progressing  Patient will be able to turn to left without hip pain  not met/progressing  Patient will be able to get in/out of car and drive without hip pain not met/progressing  Patient will be able to single leg stand for 10 seconds for dressing  not met/progressing  Patient will be independent with home exercise program and home recommendations in order to continually progress function  not met/progressing     Fall Risk Assessment:   No falls in past year. No throw rugs, poorly lighted areas, missing rails/grab bars in the home.   No medications or conditions that affect stability or mobility.    History of falls: mo.   Uses walker/cane etc: no.   Has potential safety issues at home: no.   Medication or condition that may affect safety: no.         Total # of visits: 4    Total Treatment Time: 40 minutes    Pre pain: 0-1/10  Post pain:       same/10    Subjective: patient would like review of HEP.  Has  been also seeing personal trainer ~ 2x/week.  Not much discomfort today.     Objective: Patient seen for the following treatment:   Therapeutic Procedure 15 minutes - Cardiovascular exercise as follows: Recumbent stepper  x 5 minutes for warm up and blood flow   Review of HEP     HOME EXERCISE PROGRAM [[id number]]    HIP FLEXOR STRETCH -  Repeat 3 Times, Hold 30 Seconds, Complete 1 Set, Perform 3 Times a Day    STANDING HIP FLEXOR STRETCH - KNEE ON CHAIR -  Repeat 3 Times, Hold 30 Seconds, Complete 1 Set, Perform 3 Times a Day    HIP FLEXOR STRETCH - FOOT ON CHAIR -  Repeat 3 Times, Hold 30 Seconds, Complete 1 Set, Perform 3 Times a Day    STANDING HAMSTRING STRETCH - PROPPED -  Repeat 3 Times, Hold 30 Seconds, Complete 1 Set, Perform 3 Times a Day    CALF STRETCH TOWEL -  Repeat 3 Times, Hold 30 Seconds, Complete 1 Set, Perform 3 Times a Day    ROCKING PIRIFORMIS STRETCH -  Repeat 3 Times, Hold 30 Seconds, Complete 1 Set, Perform 3 Times a  Day    Open book -  Repeat 3 Times, Hold 30 Seconds, Complete 1 Set, Perform 3 Times a Day  Patient was given verbal and tactile cues for proper form for all exercises listed above. After instruction, patient demonstrated correct technique with exercises and is able to perform them at home. Printed handouts with instructions were given for use with Home Exercise Program.     Deep Soft Tissue Massage 15 minutes  STM L iliolumbar ligament   STM L piriformis/glute med     Joint Mobilization 5 minutes  T10-L1 PAs     Modalities - Hot packs 5 minutes applied to low back/hip for relaxing properties     Assessment: Decreased soft tissue restriction in L low back/hip post manual therapy.    patient will benefit from further skilled physical therapy to increase leg strength and core stabilization to increase functional activities.     Plan: continue physical therapy - as per plan of care. Core/hip strengthening, flexibility    Any changes to plan of care, or goals: no   If so then plan of  care will be sent to MD for approval.       Ena Dawley, [place]   Blima Dessert, PTA III  PI# [phone]

## 2021-11-07 ENCOUNTER — Ambulatory Visit: Payer: Medicare PPO | Admitting: Rehabilitative and Restorative Service Providers"

## 2021-11-07 DIAGNOSIS — M25552 Pain in left hip: Secondary | ICD-10-CM

## 2021-11-07 NOTE — Progress Notes (Addendum)
Physical Therapy Treatment Note:       Diagnosis:  Therapy diagnosis: Left hip pain [M25.552]   Medical diagnosis: Left hip pain [M25.552]      Authorizing MD (First and last name) and PI#: Margaretha Seeds, MD   Onset Date: 06/28/2021     Start of Care Date: 09/17/2021   Prior Level of function: was able to get out of chair, turn and exercise without pain  Prior treatment for this diagnosis: no  Rehab potential: Good     Precautions: none     Certification period end date (for Medicare patients): 12/17/2021  Estimated Discharge Date: 12/17/21     Plan of care:  Established 09/17/2021              1 times every 1-2 week for a total of 6-12 treatments.  To include   Therapeutic Procedure  Neuromuscular Re-education  Therapeutic Activity  Traction  Deep Soft Tissue Massage  Joint Mobilization  Gait Training  Modalities: hot packs and cold packs  Home Exercise Program  Patient/Caregiver Education     Goals:   Patient will be able to sit 2 hours without increased pain to watch move with family  not met/progressing  Patient will be able to turn to left without hip pain  not met/progressing  Patient will be able to get in/out of car and drive without hip pain not met/progressing  Patient will be able to single leg stand for 10 seconds for dressing  not met/progressing  Patient will be independent with home exercise program and home recommendations in order to continually progress function  not met/progressing     Fall Risk Assessment:   No falls in past year. No throw rugs, poorly lighted areas, missing rails/grab bars in the home.   No medications or conditions that affect stability or mobility.    History of falls: mo.   Uses walker/cane etc: no.   Has potential safety issues at home: no.   Medication or condition that may affect safety: no.         Total # of visits: 5    Total Treatment Time: 40 minutes    Pre pain: NT/10  Post pain:       NT/10    Subjective: Patient reports mild L hip pain this morning, noticed  when driving over to appointment in car. Patient stated she still has pain when rolling over to L or R in bed and when turning to L. Patient stated she would like to discuss which exercises from HEP she should continue doing and when. Patient stated she will be seeing her personal trainer today at 10 am and is planning on "working on core." Patient stated ice has been helping the most with pain.     Objective: Patient seen for the following treatment:     Therapeutic exercise 40 min    Recumbent bike 5 min level 1 for hydrodynamic effects and muscular warm up    Abductor pull-aparts progression with red band around knees   Hooklying 1 x 10   Sustained bridge 1 x 10   Sustained bridge, 10 x double, 10 x R, 10 x L    Sidelying on R side, knee fully extended   Hip abduction 1 x 5   Hip flexion, abduction, then back to midline 1 x 5  Diagonal - hip flexion, toe pointed down, move leg diagonally back and up, not going into hip extension 1 x 5  Manual therapy: L hip long axis distraction with oscillations     Education: best positions for sleeping with pillow between legs, using stationary bike for cardiovascular exercise as it is non weightbearing and may be more comfortable - patient verbalized good understanding of all education given.     Updated HEP and instructed patient to perform core exercises one day and leg exercises the next day to avoid over working the muscles     Assessment: Patient very compliant with HEP and tolerated progressive hip strengthening exercises well today, indicating improvement in hip abductor strength. Patient reported no pain with today's exercises but did report being able to feel the muscles work. Patient responded well to all education given today regarding continued exercise and optimal positions to decrease pain. Patient would benefit form continued physical therapy to improve function with all activities and decrease pain.     Plan: continue physical therapy - as per plan of care.  Core/hip strengthening, flexibility    Any changes to plan of care, or goals: no   If so then plan of care will be sent to MD for approval.       Rhys Martini, PT Student

## 2021-11-07 NOTE — Progress Notes (Signed)
I was present in the room guiding the student in service delivery while the student was participating in the provision of services, and I was not engaged in treating another patient or doing other tasks at the same time     I am responsible for the assessment and plan as stated within the progress note.    M. Casey Morrigan Wickens, PT  PI# [id number]  Pager- [phone]

## 2021-11-26 ENCOUNTER — Ambulatory Visit: Payer: Medicare PPO | Admitting: Rehabilitative and Restorative Service Providers"

## 2021-11-26 DIAGNOSIS — M25552 Pain in left hip: Secondary | ICD-10-CM

## 2021-11-26 NOTE — Progress Notes (Signed)
Physical Therapy Treatment Note: 11/26/2021       Diagnosis:  Therapy diagnosis: Left hip pain [M25.552]   Medical diagnosis: Left hip pain [M25.552]      Authorizing MD (First and last name) and PI#: Margaretha Seeds, MD   Onset Date: 06/28/2021     Start of Care Date: 09/17/2021   Prior Level of function: was able to get out of chair, turn and exercise without pain  Prior treatment for this diagnosis: no  Rehab potential: Good     Precautions: none     Certification period end date (for Medicare patients): 12/17/2021  Estimated Discharge Date: 12/17/21     Plan of care:  Established 09/17/2021              1 times every 1-2 week for a total of 6-12 treatments.  To include   Therapeutic Procedure  Neuromuscular Re-education  Therapeutic Activity  Traction  Deep Soft Tissue Massage  Joint Mobilization  Gait Training  Modalities: hot packs and cold packs  Home Exercise Program  Patient/Caregiver Education     Goals:   Patient will be able to sit 2 hours without increased pain to watch move with family  not met/progressing  Patient will be able to turn to left without hip pain  not met/progressing  Patient will be able to get in/out of car and drive without hip pain not met/progressing  Patient will be able to single leg stand for 10 seconds for dressing  not met/progressing  Patient will be independent with home exercise program and home recommendations in order to continually progress function  not met/progressing     Fall Risk Assessment:   No falls in past year. No throw rugs, poorly lighted areas, missing rails/grab bars in the home.   No medications or conditions that affect stability or mobility.    History of falls: mo.   Uses walker/cane etc: no.   Has potential safety issues at home: no.   Medication or condition that may affect safety: no.          Total # of visits: 6    Total Treatment Time: 40 minutes    Pre pain: NR/10  Post pain:       NR/10    Subjective: L low back and lateral hip     Objective:  Patient seen for the following treatment:   Therapeutic Procedure 10 minutes - Cardiovascular exercise as follows: Recumbent bike x 5 minutes for warm up and blood flow   Review of HEP     Deep Soft Tissue Massage 15 minutes  STM L>R QL/iliolumbar ligament   STM L glute med/piriformis     Joint Mobilization 10 minutes  Sacral glide inferior with breathing prone over pillow   T10-L1 PAs       Modalities - Cold packs 5 minutes applied to low back/hip       Assessment: Decreased soft tissue restriction in low back/hip  post manual therapy.    patient will benefit from further skilled physical therapy to increase leg strength and core stabilization to increase functional activities.     Plan: continue physical therapy - as per plan of care. Progress core/leg strengthening, manual therapy     Any changes to plan of care, or goals: no   If so then plan of care will be sent to MD for approval.       Ena Dawley, PT Assist   Blima Dessert, PTA III  PI#  [place]

## 2021-12-04 ENCOUNTER — Encounter: Payer: Self-pay | Admitting: FAMILY PRACTICE

## 2021-12-04 MED ORDER — CLONAZEPAM 0.25 MG DISINTEGRATING TABLET: 0.2500 mg | DISINTEGRATING_TABLET | Freq: Two times a day (BID) | ORAL | 0 refills | Status: DC | PRN

## 2021-12-04 NOTE — Telephone Encounter (Signed)
Requested Prescriptions     Pending Prescriptions Disp Refills    ClonazePAM (KLONOPIN) 0.25 mg Disintegrating Tablet 30 tablet 0     Sig: Take 1 tablet by mouth two times daily if needed. Indications: anxiety (anxiety)        Last Refill: 09/25/21  LOV: 06/28/2021   NOV: 12/26/2021     Tyra Gural Reyes-Padilla MA

## 2021-12-05 NOTE — Progress Notes (Signed)
I reviewed the Physical Therapy Assistant's progress note and agree with the PTA's treatment and plan that was developed.      In the event of unattended discharge, this note will serve as discharge document.    M. Casey Lukah Goswami, PT  PI# [id number]  Pager- [phone]

## 2021-12-06 NOTE — Progress Notes (Signed)
I reviewed the Physical Therapy Assistant's progress note and agree with the PTA's treatment and plan that was developed.      In the event of unattended discharge, this note will serve as discharge document.    M. Casey Allayah Raineri, MSPT, CCS Paneled  PI# [id number]  Pager- [phone]  Phone: [phone]

## 2021-12-16 ENCOUNTER — Ambulatory Visit: Payer: Medicare PPO | Admitting: Rehabilitative and Restorative Service Providers"

## 2021-12-26 ENCOUNTER — Ambulatory Visit: Payer: Medicare PPO | Admitting: FAMILY PRACTICE

## 2021-12-26 VITALS — BP 126/70 | HR 76 | Temp 97.9°F | Resp 16 | Ht 64.0 in | Wt 142.2 lb

## 2021-12-26 DIAGNOSIS — Z1211 Encounter for screening for malignant neoplasm of colon: Secondary | ICD-10-CM

## 2021-12-26 DIAGNOSIS — F419 Anxiety disorder, unspecified: Secondary | ICD-10-CM

## 2021-12-26 DIAGNOSIS — Z1231 Encounter for screening mammogram for malignant neoplasm of breast: Secondary | ICD-10-CM

## 2021-12-26 DIAGNOSIS — Z13820 Encounter for screening for osteoporosis: Secondary | ICD-10-CM

## 2021-12-26 NOTE — Progress Notes (Signed)
Date of Service: 12/26/2021; note started 8:17 AM         Chief Complaint: Amanda Lester is a 66yrold female who comes in today for anxiety    Subjective:  Presents with anxiety, here for follow up. Doing well. Taking clonazepam PRN, sleeping better. Taking clonazepam approximately 50% of days.   Also with hip pain, seeing physical therapy. Helping.   Also with allergy. Runny. itchy eyes. Sneezing, occasional headache. No wheeze, shortness of breath.    Review of Systems  See above-- integrated in Subjective section    If significant depression was noted, please see assessment/plan section of note    Objective:  Vitals:    12/26/21 0811   BP: 126/70   SITE: left arm   Orthostatic Position: sitting   Cuff Size: regular   Pulse: 76   Resp: 16   Temp: 36.6 C (97.9 F)   TempSrc: Temporal   SpO2: 98%   Weight: 64.5 kg (142 lb 3.2 oz)   Height: 1.626 m ('5\' 4"'$ )     Body mass index is 24.41 kg/m.        06/28/2021     8:18 AM   PHQ-2   Interest 1   Feeling 0   PHQ-2 Total Score 1       Physical Exam  Constitutional:       General: She is not in acute distress.     Appearance: She is well-developed.   HENT:      Head: Normocephalic and atraumatic.      Right Ear: External ear normal.      Left Ear: External ear normal.      Mouth/Throat:      Mouth: Mucous membranes are moist.      Pharynx: Oropharynx is clear.   Eyes:      General: No scleral icterus.  Neck:      Thyroid: No thyromegaly.      Trachea: No tracheal deviation.   Cardiovascular:      Rate and Rhythm: Normal rate and regular rhythm.      Heart sounds: Normal heart sounds. No murmur heard.  Pulmonary:      Breath sounds: Normal breath sounds. No wheezing or rales.   Chest:      Chest wall: No tenderness.   Musculoskeletal:      Cervical back: Normal range of motion and neck supple.      Right lower leg: No edema.      Left lower leg: No edema.   Lymphadenopathy:      Cervical: No cervical adenopathy.   Skin:     General: Skin is warm and dry.      Findings:  No rash.   Psychiatric:         Attention and Perception: Attention normal.         Mood and Affect: Mood and affect normal.         Speech: Speech normal.         Behavior: Behavior normal.         Thought Content: Thought content normal. Thought content does not include homicidal or suicidal plan.         Cognition and Memory: Memory is not impaired (Insight normal).         Judgment: Judgment normal.       ASSESSMENT AND PLAN:  (F41.9) Anxiety  (primary encounter diagnosis)  Comment:   Plan: Stable on current treatment regimen.  Continue.  Contact  office if any problems. Follow up 6 mos.     (Z13.820) Screening for osteoporosis  Comment:   Plan: DEXA, COMPLETE            (Z12.31) Visit for screening mammogram  Comment:   Plan: BREAST MAMMOGRAM SCREENING W/TOMO W/CAD            (Z12.11) Screen for colon cancer  Comment:   Plan: GASTROENTEROLOGY REFERRAL              Ongoing medications at end of visit:  ClonazePAM (KLONOPIN) 0.25 mg Disintegrating Tablet, Take 1 tablet by mouth two times daily if needed. Indications: anxiety (anxiety)    No current facility-administered medications for this visit.      I did review patient's past medical and family/social history, no changes noted.  Barriers to Learning assessed: none. Patient verbalizes understanding of teaching and instructions.    Renato Battles, MD  Attending Physician, Family Medicine  [employer] [employer]  Electronically signed 12/26/2021 9:00 AM

## 2021-12-26 NOTE — Nursing Note (Signed)
Identifiers x 2, Vital signs taken, allergies verified, screened for pain, preferred pharmacy verified.     BP 126/70 (SITE: left arm, Orthostatic Position: sitting, Cuff Size: regular)   Pulse 76   Temp 36.6 C (97.9 F) (Temporal)   Resp 16   Ht 1.626 m ('5\' 4"'$ )   Wt 64.5 kg (142 lb 3.2 oz)   SpO2 98%   BMI 24.41 kg/m       Odetta Pink, [place]

## 2021-12-26 NOTE — Patient Instructions (Signed)
Zyrtec (cetirizine) is an antihistamine (allergy medication) that is available without a prescription. The standard adult doseage is one tablet once a day, and it should not cause drowsiness.  If it does not help, we may try some prescription antihistamines, but your insurance may not pay for these until you have tried the Zyrtec.    Ketotifen (Alaway, Zaditor, Claritin Eye, and other brands) is an antihistamine eye-drop that can be very useful for eye symptoms of allergy, usually itching, redness and discharge.  The usual dose is 1-2 drops in each eye as needed, up to a maximum of 2 drops per eye in 24 hours.

## 2022-01-01 ENCOUNTER — Ambulatory Visit: Payer: Medicare PPO

## 2022-01-01 DIAGNOSIS — Z23 Encounter for immunization: Secondary | ICD-10-CM

## 2022-01-01 NOTE — Progress Notes (Signed)
COVID-19 Vaccine Documentation:  The patient is eligible to receive the COVID-19 Vaccine at this time:Yes    Fact Sheet for Recipients and Caregivers given to the patient/caregiver for review. All patient/caregiver questions were answered and the patient/caregiver consents to the COVID-19 vaccine being given today.     Vaccine prepared and administered according to the current Emergency Use Authorization and Fact Sheet for Healthcare Providers Administering Vaccine.    Patient offered vaccine card and given discharge instructions.      COVID-19 Vaccine Observation:  The patient was observed for 30 minutes for immediate reactions to the vaccine per protocol. No reaction was observed.      Sue Mcalexander, LVN

## 2022-01-02 ENCOUNTER — Ambulatory Visit: Payer: Medicare PPO | Admitting: Rehabilitative and Restorative Service Providers"

## 2022-01-02 DIAGNOSIS — M25552 Pain in left hip: Secondary | ICD-10-CM

## 2022-01-02 NOTE — Progress Notes (Signed)
Physical Therapy Treatment Note:       Diagnosis:  Therapy diagnosis: Left hip pain [M25.552]   Medical diagnosis: Left hip pain [M25.552]      Authorizing MD (First and last name) and PI#: Margaretha Seeds, MD   Onset Date: 06/28/2021     Start of Care Date: 09/17/2021   Prior Level of function: was able to get out of chair, turn and exercise without pain  Prior treatment for this diagnosis: no  Rehab potential: Good     Precautions: none     Certification period end date (for Medicare patients): 12/17/2021  Estimated Discharge Date: 04/03/22     Plan of care:  Established 09/17/2021, updated 01/02/2022               1 times every 1-2 week for a total of 6-12 treatments.  To include   Therapeutic Procedure  Neuromuscular Re-education  Therapeutic Activity  Traction  Deep Soft Tissue Massage  Joint Mobilization  Gait Training  Modalities: hot packs and cold packs  Home Exercise Program  Patient/Caregiver Education     Goals:   Patient will be able to sit 2 hours without increased pain to watch move with family  not met/progressing  Patient will be able to turn to left without hip pain  not met/progressing  Patient will be able to get in/out of car and drive without hip pain not met/progressing  Patient will be able to single leg stand for 10 seconds for dressing  not met/progressing  Patient will be independent with home exercise program and home recommendations in order to continually progress function  not met/progressing     Fall Risk Assessment:   No falls in past year. No throw rugs, poorly lighted areas, missing rails/grab bars in the home.   No medications or conditions that affect stability or mobility.    History of falls: mo.   Uses walker/cane etc: no.   Has potential safety issues at home: no.   Medication or condition that may affect safety: no.         Total # of visits: 5    Total Treatment Time: 30 minutes    Pre pain: 0-1/10  Post pain:       same/10    Subjective: patient reports she hasn't  done the home exercise program since about 1 week after last visit.  Has continued to work with trainer, but not the specific exercises from PT.     Objective: Patient seen for the following treatment:   Therapeutic Procedure 15 minutes - Cardiovascular exercise as follows:   Recumbent stepper  x 5 minutes for warm up and blood flow   Added to home exercise program    Kneeling hip flexor   Hip externally rotated    Manual Therapy x 20 minutes  Manual stretches by PT  for patient to stay relaxed for maximal muscle relaxation and elongation  Patient positioned in supine   Single knee to chest stretch for glute and lower extremity extensors   Single knee to opposite shoulder for lateral glute stretching   Figure 4 piriformis stretch    Rotational lumbar spine stretch   Hip longitudinal distraction for traction  Gentle trigger point to pectineus and adductor brevis with leg in flexed position for soft tissue release  Right side lying trunk rotation to left     Issued patient personalized HEP2GO  handout with illustrations and instructions for intensity, frequency and duration for home exercise program.  Personal program can be accessed online by entering code [id number] at [email or web address]         Assessment: patient with hip flexor area strain, but with further investigation, pointed to more medial structure. Tightness in medial thigh limits mobility of the hip due to pain and movement restriction. Focused on gentle stretching of the hip and adding it to her personal training routine to make it easier to incorporate.   patient will benefit from further skilled physical therapy to increase leg strength and core stabilization to increase functional activities.     Plan: continue physical therapy - as per plan of care. Core/hip strengthening, flexibility    Any changes to plan of care, or goals: no   If so then plan of care will be sent to MD for approval.     M. Rose Fillers, MSPT, CCS Paneled  PI# [phone]  Pager-  [phone]  Phone: [phone]

## 2022-01-19 ENCOUNTER — Encounter: Payer: Self-pay | Admitting: FAMILY PRACTICE

## 2022-01-20 MED ORDER — CLONAZEPAM 0.25 MG DISINTEGRATING TABLET: 0.2500 mg | DISINTEGRATING_TABLET | Freq: Two times a day (BID) | ORAL | 0 refills | Status: DC | PRN

## 2022-01-20 NOTE — Telephone Encounter (Signed)
Requested Prescriptions     Pending Prescriptions Disp Refills    ClonazePAM (KLONOPIN) 0.25 mg Disintegrating Tablet 30 tablet 0     Sig: Take 1 tablet by mouth two times daily if needed. Indications: anxiety (anxiety)        Last Refill: 12/04/21  LOV: 12/26/2021   NOV: 06/30/2022     Hang Ammon Reyes-Padilla MA

## 2022-01-23 ENCOUNTER — Ambulatory Visit: Payer: Medicare PPO | Admitting: Rehabilitative and Restorative Service Providers"

## 2022-01-23 DIAGNOSIS — M25552 Pain in left hip: Secondary | ICD-10-CM

## 2022-01-23 NOTE — Progress Notes (Signed)
Physical Therapy Treatment Note:       Diagnosis:  Therapy diagnosis: Left hip pain [M25.552]   Medical diagnosis: Left hip pain [M25.552]      Authorizing MD (First and last name) and PI#: Margaretha Seeds, MD   Onset Date: 06/28/2021     Start of Care Date: 09/17/2021   Prior Level of function: was able to get out of chair, turn and exercise without pain  Prior treatment for this diagnosis: no  Rehab potential: Good     Precautions: none     Certification period end date (for Medicare patients): 12/17/2021  Estimated Discharge Date: 04/03/22     Plan of care:  Established 09/17/2021, updated 01/02/2022               1 times every 1-2 week for a total of 6-12 treatments.  To include   Therapeutic Procedure  Neuromuscular Re-education  Therapeutic Activity  Traction  Deep Soft Tissue Massage  Joint Mobilization  Gait Training  Modalities: hot packs and cold packs  Home Exercise Program  Patient/Caregiver Education     Goals:   Patient will be able to sit 2 hours without increased pain to watch move with family  not met/progressing  Patient will be able to turn to left without hip pain  not met/progressing  Patient will be able to get in/out of car and drive without hip pain not met/progressing  Patient will be able to single leg stand for 10 seconds for dressing  not met/progressing  Patient will be independent with home exercise program and home recommendations in order to continually progress function  not met/progressing     Fall Risk Assessment:   No falls in past year. No throw rugs, poorly lighted areas, missing rails/grab bars in the home.   No medications or conditions that affect stability or mobility.    History of falls: mo.   Uses walker/cane etc: no.   Has potential safety issues at home: no.   Medication or condition that may affect safety: no.         Total # of visits: 7    Total Treatment Time: 30 minutes    Pre pain: 0-1/10  Post pain:       same/10    Subjective: patient feels that her hip is  about the same overall. Clicks and catches on occasion but nothing regularly. Frustrated with lack of progress. Discussed current exercise routine, works with trainer 2x epr week and exercises with friends 3x per week with program designed by trainer. Wondering if she's doing any incorrect exercises, will e-mail or bring in workout routine next visit.     Objective: Patient seen for the following treatment:   Therapeutic Procedure 15 minutes - Cardiovascular exercise as follows:   Recumbent stepper  x 5 minutes for warm up and blood flow   Added to home exercise program    Seated with hips at 90/90 windmill    Leaning chest to front knee, backing out, then leaning to opposite knee    Squats from buttock on surface into open tall kneeling 2 x 5   [place]on leg swing stretching   Single leg bridge with slight hip drop for mobility and strength    Manual Therapy x 15 minutes  Manual stretches by PT  for patient to stay relaxed for maximal muscle relaxation and elongation  Patient positioned in supine   Single knee to chest stretch for glute and lower extremity extensors  Single knee to opposite shoulder for lateral glute stretching   Figure 4 piriformis stretch    Rotational lumbar spine stretch   Hip longitudinal distraction for traction  Gentle trigger point to pectineus and adductor brevis with leg in flexed position for soft tissue release    Issued patient personalized HEP2GO  handout with illustrations and instructions for intensity, frequency and duration for home exercise program. Personal program can be accessed online by entering code [email or web address] at [email or web address]         Assessment: patient still having the same pain with the same movements. She may have some issue with the labrum and may benefit from a sport medicine referral for assessment as it seems to be more soft tissue and contractile tissue related. Treatment today focused on hip mobility with light strengthening as straight strengthening hasn't been  as effective.    patient will benefit from further skilled physical therapy to increase leg strength and core stabilization to increase functional activities.     Plan: continue physical therapy - as per plan of care. Core/hip strengthening, flexibility    Any changes to plan of care, or goals: no   If so then plan of care will be sent to MD for approval.     M. Rose Fillers, MSPT, CCS Paneled  PI# [phone]  Pager- [phone]  Phone: [phone]

## 2022-01-24 ENCOUNTER — Telehealth: Payer: Self-pay | Admitting: Rehabilitative and Restorative Service Providers"

## 2022-01-24 NOTE — Telephone Encounter (Signed)
01/24/2022     Time: 1335    Called and left VM for patient on listed cell number.   PCP would like patient to follow up in clinic with him before further referrals to Sports Medicine.     M. Rose Fillers, MSPT, CCS Paneled  PI# [phone]  Pager- [phone]  Phone: [phone]

## 2022-02-06 ENCOUNTER — Ambulatory Visit: Payer: Medicare PPO | Admitting: Rehabilitative and Restorative Service Providers"

## 2022-02-06 DIAGNOSIS — M25552 Pain in left hip: Secondary | ICD-10-CM

## 2022-02-06 NOTE — Progress Notes (Signed)
Physical Therapy Treatment Note:       Diagnosis:  Therapy diagnosis: Left hip pain [M25.552]   Medical diagnosis: Left hip pain [M25.552]      Authorizing MD (First and last name) and PI#: Margaretha Seeds, MD   Onset Date: 06/28/2021     Start of Care Date: 09/17/2021   Prior Level of function: was able to get out of chair, turn and exercise without pain  Prior treatment for this diagnosis: no  Rehab potential: Good     Precautions: none     Certification period end date (for Medicare patients): 12/17/2021  Estimated Discharge Date: 04/03/22     Plan of care:  Established 09/17/2021, updated 01/02/2022               1 times every 1-2 week for a total of 6-12 treatments.  To include   Therapeutic Procedure  Neuromuscular Re-education  Therapeutic Activity  Traction  Deep Soft Tissue Massage  Joint Mobilization  Gait Training  Modalities: hot packs and cold packs  Home Exercise Program  Patient/Caregiver Education     Goals:   Patient will be able to sit 2 hours without increased pain to watch move with family  not met/progressing  Patient will be able to turn to left without hip pain  not met/progressing  Patient will be able to get in/out of car and drive without hip pain not met/progressing  Patient will be able to single leg stand for 10 seconds for dressing  not met/progressing  Patient will be independent with home exercise program and home recommendations in order to continually progress function  not met/progressing     Fall Risk Assessment:   No falls in past year. No throw rugs, poorly lighted areas, missing rails/grab bars in the home.   No medications or conditions that affect stability or mobility.    History of falls: mo.   Uses walker/cane etc: no.   Has potential safety issues at home: no.   Medication or condition that may affect safety: no.         Total # of visits: 8    Total Treatment Time: 30 minutes    Pre pain: 0-1/10  Post pain:       same/10    Subjective: patient just got back from  [place] and reports that she didn;t do home exercise program while there    Objective: Patient seen for the following treatment:   Therapeutic Procedure 30 minutes - Cardiovascular exercise as follows:   Recumbent stepper  x 5 minutes for warm up and blood flow   Single knee to chest stretch   Figure 4 piriformis stretch   Prone knee flexion and hip extension stretches  Added to home exercise program    Kneeling squats with hip rotated out 2 x 10   Quadruped, swinging leg out and around into lunge, 2 x 5   Single leg bridge with oppoiste hip drop and lift 2 x 5        Issued patient personalized HEP2GO  handout with illustrations and instructions for intensity, frequency and duration for home exercise program. Personal program can be accessed online by entering code : [email or web address] at [email or web address]         Assessment: patient stable, but likely due to not working on new home exercise program from last visit. Gave 3 new exercises to work on strength and mobility of the hips and patient will focus on home exercise program over the next  3-4 weeks.   patient will benefit from further skilled physical therapy to increase leg strength and core stabilization to increase functional activities.     Plan: reassess mobility and function    Any changes to plan of care, or goals: no   If so then plan of care will be sent to MD for approval.     M. Rose Fillers, MSPT, CCS Paneled  PI# [phone]  Pager- [phone]  Phone: [phone]

## 2022-03-03 ENCOUNTER — Other Ambulatory Visit: Payer: Self-pay | Admitting: GASTROENTEROLOGY

## 2022-03-03 ENCOUNTER — Ambulatory Visit: Payer: Medicare PPO | Admitting: Rehabilitative and Restorative Service Providers"

## 2022-03-03 DIAGNOSIS — M25552 Pain in left hip: Secondary | ICD-10-CM

## 2022-03-03 MED ORDER — PEG 3350-ELECTROLYTES 236 GRAM-22.74 GRAM-6.74 GRAM-5.86 GRAM SOLUTION
4000.0000 mL | Freq: Once | ORAL | 0 refills | Status: DC
Start: 2022-03-03 — End: 2022-08-12

## 2022-03-03 NOTE — Progress Notes (Signed)
Physical Therapy Treatment Note:       Diagnosis:  Therapy diagnosis: Left hip pain [M25.552]   Medical diagnosis: Left hip pain [M25.552]      Authorizing MD (First and last name) and PI#: Margaretha Seeds, MD   Onset Date: 06/28/2021     Start of Care Date: 09/17/2021   Prior Level of function: was able to get out of chair, turn and exercise without pain  Prior treatment for this diagnosis: no  Rehab potential: Good     Precautions: none     Certification period end date (for Medicare patients): 12/17/2021  Estimated Discharge Date: 04/03/22     Plan of care:  Established 09/17/2021, updated 01/02/2022               1 times every 1-2 week for a total of 6-12 treatments.  To include   Therapeutic Procedure  Neuromuscular Re-education  Therapeutic Activity  Traction  Deep Soft Tissue Massage  Joint Mobilization  Gait Training  Modalities: hot packs and cold packs  Home Exercise Program  Patient/Caregiver Education     Goals:   Patient will be able to sit 2 hours without increased pain to watch move with family  not met/progressing  Patient will be able to turn to left without hip pain  not met/progressing  Patient will be able to get in/out of car and drive without hip pain not met/progressing  Patient will be able to single leg stand for 10 seconds for dressing  not met/progressing  Patient will be independent with home exercise program and home recommendations in order to continually progress function  not met/progressing     Fall Risk Assessment:   No falls in past year. No throw rugs, poorly lighted areas, missing rails/grab bars in the home.   No medications or conditions that affect stability or mobility.    History of falls: mo.   Uses walker/cane etc: no.   Has potential safety issues at home: no.   Medication or condition that may affect safety: no.         Total # of visits: 9    Total Treatment Time: 30 minutes    Pre pain: 0-1/10  Post pain:       same/10    Subjective: patient feels about the  same    Objective: Patient seen for the following treatment:   Therapeutic Procedure 30 minutes - Cardiovascular exercise as follows:   Recumbent bike  x 5 minutes for warm up and blood flow   Sidelying hip flexor   With external rotation   Trigger point to medial hip joint line  Resisted single knee to chest pull ups   Supine with band around feet   Alternating proprioceptive neuromuscular facilitation pull ups for strength  Single leg sustained bridge with opposite hip drop rotations  Prone press up with knee flexion and internal rotation for groin stretch   Repeated with left leg in frog position for stretching         Issued patient personalized HEP2GO  handout with illustrations and instructions for intensity, frequency and duration for home exercise program. Personal program can be accessed online by entering code :  [email or web address] at [email or web address]         Assessment: patient felt slightly better after session, but overall hasn't had muck improvement. Patient should return to MD for evaluation and further work up or referral.     Plan: discharge with patient agreement to MD for re-eval and  further MD workup    Any changes to plan of care, or goals: no   If so then plan of care will be sent to MD for approval.     M. Rose Fillers, MSPT, CCS Paneled  PI# [phone]  Pager- [phone]  Phone: [phone]

## 2022-03-16 ENCOUNTER — Encounter: Payer: Self-pay | Admitting: FAMILY PRACTICE

## 2022-03-17 MED ORDER — CLONAZEPAM 0.25 MG DISINTEGRATING TABLET: 0.2500 mg | DISINTEGRATING_TABLET | Freq: Two times a day (BID) | ORAL | 0 refills | Status: DC | PRN

## 2022-03-17 NOTE — Telephone Encounter (Signed)
Requested Prescriptions     Pending Prescriptions Disp Refills    ClonazePAM (KLONOPIN) 0.25 mg Disintegrating Tablet 30 tablet 0     Sig: Take 1 tablet by mouth two times daily if needed. Indications: anxiety (anxiety)        Last Refill: 01/20/2022  LOV: 12/26/2021   NOV: 06/30/2022

## 2022-04-01 ENCOUNTER — Ambulatory Visit: Payer: Medicare PPO

## 2022-04-03 ENCOUNTER — Ambulatory Visit: Payer: Medicare PPO

## 2022-04-17 ENCOUNTER — Ambulatory Visit: Payer: Medicare PPO

## 2022-04-17 DIAGNOSIS — Z23 Encounter for immunization: Secondary | ICD-10-CM

## 2022-04-18 ENCOUNTER — Encounter: Payer: Self-pay | Admitting: FAMILY PRACTICE

## 2022-04-21 MED ORDER — CLONAZEPAM 0.25 MG DISINTEGRATING TABLET: 0.2500 mg | DISINTEGRATING_TABLET | Freq: Two times a day (BID) | ORAL | 0 refills | Status: DC | PRN

## 2022-04-21 NOTE — Telephone Encounter (Signed)
Requested Prescriptions     Pending Prescriptions Disp Refills    ClonazePAM (KLONOPIN) 0.25 mg Disintegrating Tablet 30 tablet 0     Sig: Take 1 tablet by mouth two times daily if needed. Indications: anxiety (anxiety)        Last Refill: 03/17/2022  LOV: 12/26/2021   NOV: 05/12/2022

## 2022-05-12 ENCOUNTER — Encounter: Payer: Self-pay | Admitting: FAMILY PRACTICE

## 2022-05-12 ENCOUNTER — Ambulatory Visit: Payer: Medicare PPO | Admitting: FAMILY PRACTICE

## 2022-05-12 VITALS — BP 136/73 | HR 74 | Temp 97.7°F | Resp 16 | Wt 146.6 lb

## 2022-05-12 DIAGNOSIS — M25552 Pain in left hip: Secondary | ICD-10-CM

## 2022-05-12 DIAGNOSIS — Z23 Encounter for immunization: Secondary | ICD-10-CM

## 2022-05-12 NOTE — Addendum Note (Signed)
Addended by: Oren Binet on: 05/12/2022 05:37 PM     Modules accepted: Level of Service

## 2022-05-12 NOTE — Nursing Note (Signed)
ID verified by two sources: DOB and Name.  Vital signs taken, allergies verified, screened for pain, tobacco Hx verified. Pharmacy confirmed.      Patient WAS wearing a surgical mask  Standard, eye and mask precautions were followed when caring for the patient.   PPE used by provider during encounter: Surgical mask, face shield and Gloves    Amanda Jeanlouis, MA

## 2022-05-12 NOTE — Patient Instructions (Signed)
Our anticipated turnaround time (time until you are contacted to schedule an appointment) for a routine referral is 1-2 weeks. If you have not been contacted in that time, or if you have other questions about a referral, please call the Patient Contact Center at [phone].

## 2022-05-12 NOTE — Progress Notes (Signed)
Date of Service: 05/12/2022; note started 8:19 AM  ASSESSMENT & PLAN         ICD-10-CM    1. Left hip pain  M25.552 Sports Medicine Clinic Referral  most consistent with trochanteric bursitis. Will refer for possible injection.               SUBJECTIVE         Amanda Lester is a 31yrfemale. She had concerns including Hip Pain (Left hip).    Primary problem is Musculoskeletal Problem  Degree of Activity Impairment?:  Moderately limits activities  Sleep Pattern: frequent awakening    Context:  Unknown  Pain location:  Left hip  Pain Quality:  Aching and dull  Severity of pain:  Medium  Aggravated by:  Walking and sitting  Treatments tried:  Acetaminophen, cold packs and heat packs  Swelling:  No swelling present  Taking Tylenol x approximately 3 mos. Prior history alcohol dependence. Pain going on x couple of years, OA on X-ray 8 mos ago. Prior physical therapy. Better with doing exercises consistently. 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 "Can't do lunges." Pain in right hip last 5d only, similar. Walking daily. Walking approximately 5 mi/day, not all at one time. Taking on average '2000mg'$  Tylenol/day. Last several days ibuprofen '400mg'$  a.m., 200-400 at bedtime. Occasional popping with standing.       I did review patient's past medical and family/social history, no changes noted.  Additional history obtained from: None      OBJECTIVE      Vitals:    05/12/22 0813   BP: 136/73   SITE: left arm   Orthostatic Position: sitting   Cuff Size: regular   Pulse: 74   Resp: 16   Temp: 36.5 C (97.7 F)   TempSrc: Temporal   Weight: 66.5 kg (146 lb 9.7 oz)     Body mass index is 25.16 kg/m.    Physical Exam  Constitutional:       General: She is not in acute distress.     Appearance: She is well-developed.   Eyes:      General: No scleral icterus.  Musculoskeletal:      Lumbar back: Normal. No tenderness. Negative right straight leg raise test and negative left straight  leg raise test.      Right hip: Tenderness (greater trochanter) present. No deformity or crepitus. Normal range of motion.      Left hip: No deformity, tenderness or crepitus. Normal range of motion.      Right knee: Crepitus present.      Left knee: Crepitus present.   Skin:     General: Skin is warm and dry.      Findings: No rash.           BRenato Battles MD  Attending Physician, Family Medicine  [facility]

## 2022-05-26 ENCOUNTER — Encounter: Payer: Self-pay | Admitting: FAMILY PRACTICE

## 2022-05-27 MED ORDER — CLONAZEPAM 0.25 MG DISINTEGRATING TABLET: 0.2500 mg | DISINTEGRATING_TABLET | Freq: Two times a day (BID) | ORAL | 0 refills | Status: DC | PRN

## 2022-05-27 NOTE — Telephone Encounter (Signed)
Requested Prescriptions     Pending Prescriptions Disp Refills    ClonazePAM (KLONOPIN) 0.25 mg Disintegrating Tablet 30 tablet 0     Sig: Take 1 tablet by mouth two times daily if needed. Indications: anxiety (anxiety)        Last Refill: 04/21/2022  LOV: 05/12/2022   NOV: 06/30/2022     Tressia Danas, MA

## 2022-05-29 ENCOUNTER — Encounter: Payer: Self-pay | Admitting: FAMILY PRACTICE

## 2022-05-29 MED ORDER — CLONAZEPAM 0.5 MG TABLET: 0.2500 mg | ORAL_TABLET | Freq: Two times a day (BID) | ORAL | 0 refills | Status: DC | PRN

## 2022-05-29 NOTE — Telephone Encounter (Signed)
From: Kristopher Glee  To: Renato Battles, MD  Sent: 05/29/2022 10:27 AM PST  Subject: Medication    I was told by [facility] pharmacist that they no longer make an under the tongue version of Clonazepam, but a pill version is available. But the pharmacy is waiting for a new prescription authorization from my doctor office. Can you help get this to the pharmacy? Thank you.

## 2022-05-29 NOTE — Telephone Encounter (Signed)
Refill Request  Forwarding to Clinical staff to address.      [MA:  Create an Encounter and use .MCMEDREFILL]

## 2022-05-29 NOTE — Addendum Note (Signed)
Addended by: Oren Binet on: 05/29/2022 02:29 PM     Modules accepted: Orders

## 2022-06-10 NOTE — Progress Notes (Signed)
Pre GI procedure call : Left message on patient's voicemail regarding upcoming GI appointment. Instructed to read instructions at least a week prior to appointment, arrive fifteen minutes early and secure a ride home. Informed total time at this appointment will be two-three hours. If any Covid or Flu like symptoms call [facility] until 4:30pm M-F at [phone] option # 3. Patient is scheduled for 06/17/22.    Lavonda Jumbo, Sr. LVN  [facility]  [phone]

## 2022-06-17 ENCOUNTER — Ambulatory Visit: Payer: Medicare PPO | Admitting: GASTROENTEROLOGY

## 2022-06-30 ENCOUNTER — Telehealth: Payer: Self-pay | Admitting: Surgery

## 2022-06-30 ENCOUNTER — Ambulatory Visit: Payer: Medicare PPO

## 2022-06-30 ENCOUNTER — Ambulatory Visit: Payer: Medicare PPO | Admitting: FAMILY PRACTICE

## 2022-06-30 NOTE — Telephone Encounter (Signed)
Lung ISL team first attempt to collect/update smoking history for patient chart.     I called patient and a left message on voice mail. Office number provided no additional details given.     Etheleen Mayhew, Sr. [place]

## 2022-07-01 ENCOUNTER — Ambulatory Visit: Payer: Medicare PPO | Admitting: FAMILY PRACTICE

## 2022-07-01 ENCOUNTER — Encounter: Payer: Self-pay | Admitting: FAMILY PRACTICE

## 2022-07-01 VITALS — BP 132/76 | HR 82 | Temp 98.5°F | Ht 65.0 in | Wt 147.3 lb

## 2022-07-01 DIAGNOSIS — M79645 Pain in left finger(s): Secondary | ICD-10-CM

## 2022-07-01 NOTE — Nursing Note (Unsigned)
Vital signs taken, allergies verified, screened for pain.   Verified Pharmacy     Taliyah Watrous, LVN

## 2022-07-01 NOTE — Progress Notes (Signed)
07/01/2022     CHIEF COMPLAINT: Amanda Lester is a 67yr old female who comes in today for hand.     SUBJECTIVE:  This AM - tripped and fell on knees and hand, finger pain, landed on hand. Painful but denies numbness, iced earlier today, helped. Can move digits with minimal pain.     Review of Systems  See HPI    OBJECTIVE:  BP 132/76 (SITE: right arm, Orthostatic Position: sitting, Cuff Size: regular)   Pulse 82   Temp 36.9 C (98.5 F) (Temporal)   Ht 1.651 m (5\' 5" )   Wt 66.8 kg (147 lb 4.3 oz)   SpO2 96%   BMI 24.51 kg/m         07/01/2022     4:28 PM   PHQ-2   Interest 0   Feeling 0   PHQ-2 Total Score 0       Physical Exam  Vitals and nursing note reviewed.   Constitutional:       General: She is not in acute distress.     Appearance: Normal appearance. She is well-developed. She is not ill-appearing or diaphoretic.   Eyes:      Conjunctiva/sclera: Conjunctivae normal.   Pulmonary:      Effort: Pulmonary effort is normal.   Musculoskeletal:         General: Swelling and deformity present.      Comments: Left hand - 4th and 5th digit swelling, dorsal hand ecchymosis.    Neurological:      Mental Status: She is alert.       ASSESSMENT AND PLAN:  (M79.645) Pain of finger of left hand  (primary encounter diagnosis)  Comment:   Plan: HAND 3+ VIEWS, LEFT        Cont icing, XR. Tylenol/NSAID for pain. Buddy tape.        For above conditions, discussed return precautions with new or worsening symptoms.    Ongoing medications at end of visit:    Current Outpatient Medications:     ClonazePAM (KLONOPIN) 0.5 mg Tablet, Take 0.5 tablets by mouth two times daily if needed (anxiety/insomnia). Indications: Generalized anxiety, Disp: 15 tablet, Rfl: 0    PEG-Electrolyte Soln (COLYTE, GOLYTELY) 236-22.74-6.74 -5.86 gram Liquid, Take 4,000 mL by mouth one time. May substitute with Gavilyte-C, or N, Nulytely, Colyte, Golytely, Moviprep, Plenvu, Trilyte, Take as directed per [employer] instruction form., Disp: 4000 mL, Rfl:  0    I reviewed, entered and/or updated patient's past medical and family/social history  Barriers to Learning assessed: none. Patient verbalizes understanding of teaching and instructions.      Harrington Challenger, MD MPH  [facility]    Electronically signed 07/01/2022 5:06 PM    This note was created using [employer].   Please excuse any grammatical, sound-alike, or syntax errors as likely dictation errors.

## 2022-07-02 ENCOUNTER — Other Ambulatory Visit: Payer: Self-pay | Admitting: FAMILY PRACTICE

## 2022-07-02 ENCOUNTER — Ambulatory Visit
Admission: RE | Admit: 2022-07-02 | Discharge: 2022-07-02 | Disposition: A | Discharge status: Home / Self Care | Payer: Medicare PPO | Source: Ambulatory Visit | Attending: Diagnostic Radiology | Admitting: Diagnostic Radiology

## 2022-07-02 DIAGNOSIS — S62337A Displaced fracture of neck of fifth metacarpal bone, left hand, initial encounter for closed fracture: Secondary | ICD-10-CM

## 2022-07-02 DIAGNOSIS — S62327A Displaced fracture of shaft of fifth metacarpal bone, left hand, initial encounter for closed fracture: Secondary | ICD-10-CM

## 2022-07-02 DIAGNOSIS — M79645 Pain in left finger(s): Secondary | ICD-10-CM | POA: Insufficient documentation

## 2022-07-02 NOTE — Progress Notes (Signed)
Ortho referral entered

## 2022-07-03 ENCOUNTER — Ambulatory Visit: Payer: Medicare PPO | Attending: Hand Surgery | Admitting: Hand Surgery

## 2022-07-03 ENCOUNTER — Encounter: Payer: Self-pay | Admitting: FAMILY PRACTICE

## 2022-07-03 VITALS — BP 138/80 | HR 90 | Temp 98.6°F | Ht 65.0 in | Wt 147.0 lb

## 2022-07-03 DIAGNOSIS — S62337A Displaced fracture of neck of fifth metacarpal bone, left hand, initial encounter for closed fracture: Secondary | ICD-10-CM

## 2022-07-03 DIAGNOSIS — S62339A Displaced fracture of neck of unspecified metacarpal bone, initial encounter for closed fracture: Secondary | ICD-10-CM

## 2022-07-03 NOTE — Progress Notes (Signed)
I saw this patient with the resident/physician assistant/nurse practitioner whose dictated note reflects my findings and the treatment plan we formulated together. Amanda Lester

## 2022-07-03 NOTE — Telephone Encounter (Signed)
From: Kristopher Glee  To: Junita Push, MD  Sent: 07/02/2022 4:09 PM PST  Subject: Left hand broken    Now that the X-ray indicates that I have a broken bone, what are my next steps? Thanks a bunch!

## 2022-07-03 NOTE — Telephone Encounter (Signed)
Addressed, encounter closed.     Written by Junita Push, MD on 07/02/2022  6:06 PM PST  Seen by patient Amanda Lester on 07/03/2022 11:38 AM

## 2022-07-03 NOTE — Progress Notes (Signed)
Orthopaedic Surgery Hand and Upper Extremity Clinic H&P NOTE:  07/03/2022 15:40    Patient Name: Amanda Lester  MRN: [id number]    CHIEF COMPLAINT:  L 5th MC neck fx    HPI:  Ms. Amanda Lester is a 67yrold female that is R hand dominant.     Patient sustained an injury to L 5th MC after fall onto cement on Tuesday. Today she has mild pain in L 5th MC but otherwise doing quite well. No numbness or tingling. No fevers or chills.     The patient denies pain elsewhere in the symptomatic upper extremity. The patient denies chest pain or shortness of breath.      PAST MEDICAL HISTORY:  (Not in a hospital admission)   No Known Allergies   No past medical history on file. Past Surgical History:   Procedure Laterality Date    PR CESAREAN DELIVERY ONLY  1974    C-section, low cervical    PR COLONOSCOPY FLX W/ENDOSCOPIC MUCOSAL RESECTION  01/21/2019         PR COLONOSCOPY FLX W/ENDOSCOPIC MUCOSAL RESECTION  01/24/2019           Immunization History   Administered Date(s) Administered    COVID-19, mRNA, LNP-S, 2023-2024, PF, 50 MCG/0.5 ML (MODERNA)(12 YRS AND OLDER) 04/17/2022    COVID-19, mRNA, LNP-S, 30 mcg/0.3 mL (PFIZER) (PURPLE CAP) (12 YRS & OLDER) 07/10/2019, 07/31/2019, 03/07/2020, 10/13/2020    COVID-19, mRNA, LNP-S, Bivalent, 30 mcg/0.3 Ml (PFIZER) (GRAY CAP) (12 YRS AND OLDER) 02/21/2021, 01/01/2022    Influenza, High-Dose, Quadrivalent (FLUZONE HIGH-DOSE QUADRIVALENT) 03/08/2019, 03/30/2020, 04/04/2021, 04/17/2022    Influenza, Seasonal (AFLURIA, FLULAVAL, FLUVIRIN, FLUZONE) 07/29/2016, 04/23/2017, 01/11/2019    Influenza, Seasonal, Injectable, Preservative Free 02/17/2018    Pneumococcal Conjugate PCV20, (PREVNAR 20) 10/04/2020    Pneumococcal Polysaccharide PPV23 (PNEUMOVAX 23) 11/03/2018    Tdap 01/07/2010    Tdap (ADACEL, BOOSTRIX) 05/12/2022    Zoster Recombinant ([facility] 02/28/2020, 04/28/2020           SOCIAL HISTORY:  Social History     Occupational History    Not on file   Tobacco Use    Smoking  status: Former     Packs/day: 1.00     Years: 13.00     Additional pack years: 0.00     Total pack years: 13.00     Types: Cigarettes     Quit date: 2014     Years since quitting: 10.0    Smokeless tobacco: Never   Substance and Sexual Activity    Alcohol use: Not Currently     Alcohol/week: 1.0 standard drink of alcohol     Types: 1 Standard drinks or equivalent per week     Comment: Prior dependence, quit approx 2010    Drug use: Never    Sexual activity: Not Currently       The patient's past medical, family, and social history was reviewed and confirmed.    REVIEW OF SYMPTOMS:      General: Negative   Eyes: Negative   Ear, Nose and Throat: Negative   Respiratory: Negative   Cardiovascular: Negative   Gastrointestinal: Negative   Genito-urinary: Negative   Musculoskeletal: Negative  Neurological: Negative   Psychological: Negative  HEME: Negative   ENDO: Negative   SKIN: Negative    VITALS:  Vitals:    07/03/22 1404   BP: 138/80   SITE: left arm   Orthostatic Position: sitting   Cuff Size: regular  Pulse: 90   Temp: 37 C (98.6 F)   TempSrc: Temporal   Weight: 66.7 kg (147 lb 0.8 oz)   Height: 1.651 m ('5\' 5"'$ )       EXAM:  General: NAD, AOx3  Chest: Non-labored breathing    L Hand/Wrist:  Palpable radial pulse, fingers wwp  SILT M/R/Uln    Mild TTP L 5th MC neck  Normal digital cascade without scissoring  No open wounds      LABS:  No results for input(s): "HCT" in the last 72 hours.   No results for input(s): "WBC" in the last 24 hours.    Invalid input(s): "AMYL"    PT/INR (24 hours): No results for input(s): "PT", "INR" in the last 24 hours.    Invalid input(s): "PTT"   BMP (24 hours): No results for input(s): "NA", "K", "CL", "CO2", "CR", "BUN", "GLU", "CA" in the last 24 hours.    Invalid input(s): "GFRAA", "GFRNAA"  No results for input(s): "TP", "ALB", "TBIL", "ALP", "AST", "ALT", "BILID" in the last 24 hours.  No results for input(s): "CAIONWB", "CARBOXHGBART" in the last 24 hours.     IMAGING:    XR L  hand reviewed with Dr. Zenia Resides demonstrate L 5th MC neck fx with minimal displacement or angulation      IMPRESSION AND RECOMMENDATIONS:  Ms. Amanda Lester is a 37yryear old female with L 5th MC neck fx, stable appearance and within acceptable parameters for non operative treatment.    Recommend limited weightbearing and ROM as tolerated  Follow up PRN      We discussed the patient's diagnosis and treatment options in detail today. This included a description of the associated pathology and non-operative/operative treatment options with the use of illustrations and diagrams.    Patient was seen and evaluated with Dr. AZenia Resides    WBerton Bon MD  PGY5 - Orthopaedic Surgery

## 2022-07-03 NOTE — Patient Instructions (Signed)
Thank you for choosing [facility]     Please refer to the following number for your future needs:     [phone]     Appointments, advice calls, notes for school/work, FMLA, scheduling surgery.    Authorizations for office visits, MRIs, CT scans, injections, physical therapy, or for surgeries scheduled.      If you have your images done at an outside facility, please request a CD to hand carry to your appointment.    FAX # [phone]    Radiology [phone]    EMG [phone]    Please arrive 15 minutes prior to your scheduled appointment to allow time for registration.     If you arrive more than 15 minutes late for your appointment depending on clinic capacity you may be rescheduled.       We are committed to providing you compassionate care with excellent service !

## 2022-07-08 ENCOUNTER — Encounter: Payer: Self-pay | Admitting: FAMILY PRACTICE

## 2022-07-08 ENCOUNTER — Ambulatory Visit: Payer: Medicare PPO | Admitting: Sports Medicine

## 2022-07-08 MED ORDER — CLONAZEPAM 0.5 MG TABLET: 0.2500 mg | ORAL_TABLET | Freq: Two times a day (BID) | ORAL | 0 refills | Status: DC | PRN

## 2022-07-08 NOTE — Progress Notes (Deleted)
Subjective     Amanda Lester 10yrfemale    No chief complaint on file.      HPI    Amanda WOLLINis a 89yremale presenting with left hip pain.     No past medical history on file.    Family History   Problem Relation Name Age of Onset    Alcohol Abuse Mother      Asthma Mother      Cancer Mother  [id number]      esophageal    Thyroid Mother          Details unk    Alcohol/Drug Mother      No Known Problems Father          does not knoiw her father       Past Surgical History:   Procedure Laterality Date    PR CESAREAN DELIVERY ONLY  1974    C-section, low cervical    PR COLONOSCOPY FLX W/ENDOSCOPIC MUCOSAL RESECTION  01/21/2019         PR COLONOSCOPY FLX W/ENDOSCOPIC MUCOSAL RESECTION  01/24/2019            Social History     Tobacco Use    Smoking status: Former     Packs/day: 1.00     Years: 13.00     Additional pack years: 0.00     Total pack years: 13.00     Types: Cigarettes     Quit date: 2014     Years since quitting: 10.0    Smokeless tobacco: Never   Substance Use Topics    Alcohol use: Not Currently     Alcohol/week: 1.0 standard drink of alcohol     Types: 1 Standard drinks or equivalent per week     Comment: Prior dependence, quit approx 2010    Drug use: Never       Current Outpatient Medications on File Prior to Visit   Medication Sig Dispense Refill    ClonazePAM (KLONOPIN) 0.5 mg Tablet Take 0.5 tablets by mouth two times daily if needed (anxiety/insomnia). Indications: Generalized anxiety 15 tablet 0    PEG-Electrolyte Soln (COLYTE, GOLYTELY) 236-22.74-6.74 -5.86 gram Liquid Take 4,000 mL by mouth one time. May substitute with Gavilyte-C, or N, Nulytely, Colyte, Golytely, Moviprep, Plenvu, Trilyte, Take as directed per [employer]. 4000 mL 0     No current facility-administered medications on file prior to visit.       ALLERGIES  No Known Allergies    Review of Systems   Constitutional: Negative.    Neurological: Negative.      Objective   Physical Exam  Constitutional:       Appearance:  Normal appearance. Patient is not ill-appearing.   Neurological:      Mental Status: Patient is alert.   Psychiatric:         Mood and Affect: Mood normal.         Behavior: Behavior normal.     Physical Exam    IMAGING  *** I personally reviewed and independently interpreted the images.      Assessment & Plan   There are no diagnoses linked to this encounter.    ***    ***     Total Encounter Time, including chart review, imaging review, evaluation, discussion, and orders/documentation:     Daniel C. HeCollins ScotlandD, PhD  Sports Medicine  Physical Medicine and Rehabilitation

## 2022-07-17 ENCOUNTER — Ambulatory Visit
Admission: RE | Admit: 2022-07-17 | Discharge: 2022-07-17 | Disposition: A | Discharge status: Home / Self Care | Payer: Medicare PPO | Source: Ambulatory Visit | Attending: Diagnostic Radiology | Admitting: Diagnostic Radiology

## 2022-07-17 ENCOUNTER — Ambulatory Visit (INDEPENDENT_AMBULATORY_CARE_PROVIDER_SITE_OTHER): Payer: Medicare PPO | Admitting: FAMILY PRACTICE

## 2022-07-17 VITALS — BP 126/72 | HR 80 | Temp 97.5°F | Resp 16 | Ht 65.0 in | Wt 147.7 lb

## 2022-07-17 DIAGNOSIS — M25521 Pain in right elbow: Secondary | ICD-10-CM | POA: Insufficient documentation

## 2022-07-17 DIAGNOSIS — S52101A Unspecified fracture of upper end of right radius, initial encounter for closed fracture: Secondary | ICD-10-CM

## 2022-07-17 MED ORDER — NAPROXEN 500 MG TABLET
500.0000 mg | ORAL_TABLET | Freq: Two times a day (BID) | ORAL | 0 refills | Status: AC
Start: 2022-07-17 — End: 2022-07-31

## 2022-07-17 NOTE — Nursing Note (Signed)
Patient verified by name and date of birth.   Vital signs assessed, allergies verified, screened for pain and verified pharmacy.   Davieon Stockham LVN

## 2022-07-17 NOTE — Progress Notes (Signed)
07/17/2022     CHIEF COMPLAINT: Amanda Lester is a 67yrold female who comes in today for elbow.    SUBJECTIVE:  Elbow pain, since fall on 1/23, no swelling, started day after. pain with locking. No tingling in RUE. Advil helps.          Answers submitted by the patient for this visit:  MUSCULOSKELETAL PAIN (Submitted on 07/16/2022)  Chief Complaint: MUSCULOSKELETAL PAIN  Degree of Activity Impairment?: impaired due to pain, limits athletic participation  frequent awakening: Yes  Pain Quality: aching, dull, sharp  Context: an injury  Pain location: right elbow  Severity of pain: mild  Aggravated by: any activity  stiffness: Yes  Treatments tried: acetaminophen  Swelling: no swelling present  arthritis: Yes  anxiety: Yes          Review of Systems  See HPI    OBJECTIVE:  BP 126/72 (SITE: left arm, Orthostatic Position: sitting, Cuff Size: regular)   Pulse 80   Temp 36.4 C (97.5 F) (Temporal)   Resp 16   Ht 1.651 m ('5\' 5"'$ )   Wt 67 kg (147 lb 11.3 oz)   SpO2 99%   BMI 24.58 kg/m         07/01/2022     4:28 PM   PHQ-2   Interest 0   Feeling 0   PHQ-2 Total Score 0       Physical Exam  Vitals and nursing note reviewed.   Constitutional:       General: She is not in acute distress.     Appearance: Normal appearance. She is well-developed. She is not ill-appearing or diaphoretic.   Eyes:      Conjunctiva/sclera: Conjunctivae normal.   Pulmonary:      Effort: Pulmonary effort is normal.   Neurological:      Mental Status: She is alert.   Psychiatric:         Judgment: Judgment normal.     TTP medial epicondyle w/ ROM    ASSESSMENT AND PLAN:  (M25.521) Elbow pain, right  (primary encounter diagnosis)  Comment: new s/p fall late Jan   Plan: ELBOW 3+ VIEWS, RIGHT      XR,  Compression, NSAID scheduled x 7 days        For above conditions, discussed return precautions with new or worsening symptoms.    Ongoing medications at end of visit:    Current Outpatient Medications:     ClonazePAM (KLONOPIN) 0.5 mg Tablet,  Take 0.5 tablets by mouth two times daily if needed (anxiety/insomnia). Indications: Generalized anxiety, Disp: 15 tablet, Rfl: 0    Naproxen (NAPROSYN) 500 mg Tablet, Take 1 tablet by mouth 2 times daily with meals for 14 days., Disp: 28 tablet, Rfl: 0    PEG-Electrolyte Soln (COLYTE, GOLYTELY) 236-22.74-6.74 -5.86 gram Liquid, Take 4,000 mL by mouth one time. May substitute with Gavilyte-C, or N, Nulytely, Colyte, Golytely, Moviprep, Plenvu, Trilyte, Take as directed per [facility]., Disp: 4000 mL, Rfl: 0    I reviewed, entered and/or updated patient's past medical and family/social history  Barriers to Learning assessed: none. Patient verbalizes understanding of teaching and instructions.      JRayford Halsted MD MPH  [facility]   Electronically signed 07/17/2022 8:51 AM    This note was created using [employer]   Please excuse any grammatical, sound-alike, or syntax errors as likely dictation errors.

## 2022-07-22 ENCOUNTER — Ambulatory Visit: Payer: Medicare PPO | Attending: Sports Medicine | Admitting: Sports Medicine

## 2022-07-22 VITALS — BP 114/65 | HR 79 | Temp 98.8°F | Resp 16 | Ht 65.0 in | Wt 151.3 lb

## 2022-07-22 DIAGNOSIS — M1612 Unilateral primary osteoarthritis, left hip: Secondary | ICD-10-CM | POA: Insufficient documentation

## 2022-07-22 DIAGNOSIS — M67952 Unspecified disorder of synovium and tendon, left thigh: Secondary | ICD-10-CM | POA: Insufficient documentation

## 2022-07-22 DIAGNOSIS — M25552 Pain in left hip: Secondary | ICD-10-CM | POA: Insufficient documentation

## 2022-07-22 NOTE — Progress Notes (Signed)
Subjective     Amanda Lester 74yrfemale    No chief complaint on file.      HPI    Amanda CHESTNUTis a 66yremale presenting with left hip pain.The pain is primarily located on the lateral side of the hip and radiates into the groin. She does not limp while walking.The patient explains that it began about a year ago. She has gone to PT and done accupuncture to help with the pain which has provided some relief.     No past medical history on file.    Family History   Problem Relation Name Age of Onset    Alcohol Abuse Mother      Asthma Mother      Cancer Mother  [id number]      esophageal    Thyroid Mother          Details unk    Alcohol/Drug Mother      No Known Problems Father          does not knoiw her father       Past Surgical History:   Procedure Laterality Date    PR CESAREAN DELIVERY ONLY  1974    C-section, low cervical    PR COLONOSCOPY FLX W/ENDOSCOPIC MUCOSAL RESECTION  01/21/2019         PR COLONOSCOPY FLX W/ENDOSCOPIC MUCOSAL RESECTION  01/24/2019            Social History     Tobacco Use    Smoking status: Former     Current packs/day: 0.00     Average packs/day: 1 pack/day for 13.0 years (13.0 ttl pk-yrs)     Types: Cigarettes     Start date: 2001     Quit date: 2014     Years since quitting: 10.1    Smokeless tobacco: Never   Substance Use Topics    Alcohol use: Not Currently     Alcohol/week: 1.0 standard drink of alcohol     Types: 1 Standard drinks or equivalent per week     Comment: Prior dependence, quit approx 2010    Drug use: Never       Current Outpatient Medications on File Prior to Visit   Medication Sig Dispense Refill    ClonazePAM (KLONOPIN) 0.5 mg Tablet Take 0.5 tablets by mouth two times daily if needed (anxiety/insomnia). Indications: Generalized anxiety 15 tablet 0    Naproxen (NAPROSYN) 500 mg Tablet Take 1 tablet by mouth 2 times daily with meals for 14 days. 28 tablet 0    PEG-Electrolyte Soln (COLYTE, GOLYTELY) 236-22.74-6.74 -5.86 gram Liquid Take 4,000 mL by mouth one  time. May substitute with Gavilyte-C, or N, Nulytely, Colyte, Golytely, Moviprep, Plenvu, Trilyte, Take as directed per [employer]. 4000 mL 0     No current facility-administered medications on file prior to visit.       ALLERGIES  No Known Allergies    Review of Systems   Constitutional: Negative.    Neurological: Negative.      Objective   Physical Exam  Constitutional:       Appearance: Normal appearance. Patient is not ill-appearing.   Neurological:      Mental Status: Patient is alert.   Psychiatric:         Mood and Affect: Mood normal.         Behavior: Behavior normal.     Physical Exam    LEFT HIP:    FADIR -  positive   SCOUR - positive   FADER - negative   FADER-R - negative    IMAGING  2v XR of the left hip with mild OA.  I personally reviewed and independently interpreted the images.      Assessment & Plan   Diagnoses and all orders for this visit:  Arthritis of left hip  Tendinopathy of left gluteus medius      Discussed that the pain in the hip is coming from hip arthritis. In terms of treatment, recommended PT with my specific instructions for the physical therapist. Additionally, an USG CSI can be provided to offer pain relief. Given that the patient is in minimal pain today, she defers an USG CSI at this time but wants to proceed with PT.     SCRIBE DISCLAIMER:   I, Zachary George, a trained medical scribe, scribed this noted in the presence of Dr. Laren Boom, MD.   Electronically signed by Carlynn Purl, 07/22/2022  9:13 AM     I, Dr. Laren Boom MD,PhD have reviewed and confirmed the information stated by the scribe and made corrections and edits as appropriate. I have personally provided the services documented by the scribe including interviewing and examining the patient and formulating the assessment and plan.

## 2022-07-22 NOTE — Nursing Note (Signed)
Vital signs taken, screened for pain, allergies reviewed.  Gatlyn Lipari, MA2

## 2022-07-31 ENCOUNTER — Ambulatory Visit: Payer: Medicare PPO

## 2022-08-05 NOTE — Progress Notes (Signed)
Pre GI procedure call : Left message on patient's voicemail regarding upcoming GI appointment. Instructed to read instructions at least a week prior to appointment, arrive fifteen minutes early and secure a ride home. Informed total time at this appointment will be two-three hours. If any Covid or Flu like symptoms call [facility] until 4:30pm M-F at [phone] option # 3. Patient is scheduled for 3/5.    Lavonda Jumbo, Sr. LVN  [facility]  [phone]

## 2022-08-12 ENCOUNTER — Encounter: Payer: Self-pay | Admitting: GASTROENTEROLOGY

## 2022-08-12 ENCOUNTER — Encounter: Payer: Self-pay | Admitting: FAMILY PRACTICE

## 2022-08-12 ENCOUNTER — Ambulatory Visit
Admission: RE | Admit: 2022-08-12 | Discharge: 2022-08-12 | Disposition: A | Discharge status: Home / Self Care | Payer: Medicare PPO | Source: Ambulatory Visit | Attending: GASTROENTEROLOGY | Admitting: GASTROENTEROLOGY

## 2022-08-12 DIAGNOSIS — Z86010 Personal history of colonic polyps: Secondary | ICD-10-CM | POA: Insufficient documentation

## 2022-08-12 DIAGNOSIS — K529 Noninfective gastroenteritis and colitis, unspecified: Secondary | ICD-10-CM | POA: Insufficient documentation

## 2022-08-12 DIAGNOSIS — K6389 Other specified diseases of intestine: Secondary | ICD-10-CM | POA: Insufficient documentation

## 2022-08-12 DIAGNOSIS — K6289 Other specified diseases of anus and rectum: Secondary | ICD-10-CM

## 2022-08-12 DIAGNOSIS — Z1211 Encounter for screening for malignant neoplasm of colon: Secondary | ICD-10-CM | POA: Insufficient documentation

## 2022-08-12 MED ORDER — FLUMAZENIL 0.1 MG/ML INTRAVENOUS SOLUTION
0.2000 mg | INTRAVENOUS | Status: DC | PRN
Start: 2022-08-12 — End: 2022-08-18

## 2022-08-12 MED ORDER — FENTANYL (PF) 50 MCG/ML INJECTION SOLUTION
12.5000 ug | INTRAMUSCULAR | Status: DC | PRN
Start: 2022-08-12 — End: 2022-08-18
  Administered 2022-08-12: 150 ug via INTRAVENOUS
  Filled 2022-08-12: qty 4

## 2022-08-12 MED ORDER — SIMETHICONE 40 MG/0.6 ML ORAL DROPS,SUSPENSION
40.0000 mg | Freq: Once | ORAL | Status: AC
Start: 2022-08-12 — End: 2022-08-12
  Administered 2022-08-12: 40 mg
  Filled 2022-08-12: qty 0.6

## 2022-08-12 MED ORDER — LACTATED RINGERS IV INFUSION
INTRAVENOUS | Status: DC
Start: 2022-08-12 — End: 2022-08-18

## 2022-08-12 MED ORDER — NALOXONE 0.4 MG/ML INJECTION SOLUTION
0.4000 mg | INTRAMUSCULAR | Status: DC | PRN
Start: 2022-08-12 — End: 2022-08-18

## 2022-08-12 MED ORDER — EPINEPHRINE 0.1 MG/ML INJECTION SYRINGE
0.3000 mg | INJECTION | INTRAMUSCULAR | Status: DC | PRN
Start: 2022-08-12 — End: 2022-08-18

## 2022-08-12 MED ORDER — MIDAZOLAM 1 MG/ML INJECTION SOLUTION
0.5000 mg | INTRAMUSCULAR | Status: DC | PRN
Start: 2022-08-12 — End: 2022-08-18
  Administered 2022-08-12: 6 mg via INTRAVENOUS
  Filled 2022-08-12: qty 8

## 2022-08-12 MED ORDER — EPINEPHRINE 0.1 MG/ML INJECTION SYRINGE
1.0000 mg | INJECTION | INTRAMUSCULAR | Status: DC | PRN
Start: 2022-08-12 — End: 2022-08-18

## 2022-08-12 MED ORDER — NACL 0.9% IV INFUSION
INTRAVENOUS | Status: DC
Start: 2022-08-12 — End: 2022-08-18

## 2022-08-12 MED ORDER — DIPHENHYDRAMINE 50 MG/ML INJECTION SOLUTION
12.5000 mg | INTRAMUSCULAR | Status: DC | PRN
Start: 2022-08-12 — End: 2022-08-18
  Administered 2022-08-12: 50 mg via INTRAVENOUS
  Filled 2022-08-12: qty 1

## 2022-08-12 MED ORDER — ATROPINE 0.1 MG/ML INJECTION SYRINGE
0.5000 mg | INJECTION | INTRAMUSCULAR | Status: DC | PRN
Start: 2022-08-12 — End: 2022-08-18

## 2022-08-12 NOTE — Discharge Instructions (Signed)
Discharge Instructions for colonoscopy    Findings:  - Inflamed tissue of the lower colon (biopsies obtained). The rest of the colon tissue appeared normal.  - Diverticulosis     Activity:    Rest today, resume usual activity tomorrow     Diet:    Resume usual diet:     Medication:    Resume all usual medications    Follow up:   We will notify you of your biopsy results via Mychart, phone call or letter.If you do not receive notice of your results by three weeks, please call us at the phone number provided below.      During your procedure, air was pumped into your GI tract so your doctor could see clearly to make a diagnosis and/or treat your problem.     Some possible side effects you may experience are:   - Discomfort due to a distended (bloated) abdomen which will subside after a few hours to two days.   - Nausea may be a side effect of the medication and will subside.   - The medications you received may make you dizzy and sleepy, it is important that you do not drive, operate machinery or drink alcohol for at least one day.   - Severe pain is not expected and should be reported    Other side effects may include:  Flex. Sig. Or Colonoscopy: A small amount of diarrhea may follow the exam.    If problems, call [phone], Option 3 during business hours Monday through Friday 7:30am to 4:30 pm.  After hours call [phone] and ask to speak to the GI Fellow on call.

## 2022-08-12 NOTE — H&P (Signed)
Gastroenterology/hepatology Pre-procedure History & Physical   IMMEDIATE PRE-SEDATION ASSESSMENT    Referring provider: Margaretha Seeds, MD      Procedure: Colonoscopy      HPI:  Amanda Lester is a 67yrold female who presents for colonoscopy for previous adenomatous polyp.       Currently, feeling well, no acute symptoms.     Hemoglobin (g/dL)   Date Value   10/04/2020 12.5     Platelet Count   Date Value Ref Range Status   10/04/2020 216 130 - 400 K/MM3 Final     No results found for: "INR"  Creatinine Serum   Date Value Ref Range Status   07/11/2021 0.84 0.51 - 1.17 mg/dL Final       History:  No past medical history on file.      Current Outpatient Medications:     ClonazePAM (KLONOPIN) 0.5 mg Tablet, Take 0.5 tablets by mouth two times daily if needed (anxiety/insomnia). Indications: Generalized anxiety, Disp: 15 tablet, Rfl: 0    Current Facility-Administered Medications:     Atropine Injection 0.5 mg, 0.5 mg, IV, PRN X 1, Cassie Shedlock, JGennette Pac MD    DiphenhydrAMINE (BENADRYL) Injection 12.5-25 mg, 12.5-25 mg, IV, Q3MIN PRN, MEmilee Hero JGennette Pac MD    Epinephrine Injection (ADRENALIN) 0.1 mg/mL 0.3-1 mg, 0.3-1 mg, SUBMUCOSAL, PRN X 1, Brinda Focht, JGennette Pac MD    Epinephrine Injection (ADRENALIN) 0.1 mg/mL 0.3-1 mg, 0.3-1 mg, Flush through endoscope, PRN X 1, Xayla Puzio, JGennette Pac MD    Epinephrine Injection (ADRENALIN) 0.1 mg/mL 1 mg, 1 mg, IV, PRN X 1, Zamauri Nez, JGennette Pac MD    Fentanyl (SUBLIMAZE) Injection 12.5-50 mcg, 12.5-50 mcg, IV, Q3MIN PRN, MEmilee Hero JGennette Pac MD    Flumazenil (ROMAZICON) Injection 0.2 mg, 0.2 mg, IV, Q1MIN PRN, MEmilee Hero JGennette Pac MD    Lactated Ringers Infusion, , IV, CONTINUOUS, Lourdez Mcgahan, JGennette Pac MD    Midazolam (VERSED) Injection 0.5-2 mg, 0.5-2 mg, IV, Q3MIN PRN, MEmilee Hero JGennette Pac MD    NaCl 0.9% Infusion, , IV, CONTINUOUS, Alazne Quant, JGennette Pac MD    Naloxone ([facility] Injection 0.4 mg, 0.4 mg, IV, Q3MIN PRN, MEmilee Hero JGennette Pac  MD    Simethicone Oral Drops (MYLICON) 40 [id number] Suspension 40-80 mg, 40-80 mg, Flush through endoscope, ONCE, Jyl Chico, JGennette Pac MD    Social History:  Social History     Socioeconomic History    Marital status: MARRIED   Tobacco Use    Smoking status: Former     Current packs/day: 0.00     Average packs/day: 1 pack/day for 13.0 years (13.0 ttl pk-yrs)     Types: Cigarettes     Start date: 2001     Quit date: 2014     Years since quitting: 10.1    Smokeless tobacco: Never   Substance and Sexual Activity    Alcohol use: Not Currently     Comment: 2 a month    Drug use: Never    Sexual activity: Not Currently       The patient's past medical, family, and social history was reviewed and confirmed.    ROS: 10 point ROS was obtained and is negative except as mentioned in the HPI.      Physical exam:   General Appearance: alert, no distress, pleasant affect, cooperative.  Eyes: conjunctivae and corneas clear. sclerae normal.  Mouth: normal.  Heart: normal rate and regular rhythm.  Lungs: clear to auscultation.  Abdomen: BS normal. Abdomen soft, non-tender.  Extremities:  no edema.       Labs/imaging:   Lab Results   Lab Name Value Date/Time    WBC 4.6 10/04/2020 01:29 PM    HGB 12.5 10/04/2020 01:29 PM    HCT 35.9 (L) 10/04/2020 01:29 PM    PLT 216 10/04/2020 01:29 PM                 Impression/ plan:   Amanda Lester is a 67yrold female  who presents for colonoscopy.      Pre-Sedation/Anesthesia Assessment:    Patient is a candidate for moderate sedation.   Patient is ASA status: 1 - Normal healthy patient    Airway Assessment:    Normal  ROM normal    Per pt ok to discuss bx/procedure results with family and/or leave message on voicemail.    The procedure, risks, benefits, and alternatives were explained.  All patient questions were answered.  The informed consent was signed, and will be scanned into the computer database at a later date.     Patient barriers to learning: none     Patient/family  understanding: verbalizes       JJones Skene MD  Associate Professor   Division of Gastroenterology/Hepatology  Pager: [phone] [place]#: [phone]

## 2022-08-12 NOTE — Nurse Focus (Signed)
Amount of time spent educating patient: 30 minutes    Education       Title: Endoscopy (Done)       Topic: Overview (Done)       Point: Indications (Done)       Description:   Indications for a GI endoscopy may include:       biopsy    difficulty swallowing    gastric or esophageal ulcer    GI bleeding    unexplained anemia    persistent heartburn    persistent vomiting    persistent reflux symptoms despite therapy    cancer screening    abnormal radiologic finding    foreign body removal    diagnostic for inflammatory bowel disease    Focus education on patient-specific indication.                Patient Friendly Description: There are many reasons to have this procedure.    The doctor will talk to you about why it is important for you.    Please ask questions if you do not understand.              Learning Progress Summary             Patient Acceptance, E, VU by [facility] at 08/12/2022 1010                         Point: Procedure Review (Done)       Description:   Gastrointestinal (GI) endoscopy is a procedure to examine the lining of the esophagus, stomach, upper small intestine, colon or rectum.       A flexible, lighted fiber optic scope is used.    Review anticipated anesthesia type.    Post-procedure care will include:    monitoring during sedation recovery    IV fluid    gradual diet advancement                Patient Friendly Description: A gastrointestinal (GI) endoscopy is a long, flexible tube.    It looks at the lining of parts of your digestive system.    It is put down the throat or in your bottom.    A light and a tiny camera is at the end of the scope. Other special tools can be used through a hole in the scope.    Medicine will help make you sleepy and comfortable.              Learning Progress Summary             Patient Acceptance, E, VU by [facility] at 08/12/2022 1010                                 Topic: Self-Management (Done)       Point: Activity (Done)       Description:   Review post-discharge activity  restrictions which may include:       driving    work/school    Someone should be with the patient to make sure he/she is safe until fully recovered from sedation.                Patient Friendly Description: Follow the doctor's instructions for activity.    Rest for today. Do not try to do too much.    There may be limits set for:    work,  school    playing sports, exercise    driving as applicable    use of tools or appliances    signing of legal papers    Someone should stay with you until you are safe or have completely recovered from sedation.              Learning Progress Summary             Patient Acceptance, E, VU by [facility] at 08/12/2022 1010                         Point: Fluid/Food Intake (Done)       Description:   Encourage advancing diet gradually once swallowing has returned to normal and patient is alert.                   Patient Friendly Description: After the procedure, your throat might be numb. When you can take small sips of water without coughing, you may start eating and drinking.              Learning Progress Summary             Patient Acceptance, E, VU by [facility] at 08/12/2022 1010                         Point: Resuming Home Medication (Done)       Description:   Resume regular medications as directed by provider after procedure.       Patient may need to hold anticoagulants, sedatives and narcotics for a specified amount of time.                Patient Friendly Description: The doctor will tell you when it is safe to take home medicines.    Depending on the procedure, you may be asked to wait to take some medicines again.    This might include:    blood thinners    pain medicine    medicines that help you relax              Learning Progress Summary             Patient Acceptance, E, VU by [facility] at 08/12/2022 1010                         Point: Promote Flatus Passing (Done)       Description:   Encourage gas expulsion and alternating positions to facilitate.                   Patient Friendly  Description: Air was used to help see better during the procedure.    Extra air can give your child/you stomach pain after the procedure.    It will be important to pass gas and burp the extra air out. Walking and changing position helps move air up or down.    Right side-lying and fetal position (knees to the chest) might help with comfort.              Learning Progress Summary             Patient Acceptance, E, VU by [facility] at 08/12/2022 1010                                 Topic: When to  Seek Medical Attention (Done)       Point: Nausea and Vomiting (Done)       Description:   Provider (specify) should be called if:       unable to tolerate food, drink or medication    loss of appetite    symptoms do not resolve    Immediate medical care should be sought if:    forceful vomiting    blood in stool or vomit    symptoms become worse                Patient Friendly Description: Do not force yourself to eat, but try to sip some clear liquids.    Call the doctor if:    you can't keep food or drink down without getting sick    you don't have an appetite    you do not feel better    Get medical care right away if:    you see blood in your poop (stool) or vomit    you feel worse              Learning Progress Summary             Patient Acceptance, E, VU by [facility] at 08/12/2022 1010                         Point: Sleepiness, Persistent (Done)       Description:   Provider (specify) should be called if:       continued sedation effects the next day    symptoms do not resolve    Immediate medical care should be sought if:    difficult to arouse/change in level of consciousness    symptoms become worse                Patient Friendly Description: Call the doctor if:    you have a hard time waking up the next day    symptoms do not get better              Learning Progress Summary             Patient Acceptance, E, VU by [facility] at 08/12/2022 1010                                 Topic: Text (Done)       Point: Colonoscopy, Adult (Done)        Description:   Handout for Patient                   Patient Friendly Description: Education for You           Patient Materials:  Elsevier Document     Learning Progress Summary             Patient Acceptance, E, VU by [facility] at 08/12/2022 1010                         Point: Colonoscopy, Adult, Care After (Done)       Description:   Handout for Patient                   Patient Friendly Description: Education for You           Patient Materials:  Elsevier Document     Learning Progress Summary  Patient Acceptance, E, VU by [facility] at 08/12/2022 1010                                         User Key       Initials Effective Dates Name Provider Type Discipline    [facility] 03/05/22 Donnamarie Poag, Allyson, RN .NURSE: (RN or LVN) nurse

## 2022-08-13 MED ORDER — CLONAZEPAM 0.5 MG TABLET: 0.2500 mg | ORAL_TABLET | Freq: Two times a day (BID) | ORAL | 0 refills | Status: DC | PRN

## 2022-08-13 NOTE — Telephone Encounter (Signed)
Requested Prescriptions     Pending Prescriptions Disp Refills    ClonazePAM (KLONOPIN) 0.5 mg Tablet 15 tablet 0     Sig: Take 0.5 tablets by mouth two times daily if needed (anxiety/insomnia). Indications: Generalized anxiety        Last Refill: 07/08/2022  LOV: 07/17/2022   NOV: 10/29/2022

## 2022-08-14 ENCOUNTER — Telehealth: Payer: Self-pay | Admitting: GASTROENTEROLOGY

## 2022-08-14 LAB — SURGICAL PATHOLOGY

## 2022-08-14 NOTE — Telephone Encounter (Addendum)
First attempt to schedule patient a follow up appointment with Dr. Radene Knee.  Patient did not answer, LVM to call back.    Tedra Senegal,   Medical Assistant II  Department of GI & Hepatology    ----- Message from Cecille Amsterdam, MD sent at 08/14/2022 11:23 AM PST -----  Please schedule patient follow up with Dr. Radene Knee.     Dr. Emilee Hero

## 2022-08-15 NOTE — Progress Notes (Addendum)
MR#: [id number]   DOB: 07-09-1955   DOS: 08/29/2022       CC:  Abnormal colonoscopy    Dear Oren Binet, Joneen Caraway, MD,       We had the pleasure of consulting with your patient, Amanda Lester, in gastroenterology clinic today for evaluation of rectosigmoid colitis seen on colonoscopy. As you know, the patient has a past medical history of colonic polyps.     Patient previously had colonoscopy 01/2019 which showed two tubular adenomas and two sessile serrated adenomas that were removed. There was also focally active diverticular disease associated colitis on sigmoid biopsy. Repeat colonoscopy was done 08/2022 and it showed inflammation in the rectosigmoid colon and rectum. Biopsies showed focal acute colitis in rectosigmoid and mild acute proctitis in rectum. There is some evidence of chronicity. Per path report, differential includes infection, medication injury, emerging IBD.     Reports normal bowel movements. Has about 1-2 BM every day, normal consistency. No abdominal pain, nausea, vomiting, dysphagia. Since then, she has not noticed any blood. Four years ago prior to her first colonoscopy, she did notice some blood in her stools but it was thought to be due to hemorrhoids seen on colonoscopy.  No personal history of autoimmune disease. No family history of autoimmune disease or IBD.    Was taking a lot of Tylenol and Advil for several years, was switched to naproxen 2/8 which she took for about 10-14 days. Since colonoscopy, hasn't needed any pain medications. Has been doing acupuncture and yoga which is helping with pain.      ROS:  A complete review of systems was completed. All systems were negative except as above in HPI.    Allergies: No Known Allergies        Medications: Medication reconciliation was performed today.     Current Outpatient Medications   Medication Sig    ClonazePAM (KLONOPIN) 0.5 mg Tablet Take 0.5 tablets by mouth two times daily if needed (anxiety/insomnia). Indications: Generalized  anxiety     No current facility-administered medications for this visit.         Past Medical History: No past medical history on file.      Past Surgical History:   Past Surgical History:   Procedure Laterality Date    PR CESAREAN DELIVERY ONLY  1974    C-section, low cervical    PR COLONOSCOPY FLX W/ENDOSCOPIC MUCOSAL RESECTION  01/21/2019         PR COLONOSCOPY FLX W/ENDOSCOPIC MUCOSAL RESECTION  01/24/2019             Family History:   Family History   Problem Relation Name Age of Onset    Alcohol Abuse Mother      Asthma Mother      Cancer Mother  60        esophageal    Thyroid Mother          Details unk    Alcohol/Drug Mother      No Known Problems Father          does not knoiw her father       Social History:   Social History     Socioeconomic History    Marital status: MARRIED     Spouse name: Not on file    Number of children: Not on file    Years of education: Not on file    Highest education level: Not on file   Occupational History    Not on  file   Tobacco Use    Smoking status: Former     Current packs/day: 0.00     Average packs/day: 1 pack/day for 13.0 years (13.0 ttl pk-yrs)     Types: Cigarettes     Start date: 2001     Quit date: 2014     Years since quitting: 10.2    Smokeless tobacco: Never   Substance and Sexual Activity    Alcohol use: Not Currently     Comment: 2 a month    Drug use: Never    Sexual activity: Not Currently   Other Topics Concern    Not on file   Social History Narrative    Not on file     Social Determinants of Health     Financial Resource Strain: Not on file   Food Insecurity: Not on file   Transportation Needs: Not on file   Physical Activity: Not on file   Stress: Not on file   Social Connections: Not on file   Intimate Partner Violence: Not on file   Housing Stability: Not on file        Vitals:    08/29/22 0940   BP: (!) 142/78   SITE: right arm   Orthostatic Position: sitting   Cuff Size: regular   Pulse: 93   Resp: 16   Temp: 36.7 C (98 F)   TempSrc: Temporal    SpO2: 98%   Weight: 68.1 kg (150 lb 2.1 oz)   Height: 1.651 m (5\' 5" )       Physical Exam:   Physical Exam  Constitutional:       Appearance: Normal appearance.   HENT:      Head: Normocephalic and atraumatic.   Eyes:      Extraocular Movements: Extraocular movements intact.   Cardiovascular:      Rate and Rhythm: Normal rate.      Pulses: Normal pulses.   Pulmonary:      Effort: No respiratory distress.      Breath sounds: No wheezing.   Abdominal:      General: There is no distension.      Palpations: Abdomen is soft.      Tenderness: There is no abdominal tenderness. There is no guarding.   Musculoskeletal:      Right lower leg: No edema.      Left lower leg: No edema.   Skin:     General: Skin is warm and dry.   Neurological:      General: No focal deficit present.      Mental Status: She is alert.   Psychiatric:         Mood and Affect: Mood normal.         Behavior: Behavior normal.         Labs:     BCP:   Lab Results   Lab Name Value Date/Time    NA 141 07/11/2021 09:14 AM    K 4.0 07/11/2021 09:14 AM    CL 103 07/11/2021 09:14 AM    CO2 27 07/11/2021 09:14 AM    BUN 11 07/11/2021 09:14 AM    CR 0.84 07/11/2021 09:14 AM    GLU 101 07/11/2021 09:14 AM       LFT:   Lab Results   Lab Name Value Date/Time    AST 18 07/11/2021 09:14 AM    ALT 20 07/11/2021 09:14 AM    ALP 94 07/11/2021 09:14 AM    ALB  4.1 07/11/2021 09:14 AM    TP 7.3 07/11/2021 09:14 AM    TBIL 0.3 07/11/2021 09:14 AM       CBC:   Lab Results   Lab Name Value Date/Time    WBC 4.6 10/04/2020 01:29 PM    HGB 12.5 10/04/2020 01:29 PM    HCT 35.9 (L) 10/04/2020 01:29 PM    PLT 216 10/04/2020 01:29 PM         Imaging:The following reports were reviewed:  No pertinent recent imaging    Procedures:The following endoscopy reports were reviewed:    - Colonoscopy 08/12/2022  Inflammation in the rectosigmoid colon and rectum. 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   PATH:  A.        COLON, RECTO-SIGMOID (BIOPSY):  -   Focal acute colitis, patchy increase in chronic inflammation within the lamina propria and focal paneth cell metaplasia, see comment           B.        RECTUM (BIOPSY):  -   Mild acute proctitis with mild architectural distortion and mild increase in chronic inflammation within the lamina propria, see comment     COMMENT: The rectum and rectosigmoid colon show mild acute and chronic inflammation, with some features of chronicity like mild architectural distortion and focal paneth cell metaplasia. No viral cytopathic effect, granulomas or dysplasias are present in the given biopsy samples. The differential diagnosis for the above findings include infections, medication induced injury and emerging inflammatory bowel disease. Correlation with clinical and endoscopic findings is suggested.    - Colonoscopy 01/21/2019  1) Four polyps resected.  2) Diverticulosis, moderate-severe in the left colon with erythema which may represent bowel preparation artifact or segmental colitis associated with diverticula  3) Hemorrhoids this is the likely etiology of hematochezia.  A.        COLON, CECUM, POLYP x 2 (4-68mm) (SNARE POLYPECTOMY):  -   Fragments of tubular adenomas           B.        COLON, CECUM, POLYP x 1 (24mm) (SNARE POLYPECTOMY):  -   Fragments of sessile serrated adenoma         C.        COLON, ASCENDING, POLYP x 1 (14mm) (SNARE POLYPECTOMY):  -   Fragments of sessile serrated adenoma         D.        COLON, SIGMOID (BIOPSY):  -   Focally active diverticular disease associated colitis (see Comment)     COMMENT: The endoscopical impression of moderate-severe diverticulosis in the left colon is noted.      Assessment and Plan:     Amanda Lester is a 77yr female with diverticulosis, colonic polyps, arthritis who presents to GI clinic for evaluation of colitis and proctitis with chronicity noted on colonoscopy.    #Colitis and  proctitis, possibly medication vs. IBD     Colonoscopy on 3/5 showed inflammation in the rectosigmoid and rectum, biopsies showed focal acute colitis/proctitis with some evidence of chronicity. Patient denies any symptoms to suggest infectious etiology. No personal or family history of autoimmune conditions or IBD. She was taking a decent amount of NSAIDs (advil and about two weeks of naproxen) prior to her colonoscopy which may cause NSAID-induced colitis. Will plan to have her avoid NSAIDs for three months and repeat flexible sigmoidoscopy to r/o  medication induced colitis. If there is still significant inflammation on flex sig with cessation of NSAIDs, then will consider treatment for possible underlying IBD.  - Avoid NSAIDs completely (e.g. advil and naproxen)  - Ordered flexible sigmoidoscopy with moderate sedation in about 3 months (scheduled 12/02/22)  - Follow-up after flexible sigmoidoscopy (scheduled 12/19/22)    #Colon Cancer Screening  Last colonoscopy 08/12/2022 no polyps. Prior colonoscopy in 2020 showed two tubular adenomas and two SSAs (one 33mm).   - Next colonoscopy for polyp surveillance pending above work-up       Follow-up:  - Flex sig on 6/25, office visit on 7/12      This patient was discussed with Dr. Emilee Hero.    Thank you for allowing [place] to participate in the care of this patient. Please do not hesitate to contact us with any questions.    Sincerely,    Derwood Kaplan, MD  Gastroenterology/Hepatology Fellow, PGY4      I saw and evaluated the patient and reviewed the EMR. The patient was discussed in detail with the fellow.  I agree with the above history, physical examination, impression and recommendations as detailed in the fellow's note above (as edited by myself) that was formulated together with the fellow.    Jones Skene, MD  Associate Professor   Division of Gastroenterology/Hepatology         - A total of 30 minutes was spent on this visit; more than 50% of the time was spent  counseling the patient regarding colonoscopy findings. The remainder of the time was spent on review of the patient record (including but not limited to clinical notes, outside records, laboratory and radiographic studies), medical decision-making, and documentation of the visit. The patient verbalizes understanding and agrees with the plan of care.    EDUCATION:  I educated/instructed the patient or caregiver regarding all aspects of the above stated plan of care.  The patient or caregiver indicated understanding.      [employer] interpreter was not used.

## 2022-08-22 ENCOUNTER — Ambulatory Visit: Payer: Medicare PPO

## 2022-08-29 ENCOUNTER — Encounter: Payer: Self-pay | Admitting: Student in an Organized Health Care Education/Training Program

## 2022-08-29 ENCOUNTER — Ambulatory Visit
Payer: Medicare PPO | Attending: GASTROENTEROLOGY | Admitting: Student in an Organized Health Care Education/Training Program

## 2022-08-29 VITALS — BP 125/73 | HR 93 | Temp 98.0°F | Resp 16 | Ht 65.0 in | Wt 150.1 lb

## 2022-08-29 DIAGNOSIS — K6289 Other specified diseases of anus and rectum: Secondary | ICD-10-CM | POA: Insufficient documentation

## 2022-08-29 DIAGNOSIS — K529 Noninfective gastroenteritis and colitis, unspecified: Secondary | ICD-10-CM | POA: Insufficient documentation

## 2022-08-29 DIAGNOSIS — Z86010 Personal history of colonic polyps: Secondary | ICD-10-CM | POA: Insufficient documentation

## 2022-08-29 NOTE — Patient Instructions (Signed)
[facility]  FLEXIBLE SIGMOIDOSCOPY PREPARATION INSTRUCTIONS    For proper bowel preparation, please read and follow these instructions:   If you need to cancel or reschedule appointment, please call at least 3 days in advance.    If you choose to have sedation you must have a driver who will wait at the clinic or be available to return within 15 minutes of a phone call. No bus, taxi, Lyft, Uber allowed unless you are with an adult friend or family member. No driving the remainder of the day.    What is Sigmoidoscopy?  A trained examiner will examine the last 1/3 of your intestine using a thin, flexible tube (sigmoidoscope) that contains a tiny light and camera. It is very important that your intestine be cleaned out using a special preparation before the examination. The examiner is looking for abnormal tissue, such as cancer and polyps (benign growths that may become cancerous). Air must be pumped into your intestine to expand it so it may be seen better. This may make you feel bloated and uncomfortable for a short time. If you are uncomfortable during the procedure, taking slow, deep breaths may help. Small samples of tissue (biopsy) may be taken through the sigmoidoscope. The risks for this procedure are rare and include infection, bleeding, and a tear in the wall of the intestine. The examiner will discuss the risks in detail with you before the procedure.     Supplies you will need to purchase:    - 1 10-ounce bottle of CLEAR Magnesium Citrate liquid.     - 2 Fleet (saline laxative) enemas (may be purchased at most pharmacies).    7 Days Before Procedure     - If you are taking blood thinners such as: clopidogrel bisulfate (Plavix), warfarin (Coumadin, Jantoven), rivaroxaban (Xarelto), apixaban (Eliquis), ticagrelor (Brilinta), dabigatran (Pradaxa), or heparin contact the prescribing physician right away for instruction.    - If you have diabetes and are using insulin or are taking oral diabetes  medications, contact your primary care doctor for instruction on adjusting these medications for the day of your procedure.    5 Days Before Procedure    - Avoid ibuprofen (Motrin, Advil), naproxen (Aleve, Naprosyn), celecoxib (Celebrex), or medications containing aspirin 5 days prior to procedure. Okay to take aspirin 81 mg and 325 mg. No iron and fiber supplements. You may take acetaminophen (Tylenol) if needed.     1 Day Before Procedure    - You may eat a regular breakfast and lunch. After lunch you may only have clear liquids. Examples of clear liquids are clear broth, jello, Gatorade, clear fruit juice such as white grape juice or apple juice, popsicles, coffee and tea without creamer. Avoid red or purple drinks. Drink at least 6-8 (8-ounce) glasses of fluid throughout the day.    - 4-6 PM: drink 10-ounce bottle of CLEAR Magnesium Citrate liquid (can be purchased without a prescription at the pharmacy). If you must be on low sodium diet or have kidney disease, talk to your healthcare provider before taking Magnesium Citrate. Continue clear liquids up to 3 hours prior to procedure.      Day of Procedure    - 3 hours before your procedure, do not drink anything.    - If you take medication for blood pressure, heart, seizure, or pain, please take these with just a sip of water at least 3 hours before your appointment.     - 2 hours before procedure: give yourself   1 Fleet (saline laxative) enema. If you need extra time to drive to the procedure, you may need to take the enemas earlier. Enema instructions are on the box.     - 1 hour before procedure: give yourself another 1 Fleet (saline laxative) enema.     - Remove all jewelry and body piercings before your arrival. If you use a C-PAP machine, inhaler, or Bi-Pap, please bring these with you.    If you have any questions or concerns, call the clinic by 5pm. After 5pm, call [phone] and ask to speak to GI Fellow on call           [place]            [place]  [place]             [place]               [place]            [place]          Health                 Health                  Health                   Health                 Health     [facility]          [place]      [facility]     [facility]    [facility] [facility]    [address]             GI Lab            [address] [place]           [address]     [place]        [address],            [address]                      [address]               for Health     [address]               [address]             [place], [place]      [address]  [place], [place]    [place], [place]    [place], [place]             [address]              [address]          [address]                   [id number]                   [id number]              [phone]       [address]  [phone]       [phone]     [phone]  [phone]                                    [place], [place]                                                                                                                              [address]                                                                                                                      [phone]

## 2022-08-29 NOTE — Nursing Note (Signed)
Patient is in room #14    Medication list given at the time of check in for review by patient, instructed patient to indicate to the doctor any corrections that need to be made for Medication Reconciliation.    Patient identifiers obtained. vital signs taken, screened for pain, allergies checked, patient roomed, and Tresa Moore, MD notified.     Rogue Bussing, MA

## 2022-09-27 ENCOUNTER — Encounter: Payer: Self-pay | Admitting: FAMILY PRACTICE

## 2022-09-29 MED ORDER — CLONAZEPAM 0.5 MG TABLET: 0.2500 mg | ORAL_TABLET | Freq: Two times a day (BID) | ORAL | 0 refills | Status: DC | PRN

## 2022-10-29 ENCOUNTER — Encounter: Payer: Self-pay | Admitting: FAMILY PRACTICE

## 2022-10-29 ENCOUNTER — Ambulatory Visit: Payer: Medicare PPO | Admitting: FAMILY PRACTICE

## 2022-10-29 VITALS — BP 128/71 | HR 85 | Temp 97.5°F | Resp 16 | Wt 147.9 lb

## 2022-10-29 DIAGNOSIS — K529 Noninfective gastroenteritis and colitis, unspecified: Secondary | ICD-10-CM

## 2022-10-29 DIAGNOSIS — Z23 Encounter for immunization: Secondary | ICD-10-CM

## 2022-10-29 DIAGNOSIS — F419 Anxiety disorder, unspecified: Secondary | ICD-10-CM

## 2022-10-29 MED ORDER — CLONAZEPAM 0.5 MG TABLET: 0.2500 mg | ORAL_TABLET | Freq: Two times a day (BID) | ORAL | 0 refills | Status: DC | PRN

## 2022-10-29 NOTE — Progress Notes (Signed)
Date of Service: 10/29/2022; note started 8:28 AM  ASSESSMENT & PLAN         ICD-10-CM    1. Anxiety  F41.9 Stable on current treatment regimen.  Continue.  Contact office if any problems.       2. IBD (inflammatory bowel disease)  K52.9 New diagnosis, tentative so far. Discussed implications, possible future treatment. Currently asymptomatic, no treatment indicated.       3. Encounter for administration of COVID-19 vaccine  Z23 2023-24 COVID-19 VACCINE MODERNA (69YO+)          Modified Medications    Modified Medication Previous Medication    CLONAZEPAM (KLONOPIN) 0.5 MG TABLET ClonazePAM (KLONOPIN) 0.5 mg Tablet       Take 0.5 tablets by mouth two times daily if needed (anxiety/insomnia). Indications: Generalized anxiety    Take 0.5 tablets by mouth two times daily if needed (anxiety/insomnia). Indications: Generalized anxiety        SUBJECTIVE         Amanda Lester is a 67yr female. She had concerns including Follow-up.    Primary problem is with anxiety on chronic clonazepam, here for follow up. Recent colonoscopy showing possible ulcerative proctitis. "On edge." Not currently seeing psychotherapy. Positive exercise 2x/wk, has started yoga, medication. No diarrhea, abdominal pain, blood or mucus in stool.   Also with allergy-- watery eyes, rhinorrhea. Taking Flonase x approximately 10d, no help so far. Has taken Claritin x2, no help.   Also with recurrent tension headache.     I did review patient's past medical and family/social history, no changes noted.  Additional history obtained from: None    There are no ACP documents in / surrogate the EHR. The patient was given information about how to create and file ACP documents      OBJECTIVE      Vitals:    10/29/22 0813   BP: 128/71   SITE: left arm   Orthostatic Position: sitting   Cuff Size: large   Pulse: 85   Resp: 16   Temp: 36.4 C (97.5 F)   TempSrc: Temporal   SpO2: 98%   Weight: 67.1 kg (147 lb 14.9 oz)     Body mass index is 24.62 kg/m.    Physical  Exam  Constitutional:       General: She is not in acute distress.     Appearance: She is well-developed.   Eyes:      General: No scleral icterus.  Skin:     General: Skin is warm and dry.      Findings: No rash.   Psychiatric:         Attention and Perception: Attention normal.         Mood and Affect: Mood and affect normal.         Speech: Speech normal.         Behavior: Behavior normal.         Thought Content: Thought content normal. Thought content does not include homicidal or suicidal plan.         Cognition and Memory: Memory is not impaired (Insight normal).         Judgment: Judgment normal.           Julieanne Cotton, MD  Attending Physician, Family Medicine  [facility] [employer]

## 2022-10-29 NOTE — Addendum Note (Signed)
Addended by: Verl Dicker on: 10/29/2022 11:56 AM     Modules accepted: Level of Service

## 2022-10-29 NOTE — Patient Instructions (Signed)
Zyrtec (cetirizine) is an antihistamine (allergy medication) that is available without a prescription. The standard adult doseage is one tablet once a day, and it should not cause drowsiness.  If it does not help, we may try some prescription antihistamines, but your insurance may not pay for these until you have tried the Zyrtec.      Flonase is a steroid (anti-inflammatory) nasal spray used for allergies and other nasal and sinus problems.  To maximize effectiveness, you should blow your nose prior to use, tilt your head slightly forward and aim toward the same-side eye (i.e., out from the center a little).  Aiming inward increases the risk of developing a nosebleed.  It may take a week or more to get full results from this medication. As with any medication, if you have a major side effect or other problem, please call the office or make a follow-up appointment.

## 2022-10-29 NOTE — Progress Notes (Signed)
COVID-19 Vaccine Documentation:  The patient is eligible to receive the COVID-19 Vaccine at this time:Yes    The patient received Moderna. COVID-19 Vaccine VIS given to the patient/caregiver for review. All patient/caregiver questions were answered and the patient/caregiver consents to the COVID-19 vaccine being given today.      Vaccine prepared and administered according to the Prescribing Information.       COVID-19 Vaccine Observation:  The patient was advised to wait for observation for 15 minutes for immediate reactions to the vaccine per protocol. Other: Patient declined    Kail Fraley, MA

## 2022-10-29 NOTE — Nursing Note (Signed)
ID verified by two sources: DOB and Name.  Vital signs taken, allergies verified, screened for pain, tobacco Hx verified. Pharmacy confirmed.    Standard, eye and mask precautions were followed when caring for the patient.   PPE used by provider during encounter: Surgical mask,    Derrion Tritz, MA

## 2022-11-24 ENCOUNTER — Other Ambulatory Visit: Payer: Self-pay | Admitting: NURSE PRACTITIONER

## 2022-11-24 ENCOUNTER — Encounter: Payer: Self-pay | Admitting: FAMILY PRACTICE

## 2022-11-24 MED ORDER — OLOPATADINE 0.2 % EYE DROPS
1.0000 [drp] | Freq: Every day | OPHTHALMIC | 0 refills | Status: AC
Start: 2022-11-24 — End: 2023-11-19

## 2022-11-24 NOTE — Telephone Encounter (Signed)
From: Meriel Flavors  To: Julieanne Cotton  Sent: 11/22/2022 4:33 PM PDT  Subject: Allergies    I have been taking Zyrtec and Flonase since we met on May 22. Unfortunately it is not working.my eyes are so watery that it is difficult to take my walks, or be outside at all. My eyes are so itchy that I am constantly rubbing them, and they are both red and swollen. Please advise. Is there anything else I can take for these allergies? Thank you.

## 2022-11-24 NOTE — Telephone Encounter (Signed)
Advised MD away

## 2022-11-30 ENCOUNTER — Encounter: Payer: Self-pay | Admitting: FAMILY PRACTICE

## 2022-12-01 MED ORDER — CLONAZEPAM 0.5 MG TABLET: 0.2500 mg | ORAL_TABLET | Freq: Two times a day (BID) | ORAL | 0 refills | Status: DC | PRN

## 2022-12-01 NOTE — Telephone Encounter (Signed)
Requested Prescriptions     Pending Prescriptions Disp Refills    ClonazePAM (KLONOPIN) 0.5 mg Tablet 15 tablet 0     Sig: Take 0.5 tablets by mouth two times daily if needed (anxiety/insomnia). Indications: Generalized anxiety        Last Refill: 10/29/2022  LOV: 10/29/2022   NOV: 05/01/2023

## 2022-12-02 ENCOUNTER — Ambulatory Visit: Payer: Medicare PPO | Admitting: GASTROENTEROLOGY

## 2022-12-09 ENCOUNTER — Ambulatory Visit: Payer: Medicare PPO

## 2022-12-19 ENCOUNTER — Ambulatory Visit: Payer: Medicare PPO | Admitting: GASTROENTEROLOGY

## 2022-12-26 ENCOUNTER — Ambulatory Visit: Payer: Medicare PPO | Admitting: GASTROENTEROLOGY

## 2022-12-26 DIAGNOSIS — K6289 Other specified diseases of anus and rectum: Secondary | ICD-10-CM

## 2022-12-26 NOTE — Progress Notes (Signed)
Telephone Visit Note     I performed this encounter via telephone and no video was used.     The patient verbally consented to receive this health care service by telephone.    Chief Complaint: Follow Up       Subjective:     We had the pleasure of consulting with your patient, Amanda Lester, in gastroenterology clinic today for follow up evaluation of rectosigmoid colitis seen on colonoscopy. Patient last seen by myself and Dr. Verl Bangs in March 2024. Plan at that time was to repeat sigmoidoscopy off NSAIDs as there was some thought that her proctitis could be due to NSAID induced colitis. Plan was to treat for underlying IBD if there was persistent inflammation despite cessation of NSAID use. She presents via telephone to discuss upcoming sigmoidoscopy.     Patient has questions today regarding her upcoming sigmoidoscopy as well as the possibility of IBD. She has been off NSAIDs for 4 months.     Objective:     Procedures:The following endoscopy reports were reviewed:     - Colonoscopy 08/12/2022  Inflammation in the rectosigmoid colon and rectum. 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   PATH:  A.        COLON, RECTO-SIGMOID (BIOPSY):  -   Focal acute colitis, patchy increase in chronic inflammation within the lamina propria and focal paneth cell metaplasia, see comment           B.        RECTUM (BIOPSY):  -   Mild acute proctitis with mild architectural distortion and mild increase in chronic inflammation within the lamina propria, see comment     COMMENT: The rectum and rectosigmoid colon show mild acute and chronic inflammation, with some features of chronicity like mild architectural distortion and focal paneth cell metaplasia. No viral cytopathic effect, granulomas or dysplasias are present in the given biopsy samples. The differential diagnosis for the above findings include infections,  medication induced injury and emerging inflammatory bowel disease. Correlation with clinical and endoscopic findings is suggested.     - Colonoscopy 01/21/2019  1) Four polyps resected.  2) Diverticulosis, moderate-severe in the left colon with erythema which may represent bowel preparation artifact or segmental colitis associated with diverticula  3) Hemorrhoids this is the likely etiology of hematochezia.  A.        COLON, CECUM, POLYP x 2 (4-81mm) (SNARE POLYPECTOMY):  -   Fragments of tubular adenomas           B.        COLON, CECUM, POLYP x 1 (13mm) (SNARE POLYPECTOMY):  -   Fragments of sessile serrated adenoma         C.        COLON, ASCENDING, POLYP x 1 (5mm) (SNARE POLYPECTOMY):  -   Fragments of sessile serrated adenoma         D.        COLON, SIGMOID (BIOPSY):  -   Focally active diverticular disease associated colitis (see Comment)     COMMENT: The endoscopical impression of moderate-severe diverticulosis in the left colon is noted.         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     I spent a total of 10 minutes today on this patient's  visit.     Phil Dopp, MD  Associate Professor   Division of Gastroenterology/Hepatology

## 2022-12-30 NOTE — Progress Notes (Signed)
Pre GI procedure call:  3 patient identifications obtained. Patient confirmed Sigmoidoscopy appointment on 01/06/23 at 0930, aware must have a driver secured for after procedure. Patient confirmed prep instructions were received and aware to arrive at 0900. Instructed to call if any questions or concerns at [phone] option # 3. All questions answered and verbalized understanding.     Conny Moening Cazares-Mariscal, LVN   [facility]

## 2023-01-02 ENCOUNTER — Encounter: Payer: Self-pay | Admitting: FAMILY PRACTICE

## 2023-01-02 MED ORDER — CLONAZEPAM 0.5 MG TABLET: 0.2500 mg | ORAL_TABLET | Freq: Two times a day (BID) | ORAL | 0 refills | Status: DC | PRN

## 2023-01-02 NOTE — Telephone Encounter (Signed)
Recent Visits  Date Type Provider Dept   10/29/22 Office Visit Jillene Bucks, MD [facility]   07/17/22 Office Visit Josetta Huddle, MD [facility]   07/01/22 Office Visit Josetta Huddle, MD [facility]   05/12/22 Office Visit Jillene Bucks, MD [facility]   12/26/21 Office Visit Jillene Bucks, MD [facility]   Showing recent visits within past 540 days with a meds authorizing provider and meeting all other requirements  Future Appointments  Date Type Provider Dept   05/01/23 Appointment Jillene Bucks, MD [facility]   Showing future appointments within next 150 days with a meds authorizing provider and meeting all other requirements

## 2023-01-06 ENCOUNTER — Encounter: Payer: Self-pay | Admitting: GASTROENTEROLOGY

## 2023-01-06 ENCOUNTER — Ambulatory Visit: Payer: Medicare PPO

## 2023-01-06 ENCOUNTER — Ambulatory Visit
Admission: RE | Admit: 2023-01-06 | Discharge: 2023-01-06 | Disposition: A | Discharge status: Home / Self Care | Payer: Medicare PPO | Source: Ambulatory Visit | Attending: GASTROENTEROLOGY | Admitting: GASTROENTEROLOGY

## 2023-01-06 DIAGNOSIS — K529 Noninfective gastroenteritis and colitis, unspecified: Secondary | ICD-10-CM | POA: Insufficient documentation

## 2023-01-06 DIAGNOSIS — K6289 Other specified diseases of anus and rectum: Secondary | ICD-10-CM | POA: Insufficient documentation

## 2023-01-06 HISTORY — PX: FLEXIBLE SIGMOIDOSCOPY: GILAB00006

## 2023-01-06 MED ORDER — EPINEPHRINE 0.1 MG/ML INJECTION SYRINGE
1.0000 mg | INJECTION | INTRAMUSCULAR | Status: DC | PRN
Start: 2023-01-06 — End: 2023-01-12

## 2023-01-06 MED ORDER — FENTANYL (PF) 50 MCG/ML INJECTION SOLUTION
12.5000 ug | INTRAMUSCULAR | Status: DC | PRN
Start: 2023-01-06 — End: 2023-01-12
  Administered 2023-01-06: 100 ug via INTRAVENOUS
  Filled 2023-01-06: qty 4

## 2023-01-06 MED ORDER — FLUMAZENIL 0.1 MG/ML INTRAVENOUS SOLUTION
0.2000 mg | INTRAVENOUS | Status: DC | PRN
Start: 2023-01-06 — End: 2023-01-12

## 2023-01-06 MED ORDER — NALOXONE 0.4 MG/ML INJECTION SOLUTION
0.4000 mg | INTRAMUSCULAR | Status: DC | PRN
Start: 2023-01-06 — End: 2023-01-12

## 2023-01-06 MED ORDER — ATROPINE 0.1 MG/ML INJECTION SYRINGE
0.5000 mg | INJECTION | INTRAMUSCULAR | Status: DC | PRN
Start: 2023-01-06 — End: 2023-01-12

## 2023-01-06 MED ORDER — MIDAZOLAM 1 MG/ML INJECTION SOLUTION
0.5000 mg | INTRAMUSCULAR | Status: DC | PRN
Start: 2023-01-06 — End: 2023-01-12
  Administered 2023-01-06: 4 mg via INTRAVENOUS
  Filled 2023-01-06: qty 8

## 2023-01-06 MED ORDER — EPINEPHRINE 0.1 MG/ML INJECTION SYRINGE
0.3000 mg | INJECTION | INTRAMUSCULAR | Status: DC | PRN
Start: 2023-01-06 — End: 2023-01-12

## 2023-01-06 MED ORDER — NACL 0.9% IV INFUSION
INTRAVENOUS | Status: DC
Start: 2023-01-06 — End: 2023-01-12

## 2023-01-06 MED ORDER — SIMETHICONE 40 MG/0.6 ML ORAL DROPS,SUSPENSION
40.0000 mg | Freq: Once | ORAL | Status: AC
Start: 2023-01-06 — End: 2023-01-06
  Administered 2023-01-06: 40 mg
  Filled 2023-01-06: qty 0.6

## 2023-01-06 MED ORDER — LACTATED RINGERS IV INFUSION
INTRAVENOUS | Status: DC
Start: 2023-01-06 — End: 2023-01-12

## 2023-01-06 NOTE — Discharge Instructions (Addendum)
Discharge Instructions for sigmoidoscopy    Findings:  - Mild redness in the rectum that was biopsied. Overall, appeared improved. Thus, very likely that the inflammation is from the Ibuprofen.     Activity:    Rest today, resume usual activity tomorrow     Diet:    Resume usual diet:     Medication:    Resume all usual medications    Follow up:   We will notify you of your biopsy results via Mychart, phone call or letter.If you do not receive notice of your results by three weeks, please call us at the phone number provided below.      During your procedure, air was pumped into your GI tract so your doctor could see clearly to make a diagnosis and/or treat your problem.     Some possible side effects you may experience are:   - Discomfort due to a distended (bloated) abdomen which will subside after a few hours to two days.   - Nausea may be a side effect of the medication and will subside.   - The medications you received may make you dizzy and sleepy, it is important that you do not drive, operate machinery or drink alcohol for at least one day.   - Severe pain is not expected and should be reported    Other side effects may include:  Flex. Sig. Or Colonoscopy: A small amount of diarrhea may follow the exam.    If problems, call [phone], Option 3 during business hours Monday through Friday 7:30am to 4:30 pm.  After hours call [phone] and ask to speak to the GI Fellow on call.

## 2023-01-06 NOTE — H&P (Signed)
Gastroenterology/hepatology Pre-procedure History & Physical   IMMEDIATE PRE-SEDATION ASSESSMENT    Referring provider: Dicky Doe, MD    Procedure: Sigmoidoscopy      HPI:  Amanda Lester is a 67yr old female who presents for sigmoidoscopy for proctitis.       Currently, feeling well, no acute symptoms.     Hemoglobin (g/dL)   Date Value   81/19/1478 12.5     Platelet Count   Date Value Ref Range Status   10/04/2020 216 130 - 400 K/MM3 Final     No results found for: "INR"  Creatinine Serum   Date Value Ref Range Status   07/11/2021 0.84 0.51 - 1.17 mg/dL Final       History:  Past Medical History:   Diagnosis Date    Arthritis          Current Outpatient Medications:     acetaminophen 325 mg Capsule, Take by mouth., Disp: , Rfl:     ClonazePAM (KLONOPIN) 0.5 mg Tablet, Take 0.5 tablets by mouth two times daily if needed (anxiety/insomnia). Indications: Generalized anxiety, Disp: 15 tablet, Rfl: 0    Ibuprofen (ADVIL) 200 mg Tablet, Take 1 tablet by mouth every 6 hours if needed. take with food, Disp: , Rfl:     Olopatadine (PATADAY) 0.2 % Drops, Instill 1 drop into the affected eye(s) every day., Disp: 2.5 mL, Rfl: 0    Current Facility-Administered Medications:     Atropine Injection 0.5 mg, 0.5 mg, IV, PRN X 1, Leaira Fullam, Annamarie Major, MD    Epinephrine Injection (ADRENALIN) 0.1 mg/mL 0.3-1 mg, 0.3-1 mg, SUBMUCOSAL, PRN X 1, Felicha Frayne, Annamarie Major, MD    Epinephrine Injection (ADRENALIN) 0.1 mg/mL 0.3-1 mg, 0.3-1 mg, Flush through endoscope, PRN X 1, Louvenia Golomb, Annamarie Major, MD    Epinephrine Injection (ADRENALIN) 0.1 mg/mL 1 mg, 1 mg, IV, PRN X 1, Kseniya Grunden, Annamarie Major, MD    Fentanyl (SUBLIMAZE) Injection 12.5-50 mcg, 12.5-50 mcg, IV, Q3MIN PRN, Joslyn Devon, Annamarie Major, MD    Flumazenil (ROMAZICON) Injection 0.2 mg, 0.2 mg, IV, Q1MIN PRN, Joslyn Devon, Annamarie Major, MD    Lactated Ringers Infusion, , IV, CONTINUOUS, Taisley Mordan, Annamarie Major, MD    Midazolam (VERSED) Injection 0.5-2 mg, 0.5-2 mg, IV,  Q3MIN PRN, Joslyn Devon, Annamarie Major, MD    NaCl 0.9% Infusion, , IV, CONTINUOUS, Edin Kon, Annamarie Major, MD    Naloxone [facility]) Injection 0.4 mg, 0.4 mg, IV, Q3MIN PRN, Joslyn Devon, Annamarie Major, MD    Simethicone Oral Drops (MYLICON) 40 mg/0.6 mL Suspension 40-80 mg, 40-80 mg, Flush through endoscope, ONCE, Lukisha Procida, Annamarie Major, MD    Social History:  Social History     Socioeconomic History    Marital status: MARRIED   Tobacco Use    Smoking status: Former     Current packs/day: 0.00     Average packs/day: 1 pack/day for 13.0 years (13.0 ttl pk-yrs)     Types: Cigarettes     Start date: 2001     Quit date: 2014     Years since quitting: 10.5    Smokeless tobacco: Never   Substance and Sexual Activity    Alcohol use: Not Currently     Comment: 2 a month    Drug use: Never    Sexual activity: Not Currently       The patient's past medical, family, and social history was reviewed and confirmed.    ROS: 10 point ROS was obtained and is negative except as mentioned in the HPI.  Physical exam:   General Appearance: alert, no distress, pleasant affect, cooperative.  Eyes: conjunctivae and corneas clear. sclerae normal.  Mouth: normal.  Heart: normal rate and regular rhythm.  Lungs: clear to auscultation.  Abdomen: BS normal. Abdomen soft, non-tender.  Extremities:  no edema.       Labs/imaging:   Lab Results   Lab Name Value Date/Time    WBC 4.6 10/04/2020 01:29 PM    HGB 12.5 10/04/2020 01:29 PM    HCT 35.9 (L) 10/04/2020 01:29 PM    PLT 216 10/04/2020 01:29 PM                 Impression/ plan:   Amanda Lester is a 67yr old female  who presents for sigmoidoscopy.      Pre-Sedation/Anesthesia Assessment:    Patient is a candidate for moderate sedation.   Patient is ASA status: 1 - Normal healthy patient    Airway Assessment:    Normal  ROM normal    Per pt ok to discuss bx/procedure results with family and/or leave message on voicemail.    The procedure, risks, benefits, and alternatives were explained.  All  patient questions were answered.  The informed consent was signed, and will be scanned into the computer database at a later date.     Patient barriers to learning: none     Patient/family understanding: verbalizes       Phil Dopp, MD  Associate Professor   Division of Gastroenterology/Hepatology  Pager: [phone]  PI #: [id number]

## 2023-01-06 NOTE — Nurse Focus (Signed)
Amount of time spent educating patient: 30 minutes    Education       Title: Endoscopy (Resolved)       Topic: Overview (Resolved)       Point: Indications (Resolved)       Description:   Indications for a GI endoscopy may include:       biopsy    difficulty swallowing    gastric or esophageal ulcer    GI bleeding    unexplained anemia    persistent heartburn    persistent vomiting    persistent reflux symptoms despite therapy    cancer screening    abnormal radiologic finding    foreign body removal    diagnostic for inflammatory bowel disease    Focus education on patient-specific indication.                Patient Friendly Description: There are many reasons to have this procedure.    The doctor will talk to you about why it is important for you.    Please ask questions if you do not understand.              Learning Progress Summary             Patient Acceptance, E, VU by CT at 01/06/2023 0919                         Point: Procedure Review (Resolved)       Description:   Gastrointestinal (GI) endoscopy is a procedure to examine the lining of the esophagus, stomach, upper small intestine, colon or rectum.       A flexible, lighted fiber optic scope is used.    Review anticipated anesthesia type.    Post-procedure care will include:    monitoring during sedation recovery    IV fluid    gradual diet advancement                Patient Friendly Description: A gastrointestinal (GI) endoscopy is a long, flexible tube.    It looks at the lining of parts of your digestive system.    It is put down the throat or in your bottom.    A light and a tiny camera is at the end of the scope. Other special tools can be used through a hole in the scope.    Medicine will help make you sleepy and comfortable.              Learning Progress Summary             Patient Acceptance, E, VU by CT at 01/06/2023 0919                                 Topic: Self-Management (Resolved)       Point: Activity (Resolved)       Description:   Review  post-discharge activity restrictions which may include:       driving    work/school    Someone should be with the patient to make sure he/she is safe until fully recovered from sedation.                Patient Friendly Description: Follow the doctor's instructions for activity.    Rest for today. Do not try to do too much.    There may be limits set for:    work,  school    playing sports, exercise    driving as applicable    use of tools or appliances    signing of legal papers    Someone should stay with you until you are safe or have completely recovered from sedation.              Learning Progress Summary             Patient Acceptance, E, VU by CT at 01/06/2023 0919                         Point: Fluid/Food Intake (Resolved)       Description:   Encourage advancing diet gradually once swallowing has returned to normal and patient is alert.                   Patient Friendly Description: After the procedure, your throat might be numb. When you can take small sips of water without coughing, you may start eating and drinking.              Learning Progress Summary             Patient Acceptance, E, VU by CT at 01/06/2023 0919                         Point: Resuming Home Medication (Resolved)       Description:   Resume regular medications as directed by provider after procedure.       Patient may need to hold anticoagulants, sedatives and narcotics for a specified amount of time.                Patient Friendly Description: The doctor will tell you when it is safe to take home medicines.    Depending on the procedure, you may be asked to wait to take some medicines again.    This might include:    blood thinners    pain medicine    medicines that help you relax              Learning Progress Summary             Patient Acceptance, E, VU by CT at 01/06/2023 0919                         Point: Promote Flatus Passing (Resolved)       Description:   Encourage gas expulsion and alternating positions to facilitate.                    Patient Friendly Description: Air was used to help see better during the procedure.    Extra air can give your child/you stomach pain after the procedure.    It will be important to pass gas and burp the extra air out. Walking and changing position helps move air up or down.    Right side-lying and fetal position (knees to the chest) might help with comfort.              Learning Progress Summary             Patient Acceptance, E, VU by CT at 01/06/2023 0919                                 Topic: When to  Seek Medical Attention (Resolved)       Point: Nausea and Vomiting (Resolved)       Description:   Provider (specify) should be called if:       unable to tolerate food, drink or medication    loss of appetite    symptoms do not resolve    Immediate medical care should be sought if:    forceful vomiting    blood in stool or vomit    symptoms become worse                Patient Friendly Description: Do not force yourself to eat, but try to sip some clear liquids.    Call the doctor if:    you can't keep food or drink down without getting sick    you don't have an appetite    you do not feel better    Get medical care right away if:    you see blood in your poop (stool) or vomit    you feel worse              Learning Progress Summary             Patient Acceptance, E, VU by CT at 01/06/2023 0919                         Point: Sleepiness, Persistent (Resolved)       Description:   Provider (specify) should be called if:       continued sedation effects the next day    symptoms do not resolve    Immediate medical care should be sought if:    difficult to arouse/change in level of consciousness    symptoms become worse                Patient Friendly Description: Call the doctor if:    you have a hard time waking up the next day    symptoms do not get better              Learning Progress Summary             Patient Acceptance, E, VU by CT at 01/06/2023 0919                                 Topic: Text  (Resolved)       Point: Flexible Sigmoidoscopy, Care After (Resolved)       Description:   Handout for Patient                   Patient Friendly Description: Education for You           Patient Materials:  Elsevier Document     Learning Progress Summary             Patient Acceptance, E, VU by CT at 01/06/2023 0919                         Point: Flexible Sigmoidoscopy (Resolved)       Description:   Handout for Patient                   Patient Friendly Description: Education for You           Patient Materials:  Elsevier Document     Learning Progress Summary  Patient Acceptance, E, VU by CT at 01/06/2023 0919                                         User Key       Initials Effective Dates Name Provider Type Discipline    CT 04/12/18 Remer Macho, RN .NURSE: (RN or LVN) nurse

## 2023-01-08 LAB — SURGICAL PATHOLOGY

## 2023-01-12 ENCOUNTER — Encounter: Payer: Self-pay | Admitting: GASTROENTEROLOGY

## 2023-01-12 NOTE — Telephone Encounter (Signed)
From: Meriel Flavors  To: Katy Apo  Sent: 01/08/2023 2:13 PM PDT  Subject: Biopsy results.    Thank you for the wonderful information! Two questions. Should I cancel my appointment with you on August 23rd and what is your recommendation for when I should have my next colonoscopy? I appreciate your help and guidance through this. Hardie Shackleton

## 2023-01-12 NOTE — Telephone Encounter (Signed)
Sent mychart message to patient.   Charlcie Cradle, RN  Ambulatory RN Float Pool  Please do not route to me personally, I am a float nurse  Please send all responses to [facility] Triage pool

## 2023-01-28 ENCOUNTER — Ambulatory Visit: Payer: Medicare PPO

## 2023-01-30 ENCOUNTER — Ambulatory Visit: Payer: Medicare PPO | Admitting: GASTROENTEROLOGY

## 2023-01-30 DIAGNOSIS — K6289 Other specified diseases of anus and rectum: Secondary | ICD-10-CM

## 2023-01-30 NOTE — Progress Notes (Signed)
Telephone Visit Note     I performed this encounter via telephone and no video was used.     The patient verbally consented to receive this health care service by telephone.      Chief Complaint: Follow Up     Subjective: 67 year old woman presenting over the phone for follow up regarding her recent sigmoidoscopy. She had a colonoscopy which noted acute proctitis in March 2024 thought due to NSAIDs. She was advised to hold NSAIDs and return for a sigmoidoscopy. The sigmoidoscopy showed healed mucosa and biopsies were normal.     Overall, patient doing well. No acute issues.       Objective: No distress      Assessment: 67 year old woman with:    #Proctitis: Resolved. Secondary to NSAIDs.   - Continue to monitor   - Avoid chronic NSAIDs and only use sparingly when needed.      I spent a total of 5 minutes today on this patient's visit. This included 3 minutes of time with the patient.    Phil Dopp, MD  Associate Professor   Division of Gastroenterology/Hepatology

## 2023-02-05 ENCOUNTER — Encounter: Payer: Self-pay | Admitting: FAMILY PRACTICE

## 2023-02-05 NOTE — Telephone Encounter (Signed)
 Recent Visits  Date Type Provider Dept   10/29/22 Office Visit Jillene Bucks, MD [facility]   07/17/22 Office Visit Josetta Huddle, MD [facility]   07/01/22 Office Visit Josetta Huddle, MD [facility]   05/12/22 Office Visit Jillene Bucks, MD [facility]   12/26/21 Office Visit Jillene Bucks, MD [facility]   Showing recent visits within past 540 days with a meds authorizing provider and meeting all other requirements  Future Appointments  Date Type Provider Dept   05/01/23 Appointment Jillene Bucks, MD [facility]   Showing future appointments within next 150 days with a meds authorizing provider and meeting all other requirements

## 2023-02-06 ENCOUNTER — Telehealth: Payer: Self-pay | Admitting: FAMILY PRACTICE

## 2023-02-06 MED ORDER — CLONAZEPAM 0.5 MG TABLET: 0.2500 mg | ORAL_TABLET | Freq: Two times a day (BID) | ORAL | 0 refills | Status: DC | PRN

## 2023-02-06 NOTE — Telephone Encounter (Signed)
Patient came into clinic and dropped off Advance Health Care Directive and POLST. Placed in Dr.Leavys inbasket for review.

## 2023-02-11 NOTE — Telephone Encounter (Signed)
Advance Directive sent for scanning. POLST needs to be completed at an office visit (too technical and potentially misleading for patient self-completion; the one delivered to the office was incorrectly completed). Please have the patient make an appointment.

## 2023-02-12 NOTE — Telephone Encounter (Signed)
Patient called back, MD message read verbatim. Patient will wait until her 05/01/23 appointment with MD to fill out.  No further action needed.   3 patient identifiers used.

## 2023-02-12 NOTE — Telephone Encounter (Signed)
Called [phone] and left message for patient to return call for Dr. Katrina Stack message.    Thank You,    Leonarda Salon [facility]

## 2023-02-25 ENCOUNTER — Encounter: Payer: Self-pay | Admitting: FAMILY PRACTICE

## 2023-02-25 ENCOUNTER — Ambulatory Visit: Payer: Medicare PPO | Admitting: FAMILY PRACTICE

## 2023-02-25 VITALS — BP 120/70 | HR 73 | Temp 97.3°F | Resp 20 | Wt 151.9 lb

## 2023-02-25 DIAGNOSIS — Z636 Dependent relative needing care at home: Secondary | ICD-10-CM

## 2023-02-25 DIAGNOSIS — Z7189 Other specified counseling: Secondary | ICD-10-CM

## 2023-02-25 NOTE — Progress Notes (Signed)
Date of Service: 02/25/2023  ASSESSMENT & PLAN     Assessment & Plan  POLST form  Patient wishes to discontinue POLST form. Discussed the purpose and implications of POLST form.  -Discontinue POLST form as per patient's request.    Vaccinations  Patient expressed interest in receiving flu and COVID vaccines.  -Recommend both flu and COVID vaccines once available at the clinic.    Caregiver concerns for husband with COPD  Patient expressed concerns about her husband's COPD and its progression. Discussed the typical progression of COPD and potential complications.  -Recommended establishing a backup plan for husband's care when patient is out of town.    NSAID-induced abdominal pain  Patient clarified that she does not have inflammatory bowel disease, but had gastrointestinal issues due to ibuprofen use.  -Update medical record to reflect that patient does not have inflammatory bowel disease.    General Health Maintenance  Patient reported engaging in yoga and meditation for mental health.  -Encourage continuation of yoga, meditation, exercise, and social contact for mental health maintenance.    Follow-up as needed for any questions, worries, or problems.      ICD-10-CM    1. Advance care planning  Z71.89       2. Caregiver stress  Z63.6                SUBJECTIVE   I obtained verbal consent from the patient to use AI ambient technology to transcribe the interactions between the patient and myself during the clinical encounter.    History of Present Illness  The patient, with a history of hypertension, presented to discuss several concerns. She expressed a desire to discard their incorrectly completed POLST form, which was not scanned into the system due to the errors.    The patient also discussed their husband's health, specifically his COPD and history of stroke. She expressed concern about his declining health, noting increased congestion, coughing, and reduced exertion tolerance. The patient reported that  their husband continues to smoke despite his COPD diagnosis. She sought advice on what to expect in the future and how to prepare for potential emergencies, especially when they are out of town.    Lastly, the patient clarified that they do not have inflammatory bowel disease, as previously thought, and attributed their symptoms to excessive ibuprofen use. She reported positive effects from incorporating yoga and meditation into their routine. No recent falls were reported.    I did review patient's past medical and family/social history, no changes noted.  Additional history obtained from: None    The patient /  surrogate was counseled about the importance of Advance Care Planning. In addition:  The patient/surrogate and I reviewed ACP Documents. The documents in EMR are up to date.          OBJECTIVE     Vitals:    02/25/23 0947   BP: 120/70   SITE: right arm   Orthostatic Position: sitting   Cuff Size: regular   Pulse: 73   Resp: 20   Temp: 36.3 C (97.3 F)   TempSrc: Temporal   SpO2: 99%   Weight: 68.9 kg (151 lb 14.4 oz)     Body mass index is 25.28 kg/m.    Exam as captured by ambient recording:   Physical Exam      Additional Exam (manually entered):  Physical Exam  Constitutional:       General: She is not in acute distress.     Appearance: She  is well-developed.   Eyes:      General: No scleral icterus.  Skin:     General: Skin is warm and dry.      Findings: No rash.           Julieanne Cotton, MD  Attending Physician, Family Medicine  [facility] [employer]

## 2023-02-25 NOTE — Nursing Note (Signed)
Patient verified by identifiers (name and DOB). Vital signs taken. Allergies and Pharmacy verified. Screened for pain.  Jimi Schappert, LVN

## 2023-03-05 ENCOUNTER — Ambulatory Visit: Payer: Medicare PPO

## 2023-03-12 ENCOUNTER — Encounter: Payer: Self-pay | Admitting: FAMILY PRACTICE

## 2023-03-12 NOTE — Telephone Encounter (Signed)
Requested Prescriptions     Pending Prescriptions Disp Refills    ClonazePAM (KLONOPIN) 0.5 mg Tablet 15 tablet 0     Sig: Take 0.5 tablets by mouth two times daily if needed (anxiety/insomnia). Indications: Generalized anxiety        Last Refill: 02/06/2023  LOV: 02/25/2023   NOV: Visit date not found     Willy Eddy, MA

## 2023-03-13 MED ORDER — CLONAZEPAM 0.5 MG TABLET: 0.2500 mg | ORAL_TABLET | Freq: Two times a day (BID) | ORAL | 0 refills | Status: DC | PRN

## 2023-03-24 ENCOUNTER — Ambulatory Visit: Payer: Medicare PPO

## 2023-03-24 DIAGNOSIS — Z23 Encounter for immunization: Secondary | ICD-10-CM

## 2023-03-24 NOTE — Nursing Note (Signed)
COVID-19 Vaccine Documentation:  The patient is eligible to receive the COVID-19 Vaccine at this time:Yes    COVID-19 Vaccine VIS given to the patient/caregiver for review. All patient/caregiver questions were answered and the patient/caregiver consents to the COVID-19 vaccine being given today.      Vaccine prepared and administered according to the Prescribing Information.       COVID-19 Vaccine Observation:  The patient was observed for 15 minutes for immediate reactions to the vaccine per protocol. No reaction was observed.    Doreen Salvage, LVN    The Inactivated Influenza Vaccine VIS document for the Influenza Vaccine was given to the patient/caregiver or legal representative to review. All questions were answered or referred to the physician.    The patient has no contraindications to receive the Influenza Vaccine.The Influenza Vaccine was administered per Policy 504.     The patient was observed for 15 minutes for immediate reactions to the vaccine per protocol. No reaction was observed.      Doreen Salvage, [place]

## 2023-04-13 ENCOUNTER — Encounter: Payer: Self-pay | Admitting: FAMILY PRACTICE

## 2023-04-15 MED ORDER — CLONAZEPAM 0.5 MG TABLET: 0.2500 mg | ORAL_TABLET | Freq: Two times a day (BID) | ORAL | 0 refills | Status: DC | PRN

## 2023-04-16 ENCOUNTER — Ambulatory Visit: Payer: Medicare PPO

## 2023-05-01 ENCOUNTER — Ambulatory Visit: Payer: Medicare PPO | Admitting: FAMILY PRACTICE

## 2023-05-01 ENCOUNTER — Other Ambulatory Visit: Payer: Self-pay | Admitting: FAMILY PRACTICE

## 2023-05-01 VITALS — BP 119/74 | HR 77 | Temp 98.2°F | Resp 16 | Wt 153.0 lb

## 2023-05-01 DIAGNOSIS — M25552 Pain in left hip: Secondary | ICD-10-CM

## 2023-05-01 DIAGNOSIS — Z1231 Encounter for screening mammogram for malignant neoplasm of breast: Secondary | ICD-10-CM

## 2023-05-01 DIAGNOSIS — M1812 Unilateral primary osteoarthritis of first carpometacarpal joint, left hand: Secondary | ICD-10-CM

## 2023-05-01 DIAGNOSIS — F419 Anxiety disorder, unspecified: Secondary | ICD-10-CM

## 2023-05-01 NOTE — Patient Instructions (Signed)
Our anticipated turnaround time (time until you are contacted to schedule an appointment) for a routine referral is 1-2 weeks. If you have not been contacted in that time, or if you have other questions about a referral, please call the Patient Contact Center at [phone].

## 2023-05-01 NOTE — Nursing Note (Signed)
ID verified by two sources: DOB and Name.  Vital signs taken, allergies verified, screened for pain, tobacco Hx verified. Pharmacy confirmed.    Standard, eye and mask precautions were followed when caring for the patient.   PPE used by provider during encounter: Surgical mask,    Jnai Snellgrove, MA

## 2023-05-01 NOTE — Progress Notes (Signed)
Date of Service: 05/01/2023  ASSESSMENT & PLAN     Assessment & Plan  Hip Osteoarthritis  Daily pain, impacting gait but not limiting activities. Previous referral to sports medicine with consideration of injection therapy. Patient has been managing with physical therapy, personal training, and yoga.  -Refer back to sports medicine for potential injection therapy.    Thumb Osteoarthritis  Pain with certain movements, not as severe as hip pain.  -Refer to hand therapy for management.    Anxiety  Managed with clonazepam as needed, approximately 7-8 tablets in the past two weeks. Patient is also utilizing yoga and meditation for management. Discussed potential future use of buspirone to prevent anxiety, but patient prefers to maintain current regimen at this time.  -Continue current management with clonazepam as needed.  -Provide patient with information on buspirone for potential future use.  -Schedule follow-up appointment in three months to monitor anxiety.  -Discussed risks of long-term clonazepam, especially in aging.    General Health Maintenance  -Confirmed patient is up to date with pneumococcal vaccination.  -Continue with regular mammogram screenings, next scheduled for December 2nd.      ICD-10-CM    1. Anxiety  F41.9       2. Left hip pain  M25.552 Sports Medicine Clinic Referral      3. Osteoarthritis of left thumb  M18.12 HAND THERAPY REFERRAL               SUBJECTIVE   I obtained verbal consent from the patient to use AI ambient technology to transcribe the interactions between the patient and myself during the clinical encounter.    History of Present Illness  The patient, with a history of anxiety, hip pain, and thumb pain, presents for a periodic check-in. She reports daily hip pain that has worsened over the winter. The pain is particularly severe upon waking and initial movement, and she believes it is impacting her gait. Despite the pain, she continues to engage in her usual activities,  including personal training twice a week and yoga three times a week. She had previously been referred to sports medicine for the hip pain and had considered an injection, but declined at the time as the pain was manageable.    The patient also reports pain in her thumb, which has been diagnosed as osteoarthritic changes. The pain is not as severe as the hip pain, but it does interfere with her ability to grasp objects.    Regarding her anxiety, the patient reports that it is currently manageable. She has been using clonazepam as needed, approximately seven or eight times in the past two weeks. She has been participating in yoga and meditation, which she finds beneficial for her mental health. However, she expresses concern about the upcoming changes in her yoga practice due to her instructor moving to private practice. She also mentions that the recent election has been a source of stress.          I did review patient's past medical and family/social history, no changes noted.  Additional history obtained from: None      OBJECTIVE     Vitals:    05/01/23 0915   BP: 119/74   SITE: right arm   Orthostatic Position: sitting   Cuff Size: regular   Pulse: 77   Resp: 16   Temp: 36.8 C (98.2 F)   TempSrc: Temporal   SpO2: 96%   Weight: 69.4 kg (153 lb)     Body mass index  is 25.46 kg/m.    Exam as captured by ambient recording:   Physical Exam      Additional Exam (manually entered through NoteWriter):  Physical Exam  Constitutional:       General: She is not in acute distress.     Appearance: She is well-developed.   Eyes:      General: No scleral icterus.  Skin:     General: Skin is warm and dry.      Findings: No rash.   Psychiatric:         Attention and Perception: Attention normal.         Mood and Affect: Mood is anxious.         Speech: Speech normal.         Behavior: Behavior normal.         Thought Content: Thought content normal. Thought content does not include homicidal or suicidal plan.          Cognition and Memory: Memory is not impaired (Insight normal).         Judgment: Judgment normal.           Julieanne Cotton, MD  Attending Physician, Family Medicine  [facility]

## 2023-05-11 ENCOUNTER — Ambulatory Visit: Payer: Medicare PPO

## 2023-05-20 ENCOUNTER — Encounter: Payer: Self-pay | Admitting: FAMILY PRACTICE

## 2023-05-20 MED ORDER — CLONAZEPAM 0.5 MG TABLET: 0.2500 mg | ORAL_TABLET | Freq: Two times a day (BID) | ORAL | 0 refills | Status: DC | PRN

## 2023-06-12 ENCOUNTER — Ambulatory Visit: Payer: Medicare PPO

## 2023-06-17 ENCOUNTER — Telehealth: Payer: Self-pay | Admitting: FAMILY PRACTICE

## 2023-06-17 NOTE — Telephone Encounter (Signed)
Received: Occupational Therapy Physician Splint Order  From: [facility]    Date: 06/16/2023    Please sign, date and send back to: [facility]; F#: [phone].    Inetta Fermo, [place]

## 2023-06-17 NOTE — Telephone Encounter (Signed)
Received: MD Treatment Plan Approval - Signature Required    From: [facility]  Date: 06/16/2023    MD Treatment Plan Approval- Signature Required.    Dropped OFF: MD's Inbox.    Inetta Fermo, [place]

## 2023-06-22 ENCOUNTER — Encounter: Payer: Self-pay | Admitting: FAMILY PRACTICE

## 2023-06-23 MED ORDER — CLONAZEPAM 0.5 MG TABLET: 0.2500 mg | ORAL_TABLET | Freq: Two times a day (BID) | ORAL | 0 refills | Status: DC | PRN

## 2023-06-23 NOTE — Telephone Encounter (Signed)
 Requested Prescriptions     Pending Prescriptions Disp Refills    ClonazePAM (KLONOPIN) 0.5 mg Tablet 15 tablet 0     Sig: Take 0.5 tablets by mouth two times daily if needed (anxiety/insomnia). Indications: Generalized anxiety        Last Refill: 05/20/2023  LOV: 02/25/2023   NOV: Visit date not found     Amanda Harold, MA

## 2023-07-08 ENCOUNTER — Ambulatory Visit: Payer: Medicare PPO | Admitting: Sports Medicine

## 2023-07-27 ENCOUNTER — Encounter: Payer: Self-pay | Admitting: FAMILY PRACTICE

## 2023-07-28 MED ORDER — CLONAZEPAM 0.5 MG TABLET: 0.2500 mg | ORAL_TABLET | Freq: Two times a day (BID) | ORAL | 0 refills | Status: DC | PRN

## 2023-07-28 NOTE — Telephone Encounter (Signed)
Requested Prescriptions     Pending Prescriptions Disp Refills    ClonazePAM (KLONOPIN) 0.5 mg Tablet 15 tablet 0     Sig: Take 0.5 tablets by mouth two times daily if needed (anxiety/insomnia). Indications: Generalized anxiety        Last Refill: 06/23/2023  LOV: 05/01/2023  NOV: 07/29/2023     Willy Eddy, MA

## 2023-07-29 ENCOUNTER — Ambulatory Visit: Payer: Medicare PPO | Admitting: FAMILY PRACTICE

## 2023-07-29 VITALS — BP 117/72 | HR 84 | Temp 97.6°F | Ht 65.0 in | Wt 152.3 lb

## 2023-07-29 DIAGNOSIS — F419 Anxiety disorder, unspecified: Secondary | ICD-10-CM

## 2023-07-29 DIAGNOSIS — N959 Unspecified menopausal and perimenopausal disorder: Secondary | ICD-10-CM

## 2023-07-29 MED ORDER — VENLAFAXINE ER 37.5 MG CAPSULE,EXTENDED RELEASE 24 HR
37.5000 mg | EXTENDED_RELEASE_CAPSULE | Freq: Every day | ORAL | 11 refills | Status: DC
Start: 2023-07-29 — End: 2023-10-09

## 2023-07-29 NOTE — Progress Notes (Signed)
Date of Service: 07/29/2023  ASSESSMENT & PLAN     Assessment & Plan  Anxiety  Chronic anxiety with variable control. Currently managed with yoga, meditation, and personal healing sessions. Patient has a history of adverse reactions to sertraline and escitalopram. Clonazepam use is minimal but concerns about long-term use and withdrawal were discussed.  -Discontinue Clonazepam.  -Start Venlafaxine 37.5mg  daily for anxiety and hot flashes.  -Follow-up in 6 weeks to assess response and side effects.    Menopausal Symptoms  Persistent hot flashes post-menopause.  -Start Venlafaxine 37.5mg  daily for hot flashes.    General Health Maintenance  -Order bone density scan to assess for osteoporosis.  -Follow-up appointment in 6 weeks.      ICD-10-CM    1. Anxiety  F41.9       2. Menopausal and postmenopausal disorder  N95.9 DEXA, COMPLETE               SUBJECTIVE   I obtained verbal consent from the patient to use AI ambient technology to transcribe the interactions between the patient and myself during the clinical encounter.    History of Present Illness  Amanda Lester is a 68 year old female who presents with anxiety management and medication concerns.    She experiences anxiety that fluctuates, significantly influenced by stress related to her husband's health condition. Her husband has stage three or four COPD, and a recent PET scan revealed a small spot on his lung, which is being monitored. His recent breathing test showed a decline to 41% lung function, increasing her stress levels.    She engages in yoga two to three times a week and exercises at home every morning and night. She also sees a Systems analyst twice a week. Previously, she attended therapy but found it unhelpful. Currently, she visits a personal healer on Mondays for discussions and meditation, and practices yoga on Thursdays. She experiences tension in her neck and head, often resulting in headaches, especially after stressful events, which  she attempts to manage with exercises, massage, and relaxation techniques.    She is currently on a low dose of clonazepam, which she takes as needed. She is concerned about the long-term effects of clonazepam, especially given her age, and is cautious about trying new medications due to past adverse reactions, such as insomnia with sertraline and excessive sweating with Lexapro.          I did review patient's past medical and family/social history, no changes noted.  Additional history obtained from: None      OBJECTIVE     Vitals:    07/29/23 0807   BP: 117/72   SITE: right arm   Orthostatic Position: sitting   Cuff Size: regular   Pulse: 84   Temp: 36.4 C (97.6 F)   TempSrc: Temporal   SpO2: 99%   Weight: 69.1 kg (152 lb 5.4 oz)   Height: 1.651 m (5\' 5" )     Body mass index is 25.35 kg/m.    Exam as captured by ambient recording:   Physical Exam      Additional Exam (manually entered through NoteWriter):  Physical Exam  Constitutional:       General: She is not in acute distress.     Appearance: She is well-developed.   Eyes:      General: No scleral icterus.  Skin:     General: Skin is warm and dry.      Findings: No rash.   Psychiatric:  Attention and Perception: Attention normal.         Mood and Affect: Mood is anxious. Inappropriate: Tense affect.         Speech: Speech normal.         Behavior: Behavior normal.         Thought Content: Thought content normal. Thought content does not include homicidal or suicidal plan.         Cognition and Memory: Memory is not impaired (Insight normal).         Judgment: Judgment normal.           Julieanne Cotton, MD  Attending Physician, Family Medicine  [facility]

## 2023-07-29 NOTE — Nursing Note (Signed)
 Two identifiers used to verify patient. Vital signs taken, blood pressure taken, allergies verified, screened for pain, pharmacy verified.Amanda Lester.LVN

## 2023-07-29 NOTE — Patient Instructions (Signed)
 Radiology Scheduling [phone]

## 2023-09-09 ENCOUNTER — Ambulatory Visit: Payer: Medicare PPO

## 2023-09-11 ENCOUNTER — Ambulatory Visit: Payer: Medicare PPO | Admitting: FAMILY PRACTICE

## 2023-10-09 ENCOUNTER — Encounter: Payer: Self-pay | Admitting: FAMILY PRACTICE

## 2023-10-09 ENCOUNTER — Ambulatory Visit: Admitting: FAMILY PRACTICE

## 2023-10-09 VITALS — BP 134/71 | HR 74 | Temp 97.9°F | Resp 16 | Wt 152.8 lb

## 2023-10-09 DIAGNOSIS — Z23 Encounter for immunization: Secondary | ICD-10-CM

## 2023-10-09 DIAGNOSIS — F419 Anxiety disorder, unspecified: Secondary | ICD-10-CM

## 2023-10-09 MED ORDER — CLONAZEPAM 0.5 MG TABLET: 0.2500 mg | ORAL_TABLET | Freq: Two times a day (BID) | ORAL | 0 refills | Status: DC | PRN

## 2023-10-09 NOTE — Nursing Note (Signed)
 COVID-19 Vaccine Documentation:  The patient is eligible to receive the COVID-19 Vaccine at this time:Yes    COVID-19 Vaccine VIS given to the patient/caregiver for review. All patient/caregiver questions were answered and the patient/caregiver consents to the COVID-19 vaccine being given today.      Vaccine prepared and administered according to the Prescribing Information.       COVID-19 Vaccine Observation:  The patient was observed for 15 minutes for immediate reactions to the vaccine per protocol. No reaction was observed.    Ivery Quale, LVN

## 2023-10-09 NOTE — Progress Notes (Signed)
 Date of Service: 10/09/2023  ASSESSMENT & PLAN     Assessment & Plan  Chronic anxiety  Chronic anxiety with nocturnal awakening and stress-induced episodes. Venlafaxine  discontinued due to headaches. Continuing yoga, meditation, exercise. Therapy suggested for stress management related to husband's health.  - Prescribe clonazepam  for as-needed use.  - Encourage therapy, possibly via Zoom.  - Follow up in six months or sooner if symptoms worsen.  - Consider reframing attitude toward husband's smoking-- the added lifespan from stopping now may be worth less than the frustration of arguing with him about it.     General Health Maintenance  Discussed COVID booster and potential side effects.  - Administer COVID booster.      ICD-10-CM    1. Anxiety  F41.9           Modified Medications    Modified Medication Previous Medication    CLONAZEPAM  (KLONOPIN ) 0.5 MG TABLET ClonazePAM  (KLONOPIN ) 0.5 mg Tablet       Take 0.5 tablets by mouth two times daily if needed (anxiety/insomnia). Indications: Generalized anxiety    Take 0.5 tablets by mouth two times daily if needed (anxiety/insomnia). Indications: Generalized anxiety        SUBJECTIVE     I obtained verbal consent from the patient or the patient's representative and any other participants to use AI ambient technology to transcribe the interactions between the patient and myself during the clinical encounter. I read and reviewed the draft note.    History of Present Illness  Amanda Lester is a 68 year old female with anxiety who presents with anxiety management concerns.    She is not currently taking venlafaxine , which was previously prescribed for her anxiety, due to experiencing severe headaches. She has discontinued its use and is not taking any other medication for anxiety at this time. She has clonazepam  available for use on an as-needed basis but has not used it recently and is currently out of it.    Her anxiety symptoms manifest primarily as waking up at 2  or 3 in the morning, feeling 'off beat' and anxious, particularly when stressors are present, such as concerns about her son or her partner, [name].     She engages in self-care activities to manage her anxiety, including yoga, meditation, and working out three times a week. She does not currently see a therapist.    Her partner, [name], has been losing weight and has a decreased appetite, which contributes to her anxiety. She is trying to encourage him to eat healthily despite his lack of appetite. Bob's health issues, including his lung condition and smoking habits, are a source of stress for her.          I did review patient's past medical and family/social history, no changes noted.  Additional history obtained from: None        OBJECTIVE     Vitals:    10/09/23 0952   BP: 134/71   SITE: right arm   Orthostatic Position: sitting   Cuff Size: regular   Pulse: 74   Resp: 16   Temp: 36.6 C (97.9 F)   TempSrc: Temporal   SpO2: 96%   Weight: 69.3 kg (152 lb 12.5 oz)     Body mass index is 25.42 kg/m.    Exam as captured by ambient recording:   Physical Exam      Additional Exam (manually entered through NoteWriter):  Physical Exam  Constitutional:       General: She  is not in acute distress.     Appearance: She is well-developed.   Eyes:      General: No scleral icterus.  Skin:     General: Skin is warm and dry.      Findings: No rash.   Psychiatric:         Attention and Perception: Attention normal.         Mood and Affect: Mood and affect normal.         Speech: Speech normal.         Behavior: Behavior normal.         Thought Content: Thought content normal. Thought content does not include homicidal or suicidal plan.         Cognition and Memory: Memory is not impaired (Insight normal).         Judgment: Judgment normal.           Rozella Cornfield, MD  Attending Physician, Family Medicine  [facility] [employer] [facility]

## 2023-10-09 NOTE — Nursing Note (Signed)
 ID verified by two sources: DOB and Name.  Vital signs taken, allergies verified, screened for pain, tobacco Hx verified. Pharmacy confirmed.    Standard, eye and mask precautions were followed when caring for the patient.   PPE used by provider during encounter: Surgical mask,    Willy Eddy, MA

## 2023-10-20 ENCOUNTER — Ambulatory Visit: Payer: Medicare PPO

## 2023-10-23 ENCOUNTER — Ambulatory Visit

## 2023-11-25 ENCOUNTER — Ambulatory Visit (INDEPENDENT_AMBULATORY_CARE_PROVIDER_SITE_OTHER): Admitting: FAMILY PRACTICE

## 2023-11-25 DIAGNOSIS — Z539 Procedure and treatment not carried out, unspecified reason: Secondary | ICD-10-CM

## 2023-11-25 NOTE — Progress Notes (Signed)
 Patient left without being seen.

## 2023-12-03 ENCOUNTER — Encounter: Payer: Self-pay | Admitting: FAMILY PRACTICE

## 2023-12-03 ENCOUNTER — Ambulatory Visit: Admitting: FAMILY PRACTICE

## 2023-12-03 VITALS — BP 126/73 | HR 76 | Temp 97.7°F | Wt 149.7 lb

## 2023-12-03 DIAGNOSIS — F419 Anxiety disorder, unspecified: Secondary | ICD-10-CM

## 2023-12-03 DIAGNOSIS — E782 Mixed hyperlipidemia: Secondary | ICD-10-CM

## 2023-12-03 DIAGNOSIS — M25552 Pain in left hip: Secondary | ICD-10-CM

## 2023-12-03 MED ORDER — CLONAZEPAM 0.5 MG TABLET: 0.2500 mg | ORAL_TABLET | Freq: Two times a day (BID) | ORAL | 0 refills | Status: DC | PRN

## 2023-12-03 NOTE — Progress Notes (Addendum)
 Date of Service: 12/03/2023  ASSESSMENT & PLAN     Assessment & Plan  Hip Osteoarthritis  Chronic hip pain due to osteoarthritis affecting daily activities. Prefers less invasive treatments over steroid injections due to concerns about side effects.  - Refer to physical therapy for hip exercises.  - Postpone cortisone injection with Dr. Alexa to assess physical therapy effectiveness.  - Continue using acetaminophen for pain management.    Anxiety  Anxiety well-managed with intermittent clonazepam .  - Refill clonazepam  prescription.    General Health Maintenance  Discussed need for DEXA scan and routine blood work. She is updating personal records for end-of-life planning.  - Schedule DEXA scan for bone density.  - Order routine blood work for November.  - Provide radiology scheduling phone number for DEXA scan appointment.    Goals of Care  Updating 'death binder' with personal and financial information. Advanced directives completed.    Follow-up  Maintain current appointment schedule. Advised to monitor insurance coverage situation with [employer] and [employer].  - Keep the November appointment for anxiety follow-up.  - Monitor insurance coverage situation with [employer] and [employer].      ICD-10-CM    1. Pain of left hip  M25.552 Physical Therapy Referral      2. Mixed hyperlipidemia  E78.2 CBC No Differential     Comprehensive Metabolic Panel     Lipid Panel with DLDL Reflex      3. Anxiety  F41.9           Modified Medications    Modified Medication Previous Medication    CLONAZEPAM  (KLONOPIN ) 0.5 MG TABLET ClonazePAM  (KLONOPIN ) 0.5 mg Tablet       Take 0.5 tablets by mouth two times daily if needed (anxiety/insomnia). Indications: Generalized anxiety    Take 0.5 tablets by mouth two times daily if needed (anxiety/insomnia). Indications: Generalized anxiety        SUBJECTIVE     I obtained verbal consent from the patient or the patient's representative and any other participants to use AI ambient  technology to transcribe the interactions between the patient and myself during the clinical encounter. I read and reviewed the draft note.          History of Present Illness  Amanda Lester is a 68 year old female with hip arthritis who presents with worsening hip pain.    Hip pain and functional impairment  - Worsening pain in the hip, sometimes severe  - Pain impacts daily activities, particularly walking  - Altered gait due to pain  - Stiffness in the left hip  - No recent falls    Associated joint discomfort  - Discomfort in the left knee and right hip    Pain management and prior interventions  - Takes Tylenol almost daily for pain control  - Physical therapy completed about 1.5 years ago with benefit in symptom management and avoidance of cortisone injection at that time  - Has forgotten most physical therapy exercises but has resumed the exercises she can remember  - Sports Med appointment scheduled for July 8th, but hesitant due to concerns about injection and its effects  - Previously postponed cortisone injection over a year ago when pain was less severe    Anxiety symptoms  - Takes clonazepam  for anxiety, which is manageable          I did review patient's past medical and family/social history, no changes noted.  Additional history obtained from: None  Fall risks Assessment:   No falls in past year. No throw rugs, poorly lighted areas, missing rails/grab bars in the home. No medications or conditions that affect stability or mobility.      OBJECTIVE     Vitals:    12/03/23 0801   BP: 126/73   SITE: right arm   Orthostatic Position: sitting   Cuff Size: regular   Pulse: 76   Temp: 36.5 C (97.7 F)   TempSrc: Temporal   SpO2: 97%   Weight: 67.9 kg (149 lb 11.1 oz)     Body mass index is 24.91 kg/m.    Exam as captured by ambient recording:   Physical Exam  NEUROLOGIC: Normal reflexes patellar and achilles. No focal weakness or tremor. No abnormal movements. Strength normal bilateral lower  extremities.  MUSCULOSKELETAL: No tenderness to palpation back or hips. Right hip mild stiffness. Left hip moderate stiffness, with duplication of pain of complaint on internal and external rotation. Right knee crepitus. Left knee crepitus.    Additional Exam (manually entered through NoteWriter):  Physical Exam  Constitutional:       General: She is not in acute distress.     Appearance: She is well-developed.   Eyes:      General: No scleral icterus.  Skin:     General: Skin is warm and dry.      Findings: No rash.   Psychiatric:         Attention and Perception: Attention normal.         Mood and Affect: Mood and affect normal.         Speech: Speech normal.         Behavior: Behavior normal.         Thought Content: Thought content normal. Thought content does not include homicidal or suicidal plan.         Cognition and Memory: Memory is not impaired (Insight normal).         Judgment: Judgment normal.           Morene FABIENE Push, MD  Attending Physician, Family Medicine  [facility] [employer]

## 2023-12-03 NOTE — Patient Instructions (Signed)
 Radiology Scheduling [phone]

## 2023-12-03 NOTE — Nursing Note (Signed)
 Amanda Lester has been identified by name, and date of birth.  Amanda Lester is here with Patient     Patient's chief complaint noted.     I have verified and updated the following:  Allergy   Tobacco   Preferred pharmacy  PHQ-2/PHQ9       Health Maintenance(Care Gaps) reviewed with patient     Patient is not due for A1C  - Complete POC A1C in clinic if due.  If patient is going to lab TODAY, okay to decline POC. Document reason if patient declines.      Patient is not due for Chlamydia Screen - Pend order from HM tab and collect 1st catch urine if due. If patient is here for a urinary symptom, please do not collect GC/Chlamydia sample as a clean catch sample is more appropriate for care.  Document if patient declined UA sample with the reason.      Patient is not due for Retinopathy Screen - Schedule or complete exam today, document if declined.  If completed, please request medical record and complete HM with date of completion per patient.    Patient is not due for Flu vaccine. Offer vaccine during rooming, document if declined.  If patient states, they have already received a flu vaccine from an outside organization document date and location received.     Vital signs documented below.    Andrea Cassis, LVN    Temp: 36.5 C (97.7 F) (06/26 0801)  Temp src: Temporal (06/26 0801)  Pulse: 76 (06/26 0801)  BP: 126/73 (06/26 0801)  Resp: --  SpO2: 97 % (06/26 0801)  Height: --  Weight: 67.9 kg (149 lb 11.1 oz) (06/26 0801)

## 2023-12-15 ENCOUNTER — Ambulatory Visit: Admitting: Sports Medicine

## 2024-01-11 ENCOUNTER — Encounter: Payer: Self-pay | Admitting: FAMILY PRACTICE

## 2024-01-11 MED ORDER — CLONAZEPAM 0.5 MG TABLET: 0.2500 mg | ORAL_TABLET | Freq: Two times a day (BID) | ORAL | 0 refills | Status: DC | PRN

## 2024-01-11 NOTE — Telephone Encounter (Signed)
 Requested Prescriptions     Pending Prescriptions Disp Refills    ClonazePAM  (KLONOPIN ) 0.5 mg Tablet 15 tablet 0     Sig: Take 0.5 tablets by mouth two times daily if needed (anxiety/insomnia). Indications: Generalized anxiety        Last Refill: 12/03/2023  LOV: 12/03/2023   NOV: 04/11/2024     Delene Mcburney, MA

## 2024-01-19 ENCOUNTER — Ambulatory Visit

## 2024-02-21 ENCOUNTER — Encounter: Payer: Self-pay | Admitting: NURSE PRACTITIONER

## 2024-02-22 MED ORDER — CLONAZEPAM 0.5 MG TABLET: 0.2500 mg | ORAL_TABLET | Freq: Two times a day (BID) | ORAL | 0 refills | Status: DC | PRN

## 2024-03-24 ENCOUNTER — Ambulatory Visit

## 2024-03-24 DIAGNOSIS — Z23 Encounter for immunization: Secondary | ICD-10-CM

## 2024-03-24 NOTE — Nursing Note (Signed)
The Inactivated Influenza Vaccine VIS document for the Influenza Vaccine was given to the patient/caregiver or legal representative to review. All questions were answered or referred to the physician.    The patient has no contraindications to receive the Influenza Vaccine.The Influenza Vaccine was administered per Policy 504.     The patient was observed for 15 minutes for immediate reactions to the vaccine per protocol. No reaction was observed.      COVID-19 Vaccine Documentation:  The patient is eligible to receive the COVID-19 Vaccine at this time:Yes    COVID-19 Vaccine VIS given to the patient/caregiver for review. All patient/caregiver questions were answered and the patient/caregiver consents to the COVID-19 vaccine being given today.      Vaccine prepared and administered according to the Prescribing Information.       COVID-19 Vaccine Observation:  The patient was observed for 15 minutes for immediate reactions to the vaccine per protocol. No reaction was observed.    [facility] [place], [place]

## 2024-03-29 ENCOUNTER — Ambulatory Visit

## 2024-04-08 ENCOUNTER — Ambulatory Visit: Attending: FAMILY PRACTICE

## 2024-04-08 DIAGNOSIS — E782 Mixed hyperlipidemia: Secondary | ICD-10-CM | POA: Insufficient documentation

## 2024-04-08 LAB — COMPREHENSIVE METABOLIC PANEL
Alanine Transferase (ALT): 38 U/L — ABNORMAL HIGH (ref <=33)
Albumin: 4.1 g/dL (ref 4.0–4.9)
Alkaline Phosphatase (ALP): 124 U/L (ref 35–129)
Anion Gap: 10 mmol/L (ref 7–15)
Aspartate Transaminase (AST): 27 U/L (ref <=41)
Bilirubin Total: 0.2 mg/dL (ref <=1.2)
Calcium: 9.3 mg/dL (ref 8.6–10.0)
Carbon Dioxide Total: 28 mmol/L (ref 22–29)
Chloride: 102 mmol/L (ref 98–107)
Creatinine Serum: 0.92 mg/dL (ref 0.51–1.17)
E-GFR Creatinine (Female): 67 mL/min/1.73m*2
Glucose: 115 mg/dL — ABNORMAL HIGH (ref 74–109)
Potassium: 4.1 mmol/L (ref 3.4–5.1)
Protein: 7.2 g/dL (ref 6.6–8.7)
Sodium: 140 mmol/L (ref 136–145)
Urea Nitrogen, Blood (BUN): 24 mg/dL — ABNORMAL HIGH (ref 6–20)

## 2024-04-08 LAB — LIPID PANEL WITH DLDL REFLEX
Cholesterol: 275 mg/dL — ABNORMAL HIGH (ref <200)
HDL Cholesterol: 62 mg/dL (ref >40)
Non-HDL Cholesterol: 213 mg/dL — ABNORMAL HIGH (ref <=150)
Total Cholesterol: HDL Ratio: 4.4 — ABNORMAL HIGH (ref <4.0)
Triglyceride Level: 222 mg/dL — ABNORMAL HIGH (ref <150)

## 2024-04-08 LAB — CBC NO DIFFERENTIAL
Hematocrit: 37.4 % (ref 36.0–46.0)
Hemoglobin: 12.8 g/dL (ref 12.0–16.0)
MCH: 31 pg (ref 27.0–33.0)
MCHC: 34.2 % (ref 32.0–36.0)
MCV: 90.7 fL (ref 80.0–100.0)
MPV: 8.6 fL (ref 6.8–10.0)
Platelet Count: 234 K/MM3 (ref 130–400)
RDW: 12.9 % (ref 0.0–14.7)
Red Blood Cell Count: 4.12 M/MM3 (ref 4.00–5.20)
White Blood Cell Count: 5.1 K/MM3 (ref 4.5–11.0)

## 2024-04-08 LAB — LDL CHOLESTEROL (DIRECT): LDL Cholesterol (Direct): 190 mg/dL — ABNORMAL HIGH (ref <100)

## 2024-04-11 ENCOUNTER — Encounter: Payer: Self-pay | Admitting: FAMILY PRACTICE

## 2024-04-11 ENCOUNTER — Ambulatory Visit: Admitting: FAMILY PRACTICE

## 2024-04-11 ENCOUNTER — Ambulatory Visit: Payer: Self-pay | Admitting: FAMILY PRACTICE

## 2024-04-11 VITALS — BP 134/80 | HR 101 | Temp 98.1°F | Wt 155.2 lb

## 2024-04-11 DIAGNOSIS — R739 Hyperglycemia, unspecified: Secondary | ICD-10-CM

## 2024-04-11 DIAGNOSIS — E663 Overweight: Secondary | ICD-10-CM

## 2024-04-11 DIAGNOSIS — F419 Anxiety disorder, unspecified: Secondary | ICD-10-CM

## 2024-04-11 DIAGNOSIS — E782 Mixed hyperlipidemia: Secondary | ICD-10-CM

## 2024-04-11 DIAGNOSIS — Z6825 Body mass index (BMI) 25.0-25.9, adult: Secondary | ICD-10-CM

## 2024-04-11 MED ORDER — ATORVASTATIN 40 MG TABLET: 40.0000 mg | ORAL_TABLET | Freq: Every day | ORAL | 12 refills | Status: AC

## 2024-04-11 MED ORDER — BUSPIRONE 5 MG TABLET: 5.0000 mg | ORAL_TABLET | Freq: Three times a day (TID) | ORAL | 11 refills | Status: AC

## 2024-04-11 MED ORDER — CLONAZEPAM 0.5 MG TABLET: 0.2500 mg | ORAL_TABLET | Freq: Two times a day (BID) | ORAL | 0 refills | Status: DC | PRN

## 2024-04-11 NOTE — Progress Notes (Signed)
 Date of Service: 04/11/2024  ASSESSMENT & PLAN     Assessment & Plan  Mixed hyperlipidemia  Elevated cholesterol with high LDL. High intermediate cardiovascular risk, even if we do not believe LDL is actually over 190. Discussed genetic factors and cholesterol management. Explained statin side effects and acetaminophen limits.  - Prescribe statin to lower LDL.  - Provide statin education handouts.  - Order liver function tests in one month.    Overweight  BMI indicates overweight. Weight gain noted. Discussed energy balance and non-pharmacological weight management strategies. Reviewed weight loss medication side effects.  - Refer to [facility] weight management program.  - Enroll in Achieving a Healthy Weight class.    Hyperglycemia  Slightly elevated blood sugar, monitor required.  - Order hemoglobin A1c test at next blood draw.    Anxiety disorder  Managed with clonazepam . Discussed memory concerns and transition to medication with better side effect profile. Explained clonazepam 's potential memory effects.  - Prescribe BuSpar twice daily, option to increase to three times daily.  - Continue clonazepam  as needed, plan to reduce over time.  - Schedule follow-up in two months to assess BuSpar response.      ICD-10-CM    1. Mixed hyperlipidemia  E78.2 Comprehensive Metabolic Panel      2. Overweight  E66.3 Dietary Referral     Achieving a Health Weight     [facility]      3. Hyperglycemia  R73.9 Hemoglobin A1C      4. Anxiety  F41.9           Modified Medications    Modified Medication Previous Medication    CLONAZEPAM  (KLONOPIN ) 0.5 MG TABLET clonazePAM  (KlonoPIN ) 0.5 mg tablet       Take 0.5 tablets by mouth two times daily if needed (anxiety/insomnia). Indications: Generalized anxiety    Take 0.5 tablets by mouth two times daily if needed (anxiety/insomnia). Indications: Generalized anxiety        SUBJECTIVE     I obtained verbal consent from the patient to use AI ambient technology to  transcribe the interactions between the patient and myself during the clinical encounter.      AI Scribe Consent Status Change: Opts into AI Scribe for all areas         History of Present Illness  Amanda Lester is a 68 year old female with hyperlipidemia and anxiety who presents for evaluation of high cholesterol and anxiety management.    Hyperlipidemia  - Total cholesterol 275 mg/dL  - HDL 62 mg/dL  - LDL 809 mg/dL (previous LDL in 7976 was 133 mg/dL)  - Cholesterol levels have fluctuated over the years  - Concern about implications of high cholesterol  - Attempts at dietary management: limited red meat and cheese intake, consumes yogurt and cottage cheese  - Considering consultation with a dietitian for further guidance    Weight gain  - Weight increased by 5 pounds over the last five months and 15 pounds over the past year  - Current weight 155 pounds  - Attribution of weight gain to nighttime eating while sedentary  - Difficulty modifying eating habits  - Continues physical activity: yoga and personal training twice weekly  - Recent decrease in walking due to a cold  - Exploring options for weight management    Anxiety symptoms  - Anxiety exacerbated by son's upcoming surgery  - Clonazepam  use two to four times per week for symptom management  - Concern about  long-term effects of clonazepam  on memory, particularly with aging  - Previous trials of other anxiety medications resulted in adverse effects (insomnia, vivid dreams) leading to discontinuation  - Interest in alternative anxiety management options after son's surgery    Elevated blood glucose  - Recent increase in blood sugar levels attributed to increased candy consumption around Halloween          I did review patient's past medical and family/social history, no changes noted.  Additional history obtained from: None    The patient/surrogate and I reviewed the Advance Directive / POLST. The documents in EMR are up to date.         OBJECTIVE      Vitals:    04/11/24 0851   BP: 134/80   SITE: left arm   Orthostatic Position: sitting   Cuff Size: regular   Pulse: 101   Temp: 36.7 C (98.1 F)   TempSrc: Temporal   SpO2: 99%   Weight: 70.4 kg (155 lb 3.3 oz)     Body mass index is 25.83 kg/m.    Exam as captured by ambient recording:   Physical Exam  VITALS: BP- 134/80  MEASUREMENTS: Weight- 155, BMI- 25.4.  HEART: Normal heart sounds, regular rhythm. No murmurs appreciated.  LUNGS: Clear to auscultation bilaterally, no wheezes, rales, or ronchi.    Additional Exam (manually entered through NoteWriter):  Physical Exam  Constitutional:       General: She is not in acute distress.     Appearance: She is well-developed.   Eyes:      General: No scleral icterus.  Skin:     General: Skin is warm and dry.      Findings: No rash.   Psychiatric:         Attention and Perception: Attention normal.         Mood and Affect: Mood and affect normal.         Speech: Speech normal.         Behavior: Behavior normal.         Thought Content: Thought content normal. Thought content does not include homicidal or suicidal plan.         Cognition and Memory: Memory is not impaired (Insight normal).         Judgment: Judgment normal.       Total time I spent in care of this patient today (excluding time spent on other billable services) was 41 minutes.     Morene FABIENE Push, MD  Attending Physician, Family Medicine  [facility] [employer]

## 2024-04-11 NOTE — Nursing Note (Signed)
 Vitals signs were taken, screened for pain, pharmacy and allergies verified.        Ivery Quale, LVN

## 2024-05-10 ENCOUNTER — Ambulatory Visit

## 2024-05-11 ENCOUNTER — Ambulatory Visit: Payer: Self-pay | Admitting: FAMILY PRACTICE

## 2024-05-11 ENCOUNTER — Ambulatory Visit: Attending: FAMILY PRACTICE

## 2024-05-11 DIAGNOSIS — E782 Mixed hyperlipidemia: Secondary | ICD-10-CM

## 2024-05-11 DIAGNOSIS — R739 Hyperglycemia, unspecified: Secondary | ICD-10-CM

## 2024-05-11 LAB — COMPREHENSIVE METABOLIC PANEL
Alanine Transferase (ALT): 22 U/L (ref <=33)
Albumin: 4.2 g/dL (ref 4.0–4.9)
Alkaline Phosphatase (ALP): 104 U/L (ref 35–129)
Anion Gap: 11 mmol/L (ref 7–15)
Aspartate Transaminase (AST): 21 U/L (ref <=41)
Bilirubin Total: 0.4 mg/dL (ref <=1.2)
Calcium: 9.5 mg/dL (ref 8.6–10.0)
Carbon Dioxide Total: 26 mmol/L (ref 22–29)
Chloride: 103 mmol/L (ref 98–107)
Creatinine Serum: 0.83 mg/dL (ref 0.51–1.17)
E-GFR Creatinine (Female): 76 mL/min/1.73m*2
Glucose: 99 mg/dL (ref 74–109)
Potassium: 4.3 mmol/L (ref 3.4–5.1)
Protein: 7.2 g/dL (ref 6.6–8.7)
Sodium: 140 mmol/L (ref 136–145)
Urea Nitrogen, Blood (BUN): 14 mg/dL (ref 6–20)

## 2024-05-11 LAB — HEMOGLOBIN A1C
Hgb A1C,Glucose Est Avg: 114 mg/dL
Hgb A1C: 5.6 % (ref 3.9–5.6)

## 2024-05-24 ENCOUNTER — Encounter: Payer: Self-pay | Admitting: FAMILY PRACTICE

## 2024-05-24 DIAGNOSIS — M1610 Unilateral primary osteoarthritis, unspecified hip: Secondary | ICD-10-CM

## 2024-05-24 MED ORDER — CLONAZEPAM 0.5 MG TABLET: 0.2500 mg | ORAL_TABLET | Freq: Two times a day (BID) | ORAL | 0 refills | Status: DC | PRN

## 2024-05-24 NOTE — Telephone Encounter (Signed)
 Requested Prescriptions     Pending Prescriptions Disp Refills    clonazePAM  (KlonoPIN ) 0.5 mg tablet 15 tablet 0     Sig: Take 0.5 tablets by mouth two times daily if needed (anxiety/insomnia). Indications: Generalized anxiety        Last Refill: 04/11/2024  LOV: 04/11/2024   NOV: 06/13/2024     Nat Gareth Jubilee, [place]

## 2024-05-24 NOTE — Telephone Encounter (Signed)
 Forwarding to Provider to review and respond to patient.   Referral Request:  Patient is writing to request new Orthopedics/Sports Medicine referral for L hip steroid injection. Patient requests Dr. Alexa; patient's insurance is Payor: [employer] / Plan: [employer] PPO.

## 2024-05-27 ENCOUNTER — Encounter: Payer: Self-pay | Admitting: FAMILY PRACTICE

## 2024-05-27 DIAGNOSIS — M25552 Pain in left hip: Secondary | ICD-10-CM

## 2024-05-27 NOTE — Telephone Encounter (Signed)
 Forwarding to Provider to review and respond to patient.  Note to Provider: This MyChart message may be appropriate to be converted into an E-Visit encounter because it involves: Evaluation/Management of CHRONIC or PRIOR condition (for flare-up, change, follow-up, etc), NOT already addressed by this provider in past 7 days    Arthritis left hip, X ray request

## 2024-05-30 ENCOUNTER — Other Ambulatory Visit: Payer: Self-pay | Admitting: FAMILY PRACTICE

## 2024-05-30 DIAGNOSIS — Z1231 Encounter for screening mammogram for malignant neoplasm of breast: Secondary | ICD-10-CM

## 2024-06-13 ENCOUNTER — Ambulatory Visit: Admitting: FAMILY PRACTICE

## 2024-06-13 ENCOUNTER — Encounter: Payer: Self-pay | Admitting: FAMILY PRACTICE

## 2024-06-13 VITALS — BP 135/77 | HR 83 | Temp 97.9°F | Wt 157.8 lb

## 2024-06-13 DIAGNOSIS — F419 Anxiety disorder, unspecified: Secondary | ICD-10-CM

## 2024-06-13 DIAGNOSIS — E782 Mixed hyperlipidemia: Secondary | ICD-10-CM

## 2024-06-13 DIAGNOSIS — E663 Overweight: Secondary | ICD-10-CM

## 2024-06-13 NOTE — Progress Notes (Signed)
 Date of Service: 06/13/2024  ASSESSMENT & PLAN     Assessment & Plan  Anxiety disorder  Well-managed with Buspar , clonazepam  use reduced, now 2-3x/wk. Goal to discontinue clonazepam  due to long-term risks.  - Increased Buspar  to three times daily.  - Trial discontinue clonazepam .    Mixed hyperlipidemia  LDL cholesterol at 190 mg/dL, statin therapy necessary for cardiovascular risk reduction.  - Continue statin therapy.    Overweight  BMI 26, weight management hindered by hip pain. Hormonal and thyroid  issues unlikely.  - Referred to integrative medicine for dietary management.  - Ordered thyroid  function tests with next blood draw.  - Encouraged participation in [place] weight management classes.      ICD-10-CM    1. Anxiety  F41.9 TSH with Free T4 Reflex     Primary Care Integrative Medicine Consult      2. Mixed hyperlipidemia  E78.2 Lipid Panel with DLDL Reflex     Comprehensive Metabolic Panel     Primary Care Integrative Medicine Consult      3. Overweight  E66.3 Primary Care Integrative Medicine Consult               SUBJECTIVE      The patient has previously given verbal consent to use AI ambient technology to transcribe the interactions between the patient and myself during the clinical encounter.               History of Present Illness  Amanda Lester is a 69 year old female who presents for management of her anxiety medication.    Anxiety management  - Currently taking Buspar  twice daily for anxiety, recently initiated.  - Buspar  is effective compared to previous medications.  - Experienced bad dreams on the first night of Buspar , but has since adjusted.  - Considering increasing Buspar  to three times daily.  - Clonazepam  use has decreased to a couple of times per week.    Hyperlipidemia  - History of elevated LDL cholesterol, previously measured at 190.  - Concerned about long-term implications of high cholesterol.  - Wishes to avoid prolonged statin therapy if possible.    Weight management and  body image  - Struggling with weight and dissatisfied with current body image.  - Previously achieved weight loss through walking.  - Unable to walk as much currently due to hip pain secondary to arthritis.    Hip pain secondary to arthritis  - Experiencing hip pain attributed to arthritis.  - Limited ambulation due to pain.  - Scheduled for a cortisone injection for hip pain.    Preventive health and screening  - No chest pain or shortness of breath.  - Scheduled for a mammogram in one and a half weeks.          I did review patient's past medical and family/social history, no changes noted.  Additional history obtained from: None        OBJECTIVE     Vitals:    06/13/24 0827   BP: 135/77   SITE: left arm   Orthostatic Position: sitting   Cuff Size: regular   Pulse: 83   Temp: 36.6 C (97.9 F)   TempSrc: Temporal   SpO2: 97%   Weight: 71.6 kg (157 lb 13.6 oz)     Body mass index is 26.27 kg/m.    Exam as captured by ambient recording:   Physical Exam  MEASUREMENTS: BMI- 26.0.  HEART: Normal rate, regular rhythm. No murmurs appreciated.  LUNGS: Clear to auscultation bilaterally, no wheezes, rales, or ronchi.    Additional Exam (manually entered through NoteWriter):  Physical Exam  Constitutional:       General: She is not in acute distress.     Appearance: She is well-developed.   Eyes:      General: No scleral icterus.  Skin:     General: Skin is warm and dry.      Findings: No rash.           Morene FABIENE Push, MD  Attending Physician, Family Medicine  [facility] [employer] [facility] [employer]

## 2024-06-13 NOTE — Nursing Note (Signed)
 Amanda Lester has been identified by name, and date of birth.  Amanda Lester is here with Patient     Patient's chief complaint noted.     I have verified and updated the following:  Allergy   Tobacco   Preferred pharmacy  PHQ-2/PHQ9       Health Maintenance(Care Gaps) reviewed with patient     Patient is not due for Advanced Care Planing - Place ACP outside exam room door with blue sheet if due. Provider will have the conversation.       Patient is not due for A1C  - Complete POC A1C in clinic if due.  If patient is going to lab TODAY, okay to decline POC. Document reason if patient declines.      Patient is not due for Repeat Blood Pressure Check - Recheck BP using a manual cuff after 5 minutes. Document updated BP in EPIC under vitals.    Patient is not due for Colorectal Cancer Screen - Inform provider that CRC is due and offer to pend FIT kit/ cologuard or if referral to Gastroenterology for Colonoscopy if appropriate.     Patient is not due for Lung Cancer Screen - Inform provider that lung cancer screening is due.     Vital signs documented below.    Andrea Cassis, LVN    Temp: 36.6 C (97.9 F) (01/05 0827)  Temp src: Temporal (01/05 0827)  Pulse: 83 (01/05 0827)  BP: 135/77 (01/05 0827)  Resp: --  SpO2: 97 % (01/05 0827)  Height: --  Weight: 71.6 kg (157 lb 13.6 oz) (01/05 0827)

## 2024-06-15 ENCOUNTER — Ambulatory Visit

## 2024-06-21 ENCOUNTER — Ambulatory Visit: Payer: Self-pay | Admitting: FAMILY PRACTICE

## 2024-06-21 NOTE — Telephone Encounter (Signed)
 3 patient identifiers used.  Per:   patient.    Disposition: SEE PCP WITHIN 24 HOURS   Advice given per protocol  Appointment given per protocol     Date & Time  06/22/2024 10:40 AM Provider  Roxan Morene Metro, MD Department  [facility]     Per:   patient verbalizes agreement to plan. Agrees to callback with any increase in symptoms/concerns or questions.    See Assessment Below    C/O vertigo. Patient had a massage yesterday, reports dizziness when standing up after massage.  Pt completed Epley maneuver, dizziness improved. Pt started exercise, improved. Pt started busPIRone  (Buspar ) 5 mg tablet and   atorvastatin  (Lipitor) 40 mg tablet started in November. No head or ear injury. Pt with intermittent clicking to right ear for a few months. Dizziness reported when going from laying down to sitting, lasts a few seconds and resolves with rest. No weakness or numbness. Speech clear. No vision changes. From laying down to standing, resolves with rest in a few seconds. Appointment scheduled with PCP. ER precautions reinforced.    See Care Advice Below  Care Advice            Dizziness - Bobby JINNY Maria, RN Tue Jun 21, 2024 08:17 AM      Care Advice       CALL BACK IF:  * Severe headache occurs  * Weakness develops in an arm or leg  * Unable to walk without falling  * You become worse                            See Protocol and Disposition Below    Reason for Disposition   [1] MODERATE dizziness (e.g., vertigo; feels very unsteady, interferes with normal activities) AND [2] has NOT been evaluated by doctor (or NP/PA) for this    Additional Information   Negative: [1] Weakness (i.e., paralysis, loss of muscle strength) of the face, arm or leg on one side of the body AND [2] sudden onset AND [3] present now   Negative: [1] Numbness (i.e., loss of sensation) of the face, arm or leg on one side of the body AND [2] sudden onset AND [3] present now   Negative: [1] Loss of speech or  garbled speech AND [2] sudden onset AND [3] present now   Negative: Difficult to awaken or acting confused (e.g., disoriented, slurred speech)   Negative: Sounds like a life-threatening emergency to the triager   Negative: SEVERE dizziness (vertigo) (e.g., unable to walk without assistance)   Negative: SEVERE headache (e.g., excruciating)  (Exception: Similar to previous migraines.)   Negative: [1] Weakness (i.e., paralysis, loss of muscle strength) of the face, arm / hand, or leg / foot on one side of the body AND [2] sudden onset AND [3] brief (now gone)   Negative: [1] Numbness (i.e., loss of sensation) of the face, arm / hand, or leg / foot on one side of the body AND [2] sudden onset AND [3] brief (now gone)   Negative: [1] Loss of speech or garbled speech AND [2] sudden onset AND [3] brief (now gone)   Negative: Loss of vision or double vision  (Exception: Similar to previous migraines.)   Negative: [1] Dizziness (vertigo) present now AND [2] one or more STROKE RISK FACTORS (i.e., hypertension, diabetes, prior stroke/TIA, heart attack)  (Exception: Prior doctor or NP/PA evaluation for this AND no different/worse  than usual.)   Negative: [1] Dizziness (vertigo) present now AND [2] age > 58  (Exception: Prior doctor or NP/PA evaluation for this AND no different/worse than usual.)   Negative: Patient sounds very sick or weak to the triager   Negative: Taking a medicine that could cause dizziness (e.g., phenytoin [Dilantin], carbamazepine [Tegretol], primidone [Mysoline])    Protocols used: Dizziness - Vertigo-Adult-AH    Delon LABOR, RN, BSN  [facility]

## 2024-06-21 NOTE — Telephone Encounter (Signed)
 Emergency Advice / Priscille Loveless:    Reason for Call:  Patient experiencing symptoms of dizziness.  BATLINE CALL transferred to Nursing Triage back line.    Gari Crown  Patient Services Rep II  Patient Contact Center

## 2024-06-22 ENCOUNTER — Ambulatory Visit: Admitting: FAMILY PRACTICE

## 2024-06-22 ENCOUNTER — Ambulatory Visit

## 2024-06-22 ENCOUNTER — Encounter: Payer: Self-pay | Admitting: FAMILY PRACTICE

## 2024-06-22 ENCOUNTER — Ambulatory Visit: Attending: FAMILY PRACTICE

## 2024-06-22 ENCOUNTER — Ambulatory Visit: Payer: Self-pay | Admitting: FAMILY PRACTICE

## 2024-06-22 VITALS — BP 138/65 | HR 91 | Temp 98.2°F | Ht 65.0 in | Wt 158.3 lb

## 2024-06-22 DIAGNOSIS — H6593 Unspecified nonsuppurative otitis media, bilateral: Secondary | ICD-10-CM

## 2024-06-22 DIAGNOSIS — H9193 Unspecified hearing loss, bilateral: Secondary | ICD-10-CM

## 2024-06-22 DIAGNOSIS — H8112 Benign paroxysmal vertigo, left ear: Secondary | ICD-10-CM

## 2024-06-22 DIAGNOSIS — E782 Mixed hyperlipidemia: Secondary | ICD-10-CM | POA: Insufficient documentation

## 2024-06-22 DIAGNOSIS — F419 Anxiety disorder, unspecified: Secondary | ICD-10-CM

## 2024-06-22 DIAGNOSIS — R7989 Other specified abnormal findings of blood chemistry: Secondary | ICD-10-CM

## 2024-06-22 LAB — HEARING, SCREENING TEST
POC LT EAR 1 kHZ: 30
POC LT EAR 2 kHZ: 50
POC LT EAR 3 kHZ: 50
POC LT EAR 4 kHZ: 55
POC LT EAR 6 kHZ: 50
POC LT EAR 8 kHZ: 50
POC RT EAR 1 kHZ: 30
POC RT EAR 2 kHZ: 40
POC RT EAR 3 kHZ: 45
POC RT EAR 4 kHZ: 55
POC RT EAR 6 kHZ: 45
POC RT EAR 8 kHZ: 50

## 2024-06-22 LAB — TSH WITH FREE T4 REFLEX: Thyroid Stimulating Hormone: 1.44 u[IU]/mL (ref 0.27–4.20)

## 2024-06-22 LAB — LIPID PANEL WITH DLDL REFLEX
Cholesterol: 162 mg/dL (ref <200)
HDL Cholesterol: 85 mg/dL (ref >40)
LDL Cholesterol Calculation: 59 mg/dL (ref <100)
Non-HDL Cholesterol: 77 mg/dL (ref <=150)
Total Cholesterol: HDL Ratio: 1.9 (ref <4.0)
Triglyceride Level: 90 mg/dL (ref <150)

## 2024-06-22 LAB — COMPREHENSIVE METABOLIC PANEL
Alanine Transferase (ALT): 71 U/L — ABNORMAL HIGH (ref <=33)
Albumin: 4.3 g/dL (ref 4.0–4.9)
Alkaline Phosphatase (ALP): 183 U/L — ABNORMAL HIGH (ref 35–129)
Anion Gap: 11 mmol/L (ref 7–15)
Aspartate Transaminase (AST): 42 U/L — ABNORMAL HIGH (ref <=41)
Bilirubin Total: 0.5 mg/dL (ref <=1.2)
Calcium: 9.3 mg/dL (ref 8.6–10.0)
Carbon Dioxide Total: 25 mmol/L (ref 22–29)
Chloride: 102 mmol/L (ref 98–107)
Creatinine Serum: 0.84 mg/dL (ref 0.51–1.17)
E-GFR Creatinine (Female): 75 mL/min/1.73m*2
Glucose: 119 mg/dL — ABNORMAL HIGH (ref 74–109)
Potassium: 3.9 mmol/L (ref 3.4–5.1)
Protein: 7.2 g/dL (ref 6.6–8.7)
Sodium: 138 mmol/L (ref 136–145)
Urea Nitrogen, Blood (BUN): 8 mg/dL (ref 6–20)

## 2024-06-22 MED ORDER — CLONAZEPAM 0.5 MG TABLET: 0.2500 mg | ORAL_TABLET | Freq: Two times a day (BID) | ORAL | 0 refills | Status: AC | PRN

## 2024-06-22 MED ORDER — MECLIZINE 25 MG TABLET: 25.0000 mg | ORAL_TABLET | Freq: Three times a day (TID) | ORAL | 0 refills | Status: AC | PRN

## 2024-06-22 NOTE — Progress Notes (Signed)
 Date of Service: 06/22/2024  ASSESSMENT & PLAN     Assessment & Plan  Benign paroxysmal positional vertigo  Confirmed by positive Dix-Hallpike maneuver. Condition is benign and self-limiting.  - Prescribed meclizine  for nausea.  - Provided educational handout.  - Consider return for Epley if worsening.    Age-related hearing loss  Decreased hearing bilaterally, worse at high pitches, consistent with age-related hearing loss. Affects word discrimination, leading to difficulty in understanding speech.  - Referred to audiology for evaluation and potential hearing aid fitting.    Middle ear effusion  Fluid behind the eardrum, likely due to allergies or cold. No signs of infection. Condition is benign and self-limiting.  - Continue to monitor fluid status.    Anxiety disorder  Anxiety managed with Buspar  and clonazepam . Clonazepam  used for situational anxiety, possibly as a placebo effect.    Mixed hyperlipidemia  Managed with Lipitor. No indication that Lipitor is contributing to vertigo symptoms.  - Continue current Lipitor regimen.      ICD-10-CM    1. Benign paroxysmal positional vertigo of left ear  H81.12       2. Decreased hearing of both ears  H91.93 Hearing, Screening Test     Adult ENT Clinic Referral      3. Fluid level behind tympanic membrane of both ears  H65.93       4. Anxiety  F41.9           Modified Medications    Modified Medication Previous Medication    CLONAZEPAM  (KLONOPIN ) 0.5 MG TABLET clonazePAM  (KlonoPIN ) 0.5 mg tablet       Take 0.5 tablets by mouth two times daily if needed (anxiety/insomnia). Indications: Generalized anxiety    Take 0.5 tablets by mouth two times daily if needed (anxiety/insomnia). Indications: Generalized anxiety        SUBJECTIVE     The patient has previously given verbal consent to use AI ambient technology to transcribe the interactions between the patient and myself during the clinical encounter.               History of Present Illness  Amanda Lester is a 69  year old female who presents with episodes of dizziness and vertigo.    Vertigo and dizziness  - Onset approximately one week before Thanksgiving, shortly after starting Lipitor on November 3rd  - Initial episode occurred after working out and lying down, with dizziness triggered by movement from sitting to lying down   - Second episode occurred after a massage, described as 'real woozy' with a spinning sensation similar to being on a roller coaster; episode lasted only seconds  - No recurrence of vertigo since initial episodes, but occasional sensation of being on the verge of vertigo during exercises  - No associated nausea or vomiting  - No chest pain, shortness of breath, or palpitations  - Inadequate water intake at time of initial episode; has since increased water intake and performed exercises to prevent recurrence    Otologic symptoms  - No ear pain  - No changes in hearing, but mild hearing loss present and previously tested  - Right ear feels itchy and plugged, with a crackling sound  - No tinnitus    Allergic symptoms  - History of allergies  - Current symptoms include sneezing and stuffy nose  - Uses Zyrtec and Flonase for allergy management    Medication history and concerns  - Started Lipitor on November 3rd, prior to onset of dizziness and vertigo  -  Dizziness occurred before starting Buspar   - Questions possible relationship between symptoms and current medications (Lipitor, Buspar )            Additional history obtained from: None        OBJECTIVE     Vitals:    06/22/24 1049 06/22/24 1052   BP: (!) 142/67 138/65   SITE: right arm right arm   Orthostatic Position: sitting sitting   Cuff Size: regular    Pulse: 95 91   Temp: 36.8 C (98.2 F)    TempSrc: Tympanic    SpO2: 99%    Weight: 71.8 kg (158 lb 4.6 oz)    Height: 1.651 m (5' 5)      Body mass index is 26.34 kg/m.    Exam as captured by ambient recording:   Physical Exam  EARS: External auditory canals clear, no cerumen bilaterally. Middle  ear effusion bilateral, without erythema.  MOUTH AND THROAT: Pharynx normal.  NECK: Neck supple.  NEUROLOGIC: Extraocular movements intact. Cranial nerves grossly intact. No focal weakness. Heel to shin intact bilaterally.    Additional Exam (manually entered through NoteWriter):  Physical Exam  Constitutional:       General: She is not in acute distress.     Appearance: She is well-developed.   Eyes:      General: No scleral icterus.  Skin:     General: Skin is warm and dry.      Findings: No rash.   Neurological:      Comments: Positive Dix-Hallpike to left only.       Total time I spent in care of this patient today (excluding time spent on other billable services) was 35 minutes.     Morene FABIENE Push, MD  Attending Physician, Family Medicine  [facility] [employer] [employer]

## 2024-06-22 NOTE — Patient Instructions (Signed)
 Our anticipated turnaround time (time until you are contacted to schedule an appointment) for a routine referral is 1-2 weeks. If you have not been contacted in that time, or if you have other questions about a referral, please call the Patient Contact Center at [phone].

## 2024-06-22 NOTE — Progress Notes (Deleted)
 Completed POC hearing test ordered by Dr. Roxan.      Nichole Carry, LVN

## 2024-06-22 NOTE — Nursing Note (Addendum)
 Amanda Lester has been identified by name, and date of birth.  Amanda Lester is here with self     Patient's chief complaint noted.     I have verified and updated the following:  Allergy   Tobacco   Preferred pharmacy  PHQ-2/PHQ9       Health Maintenance(Care Gaps) reviewed with patient     Patient is not due for Advanced Care Planing - Place ACP outside exam room door with blue sheet if due. Provider will have the conversation.       Patient is not due for A1C  - Complete POC A1C in clinic if due.  If patient is going to lab TODAY, okay to decline POC. Document reason if patient declines.      Patient is due for Repeat Blood Pressure Check - Recheck BP using a manual cuff after 5 minutes. Document updated BP in EPIC under vitals.    Patient is not due for Colorectal Cancer Screen - Inform provider that CRC is due and offer to pend FIT kit/ cologuard or if referral to Gastroenterology for Colonoscopy if appropriate.     Patient is not due for Lung Cancer Screen - Inform provider that lung cancer screening is due.     Vital signs documented below.    Amanda Lester, LVN    Temp: 36.8 C (98.2 F) (01/14 1049)  Temp src: Tympanic (01/14 1049)  Pulse: 95 (01/14 1049)  BP: 142/67 (01/14 1049)  Resp: --  SpO2: 99 % (01/14 1049)  Height: 165.1 cm (5' 5) (01/14 1049)  Weight: 71.8 kg (158 lb 4.6 oz) (01/14 1049)

## 2024-06-22 NOTE — Nursing Note (Signed)
 Completed POC Hearing test ordered by Dr. Roxan.      Nichole Carry, LVN

## 2024-06-28 NOTE — Progress Notes (Unsigned)
 ADULT NUTRITION ASSESSMENT  Date of Service:  06/29/2024, 10:18       Verified name, DOB, MRN and/or address at initiation of appointment.    Interpreter used? No    Reason For Assessment:   Referral: by Roxan Morene Metro, MD      Nutrition Assessment     Amanda Lester is a 68yr old female here for anxiety, HLD, overweight    Nutrition Pertinent Medical History:  serrated adenoma of colon, diverticulosis of colon, anxiety    Patient/Other reports: Continues to have hormonal symptoms - hot flashes, night sweats, headaches, brain fog. Has also gained weight, mainly around her midsection. Elevated cholesterol, started on statins but are currently on hold due to elevated liver enzymes.     Social History:   Lives with her husband    Food and Nutrition Related History:   N/A  Food allergies/intolerances: NFKA, cinnamon (heartburn), limits spicy foods due to heartburn     Diet Recall:   Eating out: 2-3x/month   Water/Fluids: 32-64 oz daily  Beverages: green tea/honey, alkaline water  Alcohol intake: none     Breakfast: green tea with honey; 1 piece wheat bread OR yogurt with grapes, nuts/seeds OR cottage cheese with honey/grapes OR oatmeal (instant steel cut), fruit (grapes or berries), nuts, honey  Snack: none  Lunch: none  Dinner: 3-4PM [employer] protein meals (ready made meal) - protein/veggie/starch OR fish/chicken, veggies, starch OR soups   Snack: 6-7 PM dessert - 2 cookies, berries, strawberry shortcake      Physical Activity: walking 2-3x/week for 3-4 miles; workouts 2x/week with a personal trainer, yoga 2x/week (hatha yoga with some flow)    Pertinent Labs/Studies:    Latest Reference Range & Units 04/08/24 14:50 06/22/24 10:39   Cholesterol <200 mg/dL 724 (H) 837   Triglyceride <150 mg/dL 777 (H) 90   LDL Cholesterol (Direct) <100 mg/dL 809 (H)    LDL Cholesterol Calculation <100 mg/dL NOTE: 59   HDL Cholesterol >40 mg/dL 62 85   Non-HDL Cholesterol <=150 mg/dL 786 (H) 77   Total Cholesterol:HDL Ratio  <4.0  4.4 (H) 1.9   (H): Data is abnormally high     Latest Reference Range & Units 04/08/24 14:50 05/11/24 09:34 06/22/24 10:39   Alkaline Phosphatase (ALP) 35 - 129 U/L 124 104 183 (H)   Aspartate Transaminase (AST) <=41 U/L 27 21 42 (H)   Alanine Transferase (ALT) <=33 U/L 38 (H) 22 71 (H)   (H): Data is abnormally high      Pertinent Medications: buspirone , clonazepam , meclizine , previously on atorvastatin  (held x1 week due to liver enzymes)    Micronutrient Supplements:   Calcium   Centrum 50+ mvi/mins  Magnesium - Neuro Mag (Magnesium L-threonate - 144mg )  Glucosamine  B6/B12 combo  Collagen Peptides - takes intermittently  Benefiber - takes intermittently    Nutrition Focused Physical Exam: Not indicated    Additional Nutrition Findings:  Potential signs of inflammation: N/A  Digestive Systems: BM regular - indigestion, tends towards looser stools with higher stress    Anthropometrics:   72.2 kg (159 lb 2.8 oz) 1.651 m (5' 5) 26.49 kg/m    Wt Readings from Last 5 Encounters:   06/29/24 72.2 kg (159 lb 2.8 oz)   06/22/24 71.8 kg (158 lb 4.6 oz)   06/13/24 71.6 kg (157 lb 13.6 oz)   04/11/24 70.4 kg (155 lb 3.3 oz)   12/03/23 67.9 kg (149 lb 11.1 oz)  Height: 65 inches  IBW (BMI 25): 150 lbs (68.1 kg)  Usual Body Weight: 150-155 lbs  Recent Wt Change: +10 lbs x7 months    Estimated Nutrition Needs: Not discussed today, focused on eating behaviors      Estimated Nutrition Intake: unable to quantify based on general diet history    Nutrition Diagnosis     No diagnosis of malnutrition at this time.      Altered nutrition related laboratory values related to metabolic, genetic, and environmental factors as evidenced by chol 275H, TG 222 H, LDL 190H.  - Status: new      Nutrition Intervention (Recommendations)     Nutrition counseling  - For hot flashes   - Increase phytoestrogen intake    - Soy, ground flax, lentils and other beans are more concentrated sources     - other foods containing phytoestrogens are  winter squash, green beans, collard greens, broccoli, cabbage, alfalfa sprouts, prunes, peaches, raspberries, strawberries, wheat, rye, oats, barley, sesame seeds, pistachios, sunflower seeds, almonds.    GLENWOOD Stanley, from [employer], is a self guided hypnotherapy program to help manage night sweats, insomnia, and hot flashes    - 5 week program    - free trial then paid subscription    - [email or web address].eviamenopause.com/ for more information  - Can add magnesium glycinate 200-300 mg before bed   -  Aim for 3 meals, 1-2 snacks per day   - Can use the plate method to help you build balanced meals   -1/2 vegetables, 1/4 protein (choose leaner types), 1/4 carbohydrates (choose higher fiber options)   - usually 1-3 cups veggies, 3-4 oz protein or ~25g equivalent , 1-1.5 cups cooked carbohydrate  - Can consider a probiotic    - Bio-Gaia gastrus    - [employer]akult   - For general microbiome health and support:   - aim for 30 or more different plant foods each week. Includes fruits, vegetables, nuts/seeds, beans/legumes, and whole grains   - include fermented foods daily like sauerkraut, kimchi, other fermented vegetables, yogurt, and kefir (fermented yogurt drink)    Nutrition education  - Topics Reviewed: Anti-Inflammatory eating  - Prioritize a wide variety of vegetables and fruits, aim for 6 or more 1/2 cup servings daily (1 cup for leafy greens)  - Aim for 25+ grams fiber (women) or 38+ grams fiber (men) per day  - Limit refined carbohydrates in favor of whole grains and starchy vegetables.  - Choose more plant-based sources of protein  - Choose more wild caught fatty fish like salmon and tuna   - Limit red meat and limit/avoid processed meats  - Choose more omega-3 rich foods  - Choose extra virgin olive oil most often   - Limit intake of saturated fats and avoid trans fats   - Limit sugar intake, and avoid sugary beverages    Patient verbalizes understanding of teaching instructions: yes  Handouts Provided: Anti-Inflammatory  Eating, Fiber Food Sources   Barriers to learning assessed: none    Nutrition Monitoring & Evaluation (Goals)     Weight: gradual weight loss  Types of food: minimally processed foods rich in fiber, lean & plant based protein; limited intake of refined sugars/starches, saturated and trans fats      Time Spent: RD Time Tracking     Report Electronically Signed By: Lamarr Hazard, MS, RD  Available via Secure Chat

## 2024-06-29 ENCOUNTER — Ambulatory Visit: Admitting: Registered"

## 2024-06-29 NOTE — Patient Instructions (Addendum)
 Nutrition Recommendations  - For hot flashes   - Increase phytoestrogen intake    - Soy, ground flax, lentils and other beans are more concentrated sources     - other foods containing phytoestrogens are winter squash, green beans, collard greens, broccoli, cabbage, alfalfa sprouts, prunes, peaches, raspberries, strawberries, wheat, rye, oats, barley, sesame seeds, pistachios, sunflower seeds, almonds.    GLENWOOD Stanley, from [employer] [facility], is a self guided hypnotherapy program to help manage night sweats, insomnia, and hot flashes    - 5 week program    - free trial then paid subscription    - [email or web address].eviamenopause.com/ for more information  - Can add magnesium glycinate 200-300 mg before bed   -  Aim for 3 meals, 1-2 snacks per day   - Can use the plate method to help you build balanced meals   -1/2 vegetables, 1/4 protein (choose leaner types), 1/4 carbohydrates (choose higher fiber options)   - usually 1-3 cups veggies, 3-4 oz protein or ~25g equivalent , 1-1.5 cups cooked carbohydrate  - Can consider a probiotic    - Bio-Gaia gastrus    - Yakult   - For general microbiome health and support:   - aim for 30 or more different plant foods each week. Includes fruits, vegetables, nuts/seeds, beans/legumes, and whole grains   - include fermented foods daily like sauerkraut, kimchi, other fermented vegetables, yogurt, and kefir (fermented yogurt drink)    General tips for anti-inflammatory eating  - Prioritize a wide variety of vegetables and fruits, aim for 6 or more 1/2 cup servings daily (1 cup for leafy greens)  - Limit refined carbohydrates (crackers, breads, pastas, pretzels, rolls, etc) in favor of more whole grains and starchy vegetables. Keep serving size to about 1/4 of your plate.   - Choose more plant-based sources of protein like whole soy products (tofu, tempeh, edamame, soy milk), peas, beans, lentils, nuts, and seeds.   - Limit how much red meat you eat.  - Limit or avoid processed meats (bacon, salami,  deli meats, sausages, hot dogs, etc.)  - Choose more omega-3 rich foods like wild caught fatty fish, flax, chia, and hemp seeds, and walnuts   - SMASH fish - salmon, mackerel, anchovies, sardines, herring   - Plant based sources - flax, chia, hemp seeds, walnuts    - Omega-3 enriched eggs are available  - Choose extra virgin olive oil most often   - Limit intake of saturated fats like butter, fatty meats, processed meats, high fat dairy, and palm oil  - Avoid trans fats present in partially-hydrogenated oils, deep fried foods, chips, and margarines.   - Limit sugar intake, and avoid sugary beverages (soda, lemonade, juice, sweet tea)      Meal Planning Resources   - Eating Well ironjet.at  - Eat This Much (meal planner) caninetags.co.uk  - Work Week Lunch (meal prep and planner) mealfixer.dk

## 2024-07-06 ENCOUNTER — Ambulatory Visit: Payer: Self-pay | Admitting: FAMILY PRACTICE

## 2024-07-06 ENCOUNTER — Ambulatory Visit

## 2024-07-06 ENCOUNTER — Other Ambulatory Visit: Payer: Self-pay | Admitting: FAMILY PRACTICE

## 2024-07-06 ENCOUNTER — Ambulatory Visit
Admission: RE | Admit: 2024-07-06 | Discharge: 2024-07-06 | Disposition: A | Discharge status: Home / Self Care | Source: Ambulatory Visit | Attending: FAMILY PRACTICE | Admitting: FAMILY PRACTICE

## 2024-07-06 ENCOUNTER — Ambulatory Visit: Admitting: Audiologist-Hearing Aid Fitter

## 2024-07-06 DIAGNOSIS — M25552 Pain in left hip: Secondary | ICD-10-CM

## 2024-07-06 DIAGNOSIS — H903 Sensorineural hearing loss, bilateral: Secondary | ICD-10-CM

## 2024-07-06 DIAGNOSIS — R7989 Other specified abnormal findings of blood chemistry: Secondary | ICD-10-CM

## 2024-07-06 LAB — HEPATIC FUNCTION PANEL
Alanine Transferase (ALT): 29 U/L (ref <=33)
Albumin: 4.4 g/dL (ref 4.0–4.9)
Alkaline Phosphatase (ALP): 142 U/L — ABNORMAL HIGH (ref 35–129)
Aspartate Transaminase (AST): 24 U/L (ref <=41)
Bilirubin Direct: 0.1 mg/dL (ref 0.0–0.2)
Bilirubin Total: 0.5 mg/dL (ref <=1.2)
Protein: 7.4 g/dL (ref 6.6–8.7)

## 2024-07-06 NOTE — Procedures (Signed)
 Audiologic  Evaluation    History: Amanda Lester, a 72yr female, was seen today for an initial audiologic evaluation. Patient was referred by Dr. Roxan due to decreased hearing in both ears, with BPPV.   Today, patient reports periodic episodes of brief room spinning vertigo when lying down and turning quickly, left hearing loss, and a ticking sound in her right ear.    Patient denies otalgia and otorrhea.    Otoscopy:   R- clear ear canal, normal appearing TM.  L- clear ear canal, normal appearing TM.     Tympanometry:   R- type A tympanogram consistent with normal middle ear compliance.   L-  type A tympanogram consistent with normal middle ear compliance.        Audiogram:   Pure tone air and bone conduction testing indicated:  R- hearing within normal limits from 250-500 Hz, and mild to moderately-severe sensorineural hearing loss from [phone] .   L- hearing within normal limits from [phone] Hz, and mild to moderately-severe sensorineural hearing loss from 1500-8000 Hz.      Speech testing indicated:  R- SRT: 25 dB HL; WRS: 90% at 70 dB  L- SRT: 25 dB HL; WRS: 100% at 70 dB    Impressions:   Testing today revealed a mild to moderately-severe sensorineural hearing loss in the mid to high frequencies in both ears.   Tympanometry was consistent with normal TM compliance on the right and was consistent with normal TM compliance on the left.  Patient was counseled on today's results, the effects of their hearing loss, and the benefit of hearing aids.    Recommendations:  Results were reviewed with the patient and the following is recommended.     1) Patient will be scheduled for a hearing aid evaluation.    2) Return for another hearing evaluation in 1 year or sooner if needed.    3) Patient may request referral to ENT from PCP if the BPPV/vertigo issue becomes more frequent or does not resolve.    A copy of today's test was provided to patient. See scanned audiogram in EMR.    Report Electronically Signed  By  Massie Cork, AuD  Senior Audiologist  ENT-Otolaryngology   [phone]

## 2024-07-13 ENCOUNTER — Ambulatory Visit: Admitting: Sports Medicine

## 2024-07-13 VITALS — BP 124/71 | HR 80 | Temp 98.4°F | Ht 65.0 in | Wt 159.6 lb

## 2024-07-13 DIAGNOSIS — M67951 Unspecified disorder of synovium and tendon, right thigh: Secondary | ICD-10-CM

## 2024-07-13 DIAGNOSIS — M160 Bilateral primary osteoarthritis of hip: Secondary | ICD-10-CM

## 2024-07-13 DIAGNOSIS — M67952 Unspecified disorder of synovium and tendon, left thigh: Secondary | ICD-10-CM

## 2024-07-13 NOTE — Patient Instructions (Signed)
 [facility] Physical Therapy  Phone: [phone]    [facility] located inside [facility] [employer]  [address] [place], [place] [address]    [facility] located inside [employer]  [address] [place], [place] [address]    [facility] located inside [facility] [employer]  [address] [place], [place] [address]    [facility] [place]:  [address], [place], [place] [address] #    Lesli Albee located inside [employer]  [address] [place], [place] [address]    [facility] located inside [facility] [employer] [place]  [address], [place], [place] [address]    [facility] located inside [address], [place] [facility]  [address] [place], [place] [address]    [facility] located inside [employer]  [address] [place], [place] [address]

## 2024-07-13 NOTE — Progress Notes (Signed)
 Subjective     Amanda Lester 32yr female    Chief Complaint   Patient presents with    Consultation     Left hip       HPI    69yo F presents with bilateral hip pain, L>R, chronic in nature, no acute event, pain is lateral and anterior, worse getting up from a seated position and prolonged walking, pain is general 2-3/10 and 4-5/10 at its worst. Has done PT with Newtonia Nicholaus and Jacol[facility] in the past.    Past Medical History[1]    Family History[2]    Past Surgical History[3]    Social History[4]    Medications Ordered Prior to Encounter[5]    ALLERGIES  No Known Allergies    Review of Systems   Constitutional: Negative.    Neurological: Negative.      Objective   Physical Exam  Constitutional:       Appearance: Normal appearance. Patient is not ill-appearing.   Neurological:      Mental Status: Patient is alert.   Psychiatric:         Mood and Affect: Mood normal.         Behavior: Behavior normal.     Physical Exam    BILATERAL HIPS  TTP insertion of the gluteus medius tendons  Lateral pain with FADER, FADER-R  Anterior pain with FADIR, Scour  DNVI    IMAGING 07/06/2024  2v XR of the left hip with mild to moderate FAJ OA of the B hips. I personally reviewed and independently interpreted the images.      Assessment & Plan   Diagnoses and all orders for this visit:  Arthritis of both hips  -     Physical Therapy (External - Outside [place]) Referral  Tendinopathy of right gluteus medius  -     Physical Therapy (External - Outside [place]) Referral  Tendinopathy of left gluteus medius  -     Physical Therapy (External - Outside ) Referral  Other orders  -     Adult Sports Medicine-[facility] Referral (Non-Operative)    Discussed conditions and treatment options and progression, including PT at [facility], role of CSIs, progression to Tenex and RFA, and finally THA. Patient will work with [facility] and consider [facility] depending on how she feels with their approach    MDM: exacerbation of chronic condition, images reviewed and interpreted,  anticipate additional encounters for this condition over the next year      Toribio C. Alexa MD, PhD  Sports Medicine  Physical Medicine and Rehabilitation         [1]   Past Medical History:  Diagnosis Date    Arthritis    [2]   Family History  Problem Relation Name Age of Onset    Alcohol Abuse Mother      Asthma Mother      Cancer Mother  71        esophageal    Thyroid  Mother          Details unk    Alcohol/Drug Mother      No Known Problems Father          does not knoiw her father   [3]   Past Surgical History:  Procedure Laterality Date    FLEXIBLE SIGMOIDOSCOPY  01/06/2023    PR CESAREAN DELIVERY ONLY  1974    C-section, low cervical    PR COLONOSCOPY FLX W/ENDOSCOPIC MUCOSAL RESECTION  01/21/2019  PR COLONOSCOPY FLX W/ENDOSCOPIC MUCOSAL RESECTION  01/24/2019        [4]   Social History  Tobacco Use    Smoking status: Former     Current packs/day: 0.00     Average packs/day: 1 pack/day for 13.0 years (13.0 ttl pk-yrs)     Types: Cigarettes     Start date: 2001     Quit date: 2014     Years since quitting: 12.1    Smokeless tobacco: Never   Substance Use Topics    Alcohol use: Not Currently     Comment: 2 a month    Drug use: Never   [5]   Current Outpatient Medications on File Prior to Visit   Medication Sig Dispense Refill    acetaminophen 325 mg Capsule Take by mouth.      atorvastatin  (Lipitor) 40 mg tablet Take 1 tablet by mouth every day. (cholesterol) 30 tablet 12    busPIRone  (Buspar ) 5 mg tablet Take 1 tablet by mouth 3 times daily. 90 tablet 11    clonazePAM  (KlonoPIN ) 0.5 mg tablet Take 0.5 tablets by mouth two times daily if needed (anxiety/insomnia). Indications: Generalized anxiety 15 tablet 0    Ibuprofen (ADVIL) 200 mg Tablet Take 1 tablet by mouth every 6 hours if needed. take with food      meclizine  (Antivert /25) 25 mg tablet Take 1 tablet by mouth three times daily if needed for Dizziness. 30 tablet 0    Olopatadine  (PATADAY ) 0.2 % Drops Instill 1 drop into the affected eye(s) every  day. 2.5 mL 0     No current facility-administered medications on file prior to visit.

## 2024-07-13 NOTE — Nursing Note (Signed)
 Vital signs taken, screened for pain, allergies, tobacco hx and pharmacy reviewed.     Priscille Heidelberg, MA

## 2024-07-15 ENCOUNTER — Ambulatory Visit

## 2024-07-18 ENCOUNTER — Ambulatory Visit

## 2024-07-26 ENCOUNTER — Ambulatory Visit: Admitting: Audiologist-Hearing Aid Fitter

## 2024-08-24 ENCOUNTER — Ambulatory Visit: Admitting: Registered"

## 2024-09-13 ENCOUNTER — Ambulatory Visit: Admitting: FAMILY PRACTICE

## 2024-09-13 ENCOUNTER — Ambulatory Visit

## 2024-09-20 ENCOUNTER — Ambulatory Visit
